# Patient Record
Sex: Female | Born: 1940 | Race: White | Hispanic: No | Smoking: Former smoker
Health system: Southern US, Community
[De-identification: ages and names within clinical notes are randomized; demographics above are authoritative.]

## PROBLEM LIST (undated history)

## (undated) DIAGNOSIS — S8490XA Injury of unspecified nerve at lower leg level, unspecified leg, initial encounter: Secondary | ICD-10-CM

## (undated) DIAGNOSIS — S129XXA Fracture of neck, unspecified, initial encounter: Secondary | ICD-10-CM

## (undated) DIAGNOSIS — M899 Disorder of bone, unspecified: Secondary | ICD-10-CM

## (undated) DIAGNOSIS — M75102 Unspecified rotator cuff tear or rupture of left shoulder, not specified as traumatic: Secondary | ICD-10-CM

## (undated) DIAGNOSIS — M1990 Unspecified osteoarthritis, unspecified site: Secondary | ICD-10-CM

## (undated) DIAGNOSIS — M949 Disorder of cartilage, unspecified: Secondary | ICD-10-CM

## (undated) DIAGNOSIS — S2239XA Fracture of one rib, unspecified side, initial encounter for closed fracture: Secondary | ICD-10-CM

## (undated) DIAGNOSIS — E785 Hyperlipidemia, unspecified: Secondary | ICD-10-CM

## (undated) DIAGNOSIS — Z86010 Personal history of colonic polyps: Secondary | ICD-10-CM

## (undated) DIAGNOSIS — N3010 Interstitial cystitis (chronic) without hematuria: Secondary | ICD-10-CM

## (undated) DIAGNOSIS — I609 Nontraumatic subarachnoid hemorrhage, unspecified: Secondary | ICD-10-CM

## (undated) DIAGNOSIS — S329XXA Fracture of unspecified parts of lumbosacral spine and pelvis, initial encounter for closed fracture: Secondary | ICD-10-CM

## (undated) DIAGNOSIS — F32A Depression, unspecified: Secondary | ICD-10-CM

## (undated) DIAGNOSIS — Z8669 Personal history of other diseases of the nervous system and sense organs: Secondary | ICD-10-CM

## (undated) DIAGNOSIS — K219 Gastro-esophageal reflux disease without esophagitis: Secondary | ICD-10-CM

## (undated) DIAGNOSIS — G4733 Obstructive sleep apnea (adult) (pediatric): Secondary | ICD-10-CM

## (undated) DIAGNOSIS — M12812 Other specific arthropathies, not elsewhere classified, left shoulder: Secondary | ICD-10-CM

## (undated) DIAGNOSIS — I739 Peripheral vascular disease, unspecified: Secondary | ICD-10-CM

## (undated) DIAGNOSIS — I10 Essential (primary) hypertension: Secondary | ICD-10-CM

## (undated) DIAGNOSIS — F5104 Psychophysiologic insomnia: Secondary | ICD-10-CM

## (undated) DIAGNOSIS — J309 Allergic rhinitis, unspecified: Secondary | ICD-10-CM

## (undated) HISTORY — DX: Injury of unspecified nerve at lower leg level, unspecified leg, initial encounter: S84.90XA

## (undated) HISTORY — PX: BACK SURGERY: SHX140

## (undated) HISTORY — DX: Hyperlipidemia, unspecified: E78.5

## (undated) HISTORY — PX: LUMBAR FUSION: SHX111

## (undated) HISTORY — DX: Unspecified osteoarthritis, unspecified site: M19.90

## (undated) HISTORY — PX: TONSILLECTOMY: SUR1361

## (undated) HISTORY — PX: ANKLE FUSION: SHX881

## (undated) HISTORY — DX: Fracture of one rib, unspecified side, initial encounter for closed fracture: S22.39XA

## (undated) HISTORY — DX: Psychophysiologic insomnia: F51.04

## (undated) HISTORY — DX: Allergic rhinitis, unspecified: J30.9

## (undated) HISTORY — DX: Essential (primary) hypertension: I10

## (undated) HISTORY — PX: TIBIA FRACTURE SURGERY: SHX806

## (undated) HISTORY — DX: Fracture of unspecified parts of lumbosacral spine and pelvis, initial encounter for closed fracture: S32.9XXA

## (undated) HISTORY — DX: Disorder of bone, unspecified: M89.9

## (undated) HISTORY — DX: Peripheral vascular disease, unspecified: I73.9

## (undated) HISTORY — DX: Personal history of other diseases of the nervous system and sense organs: Z86.69

## (undated) HISTORY — PX: HERNIA REPAIR: SHX51

## (undated) HISTORY — DX: Personal history of colonic polyps: Z86.010

## (undated) HISTORY — DX: Gastro-esophageal reflux disease without esophagitis: K21.9

## (undated) HISTORY — PX: CYSTOSCOPY: SUR368

## (undated) HISTORY — PX: CERVICAL FUSION: SHX112

## (undated) HISTORY — PX: UPPER GASTROINTESTINAL ENDOSCOPY: SHX188

## (undated) HISTORY — DX: Disorder of cartilage, unspecified: M94.9

## (undated) HISTORY — PX: COLONOSCOPY: SHX174

## (undated) HISTORY — DX: Interstitial cystitis (chronic) without hematuria: N30.10

## (undated) HISTORY — PX: TUBAL LIGATION: SHX77

## (undated) HISTORY — DX: Obstructive sleep apnea (adult) (pediatric): G47.33

---

## 1997-07-15 ENCOUNTER — Ambulatory Visit (HOSPITAL_COMMUNITY): Admission: RE | Admit: 1997-07-15 | Discharge: 1997-07-15 | Payer: Self-pay | Admitting: *Deleted

## 1998-01-11 ENCOUNTER — Ambulatory Visit (HOSPITAL_COMMUNITY): Admission: RE | Admit: 1998-01-11 | Discharge: 1998-01-11 | Payer: Self-pay | Admitting: *Deleted

## 1998-10-27 ENCOUNTER — Other Ambulatory Visit: Admission: RE | Admit: 1998-10-27 | Discharge: 1998-10-27 | Payer: Self-pay | Admitting: *Deleted

## 2000-04-18 ENCOUNTER — Other Ambulatory Visit: Admission: RE | Admit: 2000-04-18 | Discharge: 2000-04-18 | Payer: Self-pay | Admitting: *Deleted

## 2002-01-07 ENCOUNTER — Ambulatory Visit (HOSPITAL_BASED_OUTPATIENT_CLINIC_OR_DEPARTMENT_OTHER): Admission: RE | Admit: 2002-01-07 | Discharge: 2002-01-07 | Payer: Self-pay | Admitting: Internal Medicine

## 2002-02-12 ENCOUNTER — Inpatient Hospital Stay (HOSPITAL_COMMUNITY): Admission: EM | Admit: 2002-02-12 | Discharge: 2002-02-16 | Payer: Self-pay | Admitting: Emergency Medicine

## 2002-02-13 ENCOUNTER — Encounter: Payer: Self-pay | Admitting: Orthopedic Surgery

## 2002-10-01 ENCOUNTER — Ambulatory Visit (HOSPITAL_COMMUNITY): Admission: RE | Admit: 2002-10-01 | Discharge: 2002-10-01 | Payer: Self-pay | Admitting: Gastroenterology

## 2003-01-12 ENCOUNTER — Encounter: Admission: RE | Admit: 2003-01-12 | Discharge: 2003-01-12 | Payer: Self-pay | Admitting: Urology

## 2003-01-15 ENCOUNTER — Encounter (INDEPENDENT_AMBULATORY_CARE_PROVIDER_SITE_OTHER): Payer: Self-pay | Admitting: Specialist

## 2003-01-15 ENCOUNTER — Ambulatory Visit (HOSPITAL_BASED_OUTPATIENT_CLINIC_OR_DEPARTMENT_OTHER): Admission: RE | Admit: 2003-01-15 | Discharge: 2003-01-15 | Payer: Self-pay | Admitting: Urology

## 2003-01-15 ENCOUNTER — Ambulatory Visit (HOSPITAL_COMMUNITY): Admission: RE | Admit: 2003-01-15 | Discharge: 2003-01-15 | Payer: Self-pay | Admitting: Urology

## 2003-02-03 ENCOUNTER — Encounter: Payer: Self-pay | Admitting: Internal Medicine

## 2003-11-15 ENCOUNTER — Encounter (INDEPENDENT_AMBULATORY_CARE_PROVIDER_SITE_OTHER): Payer: Self-pay | Admitting: *Deleted

## 2004-01-03 ENCOUNTER — Ambulatory Visit: Payer: Self-pay | Admitting: Internal Medicine

## 2004-01-04 ENCOUNTER — Ambulatory Visit: Payer: Self-pay | Admitting: Internal Medicine

## 2004-02-25 ENCOUNTER — Ambulatory Visit: Payer: Self-pay | Admitting: Internal Medicine

## 2004-04-11 ENCOUNTER — Ambulatory Visit: Payer: Self-pay | Admitting: Internal Medicine

## 2004-07-13 ENCOUNTER — Ambulatory Visit: Payer: Self-pay | Admitting: Internal Medicine

## 2004-07-14 ENCOUNTER — Ambulatory Visit: Payer: Self-pay | Admitting: Internal Medicine

## 2004-09-22 ENCOUNTER — Inpatient Hospital Stay (HOSPITAL_COMMUNITY): Admission: EM | Admit: 2004-09-22 | Discharge: 2004-09-30 | Payer: Self-pay | Admitting: Emergency Medicine

## 2004-09-25 ENCOUNTER — Ambulatory Visit: Payer: Self-pay | Admitting: Internal Medicine

## 2004-09-30 ENCOUNTER — Inpatient Hospital Stay: Admission: RE | Admit: 2004-09-30 | Discharge: 2004-10-06 | Payer: Self-pay | Admitting: Internal Medicine

## 2004-10-18 ENCOUNTER — Ambulatory Visit: Payer: Self-pay | Admitting: Internal Medicine

## 2004-10-30 ENCOUNTER — Ambulatory Visit: Payer: Self-pay | Admitting: Internal Medicine

## 2004-11-15 ENCOUNTER — Ambulatory Visit: Payer: Self-pay | Admitting: Internal Medicine

## 2004-11-24 ENCOUNTER — Ambulatory Visit: Payer: Self-pay | Admitting: Internal Medicine

## 2005-03-02 ENCOUNTER — Ambulatory Visit: Payer: Self-pay | Admitting: Internal Medicine

## 2005-06-13 LAB — HM MAMMOGRAPHY: HM Mammogram: NORMAL

## 2005-06-21 ENCOUNTER — Ambulatory Visit: Payer: Self-pay | Admitting: Internal Medicine

## 2005-07-12 ENCOUNTER — Ambulatory Visit: Payer: Self-pay | Admitting: Internal Medicine

## 2005-08-14 ENCOUNTER — Inpatient Hospital Stay (HOSPITAL_COMMUNITY): Admission: EM | Admit: 2005-08-14 | Discharge: 2005-08-20 | Payer: Self-pay | Admitting: Emergency Medicine

## 2005-08-14 IMAGING — CR DG KNEE COMPLETE 4+V*L*
4 series · 4 of 4 positions shown · non-contrast
Comparison: None.

[DATE] ? DUPLICATE COPY for exam association in RIS ? No change from original report.
CLINICAL DATA: Fell ? right shoulder pain.
 CHEST ? 1 VIEW:
CLINICAL DATA: Fell ? bruising of foot and toes.
CLINICAL DATA: Fell ? pain and swelling medial knee.

[t knee ap left]
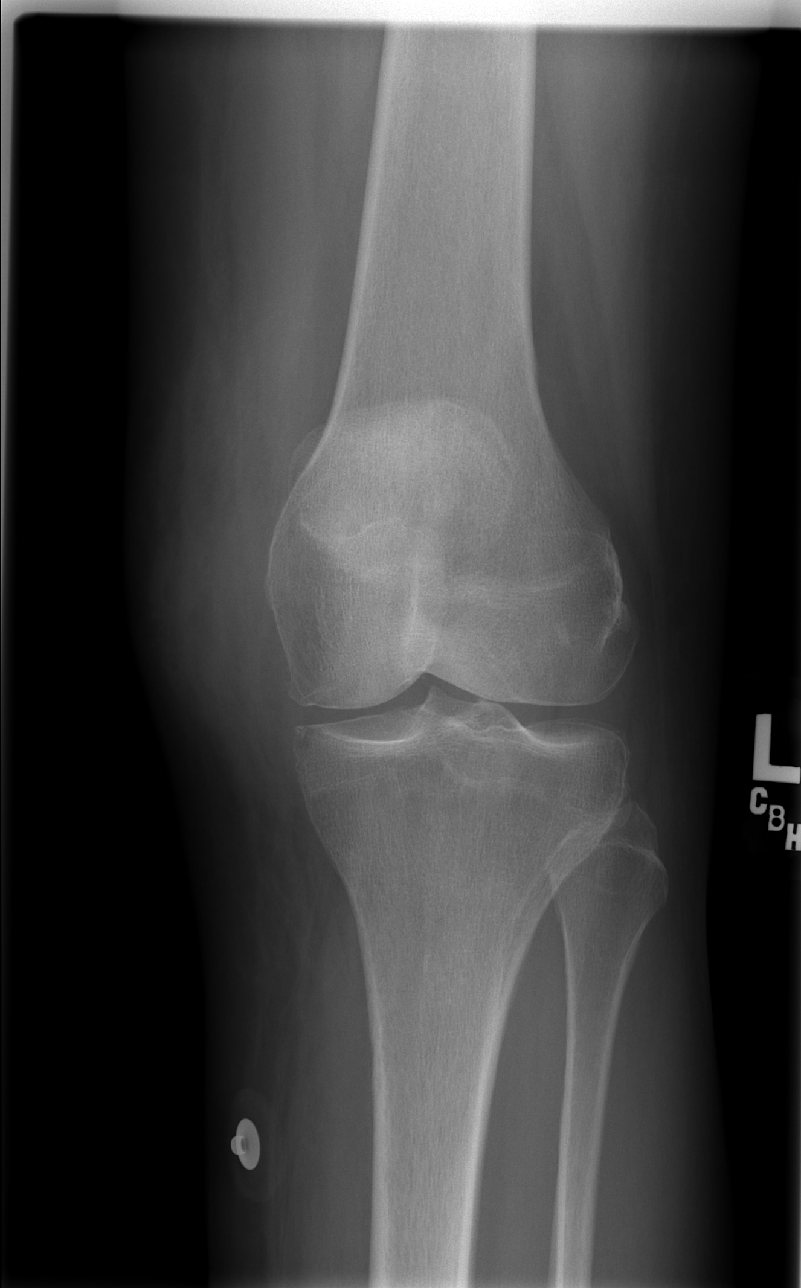

[t knee oblique left (1 of 2)]
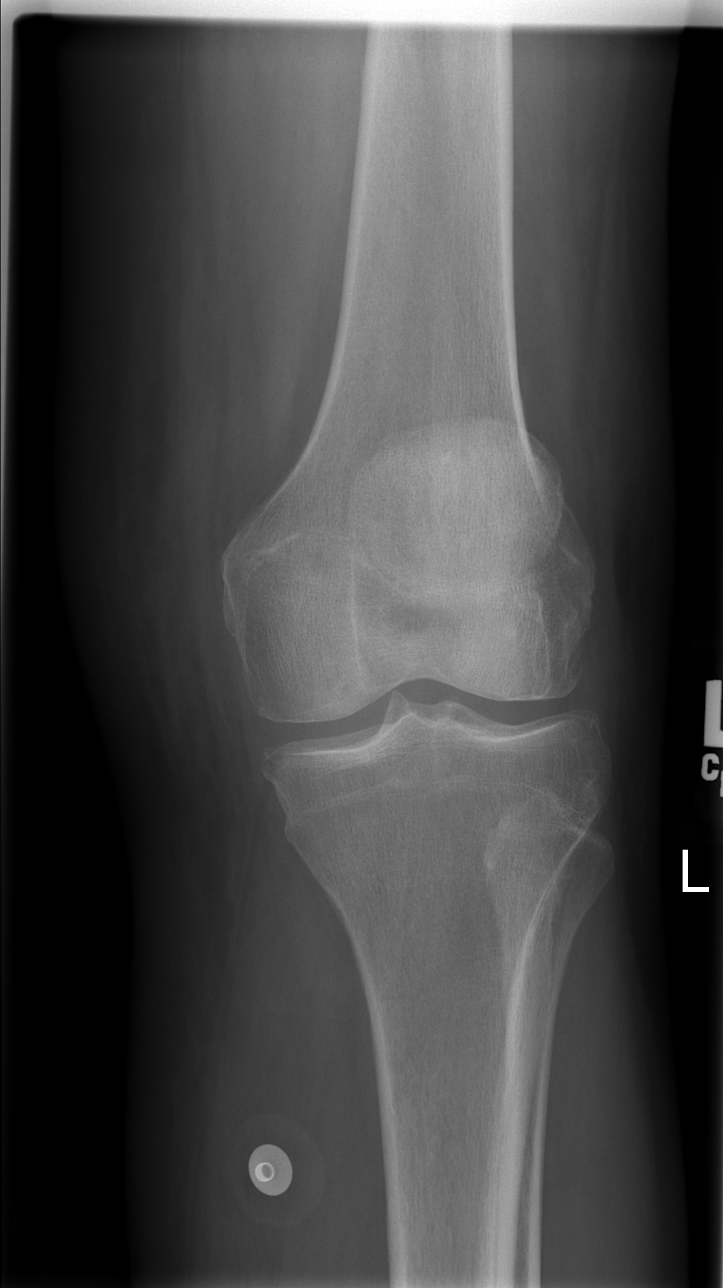

[t knee oblique left (2 of 2)]
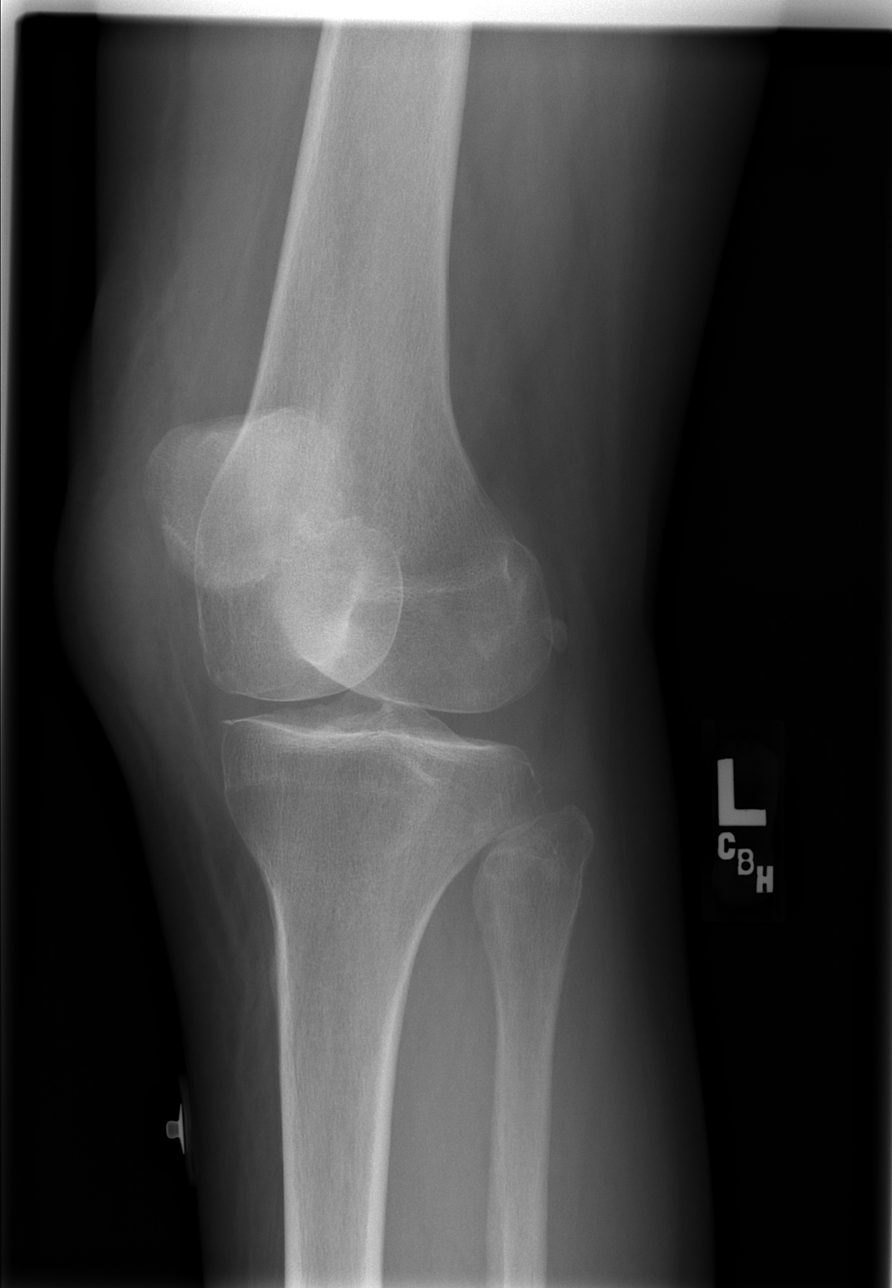

[t knee lat left]
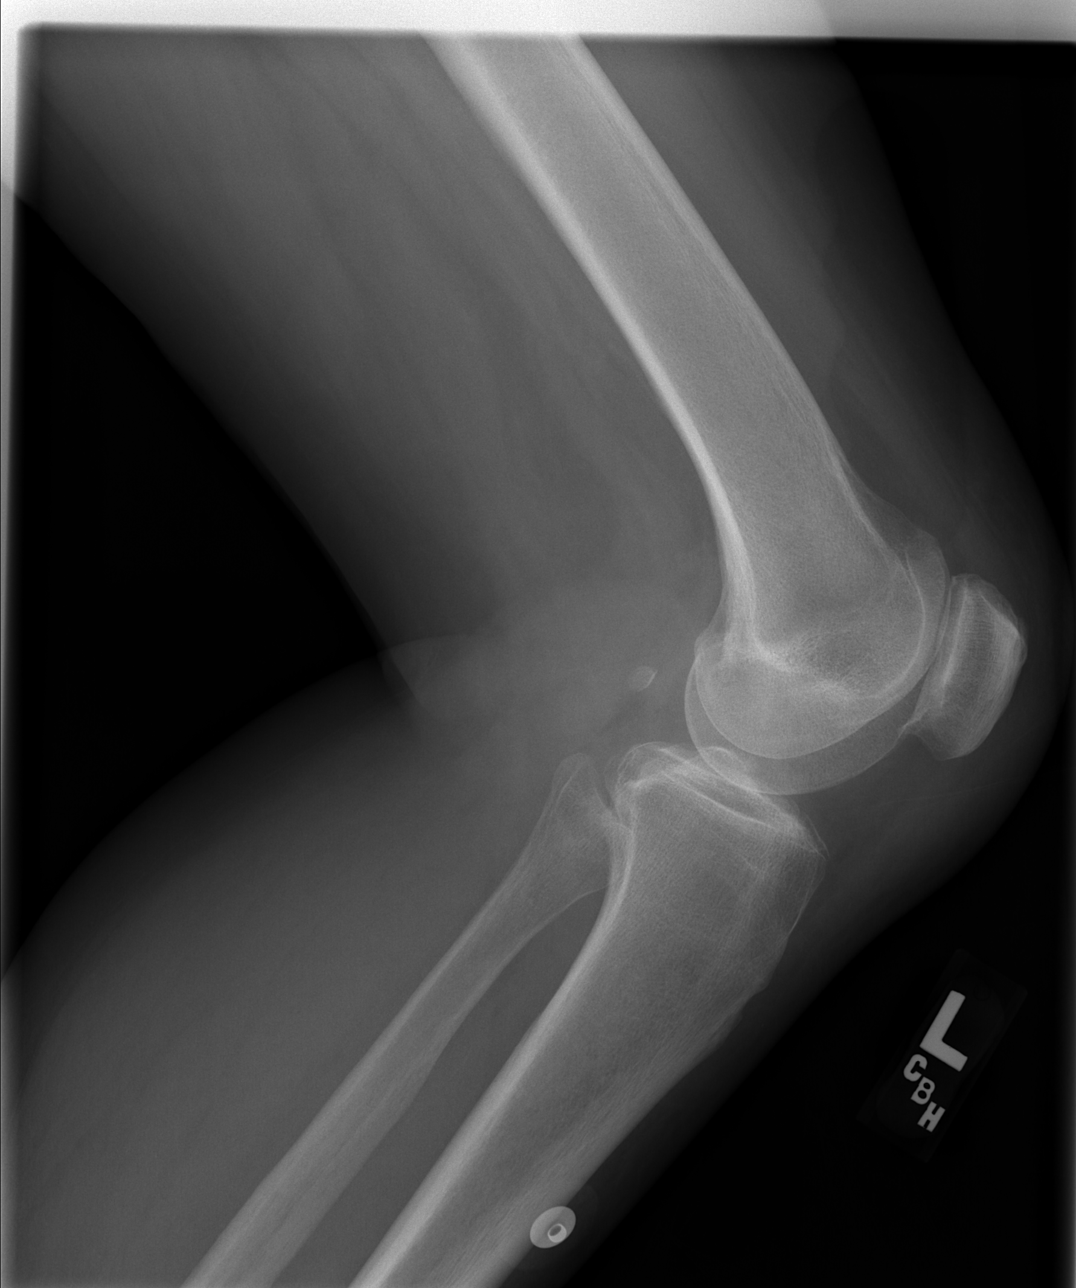

[4 of 4 positions shown; findings below may reference images not displayed]

FINDINGS: Heart and mediastinum normal.  There are nondisplaced rib fractures on the right of the lateral fourth and fifth ribs.  There is also a fracture of the right anterior fifth rib.  No definite pneumothorax or hemothorax.
IMPRESSION: Nondisplaced right rib fractures ? no pneumothorax or active disease.
 RIGHT FOOT ? 3 VIEW:
FINDINGS: There is a fracture of the terminal tuft of the distal phalanx of the great toe.  No other acute changes.  There are some degenerative changes.
IMPRESSION: Fracture of the tuft of the great toe.  
 LEFT KNEE - 4 VIEW:
FINDINGS: There is some soft tissue swelling medially.  No definite fracture, dislocation, or joint effusion.  
 There is, however, an area of cortical discontinuity along the medial tibial plateau anterolaterally.  Cannot rule out an occult fracture, although I think it is unlikely because there is no joint effusion.  However, this is near the area where there is soft tissue swelling. 
 This needs careful correlation.  CT might be necessary if further assessment is warranted.
IMPRESSION: 1.  Medial soft tissue swelling.  
 2.  No definite fracture, but there is an area of cortical discontinuity along the anteromedial tibial plateau.  See above.

## 2005-08-16 ENCOUNTER — Ambulatory Visit: Payer: Self-pay | Admitting: Physical Medicine & Rehabilitation

## 2005-09-14 ENCOUNTER — Ambulatory Visit: Payer: Self-pay | Admitting: Internal Medicine

## 2005-09-28 ENCOUNTER — Ambulatory Visit: Payer: Self-pay | Admitting: Internal Medicine

## 2005-10-04 ENCOUNTER — Ambulatory Visit: Payer: Self-pay | Admitting: Internal Medicine

## 2005-10-18 ENCOUNTER — Ambulatory Visit: Payer: Self-pay | Admitting: Internal Medicine

## 2005-12-25 ENCOUNTER — Ambulatory Visit: Payer: Self-pay | Admitting: Family Medicine

## 2005-12-25 LAB — CONVERTED CEMR LAB
ALT: 26 units/L (ref 0–40)
AST: 30 units/L (ref 0–37)
BUN: 14 mg/dL (ref 6–23)
Creatinine, Ser: 1 mg/dL (ref 0.4–1.2)
VLDL: 8 mg/dL (ref 0–40)

## 2006-04-02 ENCOUNTER — Ambulatory Visit: Payer: Self-pay | Admitting: Internal Medicine

## 2006-04-02 LAB — CONVERTED CEMR LAB
BUN: 10 mg/dL (ref 6–23)
Potassium: 4.4 meq/L (ref 3.5–5.1)
Total CK: 40 units/L (ref 7–177)

## 2006-04-03 ENCOUNTER — Encounter: Payer: Self-pay | Admitting: Internal Medicine

## 2006-04-03 LAB — CONVERTED CEMR LAB: Vit D, 1,25-Dihydroxy: 47 (ref 20–57)

## 2006-04-10 ENCOUNTER — Ambulatory Visit: Payer: Self-pay | Admitting: Internal Medicine

## 2006-06-13 ENCOUNTER — Ambulatory Visit: Payer: Self-pay | Admitting: Internal Medicine

## 2006-07-01 DIAGNOSIS — I10 Essential (primary) hypertension: Secondary | ICD-10-CM | POA: Insufficient documentation

## 2006-07-01 DIAGNOSIS — J3089 Other allergic rhinitis: Secondary | ICD-10-CM | POA: Insufficient documentation

## 2006-07-01 DIAGNOSIS — J302 Other seasonal allergic rhinitis: Secondary | ICD-10-CM

## 2006-07-01 DIAGNOSIS — Z8669 Personal history of other diseases of the nervous system and sense organs: Secondary | ICD-10-CM | POA: Insufficient documentation

## 2006-07-01 DIAGNOSIS — M949 Disorder of cartilage, unspecified: Secondary | ICD-10-CM

## 2006-07-01 DIAGNOSIS — Z9089 Acquired absence of other organs: Secondary | ICD-10-CM | POA: Insufficient documentation

## 2006-07-01 DIAGNOSIS — S329XXA Fracture of unspecified parts of lumbosacral spine and pelvis, initial encounter for closed fracture: Secondary | ICD-10-CM | POA: Insufficient documentation

## 2006-07-01 DIAGNOSIS — M899 Disorder of bone, unspecified: Secondary | ICD-10-CM | POA: Insufficient documentation

## 2006-07-01 DIAGNOSIS — N3010 Interstitial cystitis (chronic) without hematuria: Secondary | ICD-10-CM | POA: Insufficient documentation

## 2006-08-12 ENCOUNTER — Telehealth: Payer: Self-pay | Admitting: Internal Medicine

## 2006-08-15 ENCOUNTER — Ambulatory Visit: Payer: Self-pay | Admitting: Internal Medicine

## 2006-08-16 ENCOUNTER — Inpatient Hospital Stay (HOSPITAL_COMMUNITY): Admission: EM | Admit: 2006-08-16 | Discharge: 2006-08-21 | Payer: Self-pay | Admitting: Emergency Medicine

## 2006-08-19 ENCOUNTER — Ambulatory Visit: Payer: Self-pay | Admitting: Physical Medicine & Rehabilitation

## 2006-09-09 ENCOUNTER — Encounter: Payer: Self-pay | Admitting: Internal Medicine

## 2006-10-08 ENCOUNTER — Encounter: Admission: RE | Admit: 2006-10-08 | Discharge: 2006-12-04 | Payer: Self-pay | Admitting: Orthopedic Surgery

## 2006-11-26 ENCOUNTER — Ambulatory Visit: Payer: Self-pay | Admitting: Internal Medicine

## 2006-12-02 ENCOUNTER — Encounter (INDEPENDENT_AMBULATORY_CARE_PROVIDER_SITE_OTHER): Payer: Self-pay | Admitting: *Deleted

## 2006-12-09 ENCOUNTER — Ambulatory Visit: Payer: Self-pay | Admitting: Internal Medicine

## 2007-01-07 ENCOUNTER — Telehealth (INDEPENDENT_AMBULATORY_CARE_PROVIDER_SITE_OTHER): Payer: Self-pay | Admitting: *Deleted

## 2007-01-13 ENCOUNTER — Telehealth (INDEPENDENT_AMBULATORY_CARE_PROVIDER_SITE_OTHER): Payer: Self-pay | Admitting: *Deleted

## 2007-01-15 ENCOUNTER — Encounter (INDEPENDENT_AMBULATORY_CARE_PROVIDER_SITE_OTHER): Payer: Self-pay | Admitting: *Deleted

## 2007-01-15 DIAGNOSIS — M1990 Unspecified osteoarthritis, unspecified site: Secondary | ICD-10-CM | POA: Insufficient documentation

## 2007-01-15 DIAGNOSIS — K219 Gastro-esophageal reflux disease without esophagitis: Secondary | ICD-10-CM | POA: Insufficient documentation

## 2007-01-24 ENCOUNTER — Telehealth (INDEPENDENT_AMBULATORY_CARE_PROVIDER_SITE_OTHER): Payer: Self-pay | Admitting: *Deleted

## 2007-02-18 ENCOUNTER — Telehealth (INDEPENDENT_AMBULATORY_CARE_PROVIDER_SITE_OTHER): Payer: Self-pay | Admitting: *Deleted

## 2007-02-18 ENCOUNTER — Encounter: Payer: Self-pay | Admitting: Internal Medicine

## 2007-02-26 ENCOUNTER — Telehealth (INDEPENDENT_AMBULATORY_CARE_PROVIDER_SITE_OTHER): Payer: Self-pay | Admitting: *Deleted

## 2007-03-04 ENCOUNTER — Telehealth (INDEPENDENT_AMBULATORY_CARE_PROVIDER_SITE_OTHER): Payer: Self-pay | Admitting: *Deleted

## 2007-03-12 ENCOUNTER — Ambulatory Visit: Payer: Self-pay | Admitting: Internal Medicine

## 2007-04-15 ENCOUNTER — Telehealth (INDEPENDENT_AMBULATORY_CARE_PROVIDER_SITE_OTHER): Payer: Self-pay | Admitting: *Deleted

## 2007-05-20 ENCOUNTER — Telehealth (INDEPENDENT_AMBULATORY_CARE_PROVIDER_SITE_OTHER): Payer: Self-pay | Admitting: *Deleted

## 2007-05-27 ENCOUNTER — Telehealth (INDEPENDENT_AMBULATORY_CARE_PROVIDER_SITE_OTHER): Payer: Self-pay | Admitting: *Deleted

## 2007-06-09 ENCOUNTER — Telehealth: Payer: Self-pay | Admitting: Internal Medicine

## 2007-06-11 ENCOUNTER — Telehealth (INDEPENDENT_AMBULATORY_CARE_PROVIDER_SITE_OTHER): Payer: Self-pay | Admitting: *Deleted

## 2007-06-12 ENCOUNTER — Telehealth (INDEPENDENT_AMBULATORY_CARE_PROVIDER_SITE_OTHER): Payer: Self-pay | Admitting: *Deleted

## 2007-06-13 ENCOUNTER — Ambulatory Visit: Payer: Self-pay | Admitting: Internal Medicine

## 2007-06-13 ENCOUNTER — Emergency Department (HOSPITAL_COMMUNITY): Admission: EM | Admit: 2007-06-13 | Discharge: 2007-06-13 | Payer: Self-pay | Admitting: Emergency Medicine

## 2007-06-23 ENCOUNTER — Ambulatory Visit: Payer: Self-pay | Admitting: Internal Medicine

## 2007-06-24 ENCOUNTER — Telehealth: Payer: Self-pay | Admitting: Internal Medicine

## 2007-06-25 LAB — CONVERTED CEMR LAB
Alkaline Phosphatase: 71 units/L (ref 39–117)
Basophils Absolute: 0 10*3/uL (ref 0.0–0.1)
Bilirubin, Direct: 0.1 mg/dL (ref 0.0–0.3)
Calcium: 9.3 mg/dL (ref 8.4–10.5)
Cholesterol: 183 mg/dL (ref 0–200)
GFR calc Af Amer: 108 mL/min
GFR calc non Af Amer: 89 mL/min
Glucose, Bld: 102 mg/dL — ABNORMAL HIGH (ref 70–99)
HCT: 36.9 % (ref 36.0–46.0)
HDL: 43.4 mg/dL (ref >39.0)
LDL Cholesterol: 127 mg/dL — ABNORMAL HIGH (ref 0–99)
Lymphocytes Relative: 33.7 % (ref 12.0–46.0)
MCHC: 33.2 g/dL (ref 30.0–36.0)
Monocytes Absolute: 0.4 10*3/uL (ref 0.1–1.0)
Monocytes Relative: 7.1 % (ref 3.0–12.0)
Platelets: 346 10*3/uL (ref 150–400)
Potassium: 4.1 meq/L (ref 3.5–5.1)
RDW: 12.4 % (ref 11.5–14.6)
Sodium: 138 meq/L (ref 135–145)
Total Bilirubin: 0.7 mg/dL (ref 0.3–1.2)
Total CHOL/HDL Ratio: 4.2
Total Protein: 6.5 g/dL (ref 6.0–8.3)
Triglycerides: 61 mg/dL (ref 0–149)

## 2007-06-26 ENCOUNTER — Encounter (INDEPENDENT_AMBULATORY_CARE_PROVIDER_SITE_OTHER): Payer: Self-pay | Admitting: *Deleted

## 2007-07-10 ENCOUNTER — Telehealth (INDEPENDENT_AMBULATORY_CARE_PROVIDER_SITE_OTHER): Payer: Self-pay | Admitting: *Deleted

## 2007-07-14 ENCOUNTER — Encounter: Payer: Self-pay | Admitting: Internal Medicine

## 2007-07-15 ENCOUNTER — Ambulatory Visit: Payer: Self-pay | Admitting: Internal Medicine

## 2007-07-15 DIAGNOSIS — G4700 Insomnia, unspecified: Secondary | ICD-10-CM | POA: Insufficient documentation

## 2007-07-16 ENCOUNTER — Ambulatory Visit: Payer: Self-pay | Admitting: Internal Medicine

## 2007-07-21 ENCOUNTER — Encounter: Payer: Self-pay | Admitting: Internal Medicine

## 2007-07-23 ENCOUNTER — Encounter: Payer: Self-pay | Admitting: Internal Medicine

## 2007-07-29 ENCOUNTER — Ambulatory Visit: Payer: Self-pay | Admitting: Internal Medicine

## 2007-07-29 DIAGNOSIS — R9431 Abnormal electrocardiogram [ECG] [EKG]: Secondary | ICD-10-CM | POA: Insufficient documentation

## 2007-07-29 DIAGNOSIS — R7989 Other specified abnormal findings of blood chemistry: Secondary | ICD-10-CM | POA: Insufficient documentation

## 2007-07-29 DIAGNOSIS — E785 Hyperlipidemia, unspecified: Secondary | ICD-10-CM | POA: Insufficient documentation

## 2007-08-12 ENCOUNTER — Encounter (INDEPENDENT_AMBULATORY_CARE_PROVIDER_SITE_OTHER): Payer: Self-pay | Admitting: *Deleted

## 2007-08-20 ENCOUNTER — Telehealth (INDEPENDENT_AMBULATORY_CARE_PROVIDER_SITE_OTHER): Payer: Self-pay | Admitting: *Deleted

## 2007-08-29 ENCOUNTER — Telehealth (INDEPENDENT_AMBULATORY_CARE_PROVIDER_SITE_OTHER): Payer: Self-pay | Admitting: *Deleted

## 2007-09-05 ENCOUNTER — Telehealth (INDEPENDENT_AMBULATORY_CARE_PROVIDER_SITE_OTHER): Payer: Self-pay | Admitting: *Deleted

## 2007-10-03 ENCOUNTER — Telehealth (INDEPENDENT_AMBULATORY_CARE_PROVIDER_SITE_OTHER): Payer: Self-pay | Admitting: *Deleted

## 2007-10-10 ENCOUNTER — Encounter: Payer: Self-pay | Admitting: Internal Medicine

## 2007-10-10 DIAGNOSIS — R269 Unspecified abnormalities of gait and mobility: Secondary | ICD-10-CM | POA: Insufficient documentation

## 2007-10-10 DIAGNOSIS — M6281 Muscle weakness (generalized): Secondary | ICD-10-CM | POA: Insufficient documentation

## 2007-11-10 ENCOUNTER — Telehealth (INDEPENDENT_AMBULATORY_CARE_PROVIDER_SITE_OTHER): Payer: Self-pay | Admitting: *Deleted

## 2007-11-19 ENCOUNTER — Encounter (INDEPENDENT_AMBULATORY_CARE_PROVIDER_SITE_OTHER): Payer: Self-pay | Admitting: *Deleted

## 2007-11-24 ENCOUNTER — Telehealth (INDEPENDENT_AMBULATORY_CARE_PROVIDER_SITE_OTHER): Payer: Self-pay | Admitting: *Deleted

## 2007-12-04 ENCOUNTER — Telehealth (INDEPENDENT_AMBULATORY_CARE_PROVIDER_SITE_OTHER): Payer: Self-pay | Admitting: *Deleted

## 2007-12-08 ENCOUNTER — Ambulatory Visit: Payer: Self-pay | Admitting: Internal Medicine

## 2008-01-01 ENCOUNTER — Telehealth: Payer: Self-pay | Admitting: Internal Medicine

## 2008-01-02 ENCOUNTER — Telehealth (INDEPENDENT_AMBULATORY_CARE_PROVIDER_SITE_OTHER): Payer: Self-pay | Admitting: *Deleted

## 2008-03-05 ENCOUNTER — Telehealth (INDEPENDENT_AMBULATORY_CARE_PROVIDER_SITE_OTHER): Payer: Self-pay | Admitting: *Deleted

## 2008-03-16 ENCOUNTER — Telehealth (INDEPENDENT_AMBULATORY_CARE_PROVIDER_SITE_OTHER): Payer: Self-pay | Admitting: *Deleted

## 2008-03-31 ENCOUNTER — Ambulatory Visit: Payer: Self-pay | Admitting: Internal Medicine

## 2008-05-17 ENCOUNTER — Telehealth: Payer: Self-pay | Admitting: Internal Medicine

## 2008-06-07 ENCOUNTER — Ambulatory Visit: Payer: Self-pay | Admitting: Family Medicine

## 2008-06-07 DIAGNOSIS — M549 Dorsalgia, unspecified: Secondary | ICD-10-CM | POA: Insufficient documentation

## 2008-06-07 LAB — CONVERTED CEMR LAB
Bilirubin Urine: NEGATIVE
Glucose, Urine, Semiquant: NEGATIVE
Ketones, urine, test strip: NEGATIVE
Specific Gravity, Urine: 1.015
pH: 6

## 2008-06-08 ENCOUNTER — Encounter: Payer: Self-pay | Admitting: Internal Medicine

## 2008-06-09 ENCOUNTER — Encounter: Payer: Self-pay | Admitting: Family Medicine

## 2008-06-09 LAB — CONVERTED CEMR LAB

## 2008-06-15 ENCOUNTER — Encounter (INDEPENDENT_AMBULATORY_CARE_PROVIDER_SITE_OTHER): Payer: Self-pay | Admitting: *Deleted

## 2008-06-18 ENCOUNTER — Encounter: Payer: Self-pay | Admitting: Internal Medicine

## 2008-06-23 ENCOUNTER — Encounter: Payer: Self-pay | Admitting: Family Medicine

## 2008-06-23 ENCOUNTER — Encounter: Payer: Self-pay | Admitting: Internal Medicine

## 2008-06-28 ENCOUNTER — Telehealth (INDEPENDENT_AMBULATORY_CARE_PROVIDER_SITE_OTHER): Payer: Self-pay | Admitting: *Deleted

## 2008-07-05 ENCOUNTER — Encounter: Payer: Self-pay | Admitting: Internal Medicine

## 2008-07-20 ENCOUNTER — Telehealth (INDEPENDENT_AMBULATORY_CARE_PROVIDER_SITE_OTHER): Payer: Self-pay | Admitting: *Deleted

## 2008-07-23 ENCOUNTER — Ambulatory Visit: Payer: Self-pay | Admitting: Internal Medicine

## 2008-08-03 ENCOUNTER — Encounter: Payer: Self-pay | Admitting: Internal Medicine

## 2008-08-03 ENCOUNTER — Telehealth (INDEPENDENT_AMBULATORY_CARE_PROVIDER_SITE_OTHER): Payer: Self-pay | Admitting: *Deleted

## 2008-08-12 ENCOUNTER — Telehealth (INDEPENDENT_AMBULATORY_CARE_PROVIDER_SITE_OTHER): Payer: Self-pay | Admitting: *Deleted

## 2008-08-24 ENCOUNTER — Telehealth (INDEPENDENT_AMBULATORY_CARE_PROVIDER_SITE_OTHER): Payer: Self-pay | Admitting: *Deleted

## 2008-08-26 ENCOUNTER — Ambulatory Visit: Payer: Self-pay | Admitting: Internal Medicine

## 2008-09-15 ENCOUNTER — Encounter: Payer: Self-pay | Admitting: Internal Medicine

## 2008-09-21 ENCOUNTER — Encounter: Payer: Self-pay | Admitting: Internal Medicine

## 2008-09-22 ENCOUNTER — Encounter: Payer: Self-pay | Admitting: Internal Medicine

## 2008-10-04 ENCOUNTER — Telehealth (INDEPENDENT_AMBULATORY_CARE_PROVIDER_SITE_OTHER): Payer: Self-pay | Admitting: *Deleted

## 2008-10-06 ENCOUNTER — Telehealth (INDEPENDENT_AMBULATORY_CARE_PROVIDER_SITE_OTHER): Payer: Self-pay | Admitting: *Deleted

## 2008-10-22 ENCOUNTER — Telehealth (INDEPENDENT_AMBULATORY_CARE_PROVIDER_SITE_OTHER): Payer: Self-pay | Admitting: *Deleted

## 2008-11-02 ENCOUNTER — Telehealth (INDEPENDENT_AMBULATORY_CARE_PROVIDER_SITE_OTHER): Payer: Self-pay | Admitting: *Deleted

## 2008-11-04 ENCOUNTER — Encounter: Payer: Self-pay | Admitting: Internal Medicine

## 2008-11-15 ENCOUNTER — Ambulatory Visit: Payer: Self-pay | Admitting: Internal Medicine

## 2008-11-24 ENCOUNTER — Telehealth (INDEPENDENT_AMBULATORY_CARE_PROVIDER_SITE_OTHER): Payer: Self-pay | Admitting: *Deleted

## 2008-11-24 ENCOUNTER — Encounter: Payer: Self-pay | Admitting: Internal Medicine

## 2008-11-29 ENCOUNTER — Telehealth (INDEPENDENT_AMBULATORY_CARE_PROVIDER_SITE_OTHER): Payer: Self-pay | Admitting: *Deleted

## 2008-12-01 ENCOUNTER — Encounter: Payer: Self-pay | Admitting: Internal Medicine

## 2008-12-14 ENCOUNTER — Encounter (INDEPENDENT_AMBULATORY_CARE_PROVIDER_SITE_OTHER): Payer: Self-pay | Admitting: *Deleted

## 2009-01-11 ENCOUNTER — Ambulatory Visit: Payer: Self-pay | Admitting: Internal Medicine

## 2009-01-18 ENCOUNTER — Encounter (INDEPENDENT_AMBULATORY_CARE_PROVIDER_SITE_OTHER): Payer: Self-pay | Admitting: *Deleted

## 2009-01-18 LAB — CONVERTED CEMR LAB
AST: 43 units/L — ABNORMAL HIGH (ref 0–37)
Albumin: 4.6 g/dL (ref 3.5–5.2)
Alkaline Phosphatase: 49 units/L (ref 39–117)
Basophils Relative: 1.2 % (ref 0.0–3.0)
CO2: 27 meq/L (ref 19–32)
Calcium: 9.6 mg/dL (ref 8.4–10.5)
Eosinophils Absolute: 0.1 10*3/uL (ref 0.0–0.7)
Glucose, Bld: 106 mg/dL — ABNORMAL HIGH (ref 70–99)
HCT: 39.5 % (ref 36.0–46.0)
Hemoglobin: 13.2 g/dL (ref 12.0–15.0)
Lymphocytes Relative: 41.1 % (ref 12.0–46.0)
Lymphs Abs: 1.9 10*3/uL (ref 0.7–4.0)
MCHC: 33.4 g/dL (ref 30.0–36.0)
Monocytes Relative: 16.4 % — ABNORMAL HIGH (ref 3.0–12.0)
Neutro Abs: 1.9 10*3/uL (ref 1.4–7.7)
Potassium: 4.1 meq/L (ref 3.5–5.1)
RBC: 4.09 M/uL (ref 3.87–5.11)
RDW: 13.5 % (ref 11.5–14.6)
Sodium: 133 meq/L — ABNORMAL LOW (ref 135–145)
TSH: 1.02 microintl units/mL (ref 0.35–5.50)
Total CHOL/HDL Ratio: 2
Total Protein: 7.1 g/dL (ref 6.0–8.3)
Triglycerides: 36 mg/dL (ref 0.0–149.0)

## 2009-01-19 ENCOUNTER — Encounter (INDEPENDENT_AMBULATORY_CARE_PROVIDER_SITE_OTHER): Payer: Self-pay | Admitting: *Deleted

## 2009-03-17 ENCOUNTER — Ambulatory Visit: Payer: Self-pay | Admitting: Internal Medicine

## 2009-04-12 ENCOUNTER — Telehealth (INDEPENDENT_AMBULATORY_CARE_PROVIDER_SITE_OTHER): Payer: Self-pay | Admitting: *Deleted

## 2009-04-14 ENCOUNTER — Telehealth (INDEPENDENT_AMBULATORY_CARE_PROVIDER_SITE_OTHER): Payer: Self-pay | Admitting: *Deleted

## 2009-04-20 ENCOUNTER — Telehealth (INDEPENDENT_AMBULATORY_CARE_PROVIDER_SITE_OTHER): Payer: Self-pay | Admitting: *Deleted

## 2009-04-28 ENCOUNTER — Ambulatory Visit: Payer: Self-pay | Admitting: Internal Medicine

## 2009-05-11 ENCOUNTER — Telehealth: Payer: Self-pay | Admitting: Internal Medicine

## 2009-05-25 ENCOUNTER — Ambulatory Visit: Payer: Self-pay | Admitting: Internal Medicine

## 2009-06-21 ENCOUNTER — Telehealth (INDEPENDENT_AMBULATORY_CARE_PROVIDER_SITE_OTHER): Payer: Self-pay | Admitting: *Deleted

## 2009-06-22 ENCOUNTER — Ambulatory Visit: Payer: Self-pay | Admitting: Internal Medicine

## 2009-06-23 ENCOUNTER — Telehealth (INDEPENDENT_AMBULATORY_CARE_PROVIDER_SITE_OTHER): Payer: Self-pay | Admitting: *Deleted

## 2009-07-06 ENCOUNTER — Encounter: Payer: Self-pay | Admitting: Internal Medicine

## 2009-07-19 ENCOUNTER — Ambulatory Visit: Payer: Self-pay | Admitting: Internal Medicine

## 2009-07-20 ENCOUNTER — Encounter: Payer: Self-pay | Admitting: Internal Medicine

## 2009-07-22 ENCOUNTER — Telehealth (INDEPENDENT_AMBULATORY_CARE_PROVIDER_SITE_OTHER): Payer: Self-pay | Admitting: *Deleted

## 2009-07-25 ENCOUNTER — Telehealth: Payer: Self-pay | Admitting: Internal Medicine

## 2009-07-27 ENCOUNTER — Ambulatory Visit (HOSPITAL_COMMUNITY): Admission: RE | Admit: 2009-07-27 | Discharge: 2009-07-27 | Payer: Self-pay | Admitting: Obstetrics and Gynecology

## 2009-08-08 ENCOUNTER — Telehealth (INDEPENDENT_AMBULATORY_CARE_PROVIDER_SITE_OTHER): Payer: Self-pay | Admitting: *Deleted

## 2009-08-09 ENCOUNTER — Ambulatory Visit: Payer: Self-pay | Admitting: Internal Medicine

## 2009-08-23 ENCOUNTER — Ambulatory Visit: Payer: Self-pay | Admitting: Internal Medicine

## 2009-08-26 ENCOUNTER — Telehealth (INDEPENDENT_AMBULATORY_CARE_PROVIDER_SITE_OTHER): Payer: Self-pay | Admitting: *Deleted

## 2009-08-26 ENCOUNTER — Telehealth: Payer: Self-pay | Admitting: Internal Medicine

## 2009-09-02 ENCOUNTER — Telehealth (INDEPENDENT_AMBULATORY_CARE_PROVIDER_SITE_OTHER): Payer: Self-pay | Admitting: *Deleted

## 2009-09-29 ENCOUNTER — Encounter: Payer: Self-pay | Admitting: Internal Medicine

## 2009-12-05 ENCOUNTER — Telehealth (INDEPENDENT_AMBULATORY_CARE_PROVIDER_SITE_OTHER): Payer: Self-pay | Admitting: *Deleted

## 2009-12-12 ENCOUNTER — Telehealth (INDEPENDENT_AMBULATORY_CARE_PROVIDER_SITE_OTHER): Payer: Self-pay | Admitting: *Deleted

## 2009-12-25 ENCOUNTER — Inpatient Hospital Stay (HOSPITAL_COMMUNITY): Admission: EM | Admit: 2009-12-25 | Discharge: 2009-12-29 | Payer: Self-pay | Admitting: Occupational Therapy

## 2009-12-27 ENCOUNTER — Encounter (INDEPENDENT_AMBULATORY_CARE_PROVIDER_SITE_OTHER): Payer: Self-pay | Admitting: Internal Medicine

## 2009-12-29 ENCOUNTER — Encounter: Payer: Self-pay | Admitting: Internal Medicine

## 2010-01-20 ENCOUNTER — Telehealth: Payer: Self-pay | Admitting: Internal Medicine

## 2010-01-24 ENCOUNTER — Telehealth (INDEPENDENT_AMBULATORY_CARE_PROVIDER_SITE_OTHER): Payer: Self-pay | Admitting: *Deleted

## 2010-02-01 ENCOUNTER — Ambulatory Visit: Payer: Self-pay | Admitting: Internal Medicine

## 2010-02-07 ENCOUNTER — Ambulatory Visit: Payer: Self-pay | Admitting: Internal Medicine

## 2010-02-12 HISTORY — PX: TOTAL HIP ARTHROPLASTY: SHX124

## 2010-03-02 ENCOUNTER — Encounter: Payer: Self-pay | Admitting: Internal Medicine

## 2010-03-06 ENCOUNTER — Encounter: Payer: Self-pay | Admitting: Internal Medicine

## 2010-03-06 ENCOUNTER — Telehealth (INDEPENDENT_AMBULATORY_CARE_PROVIDER_SITE_OTHER): Payer: Self-pay | Admitting: *Deleted

## 2010-03-09 ENCOUNTER — Encounter: Payer: Self-pay | Admitting: Internal Medicine

## 2010-03-12 LAB — CONVERTED CEMR LAB
HDL goal, serum: 40 mg/dL
Hgb A1c MFr Bld: 5.8 % (ref 4.6–6.0)
LDL Goal: 130 mg/dL

## 2010-03-13 ENCOUNTER — Encounter: Payer: Self-pay | Admitting: Internal Medicine

## 2010-03-16 NOTE — Progress Notes (Signed)
Summary: rx for Ambien-   Phone Note Call from Patient Call back at Work Phone [phone]   Call For: young Reason for Call: Talk to Nurse Summary of Call: Lunesta not working for pt, would like to know if you would call in Ambien again.  This worked for her. [facility] Initial call taken by: Eugene Gavia,  August 26, 2009 10:52 AM  Follow-up for Phone Call        [facility].  When pt called on 07-25-2009 she complained that Ambien was causing sleep walking and that she was having difficulty falling and staying asleep and therefore was switched to [place].  So now she wants to go back on Ambien?  Aundra Millet Reynolds LPN  August 26, 2009 10:57 AM    pt returned our call.  pt states she tried the Lunesta samples but states she stayed awake throughout the night.  Pt requests to go back to Ambien.  Please advise.  Aundra Millet Reynolds LPN  August 26, 2009 12:39 PM   Additional Follow-up for Phone Call Additional follow up Details #1::        OK to switch back to Ambien- I have put it on med list. Please send. Additional Follow-up by: Waymon Budge MD,  August 26, 2009 12:50 PM    Additional Follow-up for Phone Call Additional follow up Details #2::    Spoke with pt and advised that rx for zolpidem has been called to [facility].  I asked the pharmacist to deliver this to pt per her request. Follow-up by: Vernie Murders,  August 26, 2009 1:11 PM  New/Updated Medications: ZOLPIDEM TARTRATE 10 MG TABS (ZOLPIDEM TARTRATE) 1 for sleep if needed Prescriptions: ZOLPIDEM TARTRATE 10 MG TABS (ZOLPIDEM TARTRATE) 1 for sleep if needed  #30 x 5   Entered by:   Vernie Murders   Authorized by:   Waymon Budge MD   Signed by:   Vernie Murders on 08/26/2009   Method used:   Telephoned to ...       [facility]* (retail)       [address]       [place], [place]  [id number]       Ph: [phone]       Fax: [phone]   RxID:   [id number] ZOLPIDEM TARTRATE 10 MG TABS (ZOLPIDEM  TARTRATE) 1 for sleep if needed  #30 x prn   Entered by:   Waymon Budge MD   Authorized by:   Pulmonary Triage   Signed by:   Waymon Budge MD on 08/26/2009   Method used:   Historical   RxID:   [id number]

## 2010-03-16 NOTE — Progress Notes (Signed)
Summary: ALLERGY  Phone Note Other Incoming   Caller: Pt. sent in order form:"Dr.Young prscribed new serum." Details for Reason: Need Rx Summary of Call: Dr.Young,you retested Mrs.Janice 05-25-09. In your notes you said "We are going to restart & Rebuild w/ new vac.mix based on today's testing." Please write [place] a rx so we can send it to Mrs.Scalise. You also said in your notes for her to cont. giving her own shots for now. Initial call taken by: Dimas Millin,  Jun 21, 2009 2:24 PM  Follow-up for Phone Call        We are restarting allergy vaccine based on latest skin testing. Follow-up by: Waymon Budge MD,  Jun 22, 2009 12:41 PM

## 2010-03-16 NOTE — Letter (Signed)
Summary: Encounter Notice/[facility]  Encounter Notice/[place]   Imported By: Lanelle Bal 01/06/2010 14:51:58  _____________________________________________________________________  External Attachment:    Type:   Image     Comment:   External Document

## 2010-03-16 NOTE — Progress Notes (Signed)
Summary: REFILL REQUEST  Phone Note Refill Request Call back at [phone] Message from:  Pharmacy on August 08, 2009 12:46 PM  Refills Requested: Medication #1:  VERAPAMIL HCL CR 180 MG TBCR 1 by mouth qd   Dosage confirmed as above?Dosage Confirmed   Supply Requested: 1 month   Last Refilled: 07/04/2009 [facility]  Next Appointment Scheduled: NONE Initial call taken by: Lavell Islam,  August 08, 2009 12:47 PM    Prescriptions: VERAPAMIL HCL CR 180 MG TBCR (VERAPAMIL HCL) 1 by mouth qd  #30 Each x 4   Entered by:   Shonna Chock   Authorized by:   Marga Melnick MD   Signed by:   Shonna Chock on 08/08/2009   Method used:   Electronically to        [facility]* (retail)       [address]       [place], [place]  [address]       Ph: [phone]       Fax: [phone]   RxID:   [id number]

## 2010-03-16 NOTE — Progress Notes (Signed)
Summary: ALT sched for 05/25/09-lmr  Phone Note Call from Patient Call back at [phone]   Caller: Patient Call For: young Summary of Call: returning phone call Initial call taken by: Darletta Moll,  April 20, 2009 1:14 PM  Follow-up for Phone Call        Spoke with pt and sched her for ALT for 05/25/09 at 3:30 pm.  Pt instructed to not take any OTC cold meds or antihistamines at least 3 days prior to this date.  Pt verbalized understanding. Follow-up by: Vernie Murders,  April 20, 2009 1:41 PM

## 2010-03-16 NOTE — Progress Notes (Signed)
Summary: refill  Phone Note Refill Request Message from:  Fax from Pharmacy on December 12, 2009 8:49 AM  Refills Requested: Medication #1:  LEXAPRO 20 MG  TABS take one tablet once daily [facility] - fax [phone]  Initial call taken by: Okey Regal Spring,  December 12, 2009 8:50 AM    Prescriptions: LEXAPRO 20 MG  TABS (ESCITALOPRAM OXALATE) take one tablet once daily  #30 Each x 0   Entered by:   Shonna Chock CMA   Authorized by:   Marga Melnick MD   Signed by:   Shonna Chock CMA on 12/12/2009   Method used:   Electronically to        [facility]* (retail)       [address]       [place], [place]  [address]       Ph: [phone]       Fax: [phone]   RxID:   [id number]

## 2010-03-16 NOTE — Progress Notes (Signed)
Summary: Alfonso Patten-  Phone Note Call from Patient   Caller: Patient Call For: YOUNG Summary of Call: PT REQUESTING MEDICATION TO HELP HER SLEEP [facility] Initial call taken by: Rickard Patience,  July 25, 2009 11:29 AM  Follow-up for Phone Call        pt used ambien 10mg  in the past which caused sleep walking.  states she is using the lorazepam but it does not help.  having difficulty falling asleep and staying asleep.  please advise, thanks! Boone Master CNA/MA  July 25, 2009 11:37 AM     Additional Follow-up for Phone Call Additional follow up Details #1::        Per CDY-pick up samples of Lunesta 2mg  #7 take 1 by mouth at bedtime as needed; call for RX if this is helpful.Reynaldo Minium CMA  July 25, 2009 2:22 PM   pt states she has no way of getting to the office to pick up samples so she requests an rx go ahead and be sent to [facility] and ask pharmacy to deliver. Pelase advise if ok to send rx. Thanks.Carron Curie CMA  July 25, 2009 2:30 PM     Additional Follow-up for Phone Call Additional follow up Details #2::    Per CDY-give RX for Lunest 2mg  #30 take 1 by mouth at bedtime as needed sleep with 5 refills.Reynaldo Minium CMA  July 25, 2009 5:20 PM   ATC pt x 2 and line was busy, [facility] on 07/26/09 Vernie Murders  July 25, 2009 5:23 PM  rx sent. pt aware. Carron Curie CMA  July 26, 2009 8:51 AM   New/Updated Medications: LUNESTA 2 MG TABS (ESZOPICLONE) Take 1 tab by mouth at bedtime as needed sleep Prescriptions: LUNESTA 2 MG TABS (ESZOPICLONE) Take 1 tab by mouth at bedtime as needed sleep  #30 x 5   Entered by:   Carron Curie CMA   Authorized by:   Waymon Budge MD   Signed by:   Carron Curie CMA on 07/26/2009   Method used:   Telephoned to ...       [facility]* (retail)       [address]       [place], [place]  [address]       Ph: [phone]       Fax: [phone]   RxID:   [id number]

## 2010-03-16 NOTE — Letter (Signed)
Summary: [facility]  [facility]   Imported By: Lanelle Bal 08/10/2009 09:43:55  _____________________________________________________________________  External Attachment:    Type:   Image     Comment:   External Document

## 2010-03-16 NOTE — Progress Notes (Signed)
Summary: Refill Request  Phone Note Refill Request Call back at [phone] Message from:  Pharmacy on January 20, 2010 2:22 PM  Refills Requested: Medication #1:  VERAPAMIL HCL CR 180 MG TBCR 1 by mouth qd   Dosage confirmed as above?Dosage Confirmed   Supply Requested: 1 month   Last Refilled: 12/19/2009  Medication #2:  LEXAPRO 20 MG  TABS take one tablet once daily   Dosage confirmed as above?Dosage Confirmed   Supply Requested: 1 month   Last Refilled: 12/12/2009 [facility]  Next Appointment Scheduled: 12.21.11 Initial call taken by: Harold Barban,  January 20, 2010 2:22 PM    Prescriptions: LEXAPRO 20 MG  TABS (ESCITALOPRAM OXALATE) take one tablet once daily  #30 Each x 1   Entered by:   Lucious Groves CMA   Authorized by:   Marga Melnick MD   Signed by:   Lucious Groves CMA on 01/20/2010   Method used:   Faxed to ...       [facility]* (retail)       [address]       [place], [place]  [address]       Ph: [phone]       Fax: [phone]   RxID:   [id number] VERAPAMIL HCL CR 180 MG TBCR (VERAPAMIL HCL) 1 by mouth qd  #30 Each x 1   Entered by:   Lucious Groves CMA   Authorized by:   Marga Melnick MD   Signed by:   Lucious Groves CMA on 01/20/2010   Method used:   Faxed to ...       [facility]* (retail)       [address]       [place], [place]  [address]       Ph: [phone]       Fax: [phone]   RxID:   [id number]

## 2010-03-16 NOTE — Progress Notes (Signed)
Summary: Alendronate sodium refill  Phone Note Refill Request Message from:  Fax from Pharmacy on January 24, 2010 4:12 PM  Refills Requested: Medication #1:  FOSAMAX 70 MG TABS 1 tab q week   Last Refilled: 12/19/2009 [facility], [facility], [place], [place]  [phone], fax - [phone]   qty - 4  Next Appointment Scheduled: Mon 03/20/2010  [facility] Initial call taken by: Jerolyn Shin,  January 24, 2010 4:14 PM    Prescriptions: FOSAMAX 70 MG TABS (ALENDRONATE SODIUM) 1 tab q week  #4 Each x 2   Entered by:   Shonna Chock CMA   Authorized by:   Marga Melnick MD   Signed by:   Shonna Chock CMA on 01/24/2010   Method used:   Electronically to        [facility]* (retail)       [address]       [place], [place]  [address]       Ph: [phone]       Fax: [phone]   RxID:   [id number]

## 2010-03-16 NOTE — Progress Notes (Signed)
Summary: Labs due per MD  Phone Note Outgoing Call Call back at [facility] Phone [phone]   Call placed by: Shonna Chock CMA,  March 06, 2010 1:36 PM Call placed to: Patient Summary of Call: Spoke with patient reguarding information below:  Last seen 04/28/2009; polypharmacy present with multiple potential drug:drug interactions. Fasting labs ( can be done here or @ [facility]) & F/U appt 5-7 days later  needed before refills. Labs/ Codes: Lipids, BMET, TSH , CBC& dif, CK, iron panel, B12, foltae, hepatic panel/ 272.4, 995.20, 401.9. Actual pill bottles need to be brought to appt.  Patient states she has a CPX appointment on 03/20/2010 and will be fasting and any labs can be draw at that time, patient states she is in a wheelchair and its hard for her to get around right now and she prefers to wait til CPX appointment./Chrae [facility] CMA  March 06, 2010 1:37 PM

## 2010-03-16 NOTE — Progress Notes (Signed)
Summary: wants to sch allergy test- LMTCB x 2  Phone Note Call from Patient Call back at Home Phone [phone]   Caller: Patient Call For: young Summary of Call: patient sates when she was last seen, dr young told her that if she had anymore problems that she needed to come in for an allergy test. just wanted to verify if it was ok to schedule this.  Initial call taken by: Valinda Hoar,  April 14, 2009 1:03 PM  Follow-up for Phone Call        dr young this pt was last seen in july do you wat [place] to schedule a skin test for her or do you want regular ov 1st --pls advise  Philipp Deputy [facility]  April 14, 2009 2:48 PM   Additional Follow-up for Phone Call Additional follow up Details #1::        Per CDY-ok to schedule for allergy test next opening for April 1st Wed of the month afternoon time.Reynaldo Minium CMA  April 14, 2009 5:14 PM   LMTCB to scheduel pt.Carron Curie CMA  April 14, 2009 5:24 PM     Additional Follow-up for Phone Call Additional follow up Details #2::    LMOMTCBx2 Vernie Murders  April 18, 2009 4:47 PM   [facility].  This is our 3rd attempt to contact pt.  per protocol, will sign off on this message and wait for pt to return our calls.  Aundra Millet Reynolds LPN  April 20, 979 9:36 AM

## 2010-03-16 NOTE — Progress Notes (Signed)
Summary: Refill Request  Phone Note Refill Request Call back at [phone] Message from:  Pharmacy on July 22, 2009 12:59 PM  Refills Requested: Medication #1:  HYOSCYAMINE ER 0.375 MG T  Take 1 or 2 tablets every 12 hours   Dosage confirmed as above?Dosage Confirmed   Supply Requested: 3 months   Last Refilled: 10/07/2008 [facility]   Next Appointment Scheduled: June 13th Initial call taken by: Harold Barban,  July 22, 2009 1:06 PM    New/Updated Medications: HYOSCYAMINE SULFATE CR 0.375 MG XR12H-TAB (HYOSCYAMINE SULFATE) 1 or 2 tablets every 12 hours Prescriptions: HYOSCYAMINE SULFATE CR 0.375 MG XR12H-TAB (HYOSCYAMINE SULFATE) 1 or 2 tablets every 12 hours  #90 x 0   Entered by:   Shonna Chock   Authorized by:   Marga Melnick MD   Signed by:   Shonna Chock on 07/22/2009   Method used:   Electronically to        [facility]* (retail)       [address]       [place], [place]  [address]       Ph: [phone]       Fax: [phone]   RxID:   [id number]

## 2010-03-16 NOTE — Progress Notes (Signed)
Summary: allergy vaccine question  Phone Note Call from Patient Call back at Home Phone [phone] Call back at [phone]   Caller: Patient Call For: Allergy Lab Summary of Call: pt called and would like to discuss her allergy vaccines she got in the mail recently.  Will forward message to allergy lab to address.  Arman Filter LPN  August 26, 2009 12:41 PM  Initial call taken by: Arman Filter LPN,  August 26, 2009 12:41 PM  Follow-up for Phone Call        called pt. & lmomtcb at [phone] .I called her home and the line was busy. (08-26-09)  Called pt.this morning somehow she said she received her 1:50  before her 1:500. I think she's confused. This is next to impossilbe because Susanne sent 1:500 08-09-09&I sent 1:50 08-23-09. She currently taking 1:50;she took her 2nd dose today 0.2 she did fine both times.   Follow-up by: Dimas Millin,  August 29, 2009 2:27 PM  Additional Follow-up for Phone Call Additional follow up Details #1::        Noted Additional Follow-up by: Waymon Budge MD,  August 29, 2009 5:07 PM

## 2010-03-16 NOTE — Miscellaneous (Signed)
Summary: Skin test/[facility]  Skin test/[facility]   Imported By: Sherian Rein 06/16/2009 13:02:22  _____________________________________________________________________  External Attachment:    Type:   Image     Comment:   External Document

## 2010-03-16 NOTE — Progress Notes (Signed)
Summary: Allergy vaccine restart/ rebuild  Phone Note Outgoing Call   Call placed by: Clarise Cruz [facility]),  Jun 23, 2009 2:54 PM Call placed to: Patient Summary of Call: Hello Dr. Maple Hudson, We received your new RX for Wyoma Genson new allergy vaccine. Your RX state for her to "start here andAlso may teach."  I called Mrs. Planck to advise her that we had her vaccine ready for her to come in for her 1st shot.....she stated," please amil it."  advised her that Dr. Maple Hudson usually request the patient to come in for the first shot and then we can teach you to give your own shots. Mrs. Rabadan replied that she had given her own shots 5 or 6 times before with new vaccines and that she had arthritis and it was hard for her to get out and to just mail it. I advised her I would check with Dr. Maple Hudson and see what he wanted to do. Please advise.  Also if I mail her vaccine out do I need to include an RX for needles and an epi-pen? Please advise. Thanks Initial call taken by: Clarise Cruz [facility]),  Jun 23, 2009 3:10 PM  Follow-up for Phone Call        OK to send her new vaccine with schedule for buildup. She has done this before. We just need to be able to track her through the buildup cycles, making sure she gets the correct vials and instructions at each step. I t is probably time to update her epipen script. Follow-up by: Waymon Budge MD,  Jun 23, 2009 3:26 PM  Additional Follow-up for Phone Call Additional follow up Details #1::        Ok. I will mail her vaccine to her and periodically moving her up with new sheets and vaccines. I called and asked her about an Epi-Pen and needles and she said she had both and didn't need them. Additional Follow-up by: Clarise Cruz [facility]),  Jun 27, 2009 9:55 AM

## 2010-03-16 NOTE — Progress Notes (Signed)
Summary: allegra refill- pharm calling  Phone Note From Pharmacy   Caller: [facility]* Call For: young  Summary of Call: FYI: there is an unsigned rx refill request in katie's box for [facility]. [facility] called and is re-faxing now.  Initial call taken by: Tivis Ringer, CNA,  May 11, 2009 12:11 PM  Follow-up for Phone Call        rx sent. Carron Curie CMA  May 11, 2009 12:43 PM     Prescriptions: ALLEGRA 60 MG  TABS (FEXOFENADINE HCL) take 1 tablet by mouth two times a day  #60 Each x 5   Entered by:   Carron Curie CMA   Authorized by:   Waymon Budge MD   Signed by:   Carron Curie CMA on 05/11/2009   Method used:   Electronically to        [facility]* (retail)       [address]       [place], [place]  [address]       Ph: [phone]       Fax: [phone]   RxID:   [id number]

## 2010-03-16 NOTE — Miscellaneous (Signed)
Summary: Injection Orders / [facility]    Injection Orders / [facility]    Imported By: Lennie Odor 07/22/2009 13:57:16  _____________________________________________________________________  External Attachment:    Type:   Image     Comment:   External Document

## 2010-03-16 NOTE — Progress Notes (Signed)
Summary: Med Concerns  Phone Note Outgoing Call   Call placed by: Shonna Chock CMA,  September 02, 2009 11:23 AM Details for Reason: Vicodin Refill request Summary of Call: Per Dr.Hopper: please ask Pharmacist to verify prescribing MD & frequency of administrationof this narcotic. I reviewed EMR & do not see that I have Rxed this.It may be a Rx from her [facility] MD.    I spoke with the pharmacy:  Last filled on 08/12/09 #60 by Dr.Aluiso  I spoke with patient and supply given on 08/12/09 is all gone. Patient called Dr.Aluiso's office and they told her to contact the surgeon(Dr.Jinnah) that will do her hip surgery, patient decided to have pharmacy send to her primary (Dr.Hopper instead). Patient said she can barley walk around in her house to find that number to Dr.Jinnah, I informed patient I will get the number for her and cal her back.    Follow-up for Phone Call        I called patient back and gave her the number [phone], patient ok'd and said she will call them now Follow-up by: Shonna Chock CMA,  September 02, 2009 12:04 PM

## 2010-03-16 NOTE — Progress Notes (Signed)
Summary: Refill Request  Phone Note Refill Request Message from:  Pharmacy on [facility] Fax #: [phone]  Refills Requested: Medication #1:  LEXAPRO 20 MG  TABS take one tablet once daily   Dosage confirmed as above?Dosage Confirmed   Supply Requested: 1 month   Last Refilled: 03/04/2009 Initial call taken by: Harold Barban,  April 12, 2009 11:35 AM    Prescriptions: LEXAPRO 20 MG  TABS (ESCITALOPRAM OXALATE) take one tablet once daily  #30 Each x 7   Entered by:   Shonna Chock   Authorized by:   Marga Melnick MD   Signed by:   Shonna Chock on 04/12/2009   Method used:   Electronically to        [facility]* (retail)       [address]       [place], [place]  [address]       Ph: [phone]       Fax: [phone]   RxID:   [id number]

## 2010-03-16 NOTE — Letter (Signed)
Summary: Appts Meds & Instructions/[facility]  Appts Meds & Instructions/[facility]   Imported By: Lanelle Bal 10/10/2009 14:06:35  _____________________________________________________________________  External Attachment:    Type:   Image     Comment:   External Document

## 2010-03-16 NOTE — Assessment & Plan Note (Signed)
Summary: allergy skin testing//lmr   Vital Signs:  Patient profile:   70 year old female Height:      68.25 inches Weight:      157.25 pounds BMI:     23.82 O2 Sat:      96 % on Room air Pulse rate:   83 / minute BP sitting:   108 / 60  (left arm) Cuff size:   regular  Vitals Entered By: Reynaldo Minium CMA (May 25, 2009 3:38 PM)  O2 Flow:  Room air  Primary Provider/Referring Provider:  Alwyn Ren   History of Present Illness:  08/26/08- Allergic rhinitis, insomnia Says she is sleeping well with current med. Sleep hygiene good. Without med won't sleep. uses both ambien and lorazepam- well tolerated. Long talk about sedatives, sleep hygiene. Allergy vaccine does very well. Asks epipen refill. No concerns or reactions.  May 25, 2009- Allergic rhinitis, chronic insomnia Reports sleep walking on Ambien recently. Found herself on floor, also ate a sandwich. Then made phone calls. Had done some crawling and had urninated on herself. Family came to help. She had been taking both ambien and lorazepam together for years without problem. For the last 5 nights she has used lorazepam alone. For 2 nights she didn't feel she slept. In the last 3 nights she has slept better. Otherwise stable. Now scheduled for allergy re-test after rhinitis flare early February. Mostly nose with rhinorhea. Not cough or wheeze much. Skin test: positive grass, weed, tree, mold     Current Medications (verified): 1)  Cozaar 100 Mg Tabs (Losartan Potassium) .... Take 1 Tablet By Mouth Once A Day 2)  Aciphex 20 Mg Tbec (Rabeprazole Sodium) .... Take One Tablet in The Morning 3)  Fosamax 70 Mg Tabs (Alendronate Sodium) .Marland Kitchen.. 1 Tab Q Week 4)  Lipitor 40 Mg Tabs (Atorvastatin Calcium) .... Take 1 Tablet By Mouth Once A Day 5)  Spironolactone 25 Mg Tabs (Spironolactone) .... Take One Tablet Daily 6)  Celebrex 200 Mg Caps (Celecoxib) .... Take One Capsule Once Daily As Needed,avoid Daily Use Due To Gi & Cardiac  Potential Adverse Effects 7)  Verapamil Hcl Cr 180 Mg Tbcr (Verapamil Hcl) .Marland Kitchen.. 1 By Mouth Qd 8)  Lexapro 20 Mg  Tabs (Escitalopram Oxalate) .... Take One Tablet Once Daily 9)  Allegra 60 Mg  Tabs (Fexofenadine Hcl) .... Take 1 Tablet By Mouth Two Times A Day 10)  Multivitamin W/450mg  Calcium & 200 Iu Vitamin D .... Take 1 Tablet By Mouth Two Times A Day As Directed 11)  Vitamin C 600 Mg .... Take 1 Tablet By Mouth Daily 12)  Calcium Supplement 500mg  W/200 Iu Vitamin D .... Take By Mouth Two Times A Day 13)  Iron Supplement 27mg  .... Take Daily By Mouth 14)  Glucosamine-Chondroitin 1500-1200 Mg/27ml  Liqd (Glucosamine-Chondroitin) .... Daily As Directed 15)  Aspirin 81 Mg  Tabs (Aspirin) .... Take 1 Tablet By Mouth As Directed 16)  Flax Seed Oil 1000 Mg  Caps (Flaxseed (Linseed)) .... Take 1 Capsule By Mouth Two Times A Day 17)  Potassium Gluconate 500mg  .... Take 1 Tablet By Mouth Daily 18)  Lorazepam 1 Mg  Tabs (Lorazepam) .... Take 1 Tablet By Mouth Daily As Directed 19)  Allergy Vaccine Go (W-E) .... Weekly 20)  Cyclobenzaprine Hcl 10 Mg  Tabs (Cyclobenzaprine Hcl) .... As Needed 21)  Promethazine Hcl 25 Mg  Tabs (Promethazine Hcl) .... As Needed 22)  Hydrocodone-Acetaminophen 5-500 Mg  Tabs (Hydrocodone-Acetaminophen) .... As Needed 23)  Coenzyme 24)  Epipen 0.3 Mg/0.29ml (1:1000) Devi (Epinephrine Hcl (Anaphylaxis)) .... For Severe Allergic Reaction 25)  Doxycycline Hyclate 100 Mg Tabs (Doxycycline Hyclate) .... 2 Tabs Today Then One Daily 26)  Aleve 220 Mg Caps (Naproxen Sodium) .... Take 1 Tab Once Daily As Needed 27)  Gabapentin 600 Mg Tabs (Gabapentin) .... Take 1 Tab At Bedtime 28)  Ciprofloxacin Hcl 500 Mg Tabs (Ciprofloxacin Hcl) .Marland Kitchen.. 1 Two Times A Day 29)  Gabapentin 600 Mg Tabs (Gabapentin) .Marland Kitchen.. 1 At Bedtime  As Needed  Allergies (verified): 1)  ! * Resorinol (Found in Acne & Dandruff Products) 2)  ! * Hctz  Past History:  Past Surgical History: Last updated:  01/11/2009 BACK SURGERY-L4-5 FUSION(06/2000); HERNIA REPAIR CYSTOSCOPY (12/2002) C4-7 fusion (08/2003) Lumbar fusion L2-3 in 06/2008; Ankle fusion post fracture 2008; G 2 P 2; Umbilical hernia surgery Appendectomy Tonsillectomy  Family History: Last updated: 01/11/2009 Father:MI @ 51, HTN Mother: Dementia Paternal uncles:  CVA  Maternal aunt x 1:  Breast CA Maternal aunts x 3-4:  Alzheimer's M Cousins x 2 :  Breast CA  Social History: Last updated: 01/11/2009 lives alone, has two children.  She is widowed.   Patient states former smoker. She quit  1970 and prior to that she smoked for 10 yrs at 1-3ppd. 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 Alcohol use-yes: socially Regular exercise-no  Risk Factors: Exercise: no (01/11/2009)  Risk Factors: Smoking Status: quit (07/15/2007)  Past Medical History: Sleep Apnea , Dr Maple Hudson (no CPAP) CLOSED FRACTURE OF RIB, UNSPECIFIED (ICD-807.00) INJURY UNSPEC  PELVIC GIRDLE&LOWER LIMB (ICD-956.9) GERD (ICD-530.81) OSTEOARTHRITIS (ICD-715.90) INTERSTITIAL CYSTITIS (ICD-595.1) COLONOSCOPY, HX OF (ICD-V12.79) SYNCOPE, HX OF (ICD-V12.49) TONSILLECTOMY AND ADENOIDECTOMY, HX OF (ICD-V45.79) PELVIC FRACTURE (ICD-808.8) X 2 OSTEOPENIA (ICD-733.90) HYPERTENSION (ICD-401.9) HYPERLIPIDEMIA (ICD-272.4) ALLERGIC RHINITIS (ICD-477.9)- update skin test 05/25/09 Chronic insomnia  Review of Systems      See HPI  The patient denies anorexia, fever, weight loss, weight gain, vision loss, decreased hearing, hoarseness, chest pain, syncope, dyspnea on exertion, peripheral edema, prolonged cough, headaches, hemoptysis, and severe indigestion/heartburn.         Denies headache, confusion, syncope, numbness or weakness  Physical Exam  Additional Exam:  General: A/Ox3; pleasant and cooperative, NAD, wdwn alert and comfortable appearing SKIN: no rash, lesions NODES: no lymphadenopathy HEENT: [place]/AT, EOM- WNL, Conjuctivae- clear, PERRLA, TM-WNL, Nose-  clear watery mucus/ rubbing nose, Throat- clear and wnl,  Mallampati  II NECK: Supple w/ fair ROM, JVD- none, normal carotid impulses w/o bruits Thyroid-  CHEST: Clear to P&A HEART: RRR, no m/g/r heard ABDOMEN: Soft and nl;  ZOX:WRUE, nl pulses, no edema  NEURO: Grossly intact to observation      Impression & Recommendations:  Problem # 1:  ALLERGIC RHINITIS (ICD-477.9)  Seasonal rhintis with worsening symptoms over last year. As discussed at last visit, we are retesting. She is going to restart and rebuild with new vaccine mix based on today's testing. We discussed risk, Epipen, policy. She will continue for now giving her own vaccine.  Her updated medication list for this problem includes:    Allegra 60 Mg Tabs (Fexofenadine hcl) .Marland Kitchen... Take 1 tablet by mouth two times a day    Promethazine Hcl 25 Mg Tabs (Promethazine hcl) .Marland Kitchen... As needed  Problem # 2:  INSOMNIA (ICD-780.52)  Chronic insomnia. Parasomnia/ sleep walking with combination of ambien and lorazepam at her usual doses. This may be the familiar reported ambien issue. She seems to be settling in to use lorazepam alone. I have suggested  limiting lorazepam to 1-1.5 mg/ night. I don't get hx indicating a neurologic change, but she admits increased stress lately, which may have precipitated her problems.  The following medications were removed from the medication list:    Ambien 10 Mg Tabs (Zolpidem tartrate) .Marland Kitchen... 1 by mouth at bedtime as needed for sleep  Medications Added to Medication List This Visit: 1)  Lorazepam 1 Mg Tabs (Lorazepam) .... Take 1  to 1.5 tablet by mouth daily if needed for sleep 2)  Allergy Vaccine Go (w-e)  .... Restart new mix  Other Orders: Est. Patient Level II (91478) Allergy Puncture Test (29562) Allergy I.D Test (13086)  Patient Instructions: 1)  Please schedule a follow-up appointment in 2 months. 2)  We will remix and restart your allergy vaccine. The allergy laboratory will call you when  it is ready. 3)  Be sure to call the allergy lab if you have any problems or questions with the vaccine build-up process 4)  Lorazepam script is re-written. If 1 mg is enough, try to stay with that.. Prescriptions: LORAZEPAM 1 MG  TABS (LORAZEPAM) take 1  to 1.5 tablet by mouth daily if needed for sleep  #50 x 5   Entered and Authorized by:   Waymon Budge MD   Signed by:   Waymon Budge MD on 05/25/2009   Method used:   Print then Give to Patient   RxID:   [id number]

## 2010-03-16 NOTE — Progress Notes (Signed)
Summary: Refill Request  Phone Note Refill Request Message from:  Fax from Pharmacy on December 05, 2009 8:30 AM  Refills Requested: Medication #1:  ACIPHEX 20 MG TBEC TAKE ONE TABLET IN THE MORNING [place] - fax [phone]  Initial call taken by: Okey Regal [place],  December 05, 2009 8:35 AM    New/Updated Medications: ACIPHEX 20 MG TBEC (RABEPRAZOLE SODIUM) 1 by mouth once daily **APPOINTMENT DUE** Prescriptions: ACIPHEX 20 MG TBEC (RABEPRAZOLE SODIUM) 1 by mouth once daily **APPOINTMENT DUE**  #30 x 1   Entered by:   Shonna Chock CMA   Authorized by:   Marga Melnick MD   Signed by:   Shonna Chock CMA on 12/05/2009   Method used:   Electronically to        [facility] (retail)       [address]       [place], [place]  [id number]       Ph: [phone]       Fax: [phone]   RxID:   [id number]

## 2010-03-16 NOTE — Letter (Signed)
Summary: [facility]  [facility]   Imported By: Lanelle Bal 08/04/2009 12:23:25  _____________________________________________________________________  External Attachment:    Type:   Image     Comment:   External Document

## 2010-03-17 DIAGNOSIS — IMO0001 Reserved for inherently not codable concepts without codable children: Secondary | ICD-10-CM

## 2010-03-17 DIAGNOSIS — R935 Abnormal findings on diagnostic imaging of other abdominal regions, including retroperitoneum: Secondary | ICD-10-CM

## 2010-03-17 DIAGNOSIS — M6281 Muscle weakness (generalized): Secondary | ICD-10-CM

## 2010-03-17 DIAGNOSIS — G8918 Other acute postprocedural pain: Secondary | ICD-10-CM

## 2010-03-20 ENCOUNTER — Other Ambulatory Visit: Payer: Self-pay | Admitting: Internal Medicine

## 2010-03-20 ENCOUNTER — Encounter: Payer: Self-pay | Admitting: Internal Medicine

## 2010-03-20 ENCOUNTER — Encounter (INDEPENDENT_AMBULATORY_CARE_PROVIDER_SITE_OTHER): Payer: Medicare Other | Admitting: Internal Medicine

## 2010-03-20 DIAGNOSIS — M899 Disorder of bone, unspecified: Secondary | ICD-10-CM

## 2010-03-20 DIAGNOSIS — E785 Hyperlipidemia, unspecified: Secondary | ICD-10-CM

## 2010-03-20 DIAGNOSIS — M949 Disorder of cartilage, unspecified: Secondary | ICD-10-CM

## 2010-03-20 DIAGNOSIS — K589 Irritable bowel syndrome without diarrhea: Secondary | ICD-10-CM

## 2010-03-20 DIAGNOSIS — Z0000 Encounter for general adult medical examination without abnormal findings: Secondary | ICD-10-CM

## 2010-03-20 DIAGNOSIS — I10 Essential (primary) hypertension: Secondary | ICD-10-CM

## 2010-03-20 DIAGNOSIS — R7309 Other abnormal glucose: Secondary | ICD-10-CM

## 2010-03-20 LAB — LIPID PANEL
HDL: 85.8 mg/dL (ref >39.00)
Total CHOL/HDL Ratio: 3
Triglycerides: 60 mg/dL (ref 0.0–149.0)

## 2010-03-20 LAB — HEPATIC FUNCTION PANEL
ALT: 22 U/L (ref 0–35)
AST: 30 U/L (ref 0–37)
Albumin: 4.7 g/dL (ref 3.5–5.2)
Alkaline Phosphatase: 50 U/L (ref 39–117)

## 2010-03-20 LAB — CBC WITH DIFFERENTIAL/PLATELET
Basophils Absolute: 0 10*3/uL (ref 0.0–0.1)
Basophils Relative: 0.4 % (ref 0.0–3.0)
Eosinophils Relative: 0.8 % (ref 0.0–5.0)
Hemoglobin: 13.2 g/dL (ref 12.0–15.0)
Lymphocytes Relative: 30.4 % (ref 12.0–46.0)
Monocytes Relative: 10.2 % (ref 3.0–12.0)
Neutro Abs: 3 10*3/uL (ref 1.4–7.7)
RBC: 4.22 Mil/uL (ref 3.87–5.11)
RDW: 15.6 % — ABNORMAL HIGH (ref 11.5–14.6)
WBC: 5.1 10*3/uL (ref 4.5–10.5)

## 2010-03-20 LAB — BASIC METABOLIC PANEL
Calcium: 9.6 mg/dL (ref 8.4–10.5)
GFR: 101.31 mL/min (ref >60.00)
Potassium: 4.4 mEq/L (ref 3.5–5.1)
Sodium: 134 mEq/L — ABNORMAL LOW (ref 135–145)

## 2010-03-21 LAB — CONVERTED CEMR LAB: Vit D, 25-Hydroxy: 71 ng/mL (ref 30–89)

## 2010-03-22 NOTE — Letter (Signed)
Summary: [facility]N for Wheelchair/[facility]  [facility] for Wheelchair/[facility]   Imported By: Lanelle Bal 03/13/2010 12:48:19  _____________________________________________________________________  External Attachment:    Type:   Image     Comment:   External Document

## 2010-03-22 NOTE — Medication Information (Signed)
Summary: Refills/[facility]   Imported By: Lester Monsey 03/16/2010 09:06:32  _____________________________________________________________________  External Attachment:    Type:   Image     Comment:   External Document

## 2010-03-30 NOTE — Assessment & Plan Note (Signed)
Summary: cpx/fasting/kn   Vital Signs:  Patient profile:   70 year old female Height:      68.5 inches Weight:      157.4 pounds BMI:     23.67 Temp:     97.8 degrees F oral Pulse rate:   60 / minute Resp:     14 per minute BP sitting:   126 / 88  (left arm) Cuff size:   regular  Vitals Entered By: Shonna Chock CMA (March 20, 2010 1:32 PM) CC: CPX with fasting labs , Lipid Management  Vision Screening:Left eye w/o correction: 20 / 40 Right Eye w/o correction: 20 / 50 Both eyes w/o correction:  20/ 25       Vision Comments: Patient states she wears reading glasses   Vision Entered By: Shonna Chock CMA (March 20, 2010 1:37 PM)   Primary Care Provider:  Alwyn Ren  CC:  CPX with fasting labs  and Lipid Management.  History of Present Illness: Here for Medicare AWV: 1.Risk factors based on Past M, S, F history: see Diagnoses; chart updated 2.Physical Activities: unable to exercise ; she has been using a walker X 1 week ; sedentary for most part since 08/2009 following rib fractures ; THR in 08/11; leg surgery for tib/fib fracture 11/11. Dr Victorino Dike wants to D/C Fosamax; she has been on it 25 years.  3.Depression/mood: issues denied 4.Hearing: whisper heard @ 6 ft 5.ADL's: CNA daily  6.Fall Risk: Physical Therapy  2X/ week . Falls due to " weak joints"     7.Home Safety : safety proofed "pretty much"                                                                                    8.Height, weight, &visual acuity:see VS 9.Counseling: POA & Living Will in place 10.Labs ordered based on risk factors: see Orders 11. Referral Coordination: specialty referrals in place 12. Care Plan: see Instructions  13. Cognitive Assessment:Oriented  X 3 ; memory & recall  excellent   ; "WORLD " spelled backwards ; mood & affect normal. Hyperlipidemia Follow-Up: She reports constipation and diarrhea (IBS PMH), but denies muscle aches, GI upset, abdominal pain, and fatigue.  Other symptoms  include dypsnea due to deconditioning.  The patient denies the following symptoms: chest pain/pressure, palpitations, and syncope.  Compliance with medications (by patient report) has been near 100%.  Dietary compliance has been excellent.  Adjunctive measures currently used by the patient include ASA, folic acid, fish oil supplements, and Co-Q 10.Marland Kitchen   Hypertension Follow-Up:   She  denies lightheadedness, urinary frequency, and headaches.  Compliance with medications (by patient report) has been near 100%.  Adjunctive measures currently used by the patient include salt restriction.  BP @ home 110/70.   Lipid Management History:      Positive NCEP/ATP III risk factors include female age 47 years old or older, family history for ischemic heart disease (males less than 71 years old), and hypertension.  Negative NCEP/ATP III risk factors include no history of early menopause without estrogen hormone replacement, non-diabetic, HDL cholesterol greater than 60, non-tobacco-user status, no ASHD (atherosclerotic heart disease), no prior stroke/TIA,  no peripheral vascular disease, and no history of aortic aneurysm.     Preventive Screening-Counseling & Management  Alcohol-Tobacco     Alcohol drinks/day: 2-3     Packs/Day: 1-3     Year Started: 1960     Year Quit: 1969  Caffeine-Diet-Exercise     Caffeine use/day: none     Diet Comments: no diet  Hep-HIV-STD-Contraception     Dental Visit-last 6 months no     Sun Exposure-Excessive: no  Safety-Violence-Falls     Seat Belt Use: yes     Smoke Detectors: yes      Blood Transfusions:  yes and after 2001.        Travel History:  [place] 2010.    Current Medications (verified): 1)  Cozaar 100 Mg Tabs (Losartan Potassium) .... Take 1 Tablet By Mouth Once A Day 2)  Aciphex 20 Mg Tbec (Rabeprazole Sodium) .Marland Kitchen.. 1 By Mouth Once Daily 3)  Fosamax 70 Mg Tabs (Alendronate Sodium) .Marland Kitchen.. 1 Tab Q Week 4)  Lipitor 40 Mg Tabs (Atorvastatin Calcium) .... Take  1 Tablet By Mouth Once A Day 5)  Spironolactone 25 Mg Tabs (Spironolactone) .... Take One Tablet Daily 6)  Celebrex 200 Mg Caps (Celecoxib) .... Take One Capsule Once Daily As Needed,avoid Daily Use Due To Gi & Cardiac Potential Adverse Effects 7)  Verapamil Hcl Cr 180 Mg Tbcr (Verapamil Hcl) .Marland Kitchen.. 1 By Mouth Qd 8)  Lexapro 20 Mg  Tabs (Escitalopram Oxalate) .... Take One Tablet Once Daily 9)  Allegra Allergy 180 Mg Tabs (Fexofenadine Hcl) .Marland Kitchen.. 1 By Mouth Once Daily 10)  Multivitamin W/450mg  Calcium & 200 Iu Vitamin D .... Take 1 Tablet By Mouth Two Times A Day As Directed 11)  Vitamin C 1000 Mg Tabs (Ascorbic Acid) .Marland Kitchen.. 1 By Mouth Once Daily 12)  Calcium Supplement 600mg  W/400 Iu Vitamin D .... Take By Mouth Two Times A Day 13)  Ferrous Sulfate 325 (65 Fe) Mg Tabs (Ferrous Sulfate) .Marland Kitchen.. 1 By Mouth Once Daily 14)  Osteo Bi-Flex Adv Triple St  Tabs (Misc Natural Products) .Marland Kitchen.. 1 By Mouth Two Times A Day 15)  Aspirin 81 Mg  Tabs (Aspirin) .... Take 1 Tablet By Mouth As Directed 16)  Lorazepam 1 Mg  Tabs (Lorazepam) .... Take 1  To 1.5 Tablet By Mouth Daily If Needed For Sleep 17)  Allergy Vaccine Go (W-E) .... Restart New Mix 18)  Cyclobenzaprine Hcl 10 Mg  Tabs (Cyclobenzaprine Hcl) .... As Needed 19)  Promethazine Hcl 25 Mg  Tabs (Promethazine Hcl) .... As Needed 20)  Hydrocodone-Acetaminophen 5-500 Mg  Tabs (Hydrocodone-Acetaminophen) .... As Needed 21)  Epipen 0.3 Mg/0.63ml (1:1000) Devi (Epinephrine Hcl (Anaphylaxis)) .... For Severe Allergic Reaction 22)  Doxycycline Hyclate 100 Mg Tabs (Doxycycline Hyclate) .... 2 Tabs Today Then One Daily 23)  Gabapentin 600 Mg Tabs (Gabapentin) .Marland Kitchen.. 1 At Bedtime  As Needed 24)  Hyoscyamine Sulfate Cr 0.375 Mg Xr12h-Tab (Hyoscyamine Sulfate) .Marland Kitchen.. 1 or 2 Tablets Every 12 Hours 25)  Zolpidem Tartrate 10 Mg Tabs (Zolpidem Tartrate) .Marland Kitchen.. 1 For Sleep If Needed 26)  Folic Acid 800 Mcg Tabs (Folic Acid) .Marland Kitchen.. 1 By Mouth Once Daily 27)  Fish Oil 1000 Mg Caps  (Omega-3 Fatty Acids) .Marland Kitchen.. 1 By Mouth Two Times A Day 28)  Thiamine Hcl 100 Mg Tabs (Thiamine Hcl) .Marland Kitchen.. 1 By Mouth Once Daily  Allergies: 1)  ! * Resorinol (Found in Acne & Dandruff Products) 2)  ! 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 ALLERGIC RHINITIS (ICD-477.9)- update skin test 05/25/09 Chronic insomnia Renal Artery Aneurysm , Dr Ramon Dredge , [place]ly[place]oy  Past Surgical History: BACK SURGERY-L4-5 FUSION(06/2000); HERNIA REPAIR CYSTOSCOPY (12/2002) C4-7 fusion (08/2003) Lumbar fusion L2-3 in 06/2008; Ankle fusion post fracture 2008; G 2 P 2; Umbilical hernia surgery Appendectomy Tonsillectomy Tib/ Fib fracture repair 12/2009, Dr Victorino Dike Tubal ligation Colonoscopy 2004: bowel redundency  Family History: Father:MI @ 56, HTN Mother: Dementia Paternal uncles:  CVA Maternal aunt :  Breast cancer Maternal aunts 3 or 4:  Alzheimer's M Cousins x 2 :  Breast cancer  Social History: lives alone, has two children.  She is widowed.   Patient states former smoker. She quit  1969;  prior to that she smoked for 10 yrs at 1-3ppd. 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 Alcohol use-yes: socially Regular exercise-no Packs/Day:  1-3 Caffeine use/day:  none Dental Care w/in 6 mos.:  no Sun Exposure-Excessive:  no Seat Belt Use:  yes Blood Transfusions:  yes, after 2001  Physical Exam  General:  Well-developed,well-nourished,in no acute distress; alert,appropriate and cooperative throughout examination Neck:  No deformities, masses, or tenderness  noted. Lungs:  Normal respiratory effort, chest expands symmetrically. Lungs are clear to auscultation, no crackles or wheezes. Heart:  Normal rate and regular rhythm. S1 and S2 normal without gallop, murmur, click, rub . S4 Abdomen:  Bowel sounds positive,abdomen soft and non-tender without masses, organomegaly or hernias noted. No AAA Pulses:  R and L carotid,radial  pulses are full and equal bilaterally. LLE pulses intake  Extremities:  No clubbing, cyanosis. Minor DIP finger changes. RLE in walking boot Neurologic:  alert & oriented X3, strength normal in all extremities, and  UE DTRs symmetrical and normal.   Skin:  Intact without suspicious lesions or rashes Cervical Nodes:  No lymphadenopathy noted Axillary Nodes:  No palpable lymphadenopathy Psych:  memory intact for recent and remote, normally interactive, and good eye contact.     Impression & Recommendations:  Problem # 1:  PREVENTIVE HEALTH CARE (ICD-V70.0)  Orders: Medicare -1st Annual Wellness Visit [phone])  Problem # 2:  HYPERTENSION (ICD-401.9)  Her updated medication list for this problem includes:    Cozaar 100 Mg Tabs (Losartan potassium) .Marland Kitchen... Take 1 tablet by mouth once a day    Spironolactone 25 Mg Tabs (Spironolactone) .Marland Kitchen... Take one tablet daily    Verapamil Hcl Cr 180 Mg Tbcr (Verapamil hcl) .Marland Kitchen... 1 by mouth qd  Orders: Venipuncture (98119) Specimen Handling (14782) TLB-BMP (Basic Metabolic Panel-BMET) (80048-METABOL)  Problem # 3:  OTHER AND UNSPECIFIED HYPERLIPIDEMIA (ICD-272.4)  Her updated medication list for this problem includes:    Lipitor 40 Mg Tabs (Atorvastatin calcium) .Marland Kitchen... Take 1 tablet by mouth once a day  Orders: Specimen Handling (95621) TLB-Lipid Panel (80061-LIPID) TLB-Hepatic/Liver Function Pnl (80076-HEPATIC) TLB-TSH (Thyroid Stimulating Hormone) (84443-TSH)  Problem # 4:  IBS (ICD-564.1)  Orders: Venipuncture (30865) Specimen Handling (78469) TLB-CBC Platelet -  w/Differential (85025-CBCD)  Problem # 5:  OSTEOPENIA (ICD-733.90)  Her updated medication list for this problem includes:    Fosamax 70 Mg Tabs (Alendronate sodium) .Marland Kitchen... 1 tab q week  Orders: Venipuncture (62952) T-Vitamin  D (25-Hydroxy) [phone])  Complete Medication List: 1)  Cozaar 100 Mg Tabs (Losartan potassium) .... Take 1 tablet by mouth once a day 2)  Aciphex 20 Mg Tbec (Rabeprazole sodium) .Marland Kitchen.. 1 by mouth once daily 3)  Fosamax 70 Mg Tabs (Alendronate sodium) .Marland Kitchen.. 1 tab q week 4)  Lipitor 40 Mg Tabs (Atorvastatin calcium) .... Take 1 tablet by mouth once a day 5)  Spironolactone 25 Mg Tabs (Spironolactone) .... Take one tablet daily 6)  Celebrex 200 Mg Caps (Celecoxib) .... Take one capsule once daily as needed,avoid daily use due to gi & cardiac potential adverse effects 7)  Verapamil Hcl Cr 180 Mg Tbcr (Verapamil hcl) .Marland Kitchen.. 1 by mouth qd 8)  Lexapro 20 Mg Tabs (Escitalopram oxalate) .... Take one tablet once daily 9)  Allegra Allergy 180 Mg Tabs (Fexofenadine hcl) .Marland Kitchen.. 1 by mouth once daily 10)  Multivitamin W/450mg  Calcium & 200 Iu Vitamin D  .... Take 1 tablet by mouth two times a day as directed 11)  Vitamin C 1000 Mg Tabs (Ascorbic acid) .Marland Kitchen.. 1 by mouth once daily 12)  Calcium Supplement 600mg  W/400 Iu Vitamin D  .... Take by mouth two times a day 13)  Ferrous Sulfate 325 (65 Fe) Mg Tabs (Ferrous sulfate) .Marland Kitchen.. 1 by mouth once daily 14)  Osteo Bi-flex Adv Triple St Tabs (Misc natural products) .Marland Kitchen.. 1 by mouth two times a day 15)  Aspirin 81 Mg Tabs (Aspirin) .... Take 1 tablet by mouth as directed 16)  Lorazepam 1 Mg Tabs (Lorazepam) .... Take 1  to 1.5 tablet by mouth daily if needed for sleep 17)  Allergy Vaccine Go (w-e)  .... Restart new mix 18)  Cyclobenzaprine Hcl 10 Mg Tabs (Cyclobenzaprine hcl) .... As needed 19)  Promethazine Hcl 25 Mg Tabs (Promethazine hcl) .... As needed 20)  Hydrocodone-acetaminophen 5-500 Mg Tabs (Hydrocodone-acetaminophen) .... As  needed 21)  Epipen 0.3 Mg/0.20ml (1:1000) Devi (Epinephrine hcl (anaphylaxis)) .... For severe allergic reaction 22)  Doxycycline Hyclate 100 Mg Tabs (Doxycycline hyclate) .... 2 tabs today then one daily 23)  Gabapentin 600 Mg Tabs (Gabapentin) .Marland Kitchen.. 1 at bedtime  as needed 24)  Hyoscyamine Sulfate Cr 0.375 Mg Xr12h-tab (Hyoscyamine sulfate) .Marland Kitchen.. 1 or 2 tablets every 12 hours 25)  Zolpidem Tartrate 10 Mg Tabs (Zolpidem tartrate) .Marland Kitchen.. 1 for sleep if needed 26)  Folic Acid 800 Mcg Tabs (Folic acid) .Marland Kitchen.. 1 by mouth once daily 27)  Fish Oil 1000 Mg Caps (Omega-3 fatty acids) .Marland Kitchen.. 1 by mouth two times a day 28)  Thiamine Hcl 100 Mg Tabs (Thiamine hcl) .Marland Kitchen.. 1 by mouth once daily  Lipid Assessment/Plan:      Based on NCEP/ATP III, the patient's risk factor category is "2 or more risk factors and a calculated 10 year CAD risk of < 20%".  The patient's lipid goals are as follows: Total cholesterol goal is 200; LDL cholesterol goal is 130; HDL cholesterol goal is 40; Triglyceride goal is 150.  Her LDL cholesterol goal has been met.    Patient Instructions: 1)  Stop Fosamax; there is no benefit in therapy beyond 5 years. 2)  Check your Blood Pressure regularly. If it is above: 135/85 ON AVERAGE  you should make an appointment. Note : on Vitamin D 1600  International Units  daily @ present. Minimaize use of Cyclobenzaprine , Promethazine, Zolpidem  & Celebrex because of adverse drug reaction risks.Take the Probiotic Align once daily if IBS(spastic colon ) symptoms are present. Continue the Calcium &  vit D3 supplements , but stop other supplements.   Orders Added: 1)  Medicare -1st Annual Wellness Visit [G0438] 2)  Est. Patient Level III [16109] 3)  Venipuncture [60454] 4)  T-Vitamin D (25-Hydroxy) [phone] 5)  Specimen Handling [99000] 6)  TLB-Lipid Panel [80061-LIPID] 7)  TLB-BMP (Basic Metabolic Panel-BMET) [80048-METABOL] 8)  TLB-CBC Platelet - w/Differential [85025-CBCD] 9)  TLB-Hepatic/Liver  Function Pnl [80076-HEPATIC] 10)  TLB-TSH (Thyroid Stimulating Hormone) [84443-TSH]  Appended Document: cpx/fasting/kn NMR 2004:LDL [id number]), HDL 77, TG 62. LDL goal = < 140.

## 2010-03-30 NOTE — Miscellaneous (Signed)
Summary: [facility] plan of care  [facility] plan of care   Imported By: Kassie Mends 03/24/2010 08:47:04  _____________________________________________________________________  External Attachment:    Type:   Image     Comment:   External Document

## 2010-03-30 NOTE — Miscellaneous (Signed)
Summary: Certification and Plan of Care/[facility]  Certification and Plan of Care/[facility]   Imported By: Maryln Gottron 03/20/2010 13:16:29  _____________________________________________________________________  External Attachment:    Type:   Image     Comment:   External Document

## 2010-04-25 LAB — CBC
HCT: 30.4 % — ABNORMAL LOW (ref 36.0–46.0)
HCT: 35.3 % — ABNORMAL LOW (ref 36.0–46.0)
Hemoglobin: 10.8 g/dL — ABNORMAL LOW (ref 12.0–15.0)
Hemoglobin: 11.8 g/dL — ABNORMAL LOW (ref 12.0–15.0)
Hemoglobin: 9.8 g/dL — ABNORMAL LOW (ref 12.0–15.0)
MCH: 29.6 pg (ref 26.0–34.0)
MCH: 29.7 pg (ref 26.0–34.0)
MCHC: 32.2 g/dL (ref 30.0–36.0)
MCHC: 32.4 g/dL (ref 30.0–36.0)
MCHC: 33.1 g/dL (ref 30.0–36.0)
MCV: 91.6 fL (ref 78.0–100.0)
MCV: 91.8 fL (ref 78.0–100.0)
Platelets: 196 10*3/uL (ref 150–400)
Platelets: 212 10*3/uL (ref 150–400)
RBC: 3.31 MIL/uL — ABNORMAL LOW (ref 3.87–5.11)
RBC: 3.61 MIL/uL — ABNORMAL LOW (ref 3.87–5.11)
RBC: 4.02 MIL/uL (ref 3.87–5.11)
RDW: 13.8 % (ref 11.5–15.5)
WBC: 7.3 10*3/uL (ref 4.0–10.5)

## 2010-04-25 LAB — COMPREHENSIVE METABOLIC PANEL
ALT: 14 U/L (ref 0–35)
ALT: 17 U/L (ref 0–35)
AST: 20 U/L (ref 0–37)
Albumin: 3.3 g/dL — ABNORMAL LOW (ref 3.5–5.2)
Albumin: 3.6 g/dL (ref 3.5–5.2)
Alkaline Phosphatase: 42 U/L (ref 39–117)
CO2: 30 mEq/L (ref 19–32)
Calcium: 9.1 mg/dL (ref 8.4–10.5)
Chloride: 99 mEq/L (ref 96–112)
GFR calc Af Amer: >60 mL/min (ref >60)
GFR calc Af Amer: >60 mL/min (ref >60)
GFR calc non Af Amer: >60 mL/min (ref >60)
Potassium: 3.9 mEq/L (ref 3.5–5.1)
Sodium: 129 mEq/L — ABNORMAL LOW (ref 135–145)
Sodium: 137 mEq/L (ref 135–145)
Total Protein: 6 g/dL (ref 6.0–8.3)

## 2010-04-25 LAB — CARDIAC PANEL(CRET KIN+CKTOT+MB+TROPI)
CK, MB: 3.9 ng/mL (ref 0.3–4.0)
CK, MB: 5.4 ng/mL — ABNORMAL HIGH (ref 0.3–4.0)
Relative Index: 2.4 (ref 0.0–2.5)
Relative Index: 2.6 — ABNORMAL HIGH (ref 0.0–2.5)
Total CK: 165 U/L (ref 7–177)

## 2010-04-25 LAB — BASIC METABOLIC PANEL
BUN: 10 mg/dL (ref 6–23)
BUN: 3 mg/dL — ABNORMAL LOW (ref 6–23)
CO2: 24 mEq/L (ref 19–32)
CO2: 30 mEq/L (ref 19–32)
CO2: 31 mEq/L (ref 19–32)
Calcium: 8.5 mg/dL (ref 8.4–10.5)
Calcium: 8.9 mg/dL (ref 8.4–10.5)
Chloride: 92 mEq/L — ABNORMAL LOW (ref 96–112)
Chloride: 93 mEq/L — ABNORMAL LOW (ref 96–112)
Chloride: 96 mEq/L (ref 96–112)
Creatinine, Ser: 0.56 mg/dL (ref 0.4–1.2)
Creatinine, Ser: 0.57 mg/dL (ref 0.4–1.2)
GFR calc Af Amer: >60 mL/min (ref >60)
Glucose, Bld: 113 mg/dL — ABNORMAL HIGH (ref 70–99)
Glucose, Bld: 121 mg/dL — ABNORMAL HIGH (ref 70–99)
Sodium: 131 mEq/L — ABNORMAL LOW (ref 135–145)

## 2010-04-25 LAB — CK TOTAL AND CKMB (NOT AT ARMC)
CK, MB: 1.8 ng/mL (ref 0.3–4.0)
Total CK: 177 U/L (ref 7–177)
Total CK: 55 U/L (ref 7–177)

## 2010-04-25 LAB — RAPID URINE DRUG SCREEN, HOSP PERFORMED
Cocaine: NOT DETECTED
Opiates: NOT DETECTED
Tetrahydrocannabinol: NOT DETECTED

## 2010-04-25 LAB — POCT CARDIAC MARKERS
CKMB, poc: <1 ng/mL — ABNORMAL LOW (ref 1.0–8.0)
CKMB, poc: <1 ng/mL — ABNORMAL LOW (ref 1.0–8.0)
Troponin i, poc: <0.05 ng/mL (ref 0.00–0.09)

## 2010-04-25 LAB — URINALYSIS, ROUTINE W REFLEX MICROSCOPIC
Bilirubin Urine: NEGATIVE
Glucose, UA: NEGATIVE mg/dL
Nitrite: NEGATIVE
Specific Gravity, Urine: 1.01 (ref 1.005–1.030)
pH: 5.5 (ref 5.0–8.0)

## 2010-04-25 LAB — DIFFERENTIAL
Basophils Absolute: 0 10*3/uL (ref 0.0–0.1)
Eosinophils Relative: 1 % (ref 0–5)
Lymphocytes Relative: 24 % (ref 12–46)
Neutro Abs: 5 10*3/uL (ref 1.7–7.7)

## 2010-04-25 LAB — APTT: aPTT: 29 seconds (ref 24–37)

## 2010-04-25 LAB — BRAIN NATRIURETIC PEPTIDE: Pro B Natriuretic peptide (BNP): 30 pg/mL (ref 0.0–100.0)

## 2010-04-25 LAB — TSH: TSH: 0.962 u[IU]/mL (ref 0.350–4.500)

## 2010-04-25 LAB — TROPONIN I: Troponin I: 0.01 ng/mL (ref 0.00–0.06)

## 2010-04-25 LAB — MAGNESIUM: Magnesium: 1.7 mg/dL (ref 1.5–2.5)

## 2010-04-25 LAB — LIPID PANEL
Cholesterol: 140 mg/dL (ref 0–200)
LDL Cholesterol: 62 mg/dL (ref 0–99)

## 2010-04-25 LAB — ETHANOL: Alcohol, Ethyl (B): 229 mg/dL — ABNORMAL HIGH (ref 0–10)

## 2010-05-16 ENCOUNTER — Other Ambulatory Visit: Payer: Self-pay | Admitting: Internal Medicine

## 2010-05-25 ENCOUNTER — Ambulatory Visit (INDEPENDENT_AMBULATORY_CARE_PROVIDER_SITE_OTHER): Payer: Medicare Other

## 2010-05-25 ENCOUNTER — Other Ambulatory Visit: Payer: Self-pay | Admitting: Internal Medicine

## 2010-05-25 DIAGNOSIS — J309 Allergic rhinitis, unspecified: Secondary | ICD-10-CM

## 2010-06-06 ENCOUNTER — Other Ambulatory Visit: Payer: Self-pay | Admitting: *Deleted

## 2010-06-06 MED ORDER — CELECOXIB 200 MG PO CAPS: ORAL_CAPSULE | ORAL | Status: DC

## 2010-06-07 ENCOUNTER — Other Ambulatory Visit: Payer: Self-pay | Admitting: Internal Medicine

## 2010-06-08 ENCOUNTER — Other Ambulatory Visit: Payer: Self-pay | Admitting: *Deleted

## 2010-06-08 MED ORDER — LORAZEPAM 1 MG PO TABS: ORAL_TABLET | ORAL | Status: DC

## 2010-06-27 NOTE — Op Note (Signed)
Hailey [name], Hailey [name]                 ACCOUNT NO.:  [id number]   MEDICAL RECORD NO.:  [id number]          PATIENT TYPE:  INP   LOCATION:  1533                         FACILITY[facility]  [facility]   PHYSICIAN:  Alvy Beal, MD    DATE OF BIRTH:  04-Apr-1940   DATE OF PROCEDURE:  DATE OF DISCHARGE:                               OPERATIVE REPORT   PREOPERATIVE DIAGNOSIS:  Left trimalleolar ankle fracture-dislocation.   POSTOPERATIVE DIAGNOSIS:  Left trimalleolar ankle fracture-dislocation.   OPERATIVE PROCEDURE:  Open reduction and internal fixation, left ankle.   INSTRUMENTATION USED:  Synthes large small-fragment set, a 7-hole one-  third tubular plate placed on the fibula with appropriate cancellous and  cortical screws, and medial side were 4.5 cannulated screws with  washers.   COMPLICATIONS:  None.   CONDITION:  Stable.   HISTORY:  This is a pleasant 70 year old woman who presented to the  emergency room earlier this morning and complained of significant left  ankle pain after a fall.  There was a gross instability with inability  to maintain the reduction closed.  After discussing treatment options  including the risks (infection, bleeding, nerve damage, death, stroke,  paralysis, failure to heal, and need for further surgery, hardware  complications, and fracture), the patient consented to the surgery.  All  appropriate risks, benefits, and alternatives were re-reviewed this  morning.  The side was identified and marked.   The patient was brought to the operating room and placed supine on the  operating table.  After the successful induction of general anesthesia  and endotracheal intubation, TEDs, SCDs were placed on the right lower  extremity and the patient was brought to the operating room.  She was  placed supine on the operating table.  After successful induction of  general anesthesia and endotracheal intubation, a tourniquet was placed  on the left thigh and the left lower  extremity was prepped and draped in  a standard fashion.   Using an Esmarch, we exsanguinated the leg and inflated the tourniquet  to 300 mmHg.  A lateral incision was then made starting at the inferior  aspect of the fibula and proceeding across the fractured joint and up  beyond the fracture site.  Sharp dissection using a 15 blade knife was  used to dissect through the subcutaneous tissue down to the bone.  The  fracture site was identified and the periosteum was excised.  Using a  curette, we removed all loose bone fragments and hematoma and irrigated  the fracture site.  We then contoured a one-third tubular plate and  placed it on the lateral aspect of the fibula and reduced the fracture  and clamped the plate.  Both to direct observation and fluoroscopic  evaluation, the fracture was well-reduced.  We then secured the one-  third tubular plate proximally with 12-mm cortical screws and distally  with 16-mm cortical screws and then two 16-mm cancellous screws.  The x-  rays intraoperatively were satisfactory.  There was no intra-articular  extension of the screws, and the fibula was anatomically reduced.   We then  irrigated the wound copiously with normal saline and then closed  the superficial fascia with 2-0 Vicryl sutures and the skin with a  running 2-0 nylon vertical mattress suture.   At this point we then made a reverse hockey stick incision starting at  the superior aspect of the medial malleolus and extending inferiorly.  We then dissected down to expose the fracture site.  I removed the  entrapped periosteum and hematoma and bone fragments.  I irrigated the  fracture site and then closed-reduced it.  Then looking at the superior  articular margin, I noticed I had an anatomical reduction.  This was  held in place and two guide pins were advanced from the tip of the  medial malleolus across the fracture site and into the distal tibia.  We  confirmed position in the AP,  mortise and lateral views and then used  the cannulated drill to overdrill and then placed two 4.5 cannulated  screws.  Washers were placed on these screws.   The x-rays were satisfactory, AP, lateral and mortise views.  At this  point we then closed the skin with 2-0 horizontal mattress sutures.  Marcaine 0.25% was used to anesthetize both sounds and provide an intra-  articular block.   Xeroform and a bulky dressing was applied and then a Quincy Simmonds  dressing was applied.  A posterior splint with side stretch was then  applied and allowed to harden.  The patient was then extubated and  transferred to the PACU without incident.  At the end of the case all  needle and sponge counts were correct.   The patient will be admitted to the hospital throughout the course of  the weekend, will have a case manager, rehab consult for most likely an  inpatient rehab discharge.  We will remove the splint and check the  wound in 2 weeks.      Alvy Beal, MD  Electronically Signed     DDB/MEDQ  D:  08/16/2006  T:  08/16/2006  Job:  [id number]

## 2010-06-27 NOTE — Discharge Summary (Signed)
Hailey [name], Hailey [name]                 ACCOUNT NO.:  [id number]   MEDICAL RECORD NO.:  [id number]          PATIENT TYPE:  INP   LOCATION:  1533                         [facility]AC[facility]   PHYSICIAN:  Alvy Beal, MD    DATE OF BIRTH:  1940/08/22   DATE OF ADMISSION:  08/15/2006  DATE OF DISCHARGE:                               DISCHARGE SUMMARY   PENDING DISCHARGE:  August 20, 2006.   ADMITTING DIAGNOSIS:  Left trimalleolar ankle fracture dislocation.   DISCHARGE DIAGNOSIS:  Left trimalleolar ankle fracture dislocation.   SECONDARY MEDICAL ISSUES:  Hypertension.   HISTORY:  Hailey [name] is a very pleasant 70 year old woman who slipped and  fell and presented to the emergency room.  This occurred on August 15, 2006, but I was contacted and evaluated her in the early morning of August 16, 2006.  She is noted to have a fracture dislocation.  Attempts at  closed reduction had failed.  As a result, she was admitted and taken to  the operating room later on that same morning for open reduction  internal fixation.  The patient tolerated the procedure well, and has  remained medically stable.  Her pain is well controlled.  She is  starting to be able to ambulate, nonweightbearing, but she is having  great difficulty.  She has been evaluated by the PT/0T service as well  as the rehab service.   Her pain is controlled with p.o. medications.  At this point given the  fact that she lives alone and she is 70 years of age, I do not think  that she will be able to ambulate nonweightbearing at home by herself.  I think the best course of action is a short-term rehab center and have  recommended [facility].  We will go ahead and set up this  discharge.   DISCHARGE INSTRUCTIONS:  She remains strict, nonweightbearing on the  left lower extremity; and when she is not ambulating the left lower  extremity should be elevated as much as possible.  She will follow up  with me two weeks total from the time  of injury (which would be two  weeks from the 4th) for a wound check, removal of the splint, and  conversion to a short-leg cast.  I would like to see the patient on July  18, that would be two weeks from the time of injury.   In addition, to all of her home medications.  We will start her on  Percocet for pain control.   In terms of her home meds based on her reconciliation chart, she should  be on:  1. Fexofenadine 60 mg.  2. Flaxseed.  3. Cozaar.  4. Aciphex.  5. Lexapro.  6. Multivitamin.  7. Calcium.  8. Vitamin C.  9. Iron pill.  10.Aspirin.  11.Potassium gluconate.  12.Lorazepam for anxiety.   She is also taking and I would continue to take the:  1. Verapamil.  2. Lipitor.  3. Ambien.  4. Fosamax.  5. Glucosamine.  6. Chondroitin sulfate.  7. Spironolactone.  8. Hydroxy__________  sulfate.   In terms of her p.r.n. medications I do not think that she needs to be  on any of those.  Those include:  1. Doxycycline.  2. Ultram.  3. Tylenol.  4. Phenergan.  5. Cyclobenzaprine.      Alvy Beal, MD  Electronically Signed     DDB/MEDQ  D:  08/20/2006  T:  08/20/2006  Job:  [id number]

## 2010-06-27 NOTE — H&P (Signed)
NAME:  [name], [name] NO.:  [id number]   MEDICAL RECORD NO.:  [id number]          PATIENT TYPE:  EMS   LOCATION:  ED                           FACILITY:  [facility]   PHYSICIAN:  Alvy Beal, MD    DATE OF BIRTH:  April 27, 1940   DATE OF ADMISSION:  08/15/2006  DATE OF DISCHARGE:                              HISTORY & PHYSICAL   HISTORY:  Hailey [name] is a very pleasant 70 year old woman who presents with a  complaint of left ankle deformity.  The patient indicates that earlier  this evening around 9:00 P.M. she lost her footing and slipped and  rolled her ankle.  She kind of hobbled around on this complaining of  significant pain.  She was ultimately brought to the emergency  department by ambulance because of increasing ankle pain on the left  side.  X-rays done in the ER demonstrated an ankle fracture dislocation  and as a result, an orthopedic consultation was requested.   Upon arrival in the ER the patient was here, she was alert and oriented  x3 and was cooperative.   PAST MEDICAL HISTORY:  Her past medical, family, surgical, social  history includes hypertension, cardiac arrhythmia, hyperlipidemia,  osteoarthritis, osteoporosis.  She has had a spinal injury and cervical  and lumbar spinal fusions.  She has significant osteoporosis for which  she takes multiple anti-reabsorptive medications, calcium, and vitamin  D.   In the past she has had osteoporotic fractures of the pelvis.   She is a nonsmoker, nondrinker and she denies illicit drug use.   PHYSICAL EXAMINATION:  GENERAL/CLINICAL EXAM:  She is a pleasant woman  who appears her stated age in no acute distress.  She is alert and  oriented x3.  MUSCULOSKELETAL:  She has no knee pain. No groin pain.  ABDOMEN:  Soft and nontender.  LUNGS:  She has no shortness of breath.  HEART:  No chest pain.  EXTREMITIES:  The EHL is intact in the left lower extremity.  She has  intact dorsalis pedis pulse and posterior  tibialis pulses.  Capillary  refill is less than 2 seconds in all of her toes.  She has an obvious  deformity with a closed injury.  No evidence of any skin breech.  She  has no other complaints.   CLINICAL DATA:  X-rays demonstrate a trimalleolar ankle fracture  dislocation.  There is a posterior medial malleolar fragment and a Weber  B fibular fracture.   At this point in time a hematoma block was done under sterile conditions  and I was able to obtain reduction.  There was an audible reduction.  The patient remained neurovascularly intact.  I placed her in a splint  with side struts.  However, x-rays at that time demonstrated that we  were unable to maintain the reduction.  As a result, I repeated the  reduction.  Again, I was able to get her to be reduced, however, the  post reduction x-rays demonstrate that this is an unstable fracture and  she is still displaced out laterally.  At this point  in time the tibial  talar joint is reduced but the ankle is obviously unstable.  I think at  this point rather than traumatizing the soft tissues further with  ongoing  reduction maneuvers, we will keep her stable and plan on definitive  fracture management in the morning.  The current time is 3:15.  Will  plan on admitting her, giving her pain control and hopefully affixing  the fracture in the next three to four hours.  This was explained to the  patient.  All of her questions and concerns were addressed.      Alvy Beal, MD  Electronically Signed     DDB/MEDQ  D:  08/16/2006  T:  08/16/2006  Job:  [id number]

## 2010-06-30 NOTE — Assessment & Plan Note (Signed)
[facility]                             PULMONARY OFFICE NOTE   MAILLE, HALLIWELL                        MRN:          [id number]  DATE:06/13/2006                            DOB:          1940/10/19    PROBLEM:  1. Obstructive sleep apnea (9 per hour).  2. Insomnia/depression.  3. Allergic rhinitis.  4. Esophageal reflux.   HISTORY:  One year followup.  No pollen problems.  She is doing well on  vaccine at 1:10.  A little bit of sneezing.  She has a cat and says that  it brings in enough pollen to turn yellow.  I discussed environmental  precautions and expectations but she is satisfied with the current  management, just asking refill of fexofenadine.  Her husband died last  year of terminal COPD and she admits this is substantially reduced her  stress.  She is sleeping somewhat better.  She prefers Ambien 10 mg cut  in half, so she can use 1/2 or 1, together with lorazepam 1 mg at  bedtime and feels very comfortable with this approach.  We discussed  sleep hygiene and behavioral options.   MEDICATION:  1. Fexofenadine 60 mg.  2. Cozaar 50 mg.  3. Hydrochlorothiazide 25 mg.  4. Celebrex 200 mg b.i.d.  5. AcipHex 20 mg or Prevacid.  6. Lexapro 20 mg.  7. Multivitamins.  8. Aspirin 81 mg.  9. Lorazepam 1 mg t.i.d. p.r.n.  10.Verapamil 180 mg.  11.Lipitor 20 mg.  12.Ambien CR 12.5 mg  has been used p.r.n.  but she favors the plain      10 mg tab as noted.  13.P.r.n. use of cyclobenzaprine.   OBJECTIVE:  Weight 153 pounds, BP 118/70, pulse is 81, room air  saturation 94%.  She is alert, more relaxed, less stressed and smiling more easily than I  had seen her in the past.  Nasal airways does not appear obstructed.  Speech quality is normal.  There is no postnasal drip, conjunctivae are clear.  LUNGS:  Are clear.   IMPRESSION:  Allergic rhinitis with minimal seasonal exacerbation.  Minimal sleep apnea with main issue with being insomnia,  previously  related to stress.   PLAN:  Continue vaccine.  We refilled Ambien 10 mg, fexofenadine 60 mg  for b.i.d.  p.r.n.  use and lorazepam 1 mg.  Schedule return 1 year,  come earlier p.r.n.     Clinton D. Maple Hudson, MD, Tonny Bollman, FACP  Electronically Signed    CDY/MedQ  DD: 06/13/2006  DT: 06/14/2006  Job #: [id number]   cc:   Titus Dubin. Alwyn Ren, MD,FACP,FCCP

## 2010-06-30 NOTE — H&P (Signed)
NAME:  Hailey [name], Hailey [name]                 ACCOUNT NO.:  [id number]   MEDICAL RECORD NO.:  [id number]          PATIENT TYPE:  EMS   LOCATION:  MINO                         FACILITY:  [facility]   PHYSICIAN:  Thomos Lemons, D.O. [facility]   DATE OF BIRTH:  26-May-1940   DATE OF ADMISSION:  09/22/2004  DATE OF DISCHARGE:                                HISTORY & PHYSICAL   CHIEF COMPLAINT:  Syncope, status post fall.   PRIMARY CARE PHYSICIAN:  Titus Dubin. Alwyn Ren, M.D.   HISTORY OF PRESENT ILLNESS:  The patient is a 70 year old white female with  a previous history of syncopal episodes who presents with a fall.  The  patient states that an episode last night around 11 p.m. in the patient's  bathroom.  The patient does not recall the event and woke up on the floor of  the bathroom.  She states she woke up in a pool of blood with facial pain  and left hip pain.  Apparently she then crawled back into bed.  Her husband  was unaware of these events.  He normally wears a hearing aid, and states  that he did not hear her fall and come back to bed.  The patient then awoke  at 1 p.m. to walk to the bathroom, using a flashlight.  Her husband then  awoke and went to help his wife.  She then collapsed to the floor and he  helped her back into bed.  He states that he offered to call the ambulance,  but she refused.  They fell asleep and she awoke this morning with severe  pain in her left hip and face.  The patient normally has a nurse who comes  by and who examined her this morning and urgently recommended a hospital  evaluation.  The patient has been recently evaluated for syncope by Dr.  Clarene Duke, and she underwent a Cardiolite study in January 2006, that was  negative for ischemia, and showed a 79% ejection fraction.  An  echocardiogram was also performed on that day and showed moderate left  ventricular hypertrophy and a normal ejection fraction with mild diastolic  dysfunction.  She is noted to have a normal exercise  tolerance and there was  no obvious finding on electrocardiogram, to explain her syncope.  She denies  ever having a Holter monitor or event recorder in the past.   PAST MEDICAL HISTORY:  1.  Left iliac wing fracture.  2.  Hypertension.  3.  Osteoporosis/osteopenia.  4.  Hyperlipidemia.  5.  Possible obstructive sleep apnea.  6.  Gastroesophageal reflux disease.   PAST SURGICAL HISTORY:  1.  Status post tubal ligation.  2.  Status post umbilical hernia repair.  3.  Spinal C-spine fusion surgery.  4.  Lumbar fusion surgery at L4-L5.  Her orthopedic physician is Dr. Mila Homer. Lucey.   FAMILY HISTORY:  Significant for heart disease in her father.   SOCIAL HISTORY:  Remote tobacco for 35 years.  The patient does drink every  night.  She notes two glasses of wine  per evening.  The patient's husband  notes greater alcohol intake of up to four to five glasses.  The patient  lives with her husband.   ALLERGIES:  RESORCINOL.   CURRENT MEDICATIONS:  1.  Cozaar 50 mg b.i.d.  2.  Hydrochlorothiazide/triamterene 25/37.5 mg once daily.  3.  Celebrex 200 mg once daily.  4.  Aciphex 20 mg once daily.  5.  Lexapro 10 mg once daily.  6.  Aspirin 81 mg once daily.  7.  Allegra 60 mg b.i.d.   LABORATORY DATA:  Was not available.   RADIOLOGIC STUDIES:  The patient had a lumbar x-ray which shows previous L4-  L5 fusion.  No acute findings.  X-ray of the pelvis shows old trauma of the  left pelvis and bilateral hip arthritis, but no acute fractures.  A CT of  the head and face shows a hematoma of left face.  No fracture of the head, C-  spine or face, status post previous C-spine fusion.   PHYSICAL EXAMINATION:  VITAL SIGNS:  At the time of this dictation the vital  signs were not obtained by the emergency room staff.  GENERAL:  The patient is a 70 year old white female, awake, alert and  oriented x3.  HEENT:  Pupils 1 mm bilaterally, somewhat sluggish.  Positive ecchymosis  with  edema around the left orbit and positive lower lip laceration.  No  active bleeding.  No evidence of tongue laceration.  NECK:  No lymphadenopathy or carotid bruit.  LUNGS:  Chest is clear to auscultation bilaterally.  No rhonchi, rales or  wheezing.  Normal inspiratory effort.  CARDIOVASCULAR:  A regular rate and rhythm.  No significant murmurs, rubs or  gallops appreciated.  ABDOMEN:  Soft, nontender, with positive bowel sounds.  EXTREMITIES:  No edema.  Positive pain with palpation over the left hip.  The patient is able to move the bilateral toes without difficulty.  NEUROLOGIC:  Cranial nerves II-XII  grossly intact.  No focal deficits.  SKIN:  Warm and dry.   ASSESSMENT:  1.  Status post syncopal episode.  2.  Left hip pain.  3.  Left orbital contusion.  4.  Hypertension.  5.  History of osteopenia.  6.  History of alcohol use.   RECOMMENDATIONS:  Based upon the clinical examination and radiographic  studies, she does not seem to have any acute fractures, but has significant  pain due to a recent trauma.  We will hospitalize the patient and provide  pain control.  In regards to her syncopal episode, we will monitor her on  telemetry.  We will obtain a repeat electrocardiogram and order an  electroencephalogram during her hospitalization, to rule out any possibility  of seizure activity.  The patient does not have any report of tongue biting  or bladder incontinence, but is certainly a consideration with the past  medical history of falls and possible head injury in the past.   We will decrease her Cozaar to 50 mg, 1/2 tab b.i.d. for now, and hold her  hydrochlorothiazide until laboratory data is obtained.      Thomos Lemons, D.O. [facility]  Electronically Signed     RY/MEDQ  D:  09/22/2004  T:  09/22/2004  Job:  [id number]   cc:   Titus Dubin. Alwyn Ren, M.D. [facility]  [phone] [place]  [place] [address]

## 2010-06-30 NOTE — Op Note (Signed)
NAME:  Hailey [name], Hailey [name]                           ACCOUNT NO.:  [id number]   MEDICAL RECORD NO.:  [id number]                   PATIENT TYPE:  AMB   LOCATION:  NESC                                 FACILITY:  [facility]   PHYSICIAN:  Ronald L. Ovidio Hanger, M.D.           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   OPERATION:  Cystourethroscopy, HOD, and bladder biopsy.   SURGEON:  Lucrezia Starch. Earlene Plater, M.D.   ANESTHESIA:  LMA.   ESTIMATED BLOOD LOSS:  Negligible.   TUBES:  None.   COMPLICATIONS:  None.   INDICATIONS FOR PROCEDURE:  Ms. Manka is a lovely 70 year old white female  who presented with diffuse symptoms consisting of frequency of urination,  burning, low pelvic pain, and low back pain since August of this year.  She  has had multiple therapies which have really not helped significantly.  CT  scan of the pelvis revealed no significant abnormalities except some dilated  colon.  She has had a colonoscopy, however.  After understanding the risks,  benefits, and alternatives, she elected to proceed with the above procedure  for both diagnostic and therapeutic trial.   DESCRIPTION OF PROCEDURE:  The patient was placed in the supine position and  after proper LMA anesthesia was placed in the dorsal lithotomy position,  prepped and draped with Betadine in a sterile fashion.  Cystourethroscopy  was performed with a 22.5 French Olympus panendoscope utilizing the 12 and  70 degree lenses.  The bladder was carefully inspected and was essentially  normal smooth walled, the urethra was normal and efflux of clear urine was  noted from the normally placed ureteral orifices bilaterally.  Hydraulic  bladder distention was then performed to 80 cm of water. She was filled and  had over 1200 mL  capacity.  This was relieved and there were really no  significant glomerulations or cracks noted.  Biopsies were obtained from the  posterior midline, submitted to pathology and the base was cauterized with  Bovie coagulation cautery.  The bladder was drained, the panendoscope was  removed and the patient was taken to the recovery room stable.                                               Ronald L. Ovidio Hanger, M.D.    RLD/MEDQ  D:  01/15/2003  T:  01/15/2003  Job:  [id number]

## 2010-06-30 NOTE — Letter (Signed)
September 14, 2005     Hailey Corporal, MD  [address]  [place], [place] [address]   RE:  Hailey, [name]  MRN:  [id number]  /  DOB:  [birth year]   Dear Hailey [name]:   I saw Hailey [name] emergently September 14, 2005 with abdominal pain, bloating,  and low-grade fever.  The abdominal exam was unremarkable, and her bloating  was felt to be related to her narcotic pain medicines.   She was hospitalized August 14, 2005-August 20, 2005, having fallen down a flight  of stairs and suffering a mild concussion, three rib fractures, clavicle  fracture, finger fracture, great toe fracture, and tibial fracture.  She had  amnesia for the event.   Additionally, she was hospitalized for 5 days in January 2004 for a pelvic  fracture following a fall.   She was hospitalized in August 2006 for syncope resulting in a left ischial  ramus fracture.  At that time, the attending questioned alcohol abuse.   Her daughter Hailey [name] was accompanying her.  I described the concerns of these  recurrent falls with some amnesia for the event.  Additionally, she has not  pursued immediate attention at the time of these injuries but has waited up  to days later.   When I mentioned the concerns of alcoholism or abuse (her husband died at  [facility] this week), she exhibited almost a la belle indifference.   At the time of her most recent fall out of bed within the past week, a  friend had contacted me to see her in the emergency room.  I had arranged  for films, but Hailey [name] declined to go to the emergency room.  This was despite  having a temperature of over 100 and pleuritic pain.  Her comment was, I  didn't want to go back to [facility].  I practically live there.  She was seen  and placed on Avelox.   She also describes syncope in 1972 while standing in line in [place].  She stated that she had a neurologic evaluation at that time.  Her EEG was  abnormal, and she could not drive for 6 months.   She admits to 2-3 glasses of  wine per day.  She and her husband have had  caregivers in the home, but her nutrition is questionable.   At the time of this exam, she exhibited no splinting.  The fractures on the  chest x-ray made in my office suggested old rib fractures.   She was afebrile at the time of my office visit, but she was asked to  complete the Avelox, as she had had temperatures up to 101.4.   Surprisingly, her urinalysis revealed over 100,000 colonies of Pseudomonas  aeruginosa, for which Cipro will be prescribed and Avelox discontinued.  Her  urinalysis was basically negative, with no bacteria and only trace  leukocytes.   Her lipid profile was almost at goal, with the exception of an HDL of 48 and  an LDL of 103.  These should respond to exercise when she is able as well as  inclusion of cold water fish in the diet and omega-3 fatty acids.  Surprisingly, her SGOT and SGPT were perfectly normal, despite my concerns  as listed above.   Her CBC and differential was normal.   Her sodium was 130, potassium 3.4, chloride 91, and glucose 124.  Her BUN  was 4, indicating no dehydration.   Her hydrochlorothiazide was discontinued, as it is probably  related to all  these metabolic derangements.  Spironolactone 25 mg 1 daily will be  prescribed, along with recheck of a basic metabolic profile and hemoglobin  A1c in 2 weeks.  We will actually have to schedule that appointment, as she  has been noncompliant with followups, returning normally after discharge  instructions from the hospital dictate such followup.  She was somewhat  argumentative about this point, stating that she had been compliant with  medical monitoring.  I have not seen her since November 15, 2004.  She was  placed on Vytorin 10/20 at that time, with request for follow-up labs in 8-9  weeks, which was not completed.   For the bloating, Levsin/SL 0.125 q.4 h. 1-2 was prescribed.  She was  changed to Darvocet-N 100 from the hydrocodone to  improve her constipation.  MiraLax was also prescribed every third day as needed.   I will schedule an event monitor to rule out intermittent heart block as the  etiology of her falls, but I do not believe this is likely.  I will also  schedule a neurologic evaluation following this.   I appreciate yours and Dr. Gaspar Garbe Little's evaluation and recommendations in  the care of this nice but unfortunate woman.   She states that she sees Dr. Tracey Harries, her gynecologist, on a regular  basis.  If the bloating fails to resolve, then it was recommended that she  see him; at that point, I would be concerned about ovarian issues.    Sincerely,      Titus Dubin. Alwyn Ren, MD, [facility]   [facility]/MedQ  DD:  09/19/2005  DT:  09/19/2005  Job #:  [id number]   CC:    Thereasa Solo. Little, MD

## 2010-06-30 NOTE — Procedures (Signed)
REFERRING PHYSICIAN:  Thomos Lemons, D.O. [facility].   CLINICAL INFORMATION:  This 70 year old patient is being evaluated for  syncope.   TECHNICAL DESCRIPTION:  This EEG was recorded during the awake state.  The  background activity shows 10 Hz rhythms with higher amplitudes seen in the  posterior head regions bilaterally. Photic stimulation was performed. This  did not produce a driving response. Hyperventilation testing was not  performed. There is no evidence of any focal asymmetry, stage II sleep, or  epileptiform activity seen.   IMPRESSION:  Is a normal EEG during the awake state.       WUJ:WJXB  D:  09/25/2004 11:38:17  T:  09/25/2004 13:57:00  Job #:  [id number]

## 2010-06-30 NOTE — Discharge Summary (Signed)
Hailey [name], Hailey [name]                 ACCOUNT NO.:  [id number]   MEDICAL RECORD NO.:  [id number]          PATIENT TYPE:  INP   LOCATION:  5715                         FACILITY:  [facility]   PHYSICIAN:  Hailey [name], M.D. LHCDATE OF BIRTH:  [birth year]   DATE OF ADMISSION:  09/22/2004  DATE OF DISCHARGE:  09/30/2004                                 DISCHARGE SUMMARY   DISCHARGE DIAGNOSES:  1.  Status post syncopal event with left ischial ramus fracture.  2.  Questionable history of ethanol abuse.   HISTORY OF PRESENT ILLNESS:  The patient is a 70 year old female status post  fall on September 22, 2004, en route to the bathroom, who experienced a  syncopal episode.  The patient does not recall falling but did wake up and  was found in a pool of blood with facial contusions and left hip pain.  The  patient was admitted for further evaluation.   PAST MEDICAL HISTORY:  1.  Left iliac fracture.  2.  Hypertension.  3.  Osteoporosis.  4.  Hyperlipidemia.  5.  History of syncope.  6.  Obstructive sleep apnea.  7.  GERD.  8.  Status post tubal ligation.  9.  Umbilical hernia repair.   COURSE OF HOSPITALIZATION:  Problem 1.  STATUS POST SYNCOPAL EVENT WITH LEFT HIP PUBIC RAMUS FRACTURE:  The patient was placed on telemetry.  In addition, an orthopedic consult was  obtained.  They did not recommend any surgical intervention, did recommend  pain management and PT/OT.  The patient initially had difficulty working  with physical therapy secondary to pain management.  We have increased her  medications for pain, and patient is currently able to participate in  therapy.  She is not ready to go home from a therapy standpoint and, as a  result, she is most appropriate for a [facility] stay.   Problem 2.  HISTORY OF OSTEOPENIA:  The patient's Fosamax was held during  the admission per pharmacy protocol.  At time of discharge, will need to  resume.   Problem 3.  HISTORY OF HYPERTENSION:  Blood pressure  remained stable.  The  patient was maintained on Maxzide and Cozaar.   Problem 4.  QUESTIONABLE HISTORY OF ETHANOL ABUSE:  The patient was closely  monitored during this stay.  No signs of withdrawal were noted.   MEDICATIONS AT DISCHARGE:  1.  Cozaar 25 mg p.o. b.i.d.  2.  Triamterine/hydrochlorothiazide one tablet p.o. daily.  3.  Protonix 40 mg p.o. daily.  4.  Lexapro 10 mg p.o. daily.  5.  Aspirin 81 mg p.o. daily.  6.  Loratadine 10 mg p.o. daily.  7.  Lovenox 40 mg subcu q.24h.  8.  Lipitor 20 mg p.o. q.h.s.  9.  Fosamax 70 mg each seven days, on hold.  10. Ferrous sulfate 325 mg p.o. daily.  11. Vitamin C 500 mg p.o. daily.  12. Multivitamin with minerals one capsule p.o. daily.  13. Colace 100 mg p.o. b.i.d.  14. Mag-Ox 400 mg p.o. p.r.n.  15. Oxycodone 20 mg p.o. b.i.d.  16. Robaxin 750  mg p.o. b.i.d. and q.6h. p.r.n.  17. Ambien 10 mg p.o. q.h.s. p.r.n.  18. Lorazepam 1 mg p.o. q.h.s. p.r.n.  19. Percocet one to two tablets p.o. q.6h. p.r.n.  20. Hyoscyamine sulfate 0.375 mg p.o. q.6h. p.r.n.  21. Tylenol 650 mg p.o. q.6h. p.r.n.   LABORATORY DATA AT DISCHARGE:  Hemoglobin 11.8, hematocrit 33.8, white blood  cell count 6.5, platelets 215.   FOLLOW-UP:  The patient will be followed by our team while in [facility].  At time  of discharge from [facility], the patient will need outpatient follow-up with  Titus Dubin. Alwyn Ren, M.D., her primary care.      Melissa S. Peggyann Juba, NP      Hailey [name], M.D. [facility]  Electronically Signed    MSO/MEDQ  D:  09/29/2004  T:  09/29/2004  Job:  [id number]   cc:   Titus Dubin. Alwyn Ren, M.D. [facility]  [phone] [place]  [place] [address]

## 2010-06-30 NOTE — Discharge Summary (Signed)
Hailey [name], ZECH                 ACCOUNT NO.:  [id number]   MEDICAL RECORD NO.:  [id number]          PATIENT TYPE:  INP   LOCATION:  5010                         FACILITY:  [facility]   PHYSICIAN:  Cherylynn Ridges, M.D.    DATE OF BIRTH:  01/02/1941   DATE OF ADMISSION:  08/14/2005  DATE OF DISCHARGE:  08/20/2005                                 DISCHARGE SUMMARY   CONSULTANTS:  Dr. Rennis Chris, orthopedic surgery.   PRIMARY CARE [place] Fel[place]:  Dr. Alwyn Ren.   DISCHARGE DIAGNOSES:  1.  Status post fall down a flight of stairs.  2.  Mild concussion, amnesia for the event.  3.  Right rib fractures x3.  4.  Right clavicle fracture.  5.  Left hand third distal phalanx fracture.  6.  Left great toe fracture.  7.  Left tiny nondisplaced fracture, medial tibial plateau, not involving      the weightbearing surface.  8.  Mild acute on chronic anemia.  9.  Pseudomonas urinary tract infection.  Patient completed treatment while      hospitalized.  10. Hypertension.  11. Gastroesophageal reflux disease.  12. Depression.  13. Arthritis.  14. Anxiety.  15. Elevated lipids.  16. Previous lumbar and cervical spine fusions.   BRIEF HISTORY ON ADMISSION:  This is a 70 year old white female who  apparently fell down a flight of stairs the day prior to presentation.  She  was amnesic for the event.  She was reportedly seen by EMS and apparently  desired to attempt to stay home and was not transported to the ED.  However,  she was unable to get out of bed the following day on August 14, 2005 and was  brought to the emergency room by private vehicle.  She was found to have  multiple injuries including right rib fractures x3, left finger fracture,  right distal third minimally displaced clavicle fracture, left tiny medial  tibial plateau fracture, left foot great toe tuft fracture.  Head CT scan  was without acute intracranial abnormalities.  She did have some mild  atrophy and white matter degenerative  changes.   She was admitted for observation, pain control and mobilization.  She was  seen in consultation per Dr. Rennis Chris for orthopedic surgery and treated  conservatively, weightbearing as tolerated bilateral lower extremities, and  it was recommended that she wear a sling to the right upper extremity with  no weightbearing but was okayed for light use of her right upper extremity.  She mobilized slowly due to her multiple injuries but has improved  sufficiently though she was able to be discharged home.   MEDICATIONS ON DISCHARGE:  Include Percocet 5/325 one to two p.o. q.4-6h.  p.r.n. pain #50, no refill.   She has an extensive list of home medications which include:  1.  Cozaar 50 mg p.o. q.d.  2.  Hydrochlorothiazide 25 mg p.o. q.d.  3.  Celebrex 200 mg p.o. b.i.d.  4.  Lexapro 20 mg p.o. q.d.  5.  Fexofenadine 60 mg p.o. q.d.  6.  Aciphex 20 mg p.o. q.d.  7.  Baby aspirin 81 mg p.o. q.d.  8.  Multivitamin 1 p.o. q.d.  9.  Calcium with vitamin D daily.  10. Ferrous sulfate daily.  11. Ativan 1 mg p.o. t.i.d. p.r.n. anxiety.  12. Verapamil 180 mg p.o. q.d.  13. Lipitor 20 mg p.o. q.d.  14. Ambien CR 12.5 mg p.o. q.h.s. p.r.n. sleep.  15. Fosamax every week.  16. Glucosamine 1500 mg daily.  17. Chondroitin 1200 mg daily.  18. Potassium gluconate 500 mg daily.  19. Flexeril 10 mg p.o. t.i.d. p.r.n.   These medications were not written for, but she did have a prescription  written for Percocet as noted above.   At this time, the patient is prepared for discharge.   She should see Dr. Alwyn Ren in 2-3 weeks for follow up of anemia.  She did  have a 15,000 colony Pseudomonas urinary tract infection which was treated  with 5 days of Cipro and will need a follow-up reassessment following  discharge.   She is to follow up with Dr. Rennis Chris in approximately 2 weeks.  She is to  call for this appointment.  She will follow up with trauma services as  needed.  She will also have  home health PT, OT in follow up and social work  consult in follow-up.      Shawn Rayburn, P.A.      Cherylynn Ridges, M.D.  Electronically Signed    SR/MEDQ  D:  08/20/2005  T:  08/20/2005  Job:  [id number]   cc:   Titus Dubin. Alwyn Ren, M.D. [facility]  [phone] [place]  [place] [address]   Vania Rea. Supple, M.D.  Fax: [phone]

## 2010-06-30 NOTE — H&P (Signed)
NAME:  Hailey [name], Hailey [name]                 ACCOUNT NO.:  [id number]   MEDICAL RECORD NO.:  [id number]          PATIENT TYPE:  INP   LOCATION:                               FACILITY:  [facility]   PHYSICIAN:  Rene Paci, M.D. LHCDATE OF BIRTH:  Jun 20, 1940   DATE OF ADMISSION:  09/30/2004  DATE OF DISCHARGE:                                HISTORY & PHYSICAL   CHIEF COMPLAINT:  Left hip pain.   HISTORY OF PRESENT ILLNESS:  The patient is a 70 year old white female,  status post fall with a syncopal event trip to the bathroom on September 22, 2004.  The patient was admitted for further evaluation and was seen by  orthopedics and it was determined that no surgical intervention was  necessary, and that the patient needs continued therapy, prior to discharge  home.   PAST MEDICAL/SURGICAL HISTORY:  1.  Left iliac fracture.  2.  Hypertension.  3.  Osteopenia.  4.  Hyperlipidemia.  5.  Syncope.  6.  OSA.  7.  Gastroesophageal reflux disease.  8.  Status post tubal ligation.  9.  Status post umbilical hernia repair.   FAMILY HISTORY:  The patient's father has a history of heart disease.   SOCIAL HISTORY:  The patient has a remote history of tobacco use x35 years.  The patient reports two glasses of wine each evening.  She is married.   MEDICATIONS:  1.  Cozaar 25 mg p.o. b.i.d.  2.  Triamterene/hydrochlorothiazide 37.5/25 mg p.o. daily.  3.  Protonix 40 mg p.o. daily.  4.  Lexapro 10 mg p.o. daily.  5.  Aspirin 81 mg p.o. daily.  6.  Claritin 10 mg p.o. daily.  7.  Lovenox 40 mg p.o. q.24h.  8.  Lipitor 20 mg p.o. q.h.s.  9.  Fosamax 70 mg p.o. q.7 days.  10. Ferrous sulfate 325 mg p.o. daily.  11. Vitamin C 500 mg p.o. daily.  12. Multivitamin with minerals, one cap p.o. daily.  13. Colace 100 mg p.o. b.i.d.  14. Oxycodone 20 mg p.o. b.i.d.  15. Robaxin 750 mg p.o. b.i.d. and q.6h. p.r.n.  16. Ambien 10 mg p.o. q.h.s. and p.r.n.  17. Ativan 1 mg p.o. q.h.s. and p.r.n.  18.  Percocet one to two tab p.o. q.6h. p.r.n.  19. Hyoscyamine sulfate 0.375 mg p.o. q.6h. p.r.n.  20. Milk of magnesia 30 mL p.o. q.6h. p.r.n.   PHYSICAL EXAMINATION:  VITAL SIGNS:  Blood pressure 152/88, heart rate 71,  respirations 18, temperature 97.5 degrees.  GENERAL:  The patient is a 70 year old female patient who appears younger  than her stated age, lying in bed in no acute distress.  Positive bruising  noted over the left orbit and left lip.  CARDIOVASCULAR:  S1, S2, a regular rate and rhythm.  LUNGS:  Clear to auscultation bilaterally.  No wheezes, rales or rhonchi.  ABDOMEN:  Soft, nontender, non-distended.  EXTREMITIES:  No peripheral edema.  NEUROLOGIC:  A and O x3.   ASSESSMENT:  1.  Status post syncopal episode.  2.  Left ischial ramus fracture.  PLAN:  To transfer the patient to transfer the patient to the [facility] in the  morning, when a bed becomes available.  The patient needs continued pain  management  and monitoring of the pain status.  Plan to continue current  medications.      Melissa S. Peggyann Juba, NP      Rene Paci, M.D. [facility]  Electronically Signed    MSO/MEDQ  D:  09/29/2004  T:  09/29/2004  Job:  [id number]

## 2010-06-30 NOTE — Discharge Summary (Signed)
NAME:  Hailey [name], Hailey [name]                           ACCOUNT NO.:  [id number]   MEDICAL RECORD NO.:  [id number]                   PATIENT TYPE:  INP   LOCATION:  5013                                 FACILITY:  [facility]   PHYSICIAN:  Mila Homer. Sherlean Foot, M.D.              DATE OF BIRTH:  1940/08/24   DATE OF ADMISSION:  02/12/2002  DATE OF DISCHARGE:  02/16/2002                                 DISCHARGE SUMMARY   ADMISSION DIAGNOSES:  1. Left iliac wing fracture.  2. Hypertension.  3. Osteoporosis.  4. Hypercholesterolemia.   DISCHARGE DIAGNOSES:  1. Left iliac crest/wing fracture.  2. Hypertension.  3. Osteoporosis.  4. Hypercholesterolemia.   PROCEDURE:  None.   CONSULTATIONS:  1. Physical therapy consult on 02/13/02.  2. Occupational therapy consult 02/14/02.  3. Case management consult and rehabilitation medication consult 02/26/02.   HISTORY OF PRESENT ILLNESS:  This 70 year old white female patient was in  [place] on vacation when she fell down a couple of stairs landing on her  left hip.  She thinks she fell about three steps.  She had no loss of  consciousness.  She hit her head, her right elbow and her left hip.  She was  evaluated in [place] and was found to have a left iliac crest/iliac wing  fracture, and she was transferred here for treatment.   HOSPITAL COURSE:  She was admitted on 1/1 and placed on pain medications.  CT scan taken of the left hip showed a fracture just in the iliac wing  without involvement of the hip joint.  She was started on physical therapy  and given medications for pain control.   She made slow progress with physical therapy over the next several days.  Her vitals remained stable and her legs remained neurovascularly intact.  She would have little help at home, so she needed to be a bit more  independent before transferred home.  On 1/5, it was felt she was ready for  discharge home and she was discharged home on that day.   DISCHARGE  INSTRUCTIONS:  Diet:  She can resume her regular  prehospitalization diet.   MEDICATIONS:  She can resume her prehospitalization medications which  include:  1. Aspirin every other day.  2. Lipitor one a day.  3. Celebrex 200 b.i.d.  4. Fosamax 70 mg once a week.  5. Hydrochlorothiazide daily.  6. Prevacid 15 mg b.i.d.  7. Cozaar b.i.d.  8. Trazodone one at bedtime.  9. Allegra 60 mg b.i.d.  10.      Ambien 10 mg q.h.s.  11.      Lorazepam as needed.  12.      Calcium and vitamin D and multivitamin once a day.  Additional medications include:  1. OxyContin 10 mg one tablet p.o. q.12h. #20 with no refill.  2. Baby aspirin 81 mg once a day for every day for a  month.  3. Vicodin 1-2 p.o. q.4h. p.r.n. pain #50 with no refill.   ACTIVITY:  She is to be out of bed, weight bearing as tolerated on the left  with use of the walker.  She is to arrange for home health physical therapy  per [facility].   WOUND CARE:  Nonapplicable.   FOLLOWUP:  She needs to follow up with Dr. Sherlean Foot in our office in  approximately two weeks and needs to call [phone] for that appointment.   LABORATORY DATA:  None available in the chart at this time.     Legrand Pitts Duffy, P.A.                      Mila Homer. Sherlean Foot, M.D.    KED/MEDQ  D:  03/30/2002  T:  03/30/2002  Job:  [id number]

## 2010-06-30 NOTE — Op Note (Signed)
NAME:  Hailey [name], Hailey [name]                 ACCOUNT NO.:  [id number]   MEDICAL RECORD NO.:  [id number]          PATIENT TYPE:  INP   LOCATION:  3731                         FACILITY:  [facility]   PHYSICIAN:  John L. Rendall, M.D.  DATE OF BIRTH:  1940-08-08   DATE OF PROCEDURE:  09/23/2004  DATE OF DISCHARGE:                                 OPERATIVE REPORT   CHIEF COMPLAINT:  Left hip pain.   HISTORY OF PRESENT ILLNESS:  This 70 year old white female fell on the way  to the bathroom as a result of a syncopal episode on September 22, 2004.  She  additionally has a black eye, left orbital contusion, history of osteopenia  and history of previous pelvic fractures when she fell down stairs in [place] 3-4 years ago.  The patient information sheet is reviewed including the  clinical records.  No other significant relative data other than she does  take Cozaar, hydrochlorothiazide, Celebrex, Aciphex, and she has a history  of taking Fosamax lately.   PHYSICAL EXAMINATION:  GENERAL:  Examination reveals a slender 70 year old  female lying supine in bed.  She keeps saying Don't hurt me.  The left leg  will tolerate gentle internal and external rotation of the leg lying there  in extension allowing 20 degrees internal and external rotation of the hip  that the pelvis.  She is not tender to compression of the iliac crest.  She  is not tender to pressure over the anterior superior pubic ramus or  symphysis area.  She is, however, tender over the ischial tuberosity  posteriorly.  The knee, foot and ankle otherwise appeared unremarkable and  the right leg has no pain with motion.  Gentle compression of the heel  tapping causes no hip pain.  X-rays were reviewed on [place] image cast,  and it shows what appears to be old healed pelvic fractures of the ileum and  superior pubic ramus.  I do believe there is an acute inferior ischial ramus  fracture on the left that shows no reaction of healing at this  point.   IMPRESSION:  1.  Clinically and radiographically acute left ischial ramus fracture.  2.  Old healed fractures of the ileum and pubic ramus.   DISPOSITION:  Will plan adding Robaxin as a muscle relaxant.  It is okay  remove any more packs and anastomosis relaxant,  It is okay to mobilize her  out of bed.  Weightbearing as tolerated once spasm is under control.  She  should recheck in our office in 2-3 weeks.  She will need a walker plus or  minus a wheelchair if she cannot tolerate walking any distance.       JLR/MEDQ  D:  09/23/2004  T:  09/24/2004  Job:  [id number]

## 2010-06-30 NOTE — H&P (Signed)
NAME:  Hailey [name], Hailey [name]                 ACCOUNT NO.:  [id number]   MEDICAL RECORD NO.:  [id number]          PATIENT TYPE:  EMS   LOCATION:  MAJO                         FACILITY:  [facility]   PHYSICIAN:  Sharlet Salina T. Hailey [name], M.D.DATE OF BIRTH:  1940-06-03   DATE OF ADMISSION:  08/14/2005  DATE OF DISCHARGE:                                HISTORY & PHYSICAL   CHIEF COMPLAINT:  Fall, right shoulder, right chest, left knee pain.   PRESENT ILLNESS:  [name] is a 70 year old female who apparently fell down  a flight of stairs yesterday.  She has no recollection of the accident.  The  first thing she remembers is waking up in her bed at home.  She states that  apparently her husband called EMS, and she was evaluated at the scene and  then taken upstairs to her bed.  When she awoke this morning, she was having  quite a bit of pain in her right shoulder, and her right chest, as well as  her left knee to where she was really unable to get up and move about and  she was brought by private vehicle to the emergency room for evaluation.  X-  rays have revealed a number of injuries as described below.   PAST MEDICAL HISTORY:  Surgery includes:  1.  Cervical spine fusion.  2.  Lumbar fusion.  3.  Tonsillectomy.  Medically, she is followed for:  1.  Depression.  2.  Hypertension.  3.  Arthritis.  4.  GERD.   MEDICATIONS:  1.  Fexofenadine 60 mg daily.  2.  Cozaar 50 mg daily.  3.  Hydrochlorothiazide 25 mg daily.  4.  Celebrex 200 mg b.i.d.  5.  Aciphex 20 mg daily.  6.  Lexapro 20 mg daily.  7.  Aspirin 81 mg daily.  8.  On a p.r.n. basis she uses hyoscyamine, doxycycline, Ultram, Phenergan,      Vicodin, and Darvocet.   She is allergic to __________  .   SOCIAL HISTORY:  She is married.  Her husband is a hospice patient with  cancer.  She does not smoke cigarettes.  Drinks occasional glass of wine.   FAMILY HISTORY:  Noncontributory.   REVIEW OF SYSTEMS:  GENERAL:  No fever, chills,  weight change.  RESPIRATORY:  Denies shortness of breath, cough, wheezing.  CARDIAC:  Denies chest pain,  palpitations, history of heart disease.  ABDOMEN/GI:  No abdominal pain,  nausea, vomiting, change in bowel habits.  MUSCULOSKELETAL:  She has chronic  joint and back pain from arthritis.  PSYCHIATRIC:  Positive for depression.   PHYSICAL EXAMINATION:  VITAL SIGNS:  Temperature is 97.8, pulse 94,  respirations 18, blood pressure 155/83, o\Oxygen saturation is 98%.  GENERAL:  Alert, well-developed female in no acute stress.  SKIN:  Somewhat diaphoretic.  She is currently nauseated.  HEENT:  A cervical collar in place.  This is removed.  Trachea is midline.  There is no neck tenderness.  She has full active range of motion of the  neck without pain.  Pupils equal, round, reactive to light.  No facial or  cranial swelling or tenderness.  Oropharynx clear.  LUNGS:  Clear to auscultation bilaterally.  There is some tenderness along  the right side of the chest without crepitance.  CARDIOVASCULAR:  Normal S1-S2.  No murmur.  No edema.  Peripheral pulses  palpable.  ABDOMEN:  Soft, nontender.  No masses.  No organomegaly.  Nondistended.  PELVIS:  Stable, nontender.  MUSCULOSKELETAL:  There is extensive bruising and slight swelling over the  right shoulder with pain with any motion of the shoulder.  Her left third  finger is splinted and there is some tenderness of the distal IP joint.  Left great toe is buddy taped.  There is some slight bruising and tenderness  of the left great toe.  Her left knee is in an immobilizer which is not  removed at this time.  NEUROLOGIC:  She is alert and oriented to person, place, and situation.  She  does have amnesia for the event.  Motor and sensory exams are grossly normal  in the extremities.   LABORATORY:  Electrolytes abnormal for a sodium of 124, chloride of 92,  hemoglobin is 10.9.  A urinalysis negative.   IMAGING:  Chest x-ray shows fractured -  minimally displaced - ribs x3 on the  right without hemopneumothorax.  C spine shows status post fusion.  No  apparent acute injury.  Extremity x-rays include left hand showing a  fracture of the base of the distal third phalanx, left foot showing a tuft  fracture of the left great toe, left knee showing a question tiny occult  tibial plateau fracture but no associated swelling.  The right shoulder  shows a distal right clavicle fracture.   ASSESSMENT/PLAN:  A 70 year old female with a fall injuries including:  1.  Closed head injury, amnesia, CT scan of the head normal.  2.  Multiple right rib fractures without hemothorax or pneumothorax.  3.  Fractured distal clavicle on the right.  4.  Fractured third distal phalanx left hand.  5.  Tuft fracture, left great toe.  6.  Questionable tibial plateau fracture, left knee.  7.  Hyponatremia.   PLAN:  1.  The patient is being admitted to the trauma service for pain control.  2.  Dr. Rennis Chris has reviewed her films and recommended a shoulder sling, left      knee immobilizer, buddy taping of      the left toe, splinting of the left third finger and outpatient follow-      up.  3.  We will repeat a chest x-ray and laboratory in the morning.  4.  Mobilize as tolerated.      Lorne Skeens. Hailey [name], M.D.  Electronically Signed     BTH/MEDQ  D:  08/14/2005  T:  08/14/2005  Job:  [id number]

## 2010-06-30 NOTE — Discharge Summary (Signed)
Hailey [name], Hailey [name]                 ACCOUNT NO.:  [id number]   MEDICAL RECORD NO.:  [id number]          PATIENT TYPE:  ORB   LOCATION:  4522                         FACILITY:  [facility]   PHYSICIAN:  Rene Paci, M.D. LHCDATE OF BIRTH:  Jul 09, 1940   DATE OF ADMISSION:  09/30/2004  DATE OF DISCHARGE:  10/06/2004                                 DISCHARGE SUMMARY   DISCHARGE DIAGNOSES:  1.  Status post syncopal event with left pelvic fracture.  2.  Debilitation secondary to #1.   HISTORY OF PRESENT ILLNESS:  Patient is a 70 year old female who had a  syncopal event with fracture of the left ischial ramus, who was hospitalized  and was transferred to [facility] for further rehabilitation prior to discharge to  home.   PAST MEDICAL HISTORY:  1.  Left iliac fracture.  2.  Hypertension.  3.  Osteoporosis.  4.  Hyperlipidemia.  5.  History of syncope.  6.  Obstructive sleep apnea.  7.  GERD.  8.  Status post tubal ligation.  9.  Umbilical hernia repair.   HOSPITAL COURSE:  STATUS POST SYNCOPAL EVENT WITH LEFT HIP PUBIC RAMUS  FRACTURE:  Patient was transferred to the [facility] where she underwent  additional rehab.  Patient continued to progress and was ultimately  discharged to home with home health.   DISCHARGE LABORATORY DATA:  There were no new labs at time of discharge.   DISCHARGE MEDICATIONS:  1.  Allegra 60 mg p.o. daily.  2.  Cozaar 50 mg p.o. daily.  3.  Hydrochlorothiazide 25 mg p.o. daily.  4.  Lexapro 10 mg p.o. daily.  5.  Multivitamin 600 mg p.o. daily.  6.  Vitamin C 500 mg p.o. daily.  7.  Iron 77 mg p.o. daily.  8.  Glucosamine 150 mg p.o. daily.  9.  Lipitor 20 mg p.o. daily.  10. Lorazepam 1 mg p.o. daily.  11. OxyContin 20 mg p.o. q.a.m. and q.p.m.  12. Robaxin 750 one tablet p.o. b.i.d.  13. Percocet 5/325 mg one to two tablets q.6h. p.r.n. pain.  14. Senokot over-the-counter one tablet p.o. b.i.d.   FOLLOW UP:  Patient was instructed to call Dr. Alwyn Ren for a  follow-up  appointment in about two weeks or as needed.      Melissa S. Peggyann Juba, NP      Rene Paci, M.D. [facility]  Electronically Signed    MSO/MEDQ  D:  12/14/2004  T:  12/15/2004  Job:  [id number]   cc:   Thomos Lemons, D.O. [facility]  [address] [place], [place] [address]   Titus Dubin. Alwyn Ren, M.D. [facility]  [phone] [place]  [place] [address]

## 2010-08-07 ENCOUNTER — Other Ambulatory Visit: Payer: Self-pay | Admitting: Internal Medicine

## 2010-08-15 ENCOUNTER — Ambulatory Visit: Payer: Medicare Other | Admitting: Internal Medicine

## 2010-08-19 ENCOUNTER — Encounter: Payer: Self-pay | Admitting: Internal Medicine

## 2010-08-22 ENCOUNTER — Ambulatory Visit (INDEPENDENT_AMBULATORY_CARE_PROVIDER_SITE_OTHER): Payer: Medicare Other | Admitting: Internal Medicine

## 2010-08-22 ENCOUNTER — Encounter: Payer: Self-pay | Admitting: Internal Medicine

## 2010-08-22 VITALS — BP 110/68 | HR 64 | Temp 98.2°F

## 2010-08-22 DIAGNOSIS — Z4802 Encounter for removal of sutures: Secondary | ICD-10-CM

## 2010-08-22 DIAGNOSIS — M549 Dorsalgia, unspecified: Secondary | ICD-10-CM

## 2010-08-22 NOTE — Progress Notes (Signed)
  Subjective:    Patient ID: Flossie Buffy, female    DOB: May 26, 1940, 70 y.o.   MRN: [id number]  HPI Mrs. Hussein  is here for suture removal. She had undergone extensive neurosurgical procedure at [facility] by  Dr. Donette Larry. His  Instructed her that the  38 metal sutures should removed by this date.  Since surgery she's had severe ongoing back pain. Dr. Wyline Mood has  prescribed oxycodone 10 mg every 6 hours; she's been taking this on average  3 per day. She's also been on a muscle relaxant  twice a day.  She is scheduled to see her pain management physician Dr. Thyra Breed on July 16 .      Review of Systems since the surgery she denies any fever, chills, sweats, or purulence from the wound site     Objective:   Physical Exam she is in no acute distress but appears uncomfortable related to the pain in her back.  She is decreased range of motion of her back. She must  slowly turn to the lateral decubitus position so the wound can be observed.  The skin reveals no erythema;  increased temperature change; or purulence.          Assessment & Plan:  #1 suture removal; 38 metal sutures removed without difficulty  #2 chronic pain syndrome, as per Dr. Vear Clock.

## 2010-08-22 NOTE — Patient Instructions (Signed)
Please report fever, chills, sweats, or any purulent secretions at the site of the wound as discussed

## 2010-08-24 ENCOUNTER — Ambulatory Visit (INDEPENDENT_AMBULATORY_CARE_PROVIDER_SITE_OTHER): Payer: Medicare Other

## 2010-08-24 ENCOUNTER — Ambulatory Visit: Payer: Medicare Other | Admitting: Internal Medicine

## 2010-08-24 DIAGNOSIS — J309 Allergic rhinitis, unspecified: Secondary | ICD-10-CM

## 2010-09-01 ENCOUNTER — Ambulatory Visit (INDEPENDENT_AMBULATORY_CARE_PROVIDER_SITE_OTHER): Payer: Medicare Other | Admitting: Internal Medicine

## 2010-09-01 ENCOUNTER — Encounter: Payer: Self-pay | Admitting: Internal Medicine

## 2010-09-01 ENCOUNTER — Other Ambulatory Visit (INDEPENDENT_AMBULATORY_CARE_PROVIDER_SITE_OTHER): Payer: Medicare Other

## 2010-09-01 VITALS — BP 120/74 | HR 105 | Ht 68.25 in | Wt 149.6 lb

## 2010-09-01 DIAGNOSIS — M549 Dorsalgia, unspecified: Secondary | ICD-10-CM

## 2010-09-01 DIAGNOSIS — G4700 Insomnia, unspecified: Secondary | ICD-10-CM

## 2010-09-01 DIAGNOSIS — K219 Gastro-esophageal reflux disease without esophagitis: Secondary | ICD-10-CM

## 2010-09-01 DIAGNOSIS — J309 Allergic rhinitis, unspecified: Secondary | ICD-10-CM

## 2010-09-01 LAB — CBC WITH DIFFERENTIAL/PLATELET
Basophils Absolute: 0.1 10*3/uL (ref 0.0–0.1)
Basophils Relative: 0.6 % (ref 0.0–3.0)
Eosinophils Absolute: 0.3 10*3/uL (ref 0.0–0.7)
Lymphocytes Relative: 12.2 % (ref 12.0–46.0)
MCHC: 33.6 g/dL (ref 30.0–36.0)
MCV: 90.7 fl (ref 78.0–100.0)
Monocytes Absolute: 0.4 10*3/uL (ref 0.1–1.0)
Neutro Abs: 10.1 10*3/uL — ABNORMAL HIGH (ref 1.4–7.7)
Neutrophils Relative %: 81.5 % — ABNORMAL HIGH (ref 43.0–77.0)
RBC: 3.78 Mil/uL — ABNORMAL LOW (ref 3.87–5.11)
RDW: 14.8 % — ABNORMAL HIGH (ref 11.5–14.6)

## 2010-09-01 NOTE — Patient Instructions (Signed)
Stay off ambien as discussed  Continue allergy vaccine  Lab- CBC- dx back pain

## 2010-09-01 NOTE — Assessment & Plan Note (Signed)
She is satisfied and will continue allergy vaccine

## 2010-09-01 NOTE — Assessment & Plan Note (Addendum)
Chronic nonspecific insomnia, agravated by pain. We will leave her on lorazepam alone to avoid mixing meds. Sleep hygiene counseling has been done.

## 2010-09-01 NOTE — Progress Notes (Signed)
Subjective:    Patient ID: Flossie Buffy, female    DOB: 1940/08/08, 70 y.o.   MRN: [id number]  HPI 09/01/10-  47 yoF former smoker followed for allergic rhinitis, insomnia, complicated by DGD/ surgeries/ chronic pain, GERD    Here with aide carlene Last here 08/26/08 - note reviewed She wandered in sleep so she stopped ambien, but continues lorazepam . Never sleeps well. . She is also taking pain meds and agrees we should not add more sedating meds. . Just had extensive back surgery in January. Denies cough or dysuria. Was treated for UTI.  Today poor appetite and feels hot and cold. No fever, cough or dysuria and reports incision looks clean. She feels she is doing well with allergy vaccine. She has cat and we added cat to her serum and she feels that has worked.    Review of Systems Constitutional:   No-   weight loss,  No distinct night sweats, fevers, chills, fatigue, lassitude. HEENT:   No-   headaches, difficulty swallowing, tooth/dental problems, sore throat,                  No-   sneezing, itching, ear ache, nasal congestion, post nasal drip,   CV:  No-   chest pain, orthopnea, PND, swelling in lower extremities, anasarca, dizziness, palpitations  GI:  No-   heartburn, indigestion, abdominal pain, nausea, vomiting, diarrhea,                 change in bowel habits, loss of appetite  Resp: No-   shortness of breath with exertion or at rest.  No-  excess mucus,             No-   productive cough,  No non-productive cough,  No-  coughing up of blood.              No-   change in color of mucus.  No- wheezing.    Skin: No-   rash or lesions.  GU: No-   dysuria, change in color of urine, no urgency or frequency.  No- flank pain.  MS:    Psych:  No- change in mood or affect. No depression or anxiety.  No memory loss.      Objective:   Physical Exam General- Alert, Oriented, Affect-appropriate, Distress- none acute   Thin  Skin- rash-none, lesions- none, excoriation-  none Lymphadenopathy- none Head- atraumatic            Eyes- Gross vision intact, PERRLA, conjunctivae clear secretions     Conjunctivae look pale (on iron)            Ears- Hearing, canals            Nose- Clear, N0- Septal dev, mucus, polyps, erosion, perforation             Throat- Mallampati II , mucosa clear , drainage- none, tonsils- atrophic Neck- flexible , trachea midline, no stridor , thyroid nl, carotid no bruit Chest - symmetrical excursion , unlabored           Heart/CV- RRR , no murmur , no gallop  , no rub, nl s1 s2                           - JVD- none , edema- none, stasis changes- none, varices- none           Lung- Question fine crackles right base, wheeze- none,  cough- none , dullness-none, rub- none           Chest wall-  Abd- tender-no, distended-no, bowel sounds-present, HSM- no Br/ Gen/ Rectal- Not done, not indicated Extrem- cyanosis- none, clubbing, none, atrophy- none, strength- nl Neuro- grossly intact to observation           Assessment & Plan:

## 2010-09-03 NOTE — Assessment & Plan Note (Signed)
She questions low grade infection somewhere. We agreed to check CBC in advance of upcoming return to her surgeon. Result to be sent to her.

## 2010-09-03 NOTE — Assessment & Plan Note (Signed)
Reflux precautions reinforced- special risk with pain meds after back surgery.

## 2010-09-25 NOTE — Progress Notes (Signed)
Quick Note:  Pt aware of results-mailed copy to patient and faxed to Dr Donette Larry ______

## 2010-11-22 ENCOUNTER — Other Ambulatory Visit: Payer: Self-pay | Admitting: Internal Medicine

## 2010-11-22 NOTE — Telephone Encounter (Signed)
Ok to refill x 5 mos

## 2010-11-22 NOTE — Telephone Encounter (Signed)
Please advise if okay to refill. Thanks.  

## 2010-11-22 NOTE — Telephone Encounter (Signed)
Ok to refill 

## 2010-11-28 LAB — PROTIME-INR
INR: 0.9
Prothrombin Time: 12.4

## 2010-11-28 LAB — CBC
HCT: 29.2 — ABNORMAL LOW
Hemoglobin: 10.1 — ABNORMAL LOW
MCHC: 35.3
MCV: 90.1
MCV: 90.8
Platelets: 185
Platelets: 226
RBC: 3.22 — ABNORMAL LOW
RBC: 3.38 — ABNORMAL LOW
RDW: 14.3 — ABNORMAL HIGH
WBC: 5.1
WBC: 5.3

## 2010-11-28 LAB — BASIC METABOLIC PANEL
BUN: 2 — ABNORMAL LOW
BUN: 7
CO2: 26
Calcium: 8.7
Calcium: 9
Chloride: 91 — ABNORMAL LOW
Chloride: 99
Creatinine, Ser: 0.53
Creatinine, Ser: 0.62
GFR calc Af Amer: >60
GFR calc Af Amer: >60
GFR calc non Af Amer: >60
GFR calc non Af Amer: >60
Glucose, Bld: 90
Potassium: 4.1
Sodium: 133 — ABNORMAL LOW

## 2010-11-28 LAB — URINALYSIS, ROUTINE W REFLEX MICROSCOPIC
Bilirubin Urine: NEGATIVE
Hgb urine dipstick: NEGATIVE
Nitrite: NEGATIVE
Protein, ur: NEGATIVE
Specific Gravity, Urine: 1.014
Urobilinogen, UA: 0.2

## 2010-12-06 ENCOUNTER — Other Ambulatory Visit: Payer: Self-pay | Admitting: Internal Medicine

## 2011-01-24 ENCOUNTER — Other Ambulatory Visit: Payer: Self-pay | Admitting: Internal Medicine

## 2011-01-25 ENCOUNTER — Ambulatory Visit (INDEPENDENT_AMBULATORY_CARE_PROVIDER_SITE_OTHER): Payer: Medicare Other

## 2011-01-25 DIAGNOSIS — J309 Allergic rhinitis, unspecified: Secondary | ICD-10-CM

## 2011-02-18 ENCOUNTER — Other Ambulatory Visit: Payer: Self-pay | Admitting: Internal Medicine

## 2011-02-26 ENCOUNTER — Other Ambulatory Visit: Payer: Self-pay | Admitting: Internal Medicine

## 2011-02-26 DIAGNOSIS — M961 Postlaminectomy syndrome, not elsewhere classified: Secondary | ICD-10-CM | POA: Diagnosis not present

## 2011-02-26 DIAGNOSIS — M159 Polyosteoarthritis, unspecified: Secondary | ICD-10-CM | POA: Diagnosis not present

## 2011-02-26 DIAGNOSIS — M47817 Spondylosis without myelopathy or radiculopathy, lumbosacral region: Secondary | ICD-10-CM | POA: Diagnosis not present

## 2011-02-27 NOTE — Telephone Encounter (Signed)
Lipid/Hep 272.4/995.20  

## 2011-03-04 ENCOUNTER — Other Ambulatory Visit: Payer: Self-pay | Admitting: Internal Medicine

## 2011-03-12 ENCOUNTER — Other Ambulatory Visit: Payer: Self-pay | Admitting: Internal Medicine

## 2011-03-13 MED ORDER — VERAPAMIL HCL 180 MG PO CP24
180.0000 mg | ORAL_CAPSULE | Freq: Every day | ORAL | Status: DC
Start: 2011-03-13 — End: 2013-11-17

## 2011-03-13 NOTE — Telephone Encounter (Signed)
Rx sent 

## 2011-03-26 DIAGNOSIS — M4310 Spondylolisthesis, site unspecified: Secondary | ICD-10-CM | POA: Diagnosis not present

## 2011-03-26 DIAGNOSIS — M545 Low back pain, unspecified: Secondary | ICD-10-CM | POA: Diagnosis not present

## 2011-03-26 DIAGNOSIS — M47817 Spondylosis without myelopathy or radiculopathy, lumbosacral region: Secondary | ICD-10-CM | POA: Diagnosis not present

## 2011-04-02 ENCOUNTER — Other Ambulatory Visit: Payer: Self-pay | Admitting: Internal Medicine

## 2011-04-02 MED ORDER — RABEPRAZOLE SODIUM 20 MG PO TBEC: 20.0000 mg | DELAYED_RELEASE_TABLET | Freq: Every day | ORAL | Status: DC

## 2011-04-02 MED ORDER — ESCITALOPRAM OXALATE 20 MG PO TABS: 20.0000 mg | ORAL_TABLET | Freq: Every day | ORAL | Status: DC

## 2011-04-02 MED ORDER — CELECOXIB 200 MG PO CAPS: ORAL_CAPSULE | ORAL | Status: DC

## 2011-04-02 NOTE — Telephone Encounter (Signed)
RX sent

## 2011-04-05 DIAGNOSIS — R5381 Other malaise: Secondary | ICD-10-CM | POA: Diagnosis not present

## 2011-04-05 DIAGNOSIS — M810 Age-related osteoporosis without current pathological fracture: Secondary | ICD-10-CM | POA: Diagnosis not present

## 2011-04-05 DIAGNOSIS — E559 Vitamin D deficiency, unspecified: Secondary | ICD-10-CM | POA: Diagnosis not present

## 2011-04-13 DIAGNOSIS — M810 Age-related osteoporosis without current pathological fracture: Secondary | ICD-10-CM | POA: Diagnosis not present

## 2011-04-27 DIAGNOSIS — S22009A Unspecified fracture of unspecified thoracic vertebra, initial encounter for closed fracture: Secondary | ICD-10-CM | POA: Diagnosis not present

## 2011-04-27 DIAGNOSIS — Z981 Arthrodesis status: Secondary | ICD-10-CM | POA: Diagnosis not present

## 2011-04-27 DIAGNOSIS — M545 Low back pain, unspecified: Secondary | ICD-10-CM | POA: Diagnosis not present

## 2011-04-27 DIAGNOSIS — Z96649 Presence of unspecified artificial hip joint: Secondary | ICD-10-CM | POA: Diagnosis not present

## 2011-04-27 DIAGNOSIS — M4000 Postural kyphosis, site unspecified: Secondary | ICD-10-CM | POA: Diagnosis not present

## 2011-04-27 DIAGNOSIS — I722 Aneurysm of renal artery: Secondary | ICD-10-CM | POA: Diagnosis not present

## 2011-05-09 DIAGNOSIS — E7800 Pure hypercholesterolemia, unspecified: Secondary | ICD-10-CM | POA: Diagnosis not present

## 2011-05-09 DIAGNOSIS — Z981 Arthrodesis status: Secondary | ICD-10-CM | POA: Diagnosis not present

## 2011-05-09 DIAGNOSIS — Z01810 Encounter for preprocedural cardiovascular examination: Secondary | ICD-10-CM | POA: Diagnosis not present

## 2011-05-09 DIAGNOSIS — M8448XA Pathological fracture, other site, initial encounter for fracture: Secondary | ICD-10-CM | POA: Diagnosis not present

## 2011-05-09 DIAGNOSIS — Z87891 Personal history of nicotine dependence: Secondary | ICD-10-CM | POA: Diagnosis not present

## 2011-05-09 DIAGNOSIS — F329 Major depressive disorder, single episode, unspecified: Secondary | ICD-10-CM | POA: Diagnosis present

## 2011-05-09 DIAGNOSIS — M549 Dorsalgia, unspecified: Secondary | ICD-10-CM | POA: Diagnosis not present

## 2011-05-09 DIAGNOSIS — R339 Retention of urine, unspecified: Secondary | ICD-10-CM | POA: Diagnosis not present

## 2011-05-09 DIAGNOSIS — Z96649 Presence of unspecified artificial hip joint: Secondary | ICD-10-CM | POA: Diagnosis not present

## 2011-05-09 DIAGNOSIS — Z888 Allergy status to other drugs, medicaments and biological substances status: Secondary | ICD-10-CM | POA: Diagnosis not present

## 2011-05-09 DIAGNOSIS — D62 Acute posthemorrhagic anemia: Secondary | ICD-10-CM | POA: Diagnosis not present

## 2011-05-09 DIAGNOSIS — K219 Gastro-esophageal reflux disease without esophagitis: Secondary | ICD-10-CM | POA: Diagnosis present

## 2011-05-09 DIAGNOSIS — Z7982 Long term (current) use of aspirin: Secondary | ICD-10-CM | POA: Diagnosis not present

## 2011-05-09 DIAGNOSIS — M4000 Postural kyphosis, site unspecified: Secondary | ICD-10-CM | POA: Diagnosis not present

## 2011-05-09 DIAGNOSIS — S22009A Unspecified fracture of unspecified thoracic vertebra, initial encounter for closed fracture: Secondary | ICD-10-CM | POA: Diagnosis not present

## 2011-05-09 DIAGNOSIS — I10 Essential (primary) hypertension: Secondary | ICD-10-CM | POA: Diagnosis present

## 2011-05-09 DIAGNOSIS — IMO0002 Reserved for concepts with insufficient information to code with codable children: Secondary | ICD-10-CM | POA: Diagnosis not present

## 2011-05-10 DIAGNOSIS — Z01810 Encounter for preprocedural cardiovascular examination: Secondary | ICD-10-CM | POA: Diagnosis not present

## 2011-05-16 DIAGNOSIS — K560 Paralytic ileus: Secondary | ICD-10-CM | POA: Diagnosis not present

## 2011-05-16 DIAGNOSIS — M545 Low back pain, unspecified: Secondary | ICD-10-CM | POA: Diagnosis not present

## 2011-05-16 DIAGNOSIS — Z87448 Personal history of other diseases of urinary system: Secondary | ICD-10-CM | POA: Diagnosis not present

## 2011-05-16 DIAGNOSIS — K5900 Constipation, unspecified: Secondary | ICD-10-CM | POA: Diagnosis not present

## 2011-05-16 DIAGNOSIS — Z981 Arthrodesis status: Secondary | ICD-10-CM | POA: Diagnosis not present

## 2011-05-22 DIAGNOSIS — Q7649 Other congenital malformations of spine, not associated with scoliosis: Secondary | ICD-10-CM | POA: Diagnosis not present

## 2011-05-22 DIAGNOSIS — M549 Dorsalgia, unspecified: Secondary | ICD-10-CM | POA: Diagnosis not present

## 2011-05-27 ENCOUNTER — Other Ambulatory Visit: Payer: Self-pay | Admitting: Internal Medicine

## 2011-05-29 ENCOUNTER — Other Ambulatory Visit: Payer: Self-pay | Admitting: Internal Medicine

## 2011-05-30 ENCOUNTER — Other Ambulatory Visit: Payer: Self-pay | Admitting: Internal Medicine

## 2011-05-30 NOTE — Telephone Encounter (Signed)
Ok to refill 

## 2011-05-30 NOTE — Telephone Encounter (Signed)
Please advise if okay to refill as requested. Thanks.  

## 2011-06-05 ENCOUNTER — Ambulatory Visit (INDEPENDENT_AMBULATORY_CARE_PROVIDER_SITE_OTHER): Payer: Medicare Other | Admitting: Internal Medicine

## 2011-06-05 ENCOUNTER — Encounter: Payer: Self-pay | Admitting: Internal Medicine

## 2011-06-05 VITALS — BP 114/70 | HR 80 | Temp 97.9°F | Wt 150.8 lb

## 2011-06-05 DIAGNOSIS — D649 Anemia, unspecified: Secondary | ICD-10-CM

## 2011-06-05 DIAGNOSIS — R042 Hemoptysis: Secondary | ICD-10-CM

## 2011-06-05 DIAGNOSIS — M949 Disorder of cartilage, unspecified: Secondary | ICD-10-CM | POA: Diagnosis not present

## 2011-06-05 DIAGNOSIS — M899 Disorder of bone, unspecified: Secondary | ICD-10-CM

## 2011-06-05 LAB — CBC WITH DIFFERENTIAL/PLATELET
Basophils Absolute: 0.1 10*3/uL (ref 0.0–0.1)
Eosinophils Relative: 17 % — ABNORMAL HIGH (ref 0–5)
HCT: 32.5 % — ABNORMAL LOW (ref 36.0–46.0)
Hemoglobin: 10.6 g/dL — ABNORMAL LOW (ref 12.0–15.0)
Lymphocytes Relative: 33 % (ref 12–46)
Lymphs Abs: 2.3 10*3/uL (ref 0.7–4.0)
MCV: 91.8 fL (ref 78.0–100.0)
Monocytes Absolute: 0.6 10*3/uL (ref 0.1–1.0)
Neutro Abs: 3 10*3/uL (ref 1.7–7.7)
RBC: 3.54 MIL/uL — ABNORMAL LOW (ref 3.87–5.11)
RDW: 13.6 % (ref 11.5–15.5)
WBC: 7.1 10*3/uL (ref 4.0–10.5)

## 2011-06-05 LAB — APTT: aPTT: 34 seconds (ref 24–37)

## 2011-06-05 LAB — IBC PANEL: %SAT: 15 % — ABNORMAL LOW (ref 20–55)

## 2011-06-05 LAB — PROTIME-INR: INR: 1.11 (ref <1.50)

## 2011-06-05 LAB — IRON: Iron: 46 ug/dL (ref 42–145)

## 2011-06-05 NOTE — Patient Instructions (Signed)
.  Share results with all MDs seen.Please try to go on My Chart within the next 24 hours to allow me to release the results directly to you.  

## 2011-06-05 NOTE — Progress Notes (Signed)
Addended by: Silvio Pate D on: 06/05/2011 04:55 PM   Modules accepted: Orders

## 2011-06-05 NOTE — Progress Notes (Signed)
Subjective:    Patient ID: Hailey [name], female    DOB: [birth year], 71 y.o.   MRN: [id number]  HPI She had been having severe pain in her arms and legs for 7 months; it was questioned whether this was related to [facility]. Dr. Cleophas Dunker checked  calcium and parathyroid level. The calcium was mildly elevated and the parathyroid level was slightly reduced by history.  Dr.Branch, NS  at [facility] repeated a spinal procedure at the mid-upper thoracic area to correct a loose screw in her hardware. Since the surgery  2& 1/2 weeks ago she is not having pain in her arms and legs.  Postoperatively she states she inadvertently received an overdose of morphine. Family member and sitter felt she was getting inadequate pain medication and would push the button every 5 min on her morphine pump.  They did find that she is anemic and recommended specific labs.    Review of Systems She required 3 units of packed cells with the surgery. Following surgery she did have hemoptysis on several occasions can; this has resolved. She stated that she discussed this  with her Neurosurgeons assistant. She states that at that visit she coughed up "coffee-ground" bloody material.  She believes the surgeon thought the blood was related to intubation   At that followup appointment all blood pressure medicines were stopped because of hypotension. She is restarted pressure medicines as her blood pressures returned to normal  She states she was not on anticoagulants perioperatively   She denies excessive or unusual bruising or bleeding, epistaxis, hematuria, melena, or rectal bleeding.  She denies chest pain, paroxysmal nocturnal dyspnea, edema, or calf pain. She has dyspnea due to deconditioning     Objective:   Physical Exam  Gen.: Healthy and well-nourished in appearance. Alert, appropriate and cooperative throughout exam.Appears younger than stated age  Eyes: No corneal or conjunctival inflammation noted. No  icterus Ears: External  ear exam reveals no significant lesions or deformities. Canals clear .TMs normal. Hearing is grossly normal bilaterally. Nose: External nasal exam reveals no deformity or inflammation. Nasal mucosa are pink and moist. No lesions or exudates noted.   Mouth: Oral mucosa and oropharynx reveal no lesions or exudates. Teeth in good repair. Neck: No deformities, masses, or tenderness noted.  Lungs: Normal respiratory effort; chest expands symmetrically. Lungs are clear to auscultation without rales, wheezes, or increased work of breathing. Heart: Normal rate and rhythm. Normal S1 and S2. No gallop, click, or rub. S4 .; no murmur. Abdomen: Bowel sounds normal; abdomen soft and nontender. No masses, organomegaly or hernias noted.                                                                         Musculoskeletal/extremities: Spine straight due to hardware. No clubbing, cyanosis, edema, or deformity noted. Joints normal. Nail health  Good. Homan's negative Vascular: Carotid, radial artery, dorsalis pedis and  posterior tibial pulses are full and equal. No bruits present. Neurologic: Alert and oriented x3.  Skin: Intact without suspicious lesions or rashes. Lymph: No cervical, axillary lymphadenopathy present. Psych: Mood and affect are normal. Normally interactive  Assessment & Plan:  #1 anemia postoperatively  #2 hemoptysis, resolved  #3 mild hypercalcemia with no elevation of parathyroid hormone level  Plan: See orders and recommendations

## 2011-06-06 ENCOUNTER — Telehealth: Payer: Self-pay | Admitting: *Deleted

## 2011-06-06 NOTE — Telephone Encounter (Signed)
[facility] Triage Call Report Triage Record Num: [id number] Operator: Lesli Albee Patient Name: Minnetta Sandora Call Date & Time: 06/05/2011 8:17:57PM Patient Phone: [phone] PCP: Patient Gender: Female PCP Fax : Patient DOB: October 21, 1940 Practice Name: [facility] Reason for Call: Caller: Delaney Meigs; PCP: Marga Melnick; CB#: [phone]; Call regarding Delaney Meigs is calling from [place] regarding a CBC ordered on Donella Stade by Marga Melnick.; Pt 14.8; PTT 34; INR 1.11. Per standing orders: Rn called pt @ [phone]. she reports that she takes 1 baby aspirin/day. She had come into the Dr.s office today because she recently had surgery and it was reported that her "blood was thick". Rn called Dr. Darrick Huntsman. she advised no further actions need to be taken. Protocol(s) Used: PCP Calls, No Triage (Adult) Recommended Outcome per Protocol: Call Provider within 24 Hours Reason for Outcome: Lab calling with test results Care Advice: ~ 06/05/2011 9:03:25PM

## 2011-06-06 NOTE — Telephone Encounter (Signed)
Office Message [address] [place], [place] [address] p. [phone] f. [phone] To: [place] Fax: [phone] From: [facility] Date/ Time: 06/05/2011 7:38 PM Taken By: Albertine Grates, RN Caller: Zella Ball Facility: Not Collected Patient: Hailey [name], Hailey [name] DOB: 1941-01-26 Phone: [phone] Reason for Call: Caller was unable to be reached on callback - Left Message Regarding Appointment: No Appt Date: Appt Time: Unknown Provider: Reason: Details: Outcome:

## 2011-06-12 ENCOUNTER — Telehealth: Payer: Self-pay | Admitting: Internal Medicine

## 2011-06-12 ENCOUNTER — Other Ambulatory Visit: Payer: Self-pay | Admitting: Internal Medicine

## 2011-06-12 DIAGNOSIS — M549 Dorsalgia, unspecified: Secondary | ICD-10-CM | POA: Diagnosis not present

## 2011-06-12 NOTE — Telephone Encounter (Signed)
Refill done.  

## 2011-06-12 NOTE — Telephone Encounter (Signed)
Ok to refill 

## 2011-06-12 NOTE — Telephone Encounter (Signed)
Received refill request from [facility] for lorazepam 1 mg tablets # 50 take 1 to 1 1/2 tablets qhs prn  Last refill sent on 11-23-10 for # 50 tablets with 5 refills Pt last seen 09/01/10 and has ov pending 09/03/11 Please advise thanks

## 2011-06-13 MED ORDER — LORAZEPAM 1 MG PO TABS: ORAL_TABLET | ORAL | Status: DC

## 2011-06-13 NOTE — Telephone Encounter (Signed)
Called refill to pharmacy

## 2011-06-23 ENCOUNTER — Other Ambulatory Visit: Payer: Self-pay | Admitting: Internal Medicine

## 2011-06-25 NOTE — Telephone Encounter (Signed)
Lipid/hep 272.4/995.20  

## 2011-06-26 DIAGNOSIS — M47817 Spondylosis without myelopathy or radiculopathy, lumbosacral region: Secondary | ICD-10-CM | POA: Diagnosis not present

## 2011-06-26 DIAGNOSIS — M159 Polyosteoarthritis, unspecified: Secondary | ICD-10-CM | POA: Diagnosis not present

## 2011-06-26 DIAGNOSIS — M542 Cervicalgia: Secondary | ICD-10-CM | POA: Diagnosis not present

## 2011-06-26 DIAGNOSIS — M545 Low back pain, unspecified: Secondary | ICD-10-CM | POA: Diagnosis not present

## 2011-06-27 ENCOUNTER — Other Ambulatory Visit (HOSPITAL_COMMUNITY): Payer: Self-pay | Admitting: Anesthesiology

## 2011-06-27 ENCOUNTER — Ambulatory Visit (HOSPITAL_COMMUNITY)
Admission: RE | Admit: 2011-06-27 | Discharge: 2011-06-27 | Disposition: A | Discharge status: Home / Self Care | Payer: Medicare Other | Source: Ambulatory Visit | Attending: Anesthesiology | Admitting: Anesthesiology

## 2011-06-27 DIAGNOSIS — R52 Pain, unspecified: Secondary | ICD-10-CM

## 2011-06-27 DIAGNOSIS — M25569 Pain in unspecified knee: Secondary | ICD-10-CM | POA: Diagnosis not present

## 2011-06-27 DIAGNOSIS — M898X9 Other specified disorders of bone, unspecified site: Secondary | ICD-10-CM | POA: Insufficient documentation

## 2011-06-27 DIAGNOSIS — M949 Disorder of cartilage, unspecified: Secondary | ICD-10-CM | POA: Insufficient documentation

## 2011-06-27 DIAGNOSIS — M899 Disorder of bone, unspecified: Secondary | ICD-10-CM | POA: Insufficient documentation

## 2011-07-03 DIAGNOSIS — M899 Disorder of bone, unspecified: Secondary | ICD-10-CM | POA: Diagnosis not present

## 2011-07-03 DIAGNOSIS — Z8262 Family history of osteoporosis: Secondary | ICD-10-CM | POA: Diagnosis not present

## 2011-07-09 ENCOUNTER — Encounter: Payer: Self-pay | Admitting: Internal Medicine

## 2011-07-17 ENCOUNTER — Ambulatory Visit (INDEPENDENT_AMBULATORY_CARE_PROVIDER_SITE_OTHER): Payer: Medicare Other

## 2011-07-17 DIAGNOSIS — Z01419 Encounter for gynecological examination (general) (routine) without abnormal findings: Secondary | ICD-10-CM | POA: Diagnosis not present

## 2011-07-17 DIAGNOSIS — J309 Allergic rhinitis, unspecified: Secondary | ICD-10-CM | POA: Diagnosis not present

## 2011-07-17 DIAGNOSIS — Z124 Encounter for screening for malignant neoplasm of cervix: Secondary | ICD-10-CM | POA: Diagnosis not present

## 2011-07-17 DIAGNOSIS — Z0000 Encounter for general adult medical examination without abnormal findings: Secondary | ICD-10-CM | POA: Diagnosis not present

## 2011-07-18 ENCOUNTER — Encounter: Payer: Self-pay | Admitting: Internal Medicine

## 2011-07-18 DIAGNOSIS — Z124 Encounter for screening for malignant neoplasm of cervix: Secondary | ICD-10-CM | POA: Diagnosis not present

## 2011-07-25 DIAGNOSIS — M545 Low back pain, unspecified: Secondary | ICD-10-CM | POA: Diagnosis not present

## 2011-07-25 DIAGNOSIS — M4310 Spondylolisthesis, site unspecified: Secondary | ICD-10-CM | POA: Diagnosis not present

## 2011-07-25 DIAGNOSIS — M47817 Spondylosis without myelopathy or radiculopathy, lumbosacral region: Secondary | ICD-10-CM | POA: Diagnosis not present

## 2011-07-28 ENCOUNTER — Other Ambulatory Visit: Payer: Self-pay | Admitting: Internal Medicine

## 2011-08-03 ENCOUNTER — Other Ambulatory Visit (INDEPENDENT_AMBULATORY_CARE_PROVIDER_SITE_OTHER): Payer: Medicare Other

## 2011-08-03 DIAGNOSIS — D649 Anemia, unspecified: Secondary | ICD-10-CM

## 2011-08-03 LAB — CBC WITH DIFFERENTIAL/PLATELET
Basophils Absolute: 0.1 10*3/uL (ref 0.0–0.1)
Basophils Relative: 0.8 % (ref 0.0–3.0)
Eosinophils Absolute: 0.1 10*3/uL (ref 0.0–0.7)
Hemoglobin: 12.4 g/dL (ref 12.0–15.0)
Lymphocytes Relative: 29.9 % (ref 12.0–46.0)
MCHC: 33.2 g/dL (ref 30.0–36.0)
Monocytes Relative: 7.3 % (ref 3.0–12.0)
Neutrophils Relative %: 60.5 % (ref 43.0–77.0)
RBC: 4.13 Mil/uL (ref 3.87–5.11)
RDW: 15.1 % — ABNORMAL HIGH (ref 11.5–14.6)

## 2011-08-08 DIAGNOSIS — Z803 Family history of malignant neoplasm of breast: Secondary | ICD-10-CM | POA: Diagnosis not present

## 2011-08-08 DIAGNOSIS — Z1231 Encounter for screening mammogram for malignant neoplasm of breast: Secondary | ICD-10-CM | POA: Diagnosis not present

## 2011-08-13 ENCOUNTER — Other Ambulatory Visit: Payer: Medicare Other

## 2011-08-14 ENCOUNTER — Other Ambulatory Visit (INDEPENDENT_AMBULATORY_CARE_PROVIDER_SITE_OTHER): Payer: Medicare Other

## 2011-08-14 DIAGNOSIS — T887XXA Unspecified adverse effect of drug or medicament, initial encounter: Secondary | ICD-10-CM

## 2011-08-14 DIAGNOSIS — E785 Hyperlipidemia, unspecified: Secondary | ICD-10-CM | POA: Diagnosis not present

## 2011-08-14 LAB — LIPID PANEL
HDL: 68.6 mg/dL (ref >39.00)
Triglycerides: 47 mg/dL (ref 0.0–149.0)
VLDL: 9.4 mg/dL (ref 0.0–40.0)

## 2011-08-14 LAB — HEPATIC FUNCTION PANEL
ALT: 13 U/L (ref 0–35)
AST: 21 U/L (ref 0–37)
Albumin: 4.3 g/dL (ref 3.5–5.2)

## 2011-08-15 DIAGNOSIS — M810 Age-related osteoporosis without current pathological fracture: Secondary | ICD-10-CM | POA: Diagnosis not present

## 2011-08-15 DIAGNOSIS — R5381 Other malaise: Secondary | ICD-10-CM | POA: Diagnosis not present

## 2011-08-15 DIAGNOSIS — R5383 Other fatigue: Secondary | ICD-10-CM | POA: Diagnosis not present

## 2011-08-17 ENCOUNTER — Emergency Department (HOSPITAL_COMMUNITY)
Admission: EM | Admit: 2011-08-17 | Discharge: 2011-08-18 | Disposition: A | Discharge status: Home / Self Care | Payer: Medicare Other | Attending: Emergency Medicine | Admitting: Emergency Medicine

## 2011-08-17 ENCOUNTER — Emergency Department (HOSPITAL_COMMUNITY): Payer: Medicare Other

## 2011-08-17 DIAGNOSIS — W19XXXA Unspecified fall, initial encounter: Secondary | ICD-10-CM | POA: Insufficient documentation

## 2011-08-17 DIAGNOSIS — M25559 Pain in unspecified hip: Secondary | ICD-10-CM | POA: Diagnosis not present

## 2011-08-17 DIAGNOSIS — K219 Gastro-esophageal reflux disease without esophagitis: Secondary | ICD-10-CM | POA: Diagnosis not present

## 2011-08-17 DIAGNOSIS — I10 Essential (primary) hypertension: Secondary | ICD-10-CM | POA: Diagnosis not present

## 2011-08-17 DIAGNOSIS — Z79899 Other long term (current) drug therapy: Secondary | ICD-10-CM | POA: Insufficient documentation

## 2011-08-17 DIAGNOSIS — G4733 Obstructive sleep apnea (adult) (pediatric): Secondary | ICD-10-CM | POA: Diagnosis not present

## 2011-08-17 DIAGNOSIS — S7000XA Contusion of unspecified hip, initial encounter: Secondary | ICD-10-CM | POA: Insufficient documentation

## 2011-08-17 DIAGNOSIS — R6889 Other general symptoms and signs: Secondary | ICD-10-CM | POA: Diagnosis not present

## 2011-08-17 DIAGNOSIS — T148XXA Other injury of unspecified body region, initial encounter: Secondary | ICD-10-CM | POA: Diagnosis not present

## 2011-08-17 DIAGNOSIS — S7001XA Contusion of right hip, initial encounter: Secondary | ICD-10-CM

## 2011-08-17 DIAGNOSIS — Z96649 Presence of unspecified artificial hip joint: Secondary | ICD-10-CM | POA: Diagnosis not present

## 2011-08-17 MED ORDER — HYDROMORPHONE HCL PF 1 MG/ML IJ SOLN
0.5000 mg | Freq: Once | INTRAMUSCULAR | Status: AC
  Administered 2011-08-17: 0.5 mg via INTRAVENOUS
  Filled 2011-08-17: qty 1

## 2011-08-17 NOTE — ED Notes (Signed)
ZOX:[id number]<AV> Expected date:08/17/11<BR> Expected time:10:10 PM<BR> Means of arrival:Ambulance<BR> Comments:<BR> Fall; hip pain

## 2011-08-17 NOTE — ED Provider Notes (Addendum)
History     CSN: [id number]  Arrival date & time 08/17/11  2220   First MD Initiated Contact with Patient 08/17/11 2235      Chief Complaint  Patient presents with  . Fall  . Hip Pain    (Consider location/radiation/quality/duration/timing/severity/associated sxs/prior treatment) HPI Comments: Patient had a  mechanical fall landing on her R hip which has been replaced several years ago Was unable to ambulate after the fall for several hours due to the pain She did take 5mg  Oxycodone and was able to ambulate with her walker when EMS was called for transport   Patient is a 71 y.o. female presenting with fall and hip pain.  Fall The accident occurred 3 to 5 hours ago. The fall occurred while walking. She fell from a height of 3 to 5 ft. She landed on a hard floor. There was no blood loss. The point of impact was the right hip. The pain is present in the right hip. The pain is at a severity of 5/10. The pain is moderate. She was ambulatory at the scene. There was no entrapment after the fall. There was no drug use involved in the accident. There was no alcohol use involved in the accident. Pertinent negatives include no numbness and no headaches. The symptoms are aggravated by activity. She has tried rest for the symptoms. The treatment provided mild relief.  Hip Pain Associated symptoms include joint swelling. Pertinent negatives include no headaches, numbness or weakness.    Past Medical History  Diagnosis Date  . OSA (obstructive sleep apnea)   . Closed fracture of rib(s), unspecified   . 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 Esophageal reflux   . Osteoarthrosis, unspecified whether generalized or localized, unspecified site   . Chronic interstitial cystitis   . Personal history of other disorders of nervous system and sense organs   . Unspecified closed fracture of pelvis     x 2 2004/2006  . Disorder of bone and cartilage, unspecified   . Uncontrolled  hypertension as indication for native nephrectomy   . Other and unspecified hyperlipidemia   . Allergic rhinitis, cause unspecified   . Chronic insomnia     Past Surgical History  Procedure Date  . Back surgery     L4-5 fusion  . Cystoscopy   . Cervical fusion     C4-7  . Lumbar fusion     L2-3  . Hernia repair     umbilical  . Appendectomy   . Tonsillectomy   . Tubal ligation     Family History  Problem Relation Age of Onset  . Dementia Mother   . Hypertension Father   . Heart attack Father   . Cancer Maternal Aunt     breast cancer  . Alzheimer's disease Maternal Aunt   . Stroke Paternal Uncle   . Cancer Cousin     breast cancer    History  Substance Use Topics  . Smoking status: Former Smoker    Quit date: 02/13/1967  . Smokeless tobacco: Not on file  . Alcohol Use: Yes    OB History    Grav Para Term Preterm Abortions TAB SAB Ect Mult Living                  Review of Systems  Constitutional: Positive for activity change.  Musculoskeletal: Positive for joint swelling and gait problem. Negative for back pain.  Neurological: Negative for dizziness, weakness, numbness  and headaches.    Allergies  Resorcinol  Home Medications   Current Outpatient Rx  Name Route Sig Dispense Refill  . VITAMIN C 100 MG PO TABS Oral Take 100 mg by mouth daily.      . ASPIRIN 81 MG PO TABS Oral Take 81 mg by mouth daily.      . CELECOXIB 200 MG PO CAPS  TAKE 1 CAP ONCE DAILY AS NEEDED--AVOID DAILY USE DUE TO GI AND CARDIAC POTENTIAL ADVERSE EFFECTS 30 capsule 1  . CIPROFLOXACIN HCL 500 MG PO TABS Oral Take 500 mg by mouth 2 (two) times daily.    Marland Kitchen COZAAR 100 MG PO TABS  TAKE 1 TABLET ONCE DAILY. 30 each 3  . CYCLOBENZAPRINE HCL 10 MG PO TABS Oral Take 10 mg by mouth as needed. Back pain    . DIAZEPAM 5 MG PO TABS Oral Take 5 mg by mouth 3 (three) times daily.    Marland Kitchen ESCITALOPRAM OXALATE 20 MG PO TABS Oral Take 1 tablet (20 mg total) by mouth daily. 30 tablet 4  .  FERROUS SULFATE 325 (65 FE) MG PO TABS Oral Take 325 mg by mouth daily with breakfast.      . FEXOFENADINE HCL 180 MG PO TABS Oral Take 180 mg by mouth daily.      Marland Kitchen LIPITOR 40 MG PO TABS  TAKE 1 TABLET ONCE DAILY. 30 each 0    Labs due  . LORAZEPAM 1 MG PO TABS  Take 1 to 1.5 tablets qhs prn 50 tablet 5  . OSTEO BI-FLEX TRIPLE STRENGTH PO Oral Take by mouth daily.      . MULTIVITAMIN PO Oral Take by mouth 2 (two) times daily.      . OXYCODONE HCL 5 MG PO TABS Oral Take 5 mg by mouth every 6 (six) hours as needed. For pain    . ALIGN 4 MG PO CAPS Oral Take 4 mg by mouth daily.     Marland Kitchen RABEPRAZOLE SODIUM 20 MG PO TBEC Oral Take 1 tablet (20 mg total) by mouth daily. 30 tablet 4  . SPIRONOLACTONE 25 MG PO TABS  TAKE 1 TABLET ONCE DAILY. 30 tablet 5  . FORTEO [place] Subcutaneous Inject into the skin. One injection daily    . THIAMINE HCL 100 MG PO TABS Oral Take 100 mg by mouth daily.      Marland Kitchen VERAPAMIL HCL 180 MG PO CP24 Oral Take 1 capsule (180 mg total) by mouth at bedtime. 90 capsule 1  . EPINEPHRINE 0.3 MG/0.3ML IJ DEVI Intramuscular Inject 0.3 mg into the muscle once.      Marland Kitchen GABAPENTIN 600 MG PO TABS Oral Take 600 mg by mouth as needed. For pain    . HYOSCYAMINE SULFATE 0.375 MG PO TB12 Oral Take 0.375 mg by mouth every 12 (twelve) hours as needed. For stomach pain      BP 137/81  Pulse 92  Temp 98.6 F (37 C) (Oral)  Resp 16  SpO2 95%  Physical Exam  Constitutional: She is oriented to person, place, and time. She appears well-developed.  HENT:  Head: Normocephalic.  Eyes: Pupils are equal, round, and reactive to light.  Neck: Normal range of motion.  Cardiovascular: Normal rate.   Pulmonary/Chest: Effort normal.  Abdominal: There is no tenderness.  Musculoskeletal: Normal range of motion. She exhibits tenderness. She exhibits no edema.       Legs:      Stable pelvis   Neurological: She is alert and  oriented to person, place, and time.    ED Course  Procedures (including critical  care time)  Labs Reviewed - No data to display No results found.  Date: 08/30/2011  Rate: 97  Rhythm: normal sinus rhythm  QRS Axis: normal  Intervals: normal  ST/T Wave abnormalities: normal  Conduction Disutrbances:none  Narrative Interpretation:   Old EKG Reviewed: none available    1. Fall   2. Contusion of hip, right       MDM  Will xray pelvis  Xray is negative for fracture or dislocation        Arman Filter, NP 08/17/11 4098  Arman Filter, NP 08/30/11 0430

## 2011-08-17 NOTE — ED Notes (Signed)
Pt from home, lives alone.  She was moving boxes, lost her balance and fell onto her RT hip.  Denies syncope/dizziness, or blood thinners.  Hx of RT hip replacement in past.  Hx full spinal fusion.  Was able to ambulate w/walker for EMS.  CMS intact.

## 2011-08-18 NOTE — ED Provider Notes (Signed)
Medical screening examination/treatment/procedure(s) were conducted as a shared visit with non-physician practitioner(s) and myself.  I personally evaluated the patient during the encounter   Benny Lennert, MD 08/18/11 1504

## 2011-08-22 DIAGNOSIS — M549 Dorsalgia, unspecified: Secondary | ICD-10-CM | POA: Diagnosis not present

## 2011-08-22 DIAGNOSIS — Z4789 Encounter for other orthopedic aftercare: Secondary | ICD-10-CM | POA: Diagnosis not present

## 2011-08-22 DIAGNOSIS — Z981 Arthrodesis status: Secondary | ICD-10-CM | POA: Diagnosis not present

## 2011-08-30 NOTE — ED Provider Notes (Signed)
Medical screening examination/treatment/procedure(s) were performed by non-physician practitioner and as supervising physician I was immediately available for consultation/collaboration.  Olivia Mackie, MD 08/30/11 [phone]

## 2011-09-03 ENCOUNTER — Other Ambulatory Visit: Payer: Self-pay | Admitting: Internal Medicine

## 2011-09-03 ENCOUNTER — Ambulatory Visit: Payer: Medicare Other | Admitting: Internal Medicine

## 2011-09-03 NOTE — Telephone Encounter (Signed)
Dr.Hopper please advise on refill for phenergan

## 2011-09-04 ENCOUNTER — Encounter: Payer: Self-pay | Admitting: Internal Medicine

## 2011-09-04 ENCOUNTER — Ambulatory Visit (INDEPENDENT_AMBULATORY_CARE_PROVIDER_SITE_OTHER): Payer: Medicare Other | Admitting: Internal Medicine

## 2011-09-04 VITALS — BP 140/94 | HR 102 | Temp 98.5°F | Wt 140.0 lb

## 2011-09-04 DIAGNOSIS — R110 Nausea: Secondary | ICD-10-CM | POA: Diagnosis not present

## 2011-09-04 DIAGNOSIS — M899 Disorder of bone, unspecified: Secondary | ICD-10-CM

## 2011-09-04 DIAGNOSIS — M949 Disorder of cartilage, unspecified: Secondary | ICD-10-CM

## 2011-09-04 LAB — BASIC METABOLIC PANEL
CO2: 27 mEq/L (ref 19–32)
Sodium: 133 mEq/L — ABNORMAL LOW (ref 135–145)

## 2011-09-04 NOTE — Patient Instructions (Addendum)
The triggers for reflux  include stress; the "aspirin family" ; alcohol; peppermint; and caffeine (coffee, tea, cola, and chocolate). The aspirin family would include aspirin and the nonsteroidal agents such as ibuprofen &  Naproxen. Tylenol would not cause reflux. If having symptoms ; food & drink should be avoided for @ least 2 hours before going to bed. Take AcipHex before breakfast and evening meal until symptoms resolve Please try to go on My Chart within the next 24 hours to allow me to release the results directly to you.

## 2011-09-04 NOTE — Progress Notes (Signed)
  Subjective:    Patient ID: Hailey [name], female    DOB: 07/25/1940, 71 y.o.   MRN: [id number]  HPI  She describes nausea for 3-4 days associated with some anorexia. She's concerned that this is related to dehydration. She's had no vomiting for at least 2 weeks.  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 She fell and contused his hip 08/17/11. She was seen in the emergency room and placed on nonsteroidals which she took until the nausea became problematic    Review of Systems She does have reflux and has been on AcipHex for an extended period time. She is not having dysphagia. Stools are dark on iron. She is not having melena. Her gynecologist checked Hemoccult stools which were negative  Her orthopedist Dr. Cleophas Dunker  found mild elevation of calcium at 10.6 with a mildly reduced parathyroid hormone level of 8.2. Her vitamin D level was normal at 57. She is not taking calcium or vit D at this time.     Objective:   Physical Exam General appearance :thin but adequate nourishment .  Eyes: No conjunctival inflammation or scleral icterus is present.  Oral exam: Dental hygiene is good; lips and gums are healthy appearing.There is no oropharyngeal erythema or exudate noted.Tongue not dry   Heart:  Normal rate and regular rhythm. S1 and S2 normal without gallop, murmur, click, rub. S 4    Lungs:Chest clear to auscultation; no wheezes, rhonchi,rales ,or rubs present.No increased work of breathing.   Abdomen: bowel sounds normal, soft and non-tender without masses, organomegaly or hernias noted.  No guarding or rebound   Skin:Warm & dry.  Intact without suspicious lesions or rashes ; no jaundice or tenting  Lymphatic: No lymphadenopathy is noted about the head, neck, axilla            Assessment & Plan:  #1 nausea without associated vomiting, probably due to NSAIDS  #2 significant reflux  #3 recent hypercalcemia; supplementation with calcium and vitamin D has been stopped  Plan:  See orders and recommendations

## 2011-09-10 DIAGNOSIS — M25559 Pain in unspecified hip: Secondary | ICD-10-CM | POA: Diagnosis not present

## 2011-09-10 DIAGNOSIS — M4310 Spondylolisthesis, site unspecified: Secondary | ICD-10-CM | POA: Diagnosis not present

## 2011-09-10 DIAGNOSIS — M47817 Spondylosis without myelopathy or radiculopathy, lumbosacral region: Secondary | ICD-10-CM | POA: Diagnosis not present

## 2011-09-10 DIAGNOSIS — M159 Polyosteoarthritis, unspecified: Secondary | ICD-10-CM | POA: Diagnosis not present

## 2011-09-17 ENCOUNTER — Telehealth: Payer: Self-pay

## 2011-09-17 ENCOUNTER — Other Ambulatory Visit: Payer: Self-pay | Admitting: Internal Medicine

## 2011-09-17 DIAGNOSIS — M954 Acquired deformity of chest and rib: Secondary | ICD-10-CM

## 2011-09-17 NOTE — Telephone Encounter (Signed)
I spoke with patient and she would like films at [facility]   Dr.Hopper please advise

## 2011-09-17 NOTE — Telephone Encounter (Signed)
Message copied by Maurice Small on Mon Sep 17, 2011  4:07 PM ------      Message from: [name]      Created: Mon Sep 17, 2011  3:19 PM       I received a note from Dr. Cleophas Dunker concerning the possible rib abnormality. This does most likely represent an old, healed fracture; but rib detail film @ this area would be appropriate based on her letter. This can be scheduled at [facility] or [facility] as per your preference.

## 2011-09-24 ENCOUNTER — Telehealth: Payer: Self-pay | Admitting: Internal Medicine

## 2011-09-24 MED ORDER — SPIRONOLACTONE 25 MG PO TABS: ORAL_TABLET | ORAL | Status: DC

## 2011-09-24 NOTE — Telephone Encounter (Signed)
Refill: Spironolactone 25mg  tablet. Take 1 tablet once daily. Qty 30. Last fill 08-31-11

## 2011-10-04 ENCOUNTER — Encounter: Payer: Self-pay | Admitting: *Deleted

## 2011-10-04 NOTE — Telephone Encounter (Signed)
error 

## 2011-10-07 NOTE — Telephone Encounter (Signed)
Please verify Rx called in; thanks

## 2011-10-08 MED ORDER — PROMETHAZINE HCL 25 MG PO TABS: ORAL_TABLET | ORAL | Status: DC

## 2011-10-08 NOTE — Telephone Encounter (Signed)
Spoke with patient. Patient would like rx sent in to have on hand if every needed. Dr.Hopper gave the [facility] for #7 pills to be dispensed

## 2011-10-12 ENCOUNTER — Ambulatory Visit (INDEPENDENT_AMBULATORY_CARE_PROVIDER_SITE_OTHER)
Admission: RE | Admit: 2011-10-12 | Discharge: 2011-10-12 | Disposition: A | Discharge status: Home / Self Care | Payer: Medicare Other | Source: Ambulatory Visit | Attending: Internal Medicine | Admitting: Internal Medicine

## 2011-10-12 DIAGNOSIS — S2249XA Multiple fractures of ribs, unspecified side, initial encounter for closed fracture: Secondary | ICD-10-CM | POA: Diagnosis not present

## 2011-10-12 DIAGNOSIS — M954 Acquired deformity of chest and rib: Secondary | ICD-10-CM

## 2011-10-17 ENCOUNTER — Other Ambulatory Visit: Payer: Self-pay | Admitting: Internal Medicine

## 2011-10-18 ENCOUNTER — Telehealth: Payer: Self-pay | Admitting: *Deleted

## 2011-10-18 NOTE — Telephone Encounter (Signed)
Test completed already.  [name], MD       Sent: Thu October 04, 2011  1:40 PM       Hailey [name]    MRN: [id number] DOB: 20-Oct-1940    Pt Work: [phone] Pt Home: [phone]         Message     The order for rib detail at the site of the rib anomaly / enlargement will expire if she does not have the films completed in the near future.

## 2011-10-22 DIAGNOSIS — M25559 Pain in unspecified hip: Secondary | ICD-10-CM | POA: Diagnosis not present

## 2011-10-22 DIAGNOSIS — M159 Polyosteoarthritis, unspecified: Secondary | ICD-10-CM | POA: Diagnosis not present

## 2011-10-22 DIAGNOSIS — M47817 Spondylosis without myelopathy or radiculopathy, lumbosacral region: Secondary | ICD-10-CM | POA: Diagnosis not present

## 2011-11-26 ENCOUNTER — Other Ambulatory Visit: Payer: Self-pay | Admitting: Internal Medicine

## 2011-11-28 ENCOUNTER — Telehealth: Payer: Self-pay | Admitting: Internal Medicine

## 2011-11-28 DIAGNOSIS — Z23 Encounter for immunization: Secondary | ICD-10-CM | POA: Diagnosis not present

## 2011-11-28 NOTE — Telephone Encounter (Signed)
Pt informed that allergy inj was refilled and advised to keep ov f/u appt.

## 2011-11-29 ENCOUNTER — Ambulatory Visit (INDEPENDENT_AMBULATORY_CARE_PROVIDER_SITE_OTHER): Payer: Medicare Other

## 2011-11-29 DIAGNOSIS — J309 Allergic rhinitis, unspecified: Secondary | ICD-10-CM | POA: Diagnosis not present

## 2011-12-10 DIAGNOSIS — M4310 Spondylolisthesis, site unspecified: Secondary | ICD-10-CM | POA: Diagnosis not present

## 2011-12-10 DIAGNOSIS — M545 Low back pain, unspecified: Secondary | ICD-10-CM | POA: Diagnosis not present

## 2011-12-10 DIAGNOSIS — M47817 Spondylosis without myelopathy or radiculopathy, lumbosacral region: Secondary | ICD-10-CM | POA: Diagnosis not present

## 2011-12-10 DIAGNOSIS — M159 Polyosteoarthritis, unspecified: Secondary | ICD-10-CM | POA: Diagnosis not present

## 2012-01-16 DIAGNOSIS — Z09 Encounter for follow-up examination after completed treatment for conditions other than malignant neoplasm: Secondary | ICD-10-CM | POA: Diagnosis not present

## 2012-01-16 DIAGNOSIS — Q7649 Other congenital malformations of spine, not associated with scoliosis: Secondary | ICD-10-CM | POA: Diagnosis not present

## 2012-01-16 DIAGNOSIS — Z981 Arthrodesis status: Secondary | ICD-10-CM | POA: Diagnosis not present

## 2012-01-18 ENCOUNTER — Encounter: Payer: Self-pay | Admitting: Internal Medicine

## 2012-01-18 ENCOUNTER — Telehealth: Payer: Self-pay | Admitting: Internal Medicine

## 2012-01-18 ENCOUNTER — Ambulatory Visit (INDEPENDENT_AMBULATORY_CARE_PROVIDER_SITE_OTHER): Payer: Medicare Other | Admitting: Internal Medicine

## 2012-01-18 VITALS — BP 102/58 | HR 80 | Ht 68.25 in | Wt 142.2 lb

## 2012-01-18 DIAGNOSIS — J309 Allergic rhinitis, unspecified: Secondary | ICD-10-CM

## 2012-01-18 DIAGNOSIS — G4700 Insomnia, unspecified: Secondary | ICD-10-CM | POA: Diagnosis not present

## 2012-01-18 DIAGNOSIS — Z23 Encounter for immunization: Secondary | ICD-10-CM

## 2012-01-18 DIAGNOSIS — J3089 Other allergic rhinitis: Secondary | ICD-10-CM

## 2012-01-18 MED ORDER — CELECOXIB 200 MG PO CAPS: ORAL_CAPSULE | ORAL | Status: DC

## 2012-01-18 NOTE — Progress Notes (Signed)
Subjective:    Patient ID: Hailey [name], female    DOB: 26-Mar-1940, 71 y.o.   MRN: [id number]  HPI 09/01/10-  48 yoF former smoker followed for allergic rhinitis, insomnia, complicated by DGD/ surgeries/ chronic pain, GERD    Here with aide carlene Last here 08/26/08 - note reviewed She wandered in sleep so she stopped ambien, but continues lorazepam . Never sleeps well. . She is also taking pain meds and agrees we should not add more sedating meds. . Just had extensive back surgery in January. Denies cough or dysuria. Was treated for UTI.  Today poor appetite and feels hot and cold. No fever, cough or dysuria and reports incision looks clean. She feels she is doing well with allergy vaccine. She has cat and we added cat to her serum and she feels that has worked.    01/18/12-  64 yoF former smoker followed for allergic rhinitis, insomnia, complicated by DGD/ surgeries/ chronic pain, GERD     FOLLOWS FOR: trouble sleeping-using Lorazepam and not helping to fall asleep; still on allergy vaccine and no troubles Allergic rhinitis manage with allergy vaccine 1:50 G0. She says this does well and she intends to continue. Only occasional need for supplemental antihistamine. Chronic insomnia remains a major problem, exacerbated by back pain/degenerative disc disease/multiple spine surgeries/orthopedics at [facility]. She uses lorazepam supplemented with gabapentin and oxycodone. She denies daytime sleepiness and says Ambien causes sleepwalking. She goes to bed after supper and watches TV until she drifts asleep. She then wakes up around 11 or 12 noon estimating 6 or 7 hours of sleep. We discussed sleep hygiene. She is spending about 15 hours in bed.  ROS-see HPI Constitutional:   No-   weight loss, night sweats, fevers, chills, fatigue, lassitude. HEENT:   No-  headaches, difficulty swallowing, tooth/dental problems, sore throat,       No-  sneezing, itching, ear ache, nasal congestion, post nasal drip,   CV:  No-   chest pain, orthopnea, PND, swelling in lower extremities, anasarca, dizziness, palpitations Resp: No-   shortness of breath with exertion or at rest.              No-   productive cough,  No non-productive cough,  No- coughing up of blood.              No-   change in color of mucus.  No- wheezing.   Skin: No-   rash or lesions. GI:  No-   heartburn, indigestion, abdominal pain, nausea, vomiting, GU:  MS:  No-   joint pain or swelling.  .  + back pain. Neuro-     nothing unusual Psych:  No- change in mood or affect. No depression or anxiety.  No memory loss.  Objective:   Physical Exam General- Alert, Oriented, Affect-appropriate/ calm, Distress- none acute   Thin  Skin- rash-none, lesions- none, excoriation- none Lymphadenopathy- none Head- atraumatic            Eyes- Gross vision intact, PERRLA, conjunctivae clear secretions                 Ears- Hearing, canals            Nose- Clear, N0- Septal dev, mucus, polyps, erosion, perforation             Throat- Mallampati II , mucosa clear , drainage- none, tonsils- atrophic Neck- flexible , trachea midline, no stridor , thyroid nl, carotid no bruit Chest - symmetrical excursion ,  unlabored           Heart/CV- RRR , no murmur , no gallop  , no rub, nl s1 s2                           - JVD- none , edema- none, stasis changes- none, varices- none           Lung- Question fine crackles right base, wheeze- none, cough- none , dullness-none, rub- none           Chest wall-  Abd-  Br/ Gen/ Rectal- Not done, not indicated Extrem- cyanosis- none, clubbing, none, atrophy- none, strength- nl Neuro- grossly intact to observation  Assessment & Plan:

## 2012-01-18 NOTE — Telephone Encounter (Signed)
Refill: Celebrex 200 mg capsule. Take 1 cap once daily as needed -- avoid daily use due to GI and cardiac potential adverse effects. Qty 30. Last fill 12-20-11

## 2012-01-18 NOTE — Patient Instructions (Addendum)
Pneumovax  We can continue your allergy vaccine at 1:50   Suggest you only go to bed when you are ready to sleep  Try samples of Lunesta 2 mg    At time you intend to sleep    Please call if you decide you want a prescription

## 2012-01-18 NOTE — Telephone Encounter (Signed)
RX sent

## 2012-01-21 DIAGNOSIS — M159 Polyosteoarthritis, unspecified: Secondary | ICD-10-CM | POA: Diagnosis not present

## 2012-01-21 DIAGNOSIS — M47817 Spondylosis without myelopathy or radiculopathy, lumbosacral region: Secondary | ICD-10-CM | POA: Diagnosis not present

## 2012-01-25 ENCOUNTER — Other Ambulatory Visit: Payer: Self-pay | Admitting: Internal Medicine

## 2012-01-27 NOTE — Assessment & Plan Note (Signed)
She can continue allergy vaccine and seems to have done well with that. Discussed and updated Pneumovax

## 2012-01-27 NOTE — Assessment & Plan Note (Addendum)
She is spending too long in bed. This appears to be at habit developed as she cope with back pain and multiple spine surgeries. We discussed basic sleep hygiene and I recommended she only go to bed when she intended to go to sleep. We discussed lorazepam and I suggested a trial of Lunesta as an alternative which might have less tendency towards tolerance and dependence

## 2012-01-31 ENCOUNTER — Telehealth: Payer: Self-pay | Admitting: Internal Medicine

## 2012-01-31 MED ORDER — ESZOPICLONE 2 MG PO TABS: 2.0000 mg | ORAL_TABLET | Freq: Every day | ORAL | Status: DC

## 2012-01-31 NOTE — Telephone Encounter (Signed)
Pharmacy requesting rx for lunesta 2mg   #30 X3 Allergies  Allergen Reactions  . Resorcinol Swelling and Other (See Comments)    blisters   Dr Maple Hudson Please advise.  Thank you

## 2012-01-31 NOTE — Telephone Encounter (Signed)
Ok per cy  lunesta 2mg  #30 x 3 fills rx called in

## 2012-02-18 ENCOUNTER — Other Ambulatory Visit: Payer: Self-pay | Admitting: Internal Medicine

## 2012-02-20 ENCOUNTER — Other Ambulatory Visit: Payer: Self-pay | Admitting: Internal Medicine

## 2012-02-20 MED ORDER — LORAZEPAM 1 MG PO TABS: ORAL_TABLET | ORAL | Status: DC

## 2012-02-20 NOTE — Telephone Encounter (Signed)
Per CY---ok to send in refill. thanks

## 2012-02-20 NOTE — Telephone Encounter (Signed)
Rx has been called in per SN.

## 2012-02-20 NOTE — Telephone Encounter (Signed)
Pharmacy requesting  Lorazepam 1 mg <> take 1 to 1 1/2 tablet  qhs #50 x 5 Last fill 10 -14-13 appt  01-16-13 Allergies  Allergen Reactions  . Resorcinol Swelling and Other (See Comments)    blisters   Dr Maple Hudson is this ok to fill  Thank you

## 2012-03-18 ENCOUNTER — Ambulatory Visit (INDEPENDENT_AMBULATORY_CARE_PROVIDER_SITE_OTHER): Payer: Medicare Other

## 2012-03-18 DIAGNOSIS — J309 Allergic rhinitis, unspecified: Secondary | ICD-10-CM | POA: Diagnosis not present

## 2012-03-25 ENCOUNTER — Other Ambulatory Visit: Payer: Self-pay | Admitting: Internal Medicine

## 2012-04-23 ENCOUNTER — Other Ambulatory Visit: Payer: Self-pay | Admitting: Internal Medicine

## 2012-04-24 NOTE — Telephone Encounter (Signed)
Left message on VM for patient to return call when available. Reason for call: Losartan not on medication list

## 2012-04-25 ENCOUNTER — Other Ambulatory Visit: Payer: Self-pay | Admitting: Internal Medicine

## 2012-04-25 NOTE — Telephone Encounter (Signed)
Left message on voicemail for patient to return call when available   

## 2012-05-10 ENCOUNTER — Other Ambulatory Visit: Payer: Self-pay | Admitting: Internal Medicine

## 2012-05-19 DIAGNOSIS — M5430 Sciatica, unspecified side: Secondary | ICD-10-CM | POA: Diagnosis not present

## 2012-05-19 DIAGNOSIS — M47817 Spondylosis without myelopathy or radiculopathy, lumbosacral region: Secondary | ICD-10-CM | POA: Diagnosis not present

## 2012-05-20 ENCOUNTER — Telehealth: Payer: Self-pay | Admitting: *Deleted

## 2012-05-20 NOTE — Telephone Encounter (Signed)
Prior Auth approved 05-20-12 until 02-11-13, approval letter scan to chart, pharmacy faxed.

## 2012-06-02 ENCOUNTER — Other Ambulatory Visit: Payer: Self-pay | Admitting: Internal Medicine

## 2012-06-10 ENCOUNTER — Other Ambulatory Visit: Payer: Self-pay | Admitting: Internal Medicine

## 2012-06-17 DIAGNOSIS — R29818 Other symptoms and signs involving the nervous system: Secondary | ICD-10-CM | POA: Diagnosis not present

## 2012-06-17 DIAGNOSIS — M5030 Other cervical disc degeneration, unspecified cervical region: Secondary | ICD-10-CM | POA: Diagnosis not present

## 2012-06-17 DIAGNOSIS — M545 Low back pain, unspecified: Secondary | ICD-10-CM | POA: Diagnosis not present

## 2012-06-17 DIAGNOSIS — M4000 Postural kyphosis, site unspecified: Secondary | ICD-10-CM | POA: Diagnosis not present

## 2012-06-17 DIAGNOSIS — M5137 Other intervertebral disc degeneration, lumbosacral region: Secondary | ICD-10-CM | POA: Diagnosis not present

## 2012-06-17 DIAGNOSIS — M25559 Pain in unspecified hip: Secondary | ICD-10-CM | POA: Diagnosis not present

## 2012-06-17 DIAGNOSIS — M549 Dorsalgia, unspecified: Secondary | ICD-10-CM | POA: Diagnosis not present

## 2012-06-17 DIAGNOSIS — R5381 Other malaise: Secondary | ICD-10-CM | POA: Diagnosis not present

## 2012-06-17 DIAGNOSIS — R5383 Other fatigue: Secondary | ICD-10-CM | POA: Diagnosis not present

## 2012-06-17 DIAGNOSIS — Z981 Arthrodesis status: Secondary | ICD-10-CM | POA: Diagnosis not present

## 2012-06-17 DIAGNOSIS — Z96649 Presence of unspecified artificial hip joint: Secondary | ICD-10-CM | POA: Diagnosis not present

## 2012-06-18 DIAGNOSIS — M47817 Spondylosis without myelopathy or radiculopathy, lumbosacral region: Secondary | ICD-10-CM | POA: Diagnosis not present

## 2012-06-18 DIAGNOSIS — M961 Postlaminectomy syndrome, not elsewhere classified: Secondary | ICD-10-CM | POA: Diagnosis not present

## 2012-06-18 DIAGNOSIS — Z79899 Other long term (current) drug therapy: Secondary | ICD-10-CM | POA: Diagnosis not present

## 2012-06-18 DIAGNOSIS — M159 Polyosteoarthritis, unspecified: Secondary | ICD-10-CM | POA: Diagnosis not present

## 2012-06-27 ENCOUNTER — Other Ambulatory Visit: Payer: Self-pay | Admitting: Internal Medicine

## 2012-07-09 DIAGNOSIS — M545 Low back pain, unspecified: Secondary | ICD-10-CM | POA: Diagnosis not present

## 2012-07-09 DIAGNOSIS — IMO0002 Reserved for concepts with insufficient information to code with codable children: Secondary | ICD-10-CM | POA: Diagnosis not present

## 2012-07-09 DIAGNOSIS — IMO0001 Reserved for inherently not codable concepts without codable children: Secondary | ICD-10-CM | POA: Diagnosis not present

## 2012-07-09 DIAGNOSIS — Q7649 Other congenital malformations of spine, not associated with scoliosis: Secondary | ICD-10-CM | POA: Diagnosis not present

## 2012-07-09 DIAGNOSIS — R5381 Other malaise: Secondary | ICD-10-CM | POA: Diagnosis not present

## 2012-07-09 DIAGNOSIS — R29818 Other symptoms and signs involving the nervous system: Secondary | ICD-10-CM | POA: Diagnosis not present

## 2012-07-09 DIAGNOSIS — M5030 Other cervical disc degeneration, unspecified cervical region: Secondary | ICD-10-CM | POA: Diagnosis not present

## 2012-07-09 DIAGNOSIS — M5137 Other intervertebral disc degeneration, lumbosacral region: Secondary | ICD-10-CM | POA: Diagnosis not present

## 2012-07-09 DIAGNOSIS — M549 Dorsalgia, unspecified: Secondary | ICD-10-CM | POA: Diagnosis not present

## 2012-07-09 DIAGNOSIS — Z981 Arthrodesis status: Secondary | ICD-10-CM | POA: Diagnosis not present

## 2012-07-13 HISTORY — PX: OTHER SURGICAL HISTORY: SHX169

## 2012-07-21 ENCOUNTER — Other Ambulatory Visit: Payer: Self-pay | Admitting: Internal Medicine

## 2012-07-21 DIAGNOSIS — M1990 Unspecified osteoarthritis, unspecified site: Secondary | ICD-10-CM | POA: Diagnosis not present

## 2012-07-21 DIAGNOSIS — M47817 Spondylosis without myelopathy or radiculopathy, lumbosacral region: Secondary | ICD-10-CM | POA: Diagnosis not present

## 2012-07-21 DIAGNOSIS — M25559 Pain in unspecified hip: Secondary | ICD-10-CM | POA: Diagnosis not present

## 2012-07-21 DIAGNOSIS — IMO0002 Reserved for concepts with insufficient information to code with codable children: Secondary | ICD-10-CM | POA: Diagnosis not present

## 2012-08-01 ENCOUNTER — Other Ambulatory Visit: Payer: Self-pay | Admitting: Internal Medicine

## 2012-08-02 NOTE — Telephone Encounter (Signed)
#  30 with same warning on Rx. OV required due to potential adverse effects before any additional refills

## 2012-08-02 NOTE — Telephone Encounter (Signed)
Last OV 09-04-11, Last refilled 06-27-12 #30

## 2012-08-05 DIAGNOSIS — Z01419 Encounter for gynecological examination (general) (routine) without abnormal findings: Secondary | ICD-10-CM | POA: Diagnosis not present

## 2012-08-05 DIAGNOSIS — Z0000 Encounter for general adult medical examination without abnormal findings: Secondary | ICD-10-CM | POA: Diagnosis not present

## 2012-08-05 DIAGNOSIS — Z124 Encounter for screening for malignant neoplasm of cervix: Secondary | ICD-10-CM | POA: Diagnosis not present

## 2012-08-06 DIAGNOSIS — Z124 Encounter for screening for malignant neoplasm of cervix: Secondary | ICD-10-CM | POA: Diagnosis not present

## 2012-08-11 DIAGNOSIS — I119 Hypertensive heart disease without heart failure: Secondary | ICD-10-CM | POA: Diagnosis not present

## 2012-08-11 DIAGNOSIS — Z01810 Encounter for preprocedural cardiovascular examination: Secondary | ICD-10-CM | POA: Diagnosis not present

## 2012-08-11 DIAGNOSIS — R7309 Other abnormal glucose: Secondary | ICD-10-CM | POA: Diagnosis not present

## 2012-08-16 ENCOUNTER — Other Ambulatory Visit: Payer: Self-pay | Admitting: Internal Medicine

## 2012-08-18 DIAGNOSIS — J309 Allergic rhinitis, unspecified: Secondary | ICD-10-CM | POA: Diagnosis not present

## 2012-08-18 DIAGNOSIS — T84498A Other mechanical complication of other internal orthopedic devices, implants and grafts, initial encounter: Secondary | ICD-10-CM | POA: Diagnosis not present

## 2012-08-18 DIAGNOSIS — Z87891 Personal history of nicotine dependence: Secondary | ICD-10-CM | POA: Diagnosis not present

## 2012-08-18 DIAGNOSIS — M4120 Other idiopathic scoliosis, site unspecified: Secondary | ICD-10-CM | POA: Diagnosis not present

## 2012-08-18 DIAGNOSIS — T8489XA Other specified complication of internal orthopedic prosthetic devices, implants and grafts, initial encounter: Secondary | ICD-10-CM | POA: Diagnosis not present

## 2012-08-18 DIAGNOSIS — F411 Generalized anxiety disorder: Secondary | ICD-10-CM | POA: Diagnosis present

## 2012-08-18 DIAGNOSIS — Z981 Arthrodesis status: Secondary | ICD-10-CM | POA: Diagnosis not present

## 2012-08-18 DIAGNOSIS — I10 Essential (primary) hypertension: Secondary | ICD-10-CM | POA: Diagnosis present

## 2012-08-18 DIAGNOSIS — Z472 Encounter for removal of internal fixation device: Secondary | ICD-10-CM | POA: Diagnosis not present

## 2012-08-18 DIAGNOSIS — K219 Gastro-esophageal reflux disease without esophagitis: Secondary | ICD-10-CM | POA: Diagnosis present

## 2012-08-18 DIAGNOSIS — E785 Hyperlipidemia, unspecified: Secondary | ICD-10-CM | POA: Diagnosis present

## 2012-08-18 DIAGNOSIS — G4733 Obstructive sleep apnea (adult) (pediatric): Secondary | ICD-10-CM | POA: Diagnosis present

## 2012-08-18 NOTE — Telephone Encounter (Signed)
Schedule CPX 

## 2012-08-20 ENCOUNTER — Telehealth: Payer: Self-pay | Admitting: Internal Medicine

## 2012-08-20 ENCOUNTER — Ambulatory Visit (INDEPENDENT_AMBULATORY_CARE_PROVIDER_SITE_OTHER): Payer: Medicare Other

## 2012-08-20 DIAGNOSIS — J309 Allergic rhinitis, unspecified: Secondary | ICD-10-CM

## 2012-08-20 MED ORDER — ESZOPICLONE 2 MG PO TABS: 2.0000 mg | ORAL_TABLET | Freq: Every day | ORAL | Status: DC

## 2012-08-20 NOTE — Telephone Encounter (Signed)
Ok to refill Lunesta 

## 2012-08-20 NOTE — Telephone Encounter (Signed)
Called refill to pharmacy voicemail.  

## 2012-09-02 DIAGNOSIS — M545 Low back pain, unspecified: Secondary | ICD-10-CM | POA: Diagnosis not present

## 2012-09-05 DIAGNOSIS — E559 Vitamin D deficiency, unspecified: Secondary | ICD-10-CM | POA: Diagnosis not present

## 2012-09-05 DIAGNOSIS — M810 Age-related osteoporosis without current pathological fracture: Secondary | ICD-10-CM | POA: Diagnosis not present

## 2012-09-09 ENCOUNTER — Other Ambulatory Visit: Payer: Self-pay | Admitting: Internal Medicine

## 2012-09-10 NOTE — Telephone Encounter (Signed)
SCHEDULED CPX

## 2012-09-12 ENCOUNTER — Other Ambulatory Visit: Payer: Self-pay | Admitting: Internal Medicine

## 2012-09-12 NOTE — Telephone Encounter (Signed)
Schedule medication management appointment

## 2012-09-18 DIAGNOSIS — IMO0002 Reserved for concepts with insufficient information to code with codable children: Secondary | ICD-10-CM | POA: Diagnosis not present

## 2012-09-18 DIAGNOSIS — M47817 Spondylosis without myelopathy or radiculopathy, lumbosacral region: Secondary | ICD-10-CM | POA: Diagnosis not present

## 2012-09-18 DIAGNOSIS — M4310 Spondylolisthesis, site unspecified: Secondary | ICD-10-CM | POA: Diagnosis not present

## 2012-09-18 DIAGNOSIS — Z79899 Other long term (current) drug therapy: Secondary | ICD-10-CM | POA: Diagnosis not present

## 2012-09-19 ENCOUNTER — Other Ambulatory Visit: Payer: Self-pay | Admitting: Internal Medicine

## 2012-09-19 NOTE — Telephone Encounter (Signed)
Per Dr.Hopper ok to give 15, patient with pending appointment

## 2012-09-23 ENCOUNTER — Encounter: Payer: Self-pay | Admitting: Internal Medicine

## 2012-09-23 ENCOUNTER — Other Ambulatory Visit: Payer: Self-pay

## 2012-09-23 ENCOUNTER — Ambulatory Visit (INDEPENDENT_AMBULATORY_CARE_PROVIDER_SITE_OTHER): Payer: Medicare Other | Admitting: Internal Medicine

## 2012-09-23 VITALS — BP 106/70 | HR 81 | Wt 140.0 lb

## 2012-09-23 DIAGNOSIS — M2550 Pain in unspecified joint: Secondary | ICD-10-CM | POA: Diagnosis not present

## 2012-09-23 MED ORDER — LORAZEPAM 1 MG PO TABS: ORAL_TABLET | ORAL | Status: DC

## 2012-09-23 MED ORDER — CELECOXIB 200 MG PO CAPS: ORAL_CAPSULE | ORAL | Status: DC

## 2012-09-23 NOTE — Progress Notes (Signed)
  Subjective:    Patient ID: Hailey [name], female    DOB: Jul 16, 1940, 72 y.o.   MRN: [id number]  HPI   She is here to discuss refill of Celebrex which she takes on average once daily. It is employed for advanced degenerative joint disease/osteoarthritis for which she's had multiple back surgeries. She also has pain in her hands, hips, & knees. Every other day Celebrex was not of benefit.  She is additionally on low-dose prophylactic aspirin.  She's followed by Dr. Vear Clock in his chronic pain clinic.  She recently had her 8th back surgery at [facility]; labs were drawn there. Her most recent liver function test were here in July 2013. Both AST and ALT were within normal limits.    Review of Systems   She denies chest pain, palpitations, or claudication. She does have dyspnea which she relates to deconditioning   She denies significant dyspepsia , hoarseness, dysphagia, abdominal pain, rectal bleeding, or melena.  She does alternate between constipation and diarrhea. Align is of ? benefit     Objective:   Physical Exam General appearance : thin but in good health and nourishment w/o distress.  Eyes: No conjunctival inflammation or scleral icterus is present.  Oral exam: Dental hygiene is good; lips and gums are healthy appearing.There is no oropharyngeal erythema or exudate noted.   Heart:  Normal rate and regular rhythm. S1 and S2 normal without gallop, murmur, click, rub or other extra sounds  . S4   Lungs:Chest clear to auscultation; no wheezes, rhonchi,rales ,or rubs present.No increased work of breathing.   Abdomen: bowel sounds normal, soft and non-tender without masses, organomegaly or hernias noted.  No guarding or rebound   Skin:Warm & dry.  Intact without suspicious lesions or rashes ; no jaundice or tenting  Lymphatic: No lymphadenopathy is noted about the head, neck, axilla.             Assessment & Plan:  #1 multiple joint advanced degenerative joint  disease. Celebrex has been of benefit. Of concern is his increased cardiovascular and GI risk long-term.  #2 irritable bowel symptoms, suboptimal response to Align. A trial of Florastor is recommended  Plan: A prescription will be renewed for Celebrex; she'll be asked to discuss whether tramadol may be a safer substitute when she sees Dr. Vear Clock.

## 2012-09-23 NOTE — Telephone Encounter (Signed)
Rx has been called in per CY. 

## 2012-09-23 NOTE — Patient Instructions (Addendum)
Please take the probiotic , Florastor every day until the bowels are normal. This will replace the normal bacteria which  are necessary for formation of normal stool and processing of food.  I will refill the Celebrex; but ask Dr. Vear Clock if tramadol would be a good alternative in the context of your medication regimen instead

## 2012-09-23 NOTE — Telephone Encounter (Signed)
Ok to refill now and 5 months

## 2012-09-23 NOTE — Telephone Encounter (Signed)
Patient was in office today to see Dr.Hopper and requested that message be sent to Dr.Young to request refill on Lorazepam. Per patient the pharmacy has contacted MD several times with no response.  Last OV 01/18/2012, last filled 02/20/12 #50/5 refills  Dr.young please advise

## 2012-10-14 DIAGNOSIS — Z79899 Other long term (current) drug therapy: Secondary | ICD-10-CM | POA: Diagnosis not present

## 2012-10-14 DIAGNOSIS — M47817 Spondylosis without myelopathy or radiculopathy, lumbosacral region: Secondary | ICD-10-CM | POA: Diagnosis not present

## 2012-10-14 DIAGNOSIS — M159 Polyosteoarthritis, unspecified: Secondary | ICD-10-CM | POA: Diagnosis not present

## 2012-10-15 ENCOUNTER — Encounter: Payer: Self-pay | Admitting: Internal Medicine

## 2012-10-20 ENCOUNTER — Other Ambulatory Visit: Payer: Self-pay | Admitting: Internal Medicine

## 2012-10-20 DIAGNOSIS — Z4789 Encounter for other orthopedic aftercare: Secondary | ICD-10-CM | POA: Diagnosis not present

## 2012-10-20 DIAGNOSIS — Z981 Arthrodesis status: Secondary | ICD-10-CM | POA: Diagnosis not present

## 2012-10-22 ENCOUNTER — Ambulatory Visit (INDEPENDENT_AMBULATORY_CARE_PROVIDER_SITE_OTHER): Payer: Medicare Other

## 2012-10-22 DIAGNOSIS — J309 Allergic rhinitis, unspecified: Secondary | ICD-10-CM | POA: Diagnosis not present

## 2012-10-23 NOTE — Telephone Encounter (Signed)
Last seen 09/23/12 and BMP done on 09/04/11. Please advise      KP

## 2012-10-23 NOTE — Telephone Encounter (Signed)
OK X 1; she should have BMET to verify that the potassium is not excessively elevated and spironolactone has not impacted renal function. I saw no records from [facility] concerning kidney function; her last renal function test was in July 2013. Code: 401.9

## 2012-10-24 NOTE — Telephone Encounter (Signed)
Letter mailed     KP 

## 2012-10-29 DIAGNOSIS — M899 Disorder of bone, unspecified: Secondary | ICD-10-CM | POA: Diagnosis not present

## 2012-10-29 DIAGNOSIS — Z1231 Encounter for screening mammogram for malignant neoplasm of breast: Secondary | ICD-10-CM | POA: Diagnosis not present

## 2012-10-31 ENCOUNTER — Other Ambulatory Visit: Payer: Self-pay | Admitting: Internal Medicine

## 2012-11-05 NOTE — Telephone Encounter (Signed)
Med filled.  

## 2012-11-09 ENCOUNTER — Encounter: Payer: Self-pay | Admitting: Internal Medicine

## 2012-11-10 ENCOUNTER — Ambulatory Visit (AMBULATORY_SURGERY_CENTER): Payer: Self-pay | Admitting: *Deleted

## 2012-11-10 VITALS — Ht 67.0 in | Wt 141.4 lb

## 2012-11-10 DIAGNOSIS — Z1211 Encounter for screening for malignant neoplasm of colon: Secondary | ICD-10-CM

## 2012-11-10 MED ORDER — NA SULFATE-K SULFATE-MG SULF 17.5-3.13-1.6 GM/177ML PO SOLN: ORAL | Status: DC

## 2012-11-10 NOTE — Progress Notes (Signed)
No allergies to eggs or soy. No problems with anesthesia.  

## 2012-11-11 ENCOUNTER — Telehealth: Payer: Self-pay | Admitting: Internal Medicine

## 2012-11-11 DIAGNOSIS — I10 Essential (primary) hypertension: Secondary | ICD-10-CM

## 2012-11-11 NOTE — Telephone Encounter (Signed)
Future lab order for BMP was sent.//AB/CMA

## 2012-11-11 NOTE — Telephone Encounter (Signed)
Patient received a letter in the mail to have there potassium check. Schedule to have done on 11/17/2012 at 3:30pm. Please advise me with orders thanks

## 2012-11-14 ENCOUNTER — Other Ambulatory Visit: Payer: Self-pay | Admitting: Internal Medicine

## 2012-11-14 NOTE — Telephone Encounter (Signed)
Lipitor refill sent to pharmacy.

## 2012-11-17 ENCOUNTER — Other Ambulatory Visit: Payer: Medicare Other

## 2012-11-19 ENCOUNTER — Other Ambulatory Visit (INDEPENDENT_AMBULATORY_CARE_PROVIDER_SITE_OTHER): Payer: Medicare Other

## 2012-11-19 ENCOUNTER — Other Ambulatory Visit: Payer: Self-pay | Admitting: Internal Medicine

## 2012-11-19 ENCOUNTER — Ambulatory Visit (INDEPENDENT_AMBULATORY_CARE_PROVIDER_SITE_OTHER): Payer: Medicare Other

## 2012-11-19 DIAGNOSIS — I10 Essential (primary) hypertension: Secondary | ICD-10-CM | POA: Diagnosis not present

## 2012-11-19 DIAGNOSIS — Z23 Encounter for immunization: Secondary | ICD-10-CM | POA: Diagnosis not present

## 2012-11-19 NOTE — Telephone Encounter (Signed)
OK X1 

## 2012-11-19 NOTE — Telephone Encounter (Signed)
Celebrex Last OV: 09/23/2012 Last Refill: 09/23/2012

## 2012-11-20 ENCOUNTER — Other Ambulatory Visit: Payer: Self-pay | Admitting: *Deleted

## 2012-11-20 ENCOUNTER — Encounter: Payer: Self-pay | Admitting: Internal Medicine

## 2012-11-20 DIAGNOSIS — M810 Age-related osteoporosis without current pathological fracture: Secondary | ICD-10-CM | POA: Diagnosis not present

## 2012-11-20 DIAGNOSIS — E559 Vitamin D deficiency, unspecified: Secondary | ICD-10-CM | POA: Diagnosis not present

## 2012-11-20 DIAGNOSIS — R5381 Other malaise: Secondary | ICD-10-CM | POA: Diagnosis not present

## 2012-11-20 DIAGNOSIS — M2550 Pain in unspecified joint: Secondary | ICD-10-CM

## 2012-11-20 LAB — BASIC METABOLIC PANEL
BUN: 21 mg/dL (ref 6–23)
CO2: 26 mEq/L (ref 19–32)
Calcium: 9.8 mg/dL (ref 8.4–10.5)
Chloride: 95 mEq/L — ABNORMAL LOW (ref 96–112)
Creatinine, Ser: 0.8 mg/dL (ref 0.4–1.2)
Glucose, Bld: 98 mg/dL (ref 70–99)
Potassium: 5 mEq/L (ref 3.5–5.1)

## 2012-11-20 MED ORDER — CELECOXIB 200 MG PO CAPS: ORAL_CAPSULE | ORAL | Status: DC

## 2012-11-20 NOTE — Telephone Encounter (Signed)
Celebrex refilled 

## 2012-11-24 ENCOUNTER — Encounter: Payer: Self-pay | Admitting: Internal Medicine

## 2012-11-24 ENCOUNTER — Ambulatory Visit (AMBULATORY_SURGERY_CENTER): Payer: Medicare Other | Admitting: Internal Medicine

## 2012-11-24 VITALS — BP 132/80 | HR 70 | Temp 98.2°F | Resp 17 | Ht 67.0 in | Wt 141.0 lb

## 2012-11-24 DIAGNOSIS — D126 Benign neoplasm of colon, unspecified: Secondary | ICD-10-CM | POA: Diagnosis not present

## 2012-11-24 DIAGNOSIS — G4733 Obstructive sleep apnea (adult) (pediatric): Secondary | ICD-10-CM | POA: Diagnosis not present

## 2012-11-24 DIAGNOSIS — K648 Other hemorrhoids: Secondary | ICD-10-CM

## 2012-11-24 DIAGNOSIS — K644 Residual hemorrhoidal skin tags: Secondary | ICD-10-CM

## 2012-11-24 DIAGNOSIS — I10 Essential (primary) hypertension: Secondary | ICD-10-CM | POA: Diagnosis not present

## 2012-11-24 DIAGNOSIS — Z1211 Encounter for screening for malignant neoplasm of colon: Secondary | ICD-10-CM

## 2012-11-24 MED ORDER — SODIUM CHLORIDE 0.9 % IV SOLN: 500.0000 mL | INTRAVENOUS | Status: DC

## 2012-11-24 NOTE — Patient Instructions (Addendum)
I found and removed two tiny polyps that look benign. You also have hemorrhoids.  I will let you know pathology results and when to have another routine colonoscopy by mail. You may not need another routine colonoscopy.  If you have hemorrhoid problems (swelling, itching, bleeding) I am able to treat those with an in-office procedure. If you like, please call my office at [phone] to schedule an appointment and I can evaluate you further.  I appreciate the opportunity to care for you. Iva Boop, MD, FACG   YOU HAD AN ENDOSCOPIC PROCEDURE TODAY AT THE [facility]: Refer to the procedure report that was given to you for any specific questions about what was found during the examination.  If the procedure report does not answer your questions, please call your gastroenterologist to clarify.  If you requested that your care partner not be given the details of your procedure findings, then the procedure report has been included in a sealed envelope for you to review at your convenience later.  YOU SHOULD EXPECT: Some feelings of bloating in the abdomen. Passage of more gas than usual.  Walking can help get rid of the air that was put into your GI tract during the procedure and reduce the bloating. If you had a lower endoscopy (such as a colonoscopy or flexible sigmoidoscopy) you may notice spotting of blood in your stool or on the toilet paper. If you underwent a bowel prep for your procedure, then you may not have a normal bowel movement for a few days.  DIET: Your first meal following the procedure should be a light meal and then it is ok to progress to your normal diet.  A half-sandwich or bowl of soup is an example of a good first meal.  Heavy or fried foods are harder to digest and may make you feel nauseous or bloated.  Likewise meals heavy in dairy and vegetables can cause extra gas to form and this can also increase the bloating.  Drink plenty of fluids but you should avoid  alcoholic beverages for 24 hours.  ACTIVITY: Your care partner should take you home directly after the procedure.  You should plan to take it easy, moving slowly for the rest of the day.  You can resume normal activity the day after the procedure however you should NOT DRIVE or use heavy machinery for 24 hours (because of the sedation medicines used during the test).    SYMPTOMS TO REPORT IMMEDIATELY: A gastroenterologist can be reached at any hour.  During normal business hours, 8:30 AM to 5:00 PM Monday through Friday, call [phone].  After hours and on weekends, please call the GI answering service at [phone] who will take a message and have the physician on call contact you.   Following lower endoscopy (colonoscopy or flexible sigmoidoscopy):  Excessive amounts of blood in the stool  Significant tenderness or worsening of abdominal pains  Swelling of the abdomen that is new, acute  Fever of 100F or higher  FOLLOW UP: If any biopsies were taken you will be contacted by phone or by letter within the next 1-3 weeks.  Call your gastroenterologist if you have not heard about the biopsies in 3 weeks.  Our staff will call the home number listed on your records the next business day following your procedure to check on you and address any questions or concerns that you may have at that time regarding the information given to you following your procedure. This  is a courtesy call and so if there is no answer at the home number and we have not heard from you through the emergency physician on call, we will assume that you have returned to your regular daily activities without incident.  SIGNATURES/CONFIDENTIALITY: You and/or your care partner have signed paperwork which will be entered into your electronic medical record.  These signatures attest to the fact that that the information above on your After Visit Summary has been reviewed and is understood.  Full responsibility of the  confidentiality of this discharge information lies with you and/or your care-partner.  Please read over handouts about polyps and hemorrhoids  Continue your normal medications  Please read over handout about hemorrhoid procedure and call Dr. Marvell Fuller office if you have any questions or would like to set up appointment

## 2012-11-24 NOTE — Progress Notes (Signed)
Procedure ends, to recovery, report given and VSS. 

## 2012-11-24 NOTE — Op Note (Signed)
[facility] [address] [place], [address]   COLONOSCOPY PROCEDURE REPORT  PATIENT: Hailey [name], Hailey [name]  MR#: [id number] BIRTHDATE: 1940/10/24 , 72  yrs. old GENDER: Female ENDOSCOPIST: Iva Boop, MD, [facility] PROCEDURE DATE:  11/24/2012 PROCEDURE:   Colonoscopy with snare polypectomy First Screening Colonoscopy - Avg.  risk and is 50 yrs.  old or older - No.  Prior Negative Screening - Now for repeat screening. 10 or more years since last screening  History of Adenoma - Now for follow-up colonoscopy & has been > or = to 3 yrs.  N/A  Polyps Removed Today? Yes. ASA CLASS:   Class II INDICATIONS:average risk screening and Last colonoscopy performed 10 years ago. MEDICATIONS: Propofol (Diprivan) 180 mg IV, MAC sedation, administered by CRNA, and These medications were titrated to patient response per physician's verbal order  DESCRIPTION OF PROCEDURE:   After the risks benefits and alternatives of the procedure were thoroughly explained, informed consent was obtained.  A digital rectal exam revealed no abnormalities of the rectum.   The LB [id number]  endoscope was introduced through the anus and advanced to the cecum, which was identified by both the appendix and ileocecal valve. No adverse events experienced.   The quality of the prep was Suprep good  The instrument was then slowly withdrawn as the colon was full  COLON FINDINGS: Two diminutive sessile polyps were found in the sigmoid colon and transverse colon.  A polypectomy was performed with a cold snare.  The resection was complete and the polyp tissue was completely retrieved.   The colon mucosa was otherwise normal. Retroflexed views revealed internal/external hemorrhoids. The time to cecum=6 minutes 03 seconds.  Withdrawal time=6 minutes 34 seconds.  The scope was withdrawn and the procedure completed. COMPLICATIONS: There were no complications.  ENDOSCOPIC IMPRESSION: 1.   Two diminutive  sessile polyps were found in the sigmoid colon and transverse colon; polypectomy was performed with a cold snare 2.   The colon mucosa was otherwise normal - good prep 3.   Internal hemorrhoids 4.   External hemorrhoids  RECOMMENDATIONS: 1.  Await pathology results - might not need another routine colonoscopy , office visit recall may make more sense 2.   Consider hemorrhoid banding if the hemorrhoids are symptomatic.  eSigned:  Iva Boop, MD, [facility] 11/24/2012 2:46 PM   cc: The Patient

## 2012-11-24 NOTE — Progress Notes (Signed)
Patient did not experience any of the following events: a burn prior to discharge; a fall within the facility; wrong site/side/patient/procedure/implant event; or a hospital transfer or hospital admission upon discharge from the facility. (G8907) Patient did not have preoperative order for IV antibiotic SSI prophylaxis. (G8918)  

## 2012-11-24 NOTE — Progress Notes (Signed)
Called to room to assist during endoscopic procedure.  Patient ID and intended procedure confirmed with present staff. Received instructions for my participation in the procedure from the performing physician.  

## 2012-11-25 ENCOUNTER — Telehealth: Payer: Self-pay | Admitting: *Deleted

## 2012-11-25 NOTE — Telephone Encounter (Signed)
  Follow up Call-  Call back number 11/24/2012  Post procedure Call Back phone  # [phone]  Permission to leave phone message Yes     Patient questions:  Do you have a fever, pain , or abdominal swelling? no Pain Score  0 *  Have you tolerated food without any problems? yes  Have you been able to return to your normal activities? yes  Do you have any questions about your discharge instructions: Diet   no Medications  no Follow up visit  no  Do you have questions or concerns about your Care? no  Actions: * If pain score is 4 or above: No action needed, pain <4.

## 2012-11-26 DIAGNOSIS — M47817 Spondylosis without myelopathy or radiculopathy, lumbosacral region: Secondary | ICD-10-CM | POA: Diagnosis not present

## 2012-11-26 DIAGNOSIS — Z79899 Other long term (current) drug therapy: Secondary | ICD-10-CM | POA: Diagnosis not present

## 2012-11-26 DIAGNOSIS — M159 Polyosteoarthritis, unspecified: Secondary | ICD-10-CM | POA: Diagnosis not present

## 2012-11-28 ENCOUNTER — Encounter: Payer: Self-pay | Admitting: Internal Medicine

## 2012-11-28 DIAGNOSIS — Z860100 Personal history of colon polyps, unspecified: Secondary | ICD-10-CM | POA: Insufficient documentation

## 2012-11-28 HISTORY — DX: Personal history of colon polyps, unspecified: Z86.0100

## 2012-11-28 HISTORY — DX: Personal history of colonic polyps: Z86.010

## 2012-11-28 NOTE — Progress Notes (Signed)
Quick Note:  2 diminutive adenomas REV recall 2019 ______

## 2012-11-30 ENCOUNTER — Other Ambulatory Visit: Payer: Self-pay | Admitting: Internal Medicine

## 2012-12-01 ENCOUNTER — Other Ambulatory Visit: Payer: Self-pay | Admitting: *Deleted

## 2012-12-01 MED ORDER — VERAPAMIL HCL ER 180 MG PO CP24: 180.0000 mg | ORAL_CAPSULE | Freq: Every day | ORAL | Status: DC

## 2012-12-01 MED ORDER — SPIRONOLACTONE 25 MG PO TABS: ORAL_TABLET | ORAL | Status: DC

## 2012-12-01 NOTE — Telephone Encounter (Signed)
Verapamil and Spironolactone refills sent to pharmacy

## 2012-12-17 DIAGNOSIS — Z4789 Encounter for other orthopedic aftercare: Secondary | ICD-10-CM | POA: Diagnosis not present

## 2012-12-17 DIAGNOSIS — M5030 Other cervical disc degeneration, unspecified cervical region: Secondary | ICD-10-CM | POA: Diagnosis not present

## 2012-12-17 DIAGNOSIS — Z981 Arthrodesis status: Secondary | ICD-10-CM | POA: Diagnosis not present

## 2013-01-16 ENCOUNTER — Ambulatory Visit (INDEPENDENT_AMBULATORY_CARE_PROVIDER_SITE_OTHER): Payer: Medicare Other | Admitting: Internal Medicine

## 2013-01-16 ENCOUNTER — Encounter: Payer: Self-pay | Admitting: Internal Medicine

## 2013-01-16 ENCOUNTER — Ambulatory Visit (INDEPENDENT_AMBULATORY_CARE_PROVIDER_SITE_OTHER)
Admission: RE | Admit: 2013-01-16 | Discharge: 2013-01-16 | Disposition: A | Discharge status: Home / Self Care | Payer: Medicare Other | Source: Ambulatory Visit | Attending: Internal Medicine | Admitting: Internal Medicine

## 2013-01-16 VITALS — BP 110/64 | HR 81 | Ht 68.25 in | Wt 146.4 lb

## 2013-01-16 DIAGNOSIS — Z87891 Personal history of nicotine dependence: Secondary | ICD-10-CM

## 2013-01-16 DIAGNOSIS — I10 Essential (primary) hypertension: Secondary | ICD-10-CM | POA: Diagnosis not present

## 2013-01-16 DIAGNOSIS — J449 Chronic obstructive pulmonary disease, unspecified: Secondary | ICD-10-CM | POA: Diagnosis not present

## 2013-01-16 DIAGNOSIS — J309 Allergic rhinitis, unspecified: Secondary | ICD-10-CM

## 2013-01-16 DIAGNOSIS — J9819 Other pulmonary collapse: Secondary | ICD-10-CM | POA: Diagnosis not present

## 2013-01-16 DIAGNOSIS — Z0000 Encounter for general adult medical examination without abnormal findings: Secondary | ICD-10-CM | POA: Diagnosis not present

## 2013-01-16 DIAGNOSIS — J3089 Other allergic rhinitis: Secondary | ICD-10-CM

## 2013-01-16 DIAGNOSIS — J302 Other seasonal allergic rhinitis: Secondary | ICD-10-CM

## 2013-01-16 MED ORDER — EPINEPHRINE 0.3 MG/0.3ML IJ SOAJ: INTRAMUSCULAR | Status: DC

## 2013-01-16 MED ORDER — DOXYCYCLINE HYCLATE 100 MG PO TABS: ORAL_TABLET | ORAL | Status: DC

## 2013-01-16 NOTE — Progress Notes (Signed)
Subjective:    Patient ID: Hailey [name], female    DOB: 1941-02-09, 72 y.o.   MRN: [id number]  HPI 09/01/10-  44 yoF former smoker followed for allergic rhinitis, insomnia, complicated by DGD/ surgeries/ chronic pain, GERD    Here with aide carlene Last here 08/26/08 - note reviewed She wandered in sleep so she stopped ambien, but continues lorazepam . Never sleeps well. . She is also taking pain meds and agrees we should not add more sedating meds. . Just had extensive back surgery in January. Denies cough or dysuria. Was treated for UTI.  Today poor appetite and feels hot and cold. No fever, cough or dysuria and reports incision looks clean. She feels she is doing well with allergy vaccine. She has cat and we added cat to her serum and she feels that has worked.    01/18/12-  12 yoF former smoker followed for allergic rhinitis, insomnia, complicated by DGD/ surgeries/ chronic pain, GERD     FOLLOWS FOR: trouble sleeping-using Lorazepam and not helping to fall asleep; still on allergy vaccine and no troubles Allergic rhinitis manage with allergy vaccine 1:50 G0. She says this does well and she intends to continue. Only occasional need for supplemental antihistamine. Chronic insomnia remains a major problem, exacerbated by back pain/degenerative disc disease/multiple spine surgeries/orthopedics at [facility]. She uses lorazepam supplemented with gabapentin and oxycodone. She denies daytime sleepiness and says Ambien causes sleepwalking. She goes to bed after supper and watches TV until she drifts asleep. She then wakes up around 11 or 12 noon estimating 6 or 7 hours of sleep. We discussed sleep hygiene. She is spending about 15 hours in bed.  01/16/13-  64 yoF former smoker followed for allergic rhinitis, insomnia, complicated by DGD/ surgeries/ chronic pain, GERD  FOLLOWS FOR: uses Lunesta to help at times; still on allergy vaccine 1:50 GO, having sniffles. She feels allergy vaccine does well for her.  We discussed EpiPen refill. She again reminds me she has had a total of 8 back surgeries. Physical discomfort contributes to chronic insomnia. Lunesta usually works.  ROS-see HPI Constitutional:   No-   weight loss, night sweats, fevers, chills, fatigue, lassitude. HEENT:   No-  headaches, difficulty swallowing, tooth/dental problems, sore throat,       No-  sneezing, itching, ear ache, nasal congestion, post nasal drip,  CV:  No-   chest pain, orthopnea, PND, swelling in lower extremities, anasarca, dizziness, palpitations Resp: No-   shortness of breath with exertion or at rest.              No-   productive cough,  No non-productive cough,  No- coughing up of blood.              No-   change in color of mucus.  No- wheezing.   Skin: No-   rash or lesions. GI:  No-   heartburn, indigestion, abdominal pain, nausea, vomiting, GU:  MS:  No-   joint pain or swelling.  .  + back pain. Neuro-     nothing unusual Psych:  No- change in mood or affect. No depression or anxiety.  No memory loss.  Objective:   Physical Exam General- Alert, Oriented, Affect-appropriate/ calm, Distress- none acute   Thin  Skin- rash-none, lesions- none, excoriation- none Lymphadenopathy- none Head- atraumatic            Eyes- Gross vision intact, PERRLA, conjunctivae clear secretions  Ears- Hearing, canals            Nose- Clear, No- Septal dev, mucus, polyps, erosion, perforation             Throat- Mallampati II , mucosa clear , drainage- none, tonsils- atrophic Neck- flexible , trachea midline, no stridor , thyroid nl, carotid no bruit Chest - symmetrical excursion , unlabored           Heart/CV- RRR , no murmur , no gallop  , no rub, nl s1 s2                           - JVD- none , edema- none, stasis changes- none, varices- none           Lung- clear, wheeze- none, cough- none , dullness-none, rub- none           Chest wall-  Abd-  Br/ Gen/ Rectal- Not done, not indicated Extrem-  cyanosis- none, clubbing, none, atrophy- none, strength- nl Neuro- grossly intact to observation  Assessment & Plan:

## 2013-01-16 NOTE — Patient Instructions (Signed)
Order- CXR   Dx history of tobacco use  Script for Epipen refill in case of severe allergic reaction to allergy vaccine  Script to hold for doxycycline antibiotic  Please call as needed for refills

## 2013-01-21 ENCOUNTER — Encounter: Payer: Self-pay | Admitting: Internal Medicine

## 2013-01-21 ENCOUNTER — Encounter: Payer: Self-pay | Admitting: *Deleted

## 2013-01-21 NOTE — Progress Notes (Signed)
Quick Note:  Pt aware of results and that I have released to MyChart. Pt will email me once she receives the results via MyChart. ______

## 2013-01-23 ENCOUNTER — Other Ambulatory Visit: Payer: Self-pay | Admitting: Internal Medicine

## 2013-02-04 DIAGNOSIS — Z79899 Other long term (current) drug therapy: Secondary | ICD-10-CM | POA: Diagnosis not present

## 2013-02-04 DIAGNOSIS — M47817 Spondylosis without myelopathy or radiculopathy, lumbosacral region: Secondary | ICD-10-CM | POA: Diagnosis not present

## 2013-02-04 DIAGNOSIS — M159 Polyosteoarthritis, unspecified: Secondary | ICD-10-CM | POA: Diagnosis not present

## 2013-02-04 DIAGNOSIS — M25559 Pain in unspecified hip: Secondary | ICD-10-CM | POA: Diagnosis not present

## 2013-02-06 ENCOUNTER — Other Ambulatory Visit: Payer: Self-pay | Admitting: Internal Medicine

## 2013-02-06 NOTE — Telephone Encounter (Signed)
Losartan refilled per protocol. JG//CMA 

## 2013-02-08 DIAGNOSIS — Z87891 Personal history of nicotine dependence: Secondary | ICD-10-CM | POA: Insufficient documentation

## 2013-02-08 NOTE — Assessment & Plan Note (Signed)
She is satisfied to continue allergy vaccine. We discussed risk benefit and goals. Plan-refill EpiPen

## 2013-02-08 NOTE — Assessment & Plan Note (Signed)
Plan-chest x-ray. Refill doxycycline to hold this winter

## 2013-02-11 DIAGNOSIS — M810 Age-related osteoporosis without current pathological fracture: Secondary | ICD-10-CM | POA: Diagnosis not present

## 2013-02-11 DIAGNOSIS — M48061 Spinal stenosis, lumbar region without neurogenic claudication: Secondary | ICD-10-CM | POA: Diagnosis not present

## 2013-02-11 DIAGNOSIS — E559 Vitamin D deficiency, unspecified: Secondary | ICD-10-CM | POA: Diagnosis not present

## 2013-02-20 ENCOUNTER — Encounter: Payer: Self-pay | Admitting: Internal Medicine

## 2013-03-03 ENCOUNTER — Other Ambulatory Visit: Payer: Self-pay | Admitting: Internal Medicine

## 2013-03-03 NOTE — Telephone Encounter (Signed)
Celebrex refilled per protocol. JG//CMA

## 2013-03-04 ENCOUNTER — Encounter: Payer: Self-pay | Admitting: Internal Medicine

## 2013-03-11 DIAGNOSIS — M159 Polyosteoarthritis, unspecified: Secondary | ICD-10-CM | POA: Diagnosis not present

## 2013-03-11 DIAGNOSIS — M47817 Spondylosis without myelopathy or radiculopathy, lumbosacral region: Secondary | ICD-10-CM | POA: Diagnosis not present

## 2013-03-11 DIAGNOSIS — Z79899 Other long term (current) drug therapy: Secondary | ICD-10-CM | POA: Diagnosis not present

## 2013-03-16 ENCOUNTER — Other Ambulatory Visit: Payer: Self-pay | Admitting: Internal Medicine

## 2013-04-02 ENCOUNTER — Other Ambulatory Visit: Payer: Self-pay | Admitting: Internal Medicine

## 2013-04-16 DIAGNOSIS — S8000XA Contusion of unspecified knee, initial encounter: Secondary | ICD-10-CM | POA: Diagnosis not present

## 2013-04-16 DIAGNOSIS — S93609A Unspecified sprain of unspecified foot, initial encounter: Secondary | ICD-10-CM | POA: Diagnosis not present

## 2013-04-20 ENCOUNTER — Other Ambulatory Visit: Payer: Self-pay | Admitting: Internal Medicine

## 2013-04-20 NOTE — Telephone Encounter (Signed)
CY, Please advise if okay to refill. Thanks.  

## 2013-04-22 DIAGNOSIS — Z79899 Other long term (current) drug therapy: Secondary | ICD-10-CM | POA: Diagnosis not present

## 2013-04-22 DIAGNOSIS — G894 Chronic pain syndrome: Secondary | ICD-10-CM | POA: Diagnosis not present

## 2013-04-23 ENCOUNTER — Ambulatory Visit (INDEPENDENT_AMBULATORY_CARE_PROVIDER_SITE_OTHER): Payer: Medicare Other

## 2013-04-23 ENCOUNTER — Other Ambulatory Visit: Payer: Self-pay | Admitting: *Deleted

## 2013-04-23 DIAGNOSIS — J309 Allergic rhinitis, unspecified: Secondary | ICD-10-CM

## 2013-04-23 MED ORDER — LORAZEPAM 1 MG PO TABS: ORAL_TABLET | ORAL | Status: DC

## 2013-04-23 NOTE — Telephone Encounter (Signed)
Pt notified rx called into pharm

## 2013-04-28 DIAGNOSIS — H04129 Dry eye syndrome of unspecified lacrimal gland: Secondary | ICD-10-CM | POA: Diagnosis not present

## 2013-04-28 DIAGNOSIS — H2510 Age-related nuclear cataract, unspecified eye: Secondary | ICD-10-CM | POA: Diagnosis not present

## 2013-05-05 ENCOUNTER — Other Ambulatory Visit: Payer: Self-pay | Admitting: Internal Medicine

## 2013-05-06 ENCOUNTER — Encounter: Payer: Self-pay | Admitting: Internal Medicine

## 2013-05-06 ENCOUNTER — Ambulatory Visit (INDEPENDENT_AMBULATORY_CARE_PROVIDER_SITE_OTHER): Payer: Medicare Other | Admitting: Internal Medicine

## 2013-05-06 VITALS — BP 110/80 | HR 83 | Temp 98.3°F | Resp 14 | Wt 149.0 lb

## 2013-05-06 DIAGNOSIS — K219 Gastro-esophageal reflux disease without esophagitis: Secondary | ICD-10-CM | POA: Diagnosis not present

## 2013-05-06 MED ORDER — ESOMEPRAZOLE MAGNESIUM 40 MG PO CPDR: 40.0000 mg | DELAYED_RELEASE_CAPSULE | Freq: Every day | ORAL | Status: DC

## 2013-05-06 NOTE — Progress Notes (Signed)
   Subjective:    Patient ID: Hailey [name], female    DOB: April 23, 1940, 73 y.o.   MRN: [id number]  HPI  Her drug coverage plan will no longer cover AcipHex 20 mg daily which she has taken for her reflux with good success. The options include Nexium, omeprazole, and pantoprazole.  She is under the impression that she may have developed emphysema from reflux and is anxious to continue the reflux medication  Her x-rays were reviewed; in December 2014 she had some subsegmental atelectasis in the right lower lobe. There was hyperlucency in the upper lobes but there also was some rotation.  She has not had pulmonary function tests performed  She did smoke  for 8 years, up to 3 packs per day. Most of the time  consumption was at the level of 2 packs a day.     Review of Systems   She has no cough or sputum production. She does not describe exertional dyspnea but has restricted her activities at the advice of her back surgeon. She does plan to initiate a swimming program in the near future.   She denies  melena, or rectal bleeding. Shehas no unexplained weight loss, dysphagia, or abdominal pain.     Objective:   Physical Exam   She appears healthy and well-nourished in no distress  She appears younger than stated age  She has an S4 without significant murmur or gallop  Chest is clear with no rhonchi, rales, wheezes  She may have minimal clubbing of the nailbeds.  She has no pedal edema  Pedal pulses are intact        Assessment & Plan:  #1 symptomatic reflux; Nexium would be her best formulary  option at this time  #2 "radiographic emphysema". Pulmonary function test would probably not help define the issue because of the extensive hardware in her spine which may cause restrictive pattern. It could be performed if she has difficulty increasing her swimming program due to exertional dyspnea

## 2013-05-06 NOTE — Patient Instructions (Signed)
Reflux of gastric acid may be asymptomatic as this may occur mainly during sleep.The triggers for reflux  include stress; the "aspirin family" ; alcohol; peppermint; and caffeine (coffee, tea, cola, and chocolate). The aspirin family would include aspirin and the nonsteroidal agents such as ibuprofen &  Naproxen. Tylenol would not cause reflux. If having symptoms ; food & drink should be avoided for @ least 2 hours before going to bed.  

## 2013-05-06 NOTE — Progress Notes (Signed)
Pre visit review using our clinic review tool, if applicable. No additional management support is needed unless otherwise documented below in the visit note. 

## 2013-05-11 ENCOUNTER — Other Ambulatory Visit: Payer: Self-pay | Admitting: Internal Medicine

## 2013-05-19 ENCOUNTER — Other Ambulatory Visit: Payer: Self-pay | Admitting: Internal Medicine

## 2013-05-20 NOTE — Telephone Encounter (Signed)
Ok to refill 

## 2013-05-20 NOTE — Telephone Encounter (Signed)
CY, Please advise if okay to refill. Thanks.  

## 2013-05-22 NOTE — Telephone Encounter (Signed)
Called refill to pharmacy voicemail.  

## 2013-06-03 DIAGNOSIS — Z79899 Other long term (current) drug therapy: Secondary | ICD-10-CM | POA: Diagnosis not present

## 2013-06-03 DIAGNOSIS — G894 Chronic pain syndrome: Secondary | ICD-10-CM | POA: Diagnosis not present

## 2013-06-04 ENCOUNTER — Other Ambulatory Visit: Payer: Self-pay | Admitting: Internal Medicine

## 2013-06-08 ENCOUNTER — Other Ambulatory Visit: Payer: Self-pay | Admitting: Internal Medicine

## 2013-06-08 NOTE — Telephone Encounter (Signed)
OK X1 

## 2013-06-08 NOTE — Telephone Encounter (Signed)
Last ov 05/06/13 Med last filled 05/06/13 #30

## 2013-06-26 ENCOUNTER — Other Ambulatory Visit: Payer: Self-pay | Admitting: Internal Medicine

## 2013-06-26 MED ORDER — ESZOPICLONE 2 MG PO TABS: ORAL_TABLET | ORAL | Status: DC

## 2013-06-26 MED ORDER — LORAZEPAM 1 MG PO TABS: ORAL_TABLET | ORAL | Status: DC

## 2013-06-26 NOTE — Telephone Encounter (Signed)
Rx's called to [facility].

## 2013-07-02 DIAGNOSIS — I781 Nevus, non-neoplastic: Secondary | ICD-10-CM | POA: Diagnosis not present

## 2013-07-02 DIAGNOSIS — L821 Other seborrheic keratosis: Secondary | ICD-10-CM | POA: Diagnosis not present

## 2013-07-02 DIAGNOSIS — L608 Other nail disorders: Secondary | ICD-10-CM | POA: Diagnosis not present

## 2013-07-02 DIAGNOSIS — D239 Other benign neoplasm of skin, unspecified: Secondary | ICD-10-CM | POA: Diagnosis not present

## 2013-07-10 ENCOUNTER — Other Ambulatory Visit: Payer: Self-pay | Admitting: Internal Medicine

## 2013-07-10 NOTE — Telephone Encounter (Signed)
Lexapro OK with 2 refills Celebrex X 1

## 2013-07-10 NOTE — Telephone Encounter (Signed)
Last office visit 05-06-13

## 2013-07-14 DIAGNOSIS — M545 Low back pain, unspecified: Secondary | ICD-10-CM | POA: Diagnosis not present

## 2013-07-14 DIAGNOSIS — M79609 Pain in unspecified limb: Secondary | ICD-10-CM | POA: Diagnosis not present

## 2013-07-14 DIAGNOSIS — Z981 Arthrodesis status: Secondary | ICD-10-CM | POA: Diagnosis not present

## 2013-08-03 DIAGNOSIS — M810 Age-related osteoporosis without current pathological fracture: Secondary | ICD-10-CM | POA: Diagnosis not present

## 2013-08-03 DIAGNOSIS — R5381 Other malaise: Secondary | ICD-10-CM | POA: Diagnosis not present

## 2013-08-03 DIAGNOSIS — E559 Vitamin D deficiency, unspecified: Secondary | ICD-10-CM | POA: Diagnosis not present

## 2013-08-16 ENCOUNTER — Other Ambulatory Visit: Payer: Self-pay | Admitting: Internal Medicine

## 2013-08-21 DIAGNOSIS — M48061 Spinal stenosis, lumbar region without neurogenic claudication: Secondary | ICD-10-CM | POA: Diagnosis not present

## 2013-08-21 DIAGNOSIS — E559 Vitamin D deficiency, unspecified: Secondary | ICD-10-CM | POA: Diagnosis not present

## 2013-08-21 DIAGNOSIS — M810 Age-related osteoporosis without current pathological fracture: Secondary | ICD-10-CM | POA: Diagnosis not present

## 2013-09-14 ENCOUNTER — Ambulatory Visit (INDEPENDENT_AMBULATORY_CARE_PROVIDER_SITE_OTHER): Payer: Medicare Other

## 2013-09-14 DIAGNOSIS — J309 Allergic rhinitis, unspecified: Secondary | ICD-10-CM

## 2013-10-01 DIAGNOSIS — Z0000 Encounter for general adult medical examination without abnormal findings: Secondary | ICD-10-CM | POA: Diagnosis not present

## 2013-10-01 DIAGNOSIS — Z124 Encounter for screening for malignant neoplasm of cervix: Secondary | ICD-10-CM | POA: Diagnosis not present

## 2013-10-01 DIAGNOSIS — Z87440 Personal history of urinary (tract) infections: Secondary | ICD-10-CM | POA: Diagnosis not present

## 2013-10-01 DIAGNOSIS — Z01419 Encounter for gynecological examination (general) (routine) without abnormal findings: Secondary | ICD-10-CM | POA: Diagnosis not present

## 2013-10-02 DIAGNOSIS — Z124 Encounter for screening for malignant neoplasm of cervix: Secondary | ICD-10-CM | POA: Diagnosis not present

## 2013-10-07 DIAGNOSIS — Z87891 Personal history of nicotine dependence: Secondary | ICD-10-CM | POA: Diagnosis not present

## 2013-10-07 DIAGNOSIS — M25519 Pain in unspecified shoulder: Secondary | ICD-10-CM | POA: Diagnosis not present

## 2013-10-07 DIAGNOSIS — M545 Low back pain, unspecified: Secondary | ICD-10-CM | POA: Diagnosis not present

## 2013-10-07 DIAGNOSIS — R209 Unspecified disturbances of skin sensation: Secondary | ICD-10-CM | POA: Diagnosis not present

## 2013-10-07 DIAGNOSIS — M4000 Postural kyphosis, site unspecified: Secondary | ICD-10-CM | POA: Diagnosis not present

## 2013-10-07 DIAGNOSIS — Z981 Arthrodesis status: Secondary | ICD-10-CM | POA: Diagnosis not present

## 2013-10-07 DIAGNOSIS — Z4789 Encounter for other orthopedic aftercare: Secondary | ICD-10-CM | POA: Diagnosis not present

## 2013-10-17 ENCOUNTER — Other Ambulatory Visit: Payer: Self-pay | Admitting: Internal Medicine

## 2013-10-20 NOTE — Telephone Encounter (Signed)
OK X1, R X 2 

## 2013-10-30 DIAGNOSIS — M25519 Pain in unspecified shoulder: Secondary | ICD-10-CM | POA: Diagnosis not present

## 2013-10-30 DIAGNOSIS — S43429A Sprain of unspecified rotator cuff capsule, initial encounter: Secondary | ICD-10-CM | POA: Diagnosis not present

## 2013-11-03 DIAGNOSIS — Z23 Encounter for immunization: Secondary | ICD-10-CM | POA: Diagnosis not present

## 2013-11-04 ENCOUNTER — Ambulatory Visit (INDEPENDENT_AMBULATORY_CARE_PROVIDER_SITE_OTHER): Payer: Medicare Other

## 2013-11-04 DIAGNOSIS — J309 Allergic rhinitis, unspecified: Secondary | ICD-10-CM | POA: Diagnosis not present

## 2013-11-06 DIAGNOSIS — M19019 Primary osteoarthritis, unspecified shoulder: Secondary | ICD-10-CM | POA: Diagnosis not present

## 2013-11-11 DIAGNOSIS — S43429A Sprain of unspecified rotator cuff capsule, initial encounter: Secondary | ICD-10-CM | POA: Diagnosis not present

## 2013-11-11 DIAGNOSIS — M19019 Primary osteoarthritis, unspecified shoulder: Secondary | ICD-10-CM | POA: Diagnosis not present

## 2013-11-15 ENCOUNTER — Other Ambulatory Visit: Payer: Self-pay | Admitting: Internal Medicine

## 2013-11-17 ENCOUNTER — Emergency Department (HOSPITAL_COMMUNITY): Payer: Medicare Other

## 2013-11-17 ENCOUNTER — Observation Stay (HOSPITAL_COMMUNITY)
Admission: EM | Admit: 2013-11-17 | Discharge: 2013-11-19 | Disposition: A | Discharge status: Home / Self Care | Payer: Medicare Other | Attending: Internal Medicine | Admitting: Internal Medicine

## 2013-11-17 ENCOUNTER — Encounter (HOSPITAL_COMMUNITY): Payer: Self-pay | Admitting: Emergency Medicine

## 2013-11-17 DIAGNOSIS — Z7982 Long term (current) use of aspirin: Secondary | ICD-10-CM | POA: Insufficient documentation

## 2013-11-17 DIAGNOSIS — S2242XA Multiple fractures of ribs, left side, initial encounter for closed fracture: Secondary | ICD-10-CM | POA: Diagnosis not present

## 2013-11-17 DIAGNOSIS — R55 Syncope and collapse: Secondary | ICD-10-CM | POA: Diagnosis present

## 2013-11-17 DIAGNOSIS — S2232XA Fracture of one rib, left side, initial encounter for closed fracture: Secondary | ICD-10-CM | POA: Diagnosis not present

## 2013-11-17 DIAGNOSIS — M810 Age-related osteoporosis without current pathological fracture: Secondary | ICD-10-CM | POA: Diagnosis not present

## 2013-11-17 DIAGNOSIS — Y92481 Parking lot as the place of occurrence of the external cause: Secondary | ICD-10-CM | POA: Insufficient documentation

## 2013-11-17 DIAGNOSIS — E860 Dehydration: Secondary | ICD-10-CM | POA: Diagnosis not present

## 2013-11-17 DIAGNOSIS — R0902 Hypoxemia: Secondary | ICD-10-CM

## 2013-11-17 DIAGNOSIS — G4733 Obstructive sleep apnea (adult) (pediatric): Secondary | ICD-10-CM | POA: Insufficient documentation

## 2013-11-17 DIAGNOSIS — S0990XA Unspecified injury of head, initial encounter: Secondary | ICD-10-CM | POA: Diagnosis not present

## 2013-11-17 DIAGNOSIS — W19XXXA Unspecified fall, initial encounter: Secondary | ICD-10-CM | POA: Insufficient documentation

## 2013-11-17 DIAGNOSIS — R0781 Pleurodynia: Secondary | ICD-10-CM | POA: Diagnosis not present

## 2013-11-17 DIAGNOSIS — N179 Acute kidney failure, unspecified: Secondary | ICD-10-CM | POA: Insufficient documentation

## 2013-11-17 DIAGNOSIS — S0181XA Laceration without foreign body of other part of head, initial encounter: Secondary | ICD-10-CM | POA: Diagnosis not present

## 2013-11-17 DIAGNOSIS — S01112A Laceration without foreign body of left eyelid and periocular area, initial encounter: Secondary | ICD-10-CM | POA: Diagnosis not present

## 2013-11-17 DIAGNOSIS — Y908 Blood alcohol level of 240 mg/100 ml or more: Secondary | ICD-10-CM | POA: Diagnosis not present

## 2013-11-17 DIAGNOSIS — S0190XA Unspecified open wound of unspecified part of head, initial encounter: Secondary | ICD-10-CM | POA: Diagnosis not present

## 2013-11-17 DIAGNOSIS — I959 Hypotension, unspecified: Secondary | ICD-10-CM | POA: Diagnosis not present

## 2013-11-17 DIAGNOSIS — E785 Hyperlipidemia, unspecified: Secondary | ICD-10-CM | POA: Diagnosis not present

## 2013-11-17 DIAGNOSIS — S2231XS Fracture of one rib, right side, sequela: Secondary | ICD-10-CM

## 2013-11-17 DIAGNOSIS — Z87891 Personal history of nicotine dependence: Secondary | ICD-10-CM | POA: Insufficient documentation

## 2013-11-17 DIAGNOSIS — S299XXA Unspecified injury of thorax, initial encounter: Secondary | ICD-10-CM | POA: Diagnosis not present

## 2013-11-17 DIAGNOSIS — E871 Hypo-osmolality and hyponatremia: Secondary | ICD-10-CM

## 2013-11-17 DIAGNOSIS — N289 Disorder of kidney and ureter, unspecified: Secondary | ICD-10-CM

## 2013-11-17 DIAGNOSIS — S2239XA Fracture of one rib, unspecified side, initial encounter for closed fracture: Secondary | ICD-10-CM | POA: Diagnosis present

## 2013-11-17 DIAGNOSIS — Z79899 Other long term (current) drug therapy: Secondary | ICD-10-CM | POA: Insufficient documentation

## 2013-11-17 DIAGNOSIS — M1990 Unspecified osteoarthritis, unspecified site: Secondary | ICD-10-CM | POA: Insufficient documentation

## 2013-11-17 DIAGNOSIS — F10120 Alcohol abuse with intoxication, uncomplicated: Secondary | ICD-10-CM | POA: Diagnosis not present

## 2013-11-17 DIAGNOSIS — J9811 Atelectasis: Secondary | ICD-10-CM | POA: Insufficient documentation

## 2013-11-17 DIAGNOSIS — I10 Essential (primary) hypertension: Secondary | ICD-10-CM

## 2013-11-17 DIAGNOSIS — F10129 Alcohol abuse with intoxication, unspecified: Secondary | ICD-10-CM

## 2013-11-17 DIAGNOSIS — K219 Gastro-esophageal reflux disease without esophagitis: Secondary | ICD-10-CM | POA: Diagnosis not present

## 2013-11-17 DIAGNOSIS — S2232XD Fracture of one rib, left side, subsequent encounter for fracture with routine healing: Secondary | ICD-10-CM

## 2013-11-17 DIAGNOSIS — F10920 Alcohol use, unspecified with intoxication, uncomplicated: Secondary | ICD-10-CM

## 2013-11-17 DIAGNOSIS — R030 Elevated blood-pressure reading, without diagnosis of hypertension: Secondary | ICD-10-CM | POA: Diagnosis not present

## 2013-11-17 DIAGNOSIS — R220 Localized swelling, mass and lump, head: Secondary | ICD-10-CM | POA: Diagnosis not present

## 2013-11-17 LAB — CBC WITH DIFFERENTIAL/PLATELET
Basophils Absolute: 0 10*3/uL (ref 0.0–0.1)
Basophils Relative: 0 % (ref 0–1)
EOS PCT: 1 % (ref 0–5)
Eosinophils Absolute: 0 10*3/uL (ref 0.0–0.7)
HEMATOCRIT: 33.2 % — AB (ref 36.0–46.0)
Hemoglobin: 11 g/dL — ABNORMAL LOW (ref 12.0–15.0)
LYMPHS ABS: 2 10*3/uL (ref 0.7–4.0)
LYMPHS PCT: 33 % (ref 12–46)
MCH: 31.2 pg (ref 26.0–34.0)
MCHC: 33.1 g/dL (ref 30.0–36.0)
MCV: 94.1 fL (ref 78.0–100.0)
Monocytes Absolute: 0.6 10*3/uL (ref 0.1–1.0)
Monocytes Relative: 10 % (ref 3–12)
Neutro Abs: 3.5 10*3/uL (ref 1.7–7.7)
Neutrophils Relative %: 56 % (ref 43–77)
PLATELETS: 217 10*3/uL (ref 150–400)
RBC: 3.53 MIL/uL — AB (ref 3.87–5.11)
RDW: 14.5 % (ref 11.5–15.5)
WBC: 6.2 10*3/uL (ref 4.0–10.5)

## 2013-11-17 LAB — I-STAT CHEM 8, ED
BUN: 19 mg/dL (ref 6–23)
CHLORIDE: 96 meq/L (ref 96–112)
CREATININE: 1.2 mg/dL — AB (ref 0.50–1.10)
Calcium, Ion: 1.13 mmol/L (ref 1.13–1.30)
Glucose, Bld: 143 mg/dL — ABNORMAL HIGH (ref 70–99)
HCT: 35 % — ABNORMAL LOW (ref 36.0–46.0)
Hemoglobin: 11.9 g/dL — ABNORMAL LOW (ref 12.0–15.0)
Potassium: 4.2 mEq/L (ref 3.7–5.3)
SODIUM: 130 meq/L — AB (ref 137–147)
TCO2: 21 mmol/L (ref 0–100)

## 2013-11-17 LAB — ETHANOL: Alcohol, Ethyl (B): 287 mg/dL — ABNORMAL HIGH (ref 0–11)

## 2013-11-17 MED ORDER — SODIUM CHLORIDE 0.9 % IV BOLUS (SEPSIS)
1000.0000 mL | Freq: Once | INTRAVENOUS | Status: AC
  Administered 2013-11-17: 1000 mL via INTRAVENOUS

## 2013-11-17 MED ORDER — LIDOCAINE HCL (PF) 1 % IJ SOLN
5.0000 mL | Freq: Once | INTRAMUSCULAR | Status: AC
  Administered 2013-11-17: 5 mL
  Filled 2013-11-17: qty 5
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
## 2013-11-17 NOTE — ED Notes (Signed)
Pt returned from CT °

## 2013-11-17 NOTE — ED Notes (Signed)
Pt to xray at this time.

## 2013-11-18 DIAGNOSIS — S2231XS Fracture of one rib, right side, sequela: Secondary | ICD-10-CM

## 2013-11-18 DIAGNOSIS — R0902 Hypoxemia: Secondary | ICD-10-CM | POA: Diagnosis present

## 2013-11-18 DIAGNOSIS — S2239XA Fracture of one rib, unspecified side, initial encounter for closed fracture: Secondary | ICD-10-CM | POA: Diagnosis present

## 2013-11-18 DIAGNOSIS — E871 Hypo-osmolality and hyponatremia: Secondary | ICD-10-CM

## 2013-11-18 DIAGNOSIS — F10129 Alcohol abuse with intoxication, unspecified: Secondary | ICD-10-CM | POA: Diagnosis present

## 2013-11-18 DIAGNOSIS — R55 Syncope and collapse: Secondary | ICD-10-CM | POA: Diagnosis not present

## 2013-11-18 DIAGNOSIS — I959 Hypotension, unspecified: Secondary | ICD-10-CM | POA: Diagnosis present

## 2013-11-18 DIAGNOSIS — I10 Essential (primary) hypertension: Secondary | ICD-10-CM

## 2013-11-18 DIAGNOSIS — K219 Gastro-esophageal reflux disease without esophagitis: Secondary | ICD-10-CM

## 2013-11-18 DIAGNOSIS — E860 Dehydration: Secondary | ICD-10-CM | POA: Diagnosis present

## 2013-11-18 DIAGNOSIS — S2232XA Fracture of one rib, left side, initial encounter for closed fracture: Secondary | ICD-10-CM

## 2013-11-18 LAB — COMPREHENSIVE METABOLIC PANEL
ALBUMIN: 4 g/dL (ref 3.5–5.2)
ALT: 11 U/L (ref 0–35)
ANION GAP: 12 (ref 5–15)
AST: 16 U/L (ref 0–37)
Alkaline Phosphatase: 40 U/L (ref 39–117)
BILIRUBIN TOTAL: 0.2 mg/dL — AB (ref 0.3–1.2)
BUN: 14 mg/dL (ref 6–23)
CHLORIDE: 100 meq/L (ref 96–112)
CO2: 24 mEq/L (ref 19–32)
Calcium: 8.5 mg/dL (ref 8.4–10.5)
Creatinine, Ser: 0.56 mg/dL (ref 0.50–1.10)
GFR calc Af Amer: >90 mL/min (ref >90)
GFR calc non Af Amer: >90 mL/min (ref >90)
GLUCOSE: 102 mg/dL — AB (ref 70–99)
POTASSIUM: 4.4 meq/L (ref 3.7–5.3)
SODIUM: 136 meq/L — AB (ref 137–147)
TOTAL PROTEIN: 6.5 g/dL (ref 6.0–8.3)

## 2013-11-18 LAB — CBC
HCT: 34.2 % — ABNORMAL LOW (ref 36.0–46.0)
HEMOGLOBIN: 11.6 g/dL — AB (ref 12.0–15.0)
MCH: 31 pg (ref 26.0–34.0)
MCHC: 33.9 g/dL (ref 30.0–36.0)
MCV: 91.4 fL (ref 78.0–100.0)
Platelets: 215 10*3/uL (ref 150–400)
RBC: 3.74 MIL/uL — ABNORMAL LOW (ref 3.87–5.11)
RDW: 14.6 % (ref 11.5–15.5)
WBC: 7.6 10*3/uL (ref 4.0–10.5)

## 2013-11-18 LAB — PROTIME-INR
INR: 0.99 (ref 0.00–1.49)
Prothrombin Time: 13.1 seconds (ref 11.6–15.2)

## 2013-11-18 MED ORDER — ACETAMINOPHEN 650 MG RE SUPP: 650.0000 mg | Freq: Four times a day (QID) | RECTAL | Status: DC | PRN

## 2013-11-18 MED ORDER — ASPIRIN EC 81 MG PO TBEC
81.0000 mg | DELAYED_RELEASE_TABLET | Freq: Every day | ORAL | Status: DC
  Administered 2013-11-18 – 2013-11-19 (×2): 81 mg via ORAL
  Filled 2013-11-18 (×2): qty 1

## 2013-11-18 MED ORDER — KETOROLAC TROMETHAMINE 15 MG/ML IJ SOLN: 15.0000 mg | Freq: Four times a day (QID) | INTRAMUSCULAR | Status: DC | PRN

## 2013-11-18 MED ORDER — ATORVASTATIN CALCIUM 40 MG PO TABS
40.0000 mg | ORAL_TABLET | Freq: Every day | ORAL | Status: DC
  Administered 2013-11-18 – 2013-11-19 (×2): 40 mg via ORAL
  Filled 2013-11-18 (×2): qty 1

## 2013-11-18 MED ORDER — ESCITALOPRAM OXALATE 20 MG PO TABS
20.0000 mg | ORAL_TABLET | Freq: Every day | ORAL | Status: DC
  Administered 2013-11-18 – 2013-11-19 (×2): 20 mg via ORAL
  Filled 2013-11-18 (×2): qty 1

## 2013-11-18 MED ORDER — KETOROLAC TROMETHAMINE 15 MG/ML IJ SOLN
15.0000 mg | Freq: Four times a day (QID) | INTRAMUSCULAR | Status: AC
  Administered 2013-11-18 – 2013-11-19 (×4): 15 mg via INTRAVENOUS
  Filled 2013-11-18 (×4): qty 1

## 2013-11-18 MED ORDER — SENNA 8.6 MG PO TABS
1.0000 | ORAL_TABLET | Freq: Every day | ORAL | Status: DC
  Administered 2013-11-18 – 2013-11-19 (×2): 8.6 mg via ORAL
  Filled 2013-11-18 (×2): qty 1

## 2013-11-18 MED ORDER — ACETAMINOPHEN 325 MG PO TABS
650.0000 mg | ORAL_TABLET | Freq: Four times a day (QID) | ORAL | Status: DC | PRN
  Administered 2013-11-18: 650 mg via ORAL
  Filled 2013-11-18: qty 2

## 2013-11-18 MED ORDER — ONDANSETRON HCL 4 MG/2ML IJ SOLN: 4.0000 mg | Freq: Four times a day (QID) | INTRAMUSCULAR | Status: DC | PRN

## 2013-11-18 MED ORDER — METHOCARBAMOL 500 MG PO TABS
500.0000 mg | ORAL_TABLET | Freq: Three times a day (TID) | ORAL | Status: DC | PRN
  Filled 2013-11-18: qty 1

## 2013-11-18 MED ORDER — SODIUM CHLORIDE 0.9 % IJ SOLN
3.0000 mL | Freq: Two times a day (BID) | INTRAMUSCULAR | Status: DC
  Administered 2013-11-18 – 2013-11-19 (×3): 3 mL via INTRAVENOUS

## 2013-11-18 MED ORDER — PANTOPRAZOLE SODIUM 40 MG PO TBEC
40.0000 mg | DELAYED_RELEASE_TABLET | Freq: Every day | ORAL | Status: DC
  Administered 2013-11-18 – 2013-11-19 (×2): 40 mg via ORAL
  Filled 2013-11-18 (×2): qty 1

## 2013-11-18 MED ORDER — LORAZEPAM 1 MG PO TABS
1.0000 mg | ORAL_TABLET | Freq: Every evening | ORAL | Status: DC | PRN
  Administered 2013-11-19: 1 mg via ORAL
  Filled 2013-11-18: qty 1

## 2013-11-18 MED ORDER — THIAMINE HCL 100 MG/ML IJ SOLN
Freq: Once | INTRAVENOUS | Status: AC
  Administered 2013-11-18: 04:00:00 via INTRAVENOUS
  Filled 2013-11-18: qty 1000

## 2013-11-18 MED ORDER — HYDROMORPHONE HCL 1 MG/ML IJ SOLN
0.5000 mg | Freq: Once | INTRAMUSCULAR | Status: AC
  Administered 2013-11-18: 0.5 mg via INTRAVENOUS
  Filled 2013-11-18: qty 1

## 2013-11-18 MED ORDER — SODIUM CHLORIDE 0.9 % IV SOLN
INTRAVENOUS | Status: DC
  Administered 2013-11-18: 100 mL/h via INTRAVENOUS
  Administered 2013-11-19: 05:00:00 via INTRAVENOUS

## 2013-11-18 MED ORDER — OXYCODONE HCL 5 MG PO TABS: 5.0000 mg | ORAL_TABLET | Freq: Four times a day (QID) | ORAL | Status: DC | PRN

## 2013-11-18 MED ORDER — LIDOCAINE 5 % EX PTCH
2.0000 | MEDICATED_PATCH | CUTANEOUS | Status: DC
  Administered 2013-11-18: 2 via TRANSDERMAL
  Filled 2013-11-18 (×2): qty 2

## 2013-11-18 MED ORDER — ONDANSETRON HCL 4 MG PO TABS: 4.0000 mg | ORAL_TABLET | Freq: Four times a day (QID) | ORAL | Status: DC | PRN

## 2013-11-18 MED ORDER — PROMETHAZINE HCL 25 MG PO TABS: 25.0000 mg | ORAL_TABLET | ORAL | Status: DC | PRN

## 2013-11-18 MED ORDER — ONDANSETRON HCL 4 MG/2ML IJ SOLN
4.0000 mg | Freq: Once | INTRAMUSCULAR | Status: AC
  Administered 2013-11-18: 4 mg via INTRAVENOUS
  Filled 2013-11-18: qty 2

## 2013-11-18 MED ORDER — OXYCODONE HCL 5 MG PO TABS
10.0000 mg | ORAL_TABLET | Freq: Four times a day (QID) | ORAL | Status: DC | PRN
  Administered 2013-11-18 – 2013-11-19 (×3): 10 mg via ORAL
  Filled 2013-11-18 (×3): qty 2

## 2013-11-18 MED ORDER — LIDOCAINE 5 % EX PTCH
1.0000 | MEDICATED_PATCH | CUTANEOUS | Status: DC
  Administered 2013-11-18 – 2013-11-19 (×2): 1 via TRANSDERMAL
  Filled 2013-11-18 (×3): qty 1

## 2013-11-18 MED ORDER — HEPARIN SODIUM (PORCINE) 5000 UNIT/ML IJ SOLN
5000.0000 [IU] | Freq: Three times a day (TID) | INTRAMUSCULAR | Status: DC
  Administered 2013-11-18 – 2013-11-19 (×4): 5000 [IU] via SUBCUTANEOUS
  Filled 2013-11-18 (×6): qty 1
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
## 2013-11-18 NOTE — Progress Notes (Signed)
PT Cancellation Note  Patient Details Name: LAVONA NORSWORTHY MRN: [id number] DOB: 1940-03-27   Cancelled Treatment:    Reason Eval/Treat Not Completed: Pain limiting ability to participate. Pt had just received pain medication when PT arrived. Will check back as schedule allows to complete PT eval.    Rolinda Roan 11/18/2013, 1:18 PM  Rolinda Roan, PT, DPT Acute Rehabilitation Services Pager: [phone]
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
## 2013-11-18 NOTE — Progress Notes (Signed)
UR completed 

## 2013-11-18 NOTE — ED Notes (Signed)
Dr. Patel at bedside 
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
## 2013-11-19 DIAGNOSIS — R55 Syncope and collapse: Secondary | ICD-10-CM | POA: Diagnosis not present

## 2013-11-19 DIAGNOSIS — S2232XD Fracture of one rib, left side, subsequent encounter for fracture with routine healing: Secondary | ICD-10-CM

## 2013-11-19 DIAGNOSIS — E860 Dehydration: Secondary | ICD-10-CM

## 2013-11-19 LAB — BASIC METABOLIC PANEL
Anion gap: 8 (ref 5–15)
BUN: 8 mg/dL (ref 6–23)
CALCIUM: 8.3 mg/dL — AB (ref 8.4–10.5)
CHLORIDE: 101 meq/L (ref 96–112)
CO2: 29 meq/L (ref 19–32)
CREATININE: 0.67 mg/dL (ref 0.50–1.10)
GFR calc Af Amer: >90 mL/min (ref >90)
GFR calc non Af Amer: 85 mL/min — ABNORMAL LOW (ref >90)
Glucose, Bld: 102 mg/dL — ABNORMAL HIGH (ref 70–99)
Potassium: 4.8 mEq/L (ref 3.7–5.3)
Sodium: 138 mEq/L (ref 137–147)

## 2013-11-19 LAB — URINALYSIS, ROUTINE W REFLEX MICROSCOPIC
BILIRUBIN URINE: NEGATIVE
Glucose, UA: NEGATIVE mg/dL
Hgb urine dipstick: NEGATIVE
Ketones, ur: NEGATIVE mg/dL
Leukocytes, UA: NEGATIVE
NITRITE: NEGATIVE
PH: 7 (ref 5.0–8.0)
Protein, ur: NEGATIVE mg/dL
Specific Gravity, Urine: 1.008 (ref 1.005–1.030)
Urobilinogen, UA: 0.2 mg/dL (ref 0.0–1.0)

## 2013-11-19 MED ORDER — NAPROXEN 250 MG PO TABS: 250.0000 mg | ORAL_TABLET | Freq: Three times a day (TID) | ORAL | Status: DC

## 2013-11-19 MED ORDER — SENNA 8.6 MG PO TABS: 2.0000 | ORAL_TABLET | Freq: Every day | ORAL | Status: DC

## 2013-11-19 MED ORDER — OXYCODONE HCL 10 MG PO TABS: 10.0000 mg | ORAL_TABLET | Freq: Four times a day (QID) | ORAL | Status: DC | PRN

## 2013-11-19 MED ORDER — LIDOCAINE 5 % EX PTCH: 1.0000 | MEDICATED_PATCH | CUTANEOUS | Status: DC
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
## 2013-11-19 NOTE — Discharge Instructions (Signed)
Alcohol Intoxication  Alcohol intoxication occurs when the amount of alcohol that a person has consumed impairs his or her ability to mentally and physically function. Alcohol directly impairs the normal chemical activity of the brain. Drinking large amounts of alcohol can lead to changes in mental function and behavior, and it can cause many physical effects that can be harmful.   Alcohol intoxication can range in severity from mild to very severe. Various factors can affect the level of intoxication that occurs, such as the person's age, gender, weight, frequency of alcohol consumption, and the presence of other medical conditions (such as diabetes, seizures, or heart conditions). Dangerous levels of alcohol intoxication may occur when people drink large amounts of alcohol in a short period (binge drinking). Alcohol can also be especially dangerous when combined with certain prescription medicines or "recreational" drugs.  SIGNS AND SYMPTOMS  Some common signs and symptoms of mild alcohol intoxication include:  · Loss of coordination.  · Changes in mood and behavior.  · Impaired judgment.  · Slurred speech.  As alcohol intoxication progresses to more severe levels, other signs and symptoms will appear. These may include:  · Vomiting.  · Confusion and impaired memory.  · Slowed breathing.  · Seizures.  · Loss of consciousness.  DIAGNOSIS   Your health care provider will take a medical history and perform a physical exam. You will be asked about the amount and type of alcohol you have consumed. Blood tests will be done to measure the concentration of alcohol in your blood. In many places, your blood alcohol level must be lower than 80 mg/dL (0.08%) to legally drive. However, many dangerous effects of alcohol can occur at much lower levels.   TREATMENT   People with alcohol intoxication often do not require treatment. Most of the effects of alcohol intoxication are temporary, and they go away as the alcohol naturally  leaves the body. Your health care provider will monitor your condition until you are stable enough to go home. Fluids are sometimes given through an IV access tube to help prevent dehydration.   HOME CARE INSTRUCTIONS  · Do not drive after drinking alcohol.  · Stay hydrated. Drink enough water and fluids to keep your urine clear or pale yellow. Avoid caffeine.    · Only take over-the-counter or prescription medicines as directed by your health care provider.    SEEK MEDICAL CARE IF:   · You have persistent vomiting.    · You do not feel better after a few days.  · You have frequent alcohol intoxication. Your health care provider can help determine if you should see a substance use treatment counselor.  SEEK IMMEDIATE MEDICAL CARE IF:   · You become shaky or tremble when you try to stop drinking.    · You shake uncontrollably (seizure).    · You throw up (vomit) blood. This may be bright red or may look like black coffee grounds.    · You have blood in your stool. This may be bright red or may appear as a black, tarry, bad smelling stool.    · You become lightheaded or faint.    MAKE SURE YOU:   · Understand these instructions.  · Will watch your condition.  · Will get help right away if you are not doing well or get worse.  Document Released: 11/08/2004 Document Revised: 10/01/2012 Document Reviewed: 07/04/2012  Exit[employer]® Patient Information ©2015 [employer]. This information is not intended to replace advice given to you by your health care provider. Make sure   you discuss any questions you have with your health care provider.

## 2013-11-19 NOTE — Care Management Note (Signed)
    Page 1 of 1   11/19/2013     1:54:47 PM CARE MANAGEMENT NOTE 11/19/2013  Patient:  Hailey [name], Hailey [name]   Account Number:  [id number]  Date Initiated:  11/19/2013  Documentation initiated by:  [facility]  Subjective/Objective Assessment:   73 y.o. female with PMHx of GERD, hypertension, osteoporosis, anxiety presented with  syncope.  Pt  had a glass of wine  prior to her visit to the restaurant.//Home alone     Action/Plan:   Head CT and cervical spine CT  on admission unremarkable for acute injury. Patient required a few stiches on her left eyebrow for laceration sustained with the fall.//Access for disposition needs.   Anticipated DC Date:  11/19/2013   Anticipated DC Plan:  [place]  CM consult      [facility]  HOME HEALTH   Choice offered to / List presented to:  C-1 Patient        [place] arranged  [facility]      [place].   Status of service:  Completed, signed off Medicare Important Message given?   (If response is "NO", the following Medicare IM given date fields will be blank) Date Medicare IM given:   Medicare IM given by:   Date Additional Medicare IM given:   Additional Medicare IM given by:    Discharge Disposition:    Per UR Regulation:    If discussed at Long Length of Stay Meetings, dates discussed:    Comments:  11/19/13 Fuller Mandril, RN, BSN, NCM [phone] Spoke with pt at bedside regarding discharge planning for [facility]. Offered pt list of home health agencies to choose from.  Pt chose [facility] to render services. Janae Sauce, RN of [facility] notified.  No DME needs identified at this time.

## 2013-11-22 ENCOUNTER — Ambulatory Visit (INDEPENDENT_AMBULATORY_CARE_PROVIDER_SITE_OTHER): Payer: Medicare Other | Admitting: Family Medicine

## 2013-11-22 VITALS — BP 123/74 | HR 93 | Temp 98.5°F | Resp 12 | Ht 66.5 in | Wt 154.2 lb

## 2013-11-22 DIAGNOSIS — S0181XD Laceration without foreign body of other part of head, subsequent encounter: Secondary | ICD-10-CM

## 2013-11-22 NOTE — Progress Notes (Signed)
Subjective:    Patient ID: Hailey [name], female    DOB: 12/06/1940, 73 y.o.   MRN: [id number]  Suture / Staple Removal   This chart was scribed for Delman Cheadle, MD by Thea Alken, ED Scribe. This patient was seen in room 10 and the patient's care was started at 3:55 PM.  HPI Comments: Hailey [name] is a 73 y.o. female who presents to the [facility] for suture removal. Pt states she fell lacerating her left eyebrow and injuring her ribs 5 days ago. Pt states she was seen at the ED where she received 4 sutures. Pt denies applying ointments or creams to area. Pt states she has been healing well.    Past Medical History  Diagnosis Date  . OSA (obstructive sleep apnea)   . Closed fracture of rib(s), unspecified   . 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 Esophageal reflux   . Osteoarthrosis, unspecified whether generalized or localized, unspecified site   . Chronic interstitial cystitis   . Personal history of other disorders of nervous system and sense organs   . Unspecified closed fracture of pelvis     x 2 2004/2006  . Disorder of bone and cartilage, unspecified   . Unspecified essential hypertension   . Other and unspecified hyperlipidemia   . Allergic rhinitis, cause unspecified   . Chronic insomnia   . Personal history of colonic adenomas 11/28/2012   Past Surgical History  Procedure Laterality Date  . Back surgery      ; has  had 8 back surgeries  . Cystoscopy    . Cervical fusion      C4-7  . Lumbar fusion      L2-3  . Hernia repair      umbilical  . Total hip arthroplasty Right 2012  . Tonsillectomy    . Tubal ligation    . Sacral fusion  07/2012  . Ankle fusion Left   . Tibia fracture surgery Right   . Colonoscopy    . Upper gastrointestinal endoscopy     Prior to Admission medications   Medication Sig Start Date End Date Taking? Authorizing Provider  Ascorbic Acid (VITAMIN C) 100 MG tablet Take 100 mg by mouth  daily.     Yes Historical Provider, MD  aspirin 81 MG tablet Take 81 mg by mouth daily.     Yes Historical Provider, MD  atorvastatin (LIPITOR) 40 MG tablet Take 40 mg by mouth daily.   Yes Historical Provider, MD  Calcium Carbonate-Vitamin D (CALCIUM + D PO) Take by mouth daily.   Yes Historical Provider, MD  celecoxib (CELEBREX) 200 MG capsule Take 200 mg by mouth daily as needed for mild pain.   Yes Historical Provider, MD  cholecalciferol (VITAMIN D) 1000 UNITS tablet Take 1,000 Units by mouth daily.   Yes Historical Provider, MD  denosumab (PROLIA) 60 MG/ML SOLN injection Inject 60 mg into the skin every 6 (six) months. Administer in upper arm, thigh, or abdomen   Yes Historical Provider, MD  EPINEPHrine (EPIPEN 2-PAK) 0.3 mg/0.3 mL IJ SOAJ injection Inject 0.3 mg into the muscle once.   Yes Historical Provider, MD  escitalopram (LEXAPRO) 20 MG tablet Take 20 mg by mouth daily.   Yes Historical Provider, MD  esomeprazole (NEXIUM) 40 MG capsule Take 40 mg by mouth daily at 12 noon.   Yes Historical Provider, MD  eszopiclone (LUNESTA) 2 MG TABS tablet Take 2  mg by mouth at bedtime as needed for sleep. Take immediately before bedtime   Yes Historical Provider, MD  ferrous sulfate 325 (65 FE) MG tablet Take 325 mg by mouth daily with breakfast.     Yes Historical Provider, MD  fexofenadine (ALLEGRA) 180 MG tablet Take 180 mg by mouth daily.     Yes Historical Provider, MD  gabapentin (NEURONTIN) 600 MG tablet Take 600 mg by mouth as needed. For pain   Yes Historical Provider, MD  lidocaine (LIDODERM) 5 % Place 1 patch onto the skin daily. Remove & Discard patch within 12 hours or as directed by MD. Apply over left lateral rib 11/19/13  Yes Nishant Dhungel, MD  LORazepam (ATIVAN) 1 MG tablet Take 1-1.5 mg by mouth at bedtime as needed for anxiety or sleep.   Yes Historical Provider, MD  losartan (COZAAR) 100 MG tablet Take 100 mg by mouth daily.   Yes Historical Provider, MD  methocarbamol (ROBAXIN)  500 MG tablet Take 500 mg by mouth. 1 by mouth 2-3 times daily, Dr.Branch Adrian [place])   Yes Historical Provider, MD  Misc Natural Products (OSTEO BI-FLEX TRIPLE STRENGTH PO) Take by mouth daily.     Yes Historical Provider, MD  Multiple Vitamin (MULTIVITAMIN PO) Take by mouth 2 (two) times daily.     Yes Historical Provider, MD  naproxen (NAPROSYN) 250 MG tablet Take 1 tablet (250 mg total) by mouth 3 (three) times daily with meals. 11/19/13  Yes Nishant Dhungel, MD  oxyCODONE 10 MG TABS Take 1 tablet (10 mg total) by mouth every 6 (six) hours as needed for moderate pain or breakthrough pain. 11/19/13  Yes Nishant Dhungel, MD  Probiotic Product (ALIGN) 4 MG CAPS Take 4 mg by mouth daily.    Yes Historical Provider, MD  promethazine (PHENERGAN) 25 MG tablet Take 25 mg by mouth every 4 (four) hours as needed for nausea or vomiting.   Yes Historical Provider, MD  saccharomyces boulardii (FLORASTOR) 250 MG capsule Take 250 mg by mouth 2 (two) times daily.   Yes Historical Provider, MD  senna (SENOKOT) 8.6 MG TABS tablet Take 2 tablets (17.2 mg total) by mouth daily. 11/19/13  Yes Nishant Dhungel, MD  spironolactone (ALDACTONE) 25 MG tablet Take 25 mg by mouth daily.   Yes Historical Provider, MD  Teriparatide, Recombinant, (FORTEO [place]) Inject into the skin. One injection daily   Yes Historical Provider, MD  verapamil (VERELAN PM) 180 MG 24 hr capsule Take 180 mg by mouth at bedtime.   Yes Historical Provider, MD   Review of Systems  Skin: Positive for wound. Negative for color change, pallor and rash.       Objective:   Physical Exam  Nursing note and vitals reviewed. Constitutional: She is oriented to person, place, and time. She appears well-developed and well-nourished. No distress.  HENT:  Head: Normocephalic and atraumatic.  Eyes: Conjunctivae and EOM are normal.  Neck: Neck supple.  Cardiovascular: Normal rate.   Pulmonary/Chest: Effort normal.  Musculoskeletal: Normal range of motion.    Neurological: She is alert and oriented to person, place, and time.  Skin: Skin is warm and dry.  2 cm linear laceration scabbed over no wound dehisency seen. 4 sutures placed. No significant surrounding bruising of the skin or hematoma. No erythema or warmth.  Psychiatric: She has a normal mood and affect. Her behavior is normal.    BP 123/74  Pulse 93  Temp(Src) 98.5 F (36.9 C) (Oral)  Resp 12  Ht 5'  6.5" (1.689 m)  Wt 154 lb 3 oz (69.939 kg)  BMI 24.52 kg/m2  SpO2 96%       Assessment & Plan:  Facial laceration, subsequent encounter - well-healed for suture removal today   I personally performed the services described in this documentation, which was scribed in my presence. The recorded information has been reviewed and considered, and addended by me as needed.  Delman Cheadle, MD MPH

## 2013-11-22 NOTE — Patient Instructions (Signed)

## 2013-11-22 NOTE — Progress Notes (Signed)
Wound is healing well. #4 sutures removed. Discussed wound care.

## 2013-11-24 ENCOUNTER — Ambulatory Visit: Payer: Medicare Other | Admitting: Physical Therapy

## 2013-11-26 ENCOUNTER — Inpatient Hospital Stay: Payer: Medicare Other | Admitting: Internal Medicine

## 2013-11-30 ENCOUNTER — Telehealth: Payer: Self-pay | Admitting: Internal Medicine

## 2013-11-30 NOTE — Telephone Encounter (Signed)
Patient stated to [place] [facility] Therapist that she is doing much better and does not need physical therapy.

## 2013-11-30 NOTE — Telephone Encounter (Signed)
This should be referred to the MD who ordered this for her

## 2013-12-11 DIAGNOSIS — Z9181 History of falling: Secondary | ICD-10-CM

## 2013-12-11 DIAGNOSIS — F10129 Alcohol abuse with intoxication, unspecified: Secondary | ICD-10-CM

## 2013-12-11 DIAGNOSIS — S01112D Laceration without foreign body of left eyelid and periocular area, subsequent encounter: Secondary | ICD-10-CM

## 2013-12-11 DIAGNOSIS — S2232XD Fracture of one rib, left side, subsequent encounter for fracture with routine healing: Secondary | ICD-10-CM

## 2013-12-14 ENCOUNTER — Other Ambulatory Visit: Payer: Self-pay | Admitting: Internal Medicine

## 2013-12-25 ENCOUNTER — Encounter: Payer: Self-pay | Admitting: Internal Medicine

## 2014-01-04 DIAGNOSIS — Z79891 Long term (current) use of opiate analgesic: Secondary | ICD-10-CM | POA: Diagnosis not present

## 2014-01-04 DIAGNOSIS — G894 Chronic pain syndrome: Secondary | ICD-10-CM | POA: Diagnosis not present

## 2014-01-04 DIAGNOSIS — M150 Primary generalized (osteo)arthritis: Secondary | ICD-10-CM | POA: Diagnosis not present

## 2014-01-09 ENCOUNTER — Other Ambulatory Visit: Payer: Self-pay | Admitting: Internal Medicine

## 2014-01-22 ENCOUNTER — Ambulatory Visit: Payer: Medicare Other | Admitting: Internal Medicine

## 2014-01-25 ENCOUNTER — Ambulatory Visit: Payer: Medicare Other | Admitting: Internal Medicine

## 2014-01-31 ENCOUNTER — Encounter (HOSPITAL_COMMUNITY): Payer: Self-pay | Admitting: Emergency Medicine

## 2014-01-31 ENCOUNTER — Inpatient Hospital Stay (HOSPITAL_COMMUNITY)
Admission: EM | Admit: 2014-01-31 | Discharge: 2014-02-04 | DRG: 551 | Disposition: A | Discharge status: Skilled Nursing Facility | Payer: Medicare Other | Attending: Internal Medicine | Admitting: Internal Medicine

## 2014-01-31 DIAGNOSIS — M549 Dorsalgia, unspecified: Secondary | ICD-10-CM | POA: Diagnosis not present

## 2014-01-31 DIAGNOSIS — Z7982 Long term (current) use of aspirin: Secondary | ICD-10-CM

## 2014-01-31 DIAGNOSIS — S129XXA Fracture of neck, unspecified, initial encounter: Secondary | ICD-10-CM | POA: Diagnosis present

## 2014-01-31 DIAGNOSIS — S0990XA Unspecified injury of head, initial encounter: Secondary | ICD-10-CM

## 2014-01-31 DIAGNOSIS — F10129 Alcohol abuse with intoxication, unspecified: Secondary | ICD-10-CM | POA: Diagnosis present

## 2014-01-31 DIAGNOSIS — R4689 Other symptoms and signs involving appearance and behavior: Secondary | ICD-10-CM

## 2014-01-31 DIAGNOSIS — S0003XA Contusion of scalp, initial encounter: Secondary | ICD-10-CM | POA: Diagnosis not present

## 2014-01-31 DIAGNOSIS — S0181XA Laceration without foreign body of other part of head, initial encounter: Secondary | ICD-10-CM | POA: Diagnosis present

## 2014-01-31 DIAGNOSIS — F10230 Alcohol dependence with withdrawal, uncomplicated: Secondary | ICD-10-CM | POA: Diagnosis present

## 2014-01-31 DIAGNOSIS — S199XXA Unspecified injury of neck, initial encounter: Secondary | ICD-10-CM | POA: Diagnosis not present

## 2014-01-31 DIAGNOSIS — F10229 Alcohol dependence with intoxication, unspecified: Secondary | ICD-10-CM | POA: Diagnosis present

## 2014-01-31 DIAGNOSIS — Z888 Allergy status to other drugs, medicaments and biological substances status: Secondary | ICD-10-CM

## 2014-01-31 DIAGNOSIS — S12100A Unspecified displaced fracture of second cervical vertebra, initial encounter for closed fracture: Principal | ICD-10-CM | POA: Diagnosis present

## 2014-01-31 DIAGNOSIS — K219 Gastro-esophageal reflux disease without esophagitis: Secondary | ICD-10-CM | POA: Diagnosis present

## 2014-01-31 DIAGNOSIS — Z981 Arthrodesis status: Secondary | ICD-10-CM

## 2014-01-31 DIAGNOSIS — M1990 Unspecified osteoarthritis, unspecified site: Secondary | ICD-10-CM | POA: Diagnosis present

## 2014-01-31 DIAGNOSIS — Y929 Unspecified place or not applicable: Secondary | ICD-10-CM

## 2014-01-31 DIAGNOSIS — G4733 Obstructive sleep apnea (adult) (pediatric): Secondary | ICD-10-CM | POA: Diagnosis present

## 2014-01-31 DIAGNOSIS — I10 Essential (primary) hypertension: Secondary | ICD-10-CM | POA: Diagnosis present

## 2014-01-31 DIAGNOSIS — Z96641 Presence of right artificial hip joint: Secondary | ICD-10-CM | POA: Diagnosis present

## 2014-01-31 DIAGNOSIS — R1110 Vomiting, unspecified: Secondary | ICD-10-CM

## 2014-01-31 DIAGNOSIS — Z87891 Personal history of nicotine dependence: Secondary | ICD-10-CM

## 2014-01-31 DIAGNOSIS — I609 Nontraumatic subarachnoid hemorrhage, unspecified: Secondary | ICD-10-CM

## 2014-01-31 DIAGNOSIS — F329 Major depressive disorder, single episode, unspecified: Secondary | ICD-10-CM | POA: Diagnosis present

## 2014-01-31 DIAGNOSIS — N3010 Interstitial cystitis (chronic) without hematuria: Secondary | ICD-10-CM | POA: Diagnosis present

## 2014-01-31 DIAGNOSIS — N179 Acute kidney failure, unspecified: Secondary | ICD-10-CM | POA: Diagnosis present

## 2014-01-31 DIAGNOSIS — Z79899 Other long term (current) drug therapy: Secondary | ICD-10-CM

## 2014-01-31 DIAGNOSIS — W19XXXA Unspecified fall, initial encounter: Secondary | ICD-10-CM | POA: Diagnosis present

## 2014-01-31 DIAGNOSIS — S12000A Unspecified displaced fracture of first cervical vertebra, initial encounter for closed fracture: Secondary | ICD-10-CM | POA: Diagnosis present

## 2014-01-31 DIAGNOSIS — M542 Cervicalgia: Secondary | ICD-10-CM | POA: Diagnosis not present

## 2014-01-31 DIAGNOSIS — E871 Hypo-osmolality and hyponatremia: Secondary | ICD-10-CM | POA: Diagnosis present

## 2014-01-31 DIAGNOSIS — E785 Hyperlipidemia, unspecified: Secondary | ICD-10-CM | POA: Diagnosis present

## 2014-01-31 HISTORY — DX: Nontraumatic subarachnoid hemorrhage, unspecified: I60.9

## 2014-01-31 HISTORY — DX: Fracture of neck, unspecified, initial encounter: S12.9XXA

## 2014-01-31 LAB — I-STAT CHEM 8, ED
BUN: 14 mg/dL (ref 6–23)
Calcium, Ion: 1.03 mmol/L — ABNORMAL LOW (ref 1.13–1.30)
Chloride: 89 mEq/L — ABNORMAL LOW (ref 96–112)
Creatinine, Ser: 1.2 mg/dL — ABNORMAL HIGH (ref 0.50–1.10)
GLUCOSE: 122 mg/dL — AB (ref 70–99)
HEMATOCRIT: 39 % (ref 36.0–46.0)
Hemoglobin: 13.3 g/dL (ref 12.0–15.0)
POTASSIUM: 4.5 meq/L (ref 3.7–5.3)
Sodium: 123 mEq/L — ABNORMAL LOW (ref 137–147)
TCO2: 19 mmol/L (ref 0–100)

## 2014-01-31 NOTE — ED Notes (Addendum)
Awake. Verbally responsive. A/O x4. Resp even and unlabored. No audible adventitious breath sounds noted. ABC's intact. Pt c-collar and backboard. Pt requesting removal of items. Pt explained that cannot remove items until seen via MD/PA. Pt has bruising to bil knees, abrasion to lt eye, nose and lt and rt hand, and bruising to rt eye.

## 2014-01-31 NOTE — ED Notes (Signed)
Bed: WA25 Expected date:  Expected time:  Means of arrival:  Comments: EMS  

## 2014-01-31 NOTE — ED Provider Notes (Addendum)
CSN: [id number]     Arrival date & time 01/31/14  2153 History   First MD Initiated Contact with Patient 01/31/14 2212     Chief Complaint  Patient presents with  . Fall  . Facial Laceration     (Consider location/radiation/quality/duration/timing/severity/associated sxs/prior Treatment) HPI Brought via EMS she reports that she drank too much alcohol tonight. She became unsteady on her feet and fell striking her face. She denies pain anywhere. She sustained facial degrees as a result of fall. EMS treated patient with long board hard collar and CID. Past Medical History  Diagnosis Date  . OSA (obstructive sleep apnea)   . Closed fracture of rib(s), unspecified   . 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 Esophageal reflux   . Osteoarthrosis, unspecified whether generalized or localized, unspecified site   . Chronic interstitial cystitis   . Personal history of other disorders of nervous system and sense organs   . Unspecified closed fracture of pelvis     x 2 2004/2006  . Disorder of bone and cartilage, unspecified   . Unspecified essential hypertension   . Other and unspecified hyperlipidemia   . Allergic rhinitis, cause unspecified   . Chronic insomnia   . Personal history of colonic adenomas 11/28/2012   Past Surgical History  Procedure Laterality Date  . Back surgery      ; has  had 8 back surgeries  . Cystoscopy    . Cervical fusion      C4-7  . Lumbar fusion      L2-3  . Hernia repair      umbilical  . Total hip arthroplasty Right 2012  . Tonsillectomy    . Tubal ligation    . Sacral fusion  07/2012  . Ankle fusion Left   . Tibia fracture surgery Right   . Colonoscopy    . Upper gastrointestinal endoscopy     Family History  Problem Relation Age of Onset  . Dementia Mother   . Hypertension Father   . Heart attack Father   . Cancer Maternal Aunt     breast cancer  . Alzheimer's disease Maternal Aunt   . Stroke Paternal Uncle    . Cancer Cousin     breast cancer  . Colon cancer Neg Hx    History  Substance Use Topics  . Smoking status: Former Smoker -- 2.00 packs/day for 10 years    Types: Cigarettes    Quit date: 02/13/1967  . Smokeless tobacco: Never Used     Comment: smoked 1961-1969 , up to 3 ppd, mainly 2 ppd  . Alcohol Use: 2.4 oz/week    4 Glasses of wine per week   OB History    No data available     Review of Systems  Skin: Positive for wound.       Facial laceration and abrasions. Abrasions to both knees  All other systems reviewed and are negative.     Allergies  Resorcinol and Zolpidem  Home Medications   Prior to Admission medications   Medication Sig Start Date End Date Taking? Authorizing Provider  Ascorbic Acid (VITAMIN C) 100 MG tablet Take 100 mg by mouth daily.      Historical Provider, MD  aspirin 81 MG tablet Take 81 mg by mouth daily.      Historical Provider, MD  atorvastatin (LIPITOR) 40 MG tablet Take 40 mg by mouth daily.    Historical Provider, MD  Calcium Carbonate-Vitamin  D (CALCIUM + D PO) Take by mouth daily.    Historical Provider, MD  celecoxib (CELEBREX) 200 MG capsule Take 200 mg by mouth daily as needed for mild pain.    Historical Provider, MD  cholecalciferol (VITAMIN D) 1000 UNITS tablet Take 1,000 Units by mouth daily.    Historical Provider, MD  denosumab (PROLIA) 60 MG/ML SOLN injection Inject 60 mg into the skin every 6 (six) months. Administer in upper arm, thigh, or abdomen    Historical Provider, MD  EPINEPHrine (EPIPEN 2-PAK) 0.3 mg/0.3 mL IJ SOAJ injection Inject 0.3 mg into the muscle once.    Historical Provider, MD  escitalopram (LEXAPRO) 20 MG tablet Take 20 mg by mouth daily.    Historical Provider, MD  esomeprazole (NEXIUM) 40 MG capsule Take 40 mg by mouth daily at 12 noon.    Historical Provider, MD  eszopiclone (LUNESTA) 2 MG TABS tablet Take 2 mg by mouth at bedtime as needed for sleep. Take immediately before bedtime    Historical  Provider, MD  ferrous sulfate 325 (65 FE) MG tablet Take 325 mg by mouth daily with breakfast.      Historical Provider, MD  fexofenadine (ALLEGRA) 180 MG tablet Take 180 mg by mouth daily.      Historical Provider, MD  gabapentin (NEURONTIN) 600 MG tablet Take 600 mg by mouth as needed. For pain    Historical Provider, MD  lidocaine (LIDODERM) 5 % Place 1 patch onto the skin daily. Remove & Discard patch within 12 hours or as directed by MD. Apply over left lateral rib 11/19/13   Nishant Dhungel, MD  LORazepam (ATIVAN) 1 MG tablet Take 1-1.5 mg by mouth at bedtime as needed for anxiety or sleep.    Historical Provider, MD  losartan (COZAAR) 100 MG tablet Take 100 mg by mouth daily.    Historical Provider, MD  methocarbamol (ROBAXIN) 500 MG tablet Take 500 mg by mouth. 1 by mouth 2-3 times daily, Dr.Branch [place])    Historical Provider, MD  Misc Natural Products (OSTEO BI-FLEX TRIPLE STRENGTH PO) Take by mouth daily.      Historical Provider, MD  Multiple Vitamin (MULTIVITAMIN PO) Take by mouth 2 (two) times daily.      Historical Provider, MD  naproxen (NAPROSYN) 250 MG tablet Take 1 tablet (250 mg total) by mouth 3 (three) times daily with meals. 11/19/13   Nishant Dhungel, MD  oxyCODONE 10 MG TABS Take 1 tablet (10 mg total) by mouth every 6 (six) hours as needed for moderate pain or breakthrough pain. 11/19/13   Nishant Dhungel, MD  Probiotic Product (ALIGN) 4 MG CAPS Take 4 mg by mouth daily.     Historical Provider, MD  promethazine (PHENERGAN) 25 MG tablet Take 25 mg by mouth every 4 (four) hours as needed for nausea or vomiting.    Historical Provider, MD  saccharomyces boulardii (FLORASTOR) 250 MG capsule Take 250 mg by mouth 2 (two) times daily.    Historical Provider, MD  senna (SENOKOT) 8.6 MG TABS tablet Take 2 tablets (17.2 mg total) by mouth daily. 11/19/13   Nishant Dhungel, MD  spironolactone (ALDACTONE) 25 MG tablet TAKE 1 TABLET ONCE DAILY. 12/14/13   Hendricks Limes, MD   Teriparatide, Recombinant, (FORTEO [place]) Inject into the skin. One injection daily    Historical Provider, MD  verapamil (CALAN-SR) 180 MG CR tablet TAKE ONE TABLET AT BEDTIME. 01/11/14   Hendricks Limes, MD  verapamil (VERELAN PM) 180 MG 24 hr capsule  Take 180 mg by mouth at bedtime.    Historical Provider, MD   BP 129/79 mmHg  Pulse 96  Temp(Src) 98 F (36.7 C) (Oral)  Resp 20  SpO2 96% Physical Exam  Constitutional: She is oriented to person, place, and time. She appears well-developed and well-nourished. No distress.  Glasgow Coma Score 15  HENT:  Right Ear: External ear normal.  Left Ear: External ear normal.  2 cm hematoma at right temporal area  3 centimeter irregular   laceration of forehead. Abrasions to upper lip and the nose. No septal hematoma no dried blood nares teeth intact no trismus  Eyes: Conjunctivae are normal. Pupils are equal, round, and reactive to light.  Neck: Neck supple. No tracheal deviation present. No thyromegaly present.  Cardiovascular: Normal rate and regular rhythm.   No murmur heard. Pulmonary/Chest: Effort normal and breath sounds normal.  Abdominal: Soft. Bowel sounds are normal. She exhibits no distension. There is no tenderness.  Musculoskeletal: Normal range of motion. She exhibits no edema or tenderness.  Entire spine nontender pelvis stable nontender. Bilateral lower extremities with 2 cm abrasions at knees anteriorly. Right upper extremity with 2 mm abrasion at fifth finger. Extremities otherwise atraumatic  Neurological: She is alert and oriented to person, place, and time. No cranial nerve deficit. Coordination normal.  Strength 5 over 5 overall  Skin: Skin is warm and dry. No rash noted.  Psychiatric: She has a normal mood and affect.  Nursing note and vitals reviewed.   ED Course  Procedures (including critical care time) Labs Review Labs Reviewed  I-STAT CHEM 8, ED    Imaging Review No results found.   EKG  Interpretation None      1:25 AM pain slightly improved after treatment with intravenous fentanyl. Requesting more pain medicine. Additional intravenous fentanyl ordered. Results for orders placed or performed during the hospital encounter of 01/31/14  CBC with Differential  Result Value Ref Range   WBC 8.9 4.0 - 10.5 K/uL   RBC 3.69 (L) 3.87 - 5.11 MIL/uL   Hemoglobin 11.5 (L) 12.0 - 15.0 g/dL   HCT 34.3 (L) 36.0 - 46.0 %   MCV 93.0 78.0 - 100.0 fL   MCH 31.2 26.0 - 34.0 pg   MCHC 33.5 30.0 - 36.0 g/dL   RDW 13.9 11.5 - 15.5 %   Platelets 213 150 - 400 K/uL   Neutrophils Relative % 81 (H) 43 - 77 %   Neutro Abs 7.2 1.7 - 7.7 K/uL   Lymphocytes Relative 13 12 - 46 %   Lymphs Abs 1.2 0.7 - 4.0 K/uL   Monocytes Relative 6 3 - 12 %   Monocytes Absolute 0.6 0.1 - 1.0 K/uL   Eosinophils Relative 0 0 - 5 %   Eosinophils Absolute 0.0 0.0 - 0.7 K/uL   Basophils Relative 0 0 - 1 %   Basophils Absolute 0.0 0.0 - 0.1 K/uL  I-stat chem 8, ed  Result Value Ref Range   Sodium 123 (L) 137 - 147 mEq/L   Potassium 4.5 3.7 - 5.3 mEq/L   Chloride 89 (L) 96 - 112 mEq/L   BUN 14 6 - 23 mg/dL   Creatinine, Ser 1.20 (H) 0.50 - 1.10 mg/dL   Glucose, Bld 122 (H) 70 - 99 mg/dL   Calcium, Ion 1.03 (L) 1.13 - 1.30 mmol/L   TCO2 19 0 - 100 mmol/L   Hemoglobin 13.3 12.0 - 15.0 g/dL   HCT 39.0 36.0 - 46.0 %  Ct Head Wo Contrast  02/01/2014   CLINICAL DATA:  Fall, neck pain, frontal hematoma  EXAM: CT HEAD WITHOUT CONTRAST  CT CERVICAL SPINE WITHOUT CONTRAST  TECHNIQUE: Multidetector CT imaging of the head and cervical spine was performed following the standard protocol without intravenous contrast. Multiplanar CT image reconstructions of the cervical spine were also generated.  COMPARISON:  Head CT 11/17/2013  FINDINGS: CT HEAD FINDINGS  Frontal scalp hematoma is identified. No underlying skull fracture. Orbits and paranasal sinuses are grossly unremarkable. Mild cortical volume loss noted with  proportional ventricular prominence. Areas of periventricular white matter hypodensity are most compatible with small vessel ischemic change. No acute hemorrhage, infarct, or mass lesion is identified. No midline shift.  CT CERVICAL SPINE FINDINGS  C1 through the cervicothoracic junction is visualized in its entirety. There is a type 2 fracture through the body of C2. Fractures through the bilateral lamina of C1 and anterior aspect of the C1 ring are identified. Evidence of previous posterior fusion spanning C4-C7 without evidence for hardware failure. Vertebral body heights are preserved. Moderate multi-level mid/inferior cervical spine disc degenerative change noted. Lung apices are clear.  IMPRESSION: Frontal scalp hematoma without acute intracranial finding.  Type 2 fracture of the body of the dens and Jefferson fracture of C1.  Critical Value/emergent results were called by telephone at the time of interpretation on 02/01/2014 at 12:30 am to Dr. Orlie Dakin , who verbally acknowledged these results.   Electronically Signed   By: Conchita Paris M.D.   On: 02/01/2014 00:33   Ct Cervical Spine Wo Contrast  02/01/2014   CLINICAL DATA:  Fall, neck pain, frontal hematoma  EXAM: CT HEAD WITHOUT CONTRAST  CT CERVICAL SPINE WITHOUT CONTRAST  TECHNIQUE: Multidetector CT imaging of the head and cervical spine was performed following the standard protocol without intravenous contrast. Multiplanar CT image reconstructions of the cervical spine were also generated.  COMPARISON:  Head CT 11/17/2013  FINDINGS: CT HEAD FINDINGS  Frontal scalp hematoma is identified. No underlying skull fracture. Orbits and paranasal sinuses are grossly unremarkable. Mild cortical volume loss noted with proportional ventricular prominence. Areas of periventricular white matter hypodensity are most compatible with small vessel ischemic change. No acute hemorrhage, infarct, or mass lesion is identified. No midline shift.  CT CERVICAL SPINE  FINDINGS  C1 through the cervicothoracic junction is visualized in its entirety. There is a type 2 fracture through the body of C2. Fractures through the bilateral lamina of C1 and anterior aspect of the C1 ring are identified. Evidence of previous posterior fusion spanning C4-C7 without evidence for hardware failure. Vertebral body heights are preserved. Moderate multi-level mid/inferior cervical spine disc degenerative change noted. Lung apices are clear.  IMPRESSION: Frontal scalp hematoma without acute intracranial finding.  Type 2 fracture of the body of the dens and Jefferson fracture of C1.  Critical Value/emergent results were called by telephone at the time of interpretation on 02/01/2014 at 12:30 am to Dr. Orlie Dakin , who verbally acknowledged these results.   Electronically Signed   By: Conchita Paris M.D.   On: 02/01/2014 00:33    MDM  CT scans of brain and cervical spine ordered as patient mildly intoxicated, with scalp hematoma, I cannot clear patient's cervical spine clinically due to intoxication and advanced age  Facial laceration is not amenable to repair. We will place antibiotic ointment over facial laceration and abrasions. I spoke with Dr.Cabbell from neurosurgery who suggested patient can go home in hard cervical collar. Follow up  in his office. Patient requests follow up with Dr.Branch in [facility] who performed her cervical fusion 8 years ago. In light of hyponatremia hospitalist consulted Final diagnoses:  Fall   Spoke with Dr Blaine Hamper . Plan admit to telmetry.. Patient should be watched for alcohol withdrawal Diagnosis #1 fall #2 cervical spine fractures #3 hyponatremia #4 forehead laceration #5 facial abrasions #6Alcohol abuse #7 renal insufficiency #8 anemia CRITICAL CARE Performed by: Orlie Dakin Total critical care time: 30 minute Critical care time was exclusive of separately billable procedures and treating other patients. Critical care was necessary to  treat or prevent imminent or life-threatening deterioration. Critical care was time spent personally by me on the following activities: development of treatment plan with patient and/or surrogate as well as nursing, discussions with consultants, evaluation of patient's response to treatment, examination of patient, obtaining history from patient or surrogate, ordering and performing treatments and interventions, ordering and review of laboratory studies, ordering and review of radiographic studies, pulse oximetry and re-evaluation of patient's condition.   Orlie Dakin, MD 02/01/14 [id number]  Orlie Dakin, MD 02/01/14 [phone]

## 2014-01-31 NOTE — ED Notes (Signed)
Pt arrived via EMS on stretcher in full immoblizer.with hematoma to forehead, facial laceration to forehead, and neck/back pain s/p to falling x2. Pt been drinking 4 glasses of wine at home and at restaurant. Denies (-)LOC, visual disturbances, and N/V.

## 2014-01-31 NOTE — ED Notes (Signed)
MD at bedside and pt removed from backboard. Pt undressed.

## 2014-02-01 ENCOUNTER — Emergency Department (HOSPITAL_COMMUNITY): Payer: Medicare Other

## 2014-02-01 ENCOUNTER — Inpatient Hospital Stay (HOSPITAL_COMMUNITY): Payer: Medicare Other

## 2014-02-01 DIAGNOSIS — Z7982 Long term (current) use of aspirin: Secondary | ICD-10-CM | POA: Diagnosis not present

## 2014-02-01 DIAGNOSIS — W19XXXA Unspecified fall, initial encounter: Secondary | ICD-10-CM | POA: Diagnosis present

## 2014-02-01 DIAGNOSIS — M1990 Unspecified osteoarthritis, unspecified site: Secondary | ICD-10-CM | POA: Diagnosis present

## 2014-02-01 DIAGNOSIS — G4733 Obstructive sleep apnea (adult) (pediatric): Secondary | ICD-10-CM | POA: Diagnosis present

## 2014-02-01 DIAGNOSIS — I609 Nontraumatic subarachnoid hemorrhage, unspecified: Secondary | ICD-10-CM | POA: Diagnosis present

## 2014-02-01 DIAGNOSIS — S12001A Unspecified nondisplaced fracture of first cervical vertebra, initial encounter for closed fracture: Secondary | ICD-10-CM | POA: Diagnosis not present

## 2014-02-01 DIAGNOSIS — E871 Hypo-osmolality and hyponatremia: Secondary | ICD-10-CM | POA: Diagnosis present

## 2014-02-01 DIAGNOSIS — Z981 Arthrodesis status: Secondary | ICD-10-CM | POA: Diagnosis not present

## 2014-02-01 DIAGNOSIS — Z87891 Personal history of nicotine dependence: Secondary | ICD-10-CM | POA: Diagnosis not present

## 2014-02-01 DIAGNOSIS — E785 Hyperlipidemia, unspecified: Secondary | ICD-10-CM | POA: Diagnosis present

## 2014-02-01 DIAGNOSIS — S12101A Unspecified nondisplaced fracture of second cervical vertebra, initial encounter for closed fracture: Secondary | ICD-10-CM | POA: Diagnosis not present

## 2014-02-01 DIAGNOSIS — S199XXA Unspecified injury of neck, initial encounter: Secondary | ICD-10-CM | POA: Diagnosis not present

## 2014-02-01 DIAGNOSIS — N179 Acute kidney failure, unspecified: Secondary | ICD-10-CM | POA: Diagnosis present

## 2014-02-01 DIAGNOSIS — N3010 Interstitial cystitis (chronic) without hematuria: Secondary | ICD-10-CM | POA: Diagnosis present

## 2014-02-01 DIAGNOSIS — Z96641 Presence of right artificial hip joint: Secondary | ICD-10-CM | POA: Diagnosis present

## 2014-02-01 DIAGNOSIS — F329 Major depressive disorder, single episode, unspecified: Secondary | ICD-10-CM | POA: Diagnosis present

## 2014-02-01 DIAGNOSIS — S12000A Unspecified displaced fracture of first cervical vertebra, initial encounter for closed fracture: Secondary | ICD-10-CM | POA: Diagnosis present

## 2014-02-01 DIAGNOSIS — S329XXA Fracture of unspecified parts of lumbosacral spine and pelvis, initial encounter for closed fracture: Secondary | ICD-10-CM | POA: Diagnosis not present

## 2014-02-01 DIAGNOSIS — S129XXA Fracture of neck, unspecified, initial encounter: Secondary | ICD-10-CM | POA: Diagnosis present

## 2014-02-01 DIAGNOSIS — K219 Gastro-esophageal reflux disease without esophagitis: Secondary | ICD-10-CM | POA: Diagnosis present

## 2014-02-01 DIAGNOSIS — R1110 Vomiting, unspecified: Secondary | ICD-10-CM | POA: Diagnosis not present

## 2014-02-01 DIAGNOSIS — S0181XA Laceration without foreign body of other part of head, initial encounter: Secondary | ICD-10-CM | POA: Diagnosis present

## 2014-02-01 DIAGNOSIS — S12101S Unspecified nondisplaced fracture of second cervical vertebra, sequela: Secondary | ICD-10-CM | POA: Diagnosis not present

## 2014-02-01 DIAGNOSIS — I10 Essential (primary) hypertension: Secondary | ICD-10-CM | POA: Diagnosis present

## 2014-02-01 DIAGNOSIS — Z79899 Other long term (current) drug therapy: Secondary | ICD-10-CM | POA: Diagnosis not present

## 2014-02-01 DIAGNOSIS — M542 Cervicalgia: Secondary | ICD-10-CM | POA: Diagnosis present

## 2014-02-01 DIAGNOSIS — F10229 Alcohol dependence with intoxication, unspecified: Secondary | ICD-10-CM | POA: Diagnosis present

## 2014-02-01 DIAGNOSIS — S12100A Unspecified displaced fracture of second cervical vertebra, initial encounter for closed fracture: Secondary | ICD-10-CM | POA: Diagnosis present

## 2014-02-01 DIAGNOSIS — S0003XA Contusion of scalp, initial encounter: Secondary | ICD-10-CM | POA: Diagnosis present

## 2014-02-01 DIAGNOSIS — Y929 Unspecified place or not applicable: Secondary | ICD-10-CM | POA: Diagnosis not present

## 2014-02-01 DIAGNOSIS — F10129 Alcohol abuse with intoxication, unspecified: Secondary | ICD-10-CM

## 2014-02-01 DIAGNOSIS — K210 Gastro-esophageal reflux disease with esophagitis: Secondary | ICD-10-CM

## 2014-02-01 DIAGNOSIS — S066X0A Traumatic subarachnoid hemorrhage without loss of consciousness, initial encounter: Secondary | ICD-10-CM | POA: Diagnosis not present

## 2014-02-01 DIAGNOSIS — Z888 Allergy status to other drugs, medicaments and biological substances status: Secondary | ICD-10-CM | POA: Diagnosis not present

## 2014-02-01 HISTORY — DX: Fracture of neck, unspecified, initial encounter: S12.9XXA

## 2014-02-01 LAB — BASIC METABOLIC PANEL
Anion gap: 16 — ABNORMAL HIGH (ref 5–15)
BUN: 10 mg/dL (ref 6–23)
CHLORIDE: 85 meq/L — AB (ref 96–112)
CO2: 23 mEq/L (ref 19–32)
CREATININE: 0.54 mg/dL (ref 0.50–1.10)
Calcium: 8.5 mg/dL (ref 8.4–10.5)
GFR calc Af Amer: >90 mL/min (ref >90)
GFR calc non Af Amer: >90 mL/min (ref >90)
GLUCOSE: 145 mg/dL — AB (ref 70–99)
Potassium: 3.9 mEq/L (ref 3.7–5.3)
Sodium: 124 mEq/L — ABNORMAL LOW (ref 137–147)

## 2014-02-01 LAB — HEPATIC FUNCTION PANEL
ALK PHOS: 51 U/L (ref 39–117)
ALT: 21 U/L (ref 0–35)
AST: 34 U/L (ref 0–37)
Albumin: 4.3 g/dL (ref 3.5–5.2)
BILIRUBIN TOTAL: 0.3 mg/dL (ref 0.3–1.2)
TOTAL PROTEIN: 6.9 g/dL (ref 6.0–8.3)

## 2014-02-01 LAB — CBC
HEMATOCRIT: 35.6 % — AB (ref 36.0–46.0)
HEMOGLOBIN: 12 g/dL (ref 12.0–15.0)
MCH: 31 pg (ref 26.0–34.0)
MCHC: 33.7 g/dL (ref 30.0–36.0)
MCV: 92 fL (ref 78.0–100.0)
Platelets: 212 10*3/uL (ref 150–400)
RBC: 3.87 MIL/uL (ref 3.87–5.11)
RDW: 13.6 % (ref 11.5–15.5)
WBC: 8.7 10*3/uL (ref 4.0–10.5)

## 2014-02-01 LAB — CBC WITH DIFFERENTIAL/PLATELET
Basophils Absolute: 0 10*3/uL (ref 0.0–0.1)
Basophils Relative: 0 % (ref 0–1)
EOS ABS: 0 10*3/uL (ref 0.0–0.7)
Eosinophils Relative: 0 % (ref 0–5)
HCT: 34.3 % — ABNORMAL LOW (ref 36.0–46.0)
HEMOGLOBIN: 11.5 g/dL — AB (ref 12.0–15.0)
LYMPHS ABS: 1.2 10*3/uL (ref 0.7–4.0)
Lymphocytes Relative: 13 % (ref 12–46)
MCH: 31.2 pg (ref 26.0–34.0)
MCHC: 33.5 g/dL (ref 30.0–36.0)
MCV: 93 fL (ref 78.0–100.0)
MONOS PCT: 6 % (ref 3–12)
Monocytes Absolute: 0.6 10*3/uL (ref 0.1–1.0)
Neutro Abs: 7.2 10*3/uL (ref 1.7–7.7)
Neutrophils Relative %: 81 % — ABNORMAL HIGH (ref 43–77)
PLATELETS: 213 10*3/uL (ref 150–400)
RBC: 3.69 MIL/uL — AB (ref 3.87–5.11)
RDW: 13.9 % (ref 11.5–15.5)
WBC: 8.9 10*3/uL (ref 4.0–10.5)

## 2014-02-01 MED ORDER — SODIUM CHLORIDE 0.9 % IV SOLN
1.0000 mg | Freq: Once | INTRAVENOUS | Status: AC
  Administered 2014-02-01: 1 mg via INTRAVENOUS
  Filled 2014-02-01: qty 0.2

## 2014-02-01 MED ORDER — FERROUS SULFATE 325 (65 FE) MG PO TABS
325.0000 mg | ORAL_TABLET | Freq: Every day | ORAL | Status: DC
  Filled 2014-02-01 (×2): qty 1

## 2014-02-01 MED ORDER — BACITRACIN ZINC 500 UNIT/GM EX OINT
1.0000 "application " | TOPICAL_OINTMENT | Freq: Two times a day (BID) | CUTANEOUS | Status: DC
  Administered 2014-02-01 – 2014-02-04 (×7): 1 via TOPICAL
  Filled 2014-02-01: qty 0.9
  Filled 2014-02-01: qty 28.35
  Filled 2014-02-01: qty 0.9

## 2014-02-01 MED ORDER — VITAMIN C 500 MG/5ML PO SYRP
100.0000 mg | ORAL_SOLUTION | Freq: Every day | ORAL | Status: DC
  Filled 2014-02-01: qty 1

## 2014-02-01 MED ORDER — PROMETHAZINE HCL 25 MG/ML IJ SOLN
12.5000 mg | Freq: Four times a day (QID) | INTRAMUSCULAR | Status: DC | PRN
  Administered 2014-02-01 (×2): 12.5 mg via INTRAVENOUS
  Filled 2014-02-01 (×2): qty 1

## 2014-02-01 MED ORDER — METOCLOPRAMIDE HCL 5 MG/ML IJ SOLN
5.0000 mg | Freq: Four times a day (QID) | INTRAMUSCULAR | Status: DC
  Administered 2014-02-01 – 2014-02-04 (×11): 5 mg via INTRAVENOUS
  Filled 2014-02-01 (×8): qty 1
  Filled 2014-02-01: qty 2
  Filled 2014-02-01: qty 1
  Filled 2014-02-01 (×3): qty 2
  Filled 2014-02-01 (×3): qty 1

## 2014-02-01 MED ORDER — LORATADINE 10 MG PO TABS
10.0000 mg | ORAL_TABLET | Freq: Every day | ORAL | Status: DC
  Administered 2014-02-01 – 2014-02-04 (×3): 10 mg via ORAL
  Filled 2014-02-01 (×4): qty 1

## 2014-02-01 MED ORDER — PANTOPRAZOLE SODIUM 40 MG PO TBEC: 40.0000 mg | DELAYED_RELEASE_TABLET | Freq: Every day | ORAL | Status: DC

## 2014-02-01 MED ORDER — SODIUM CHLORIDE 0.9 % IJ SOLN
3.0000 mL | Freq: Two times a day (BID) | INTRAMUSCULAR | Status: DC
  Administered 2014-02-01 (×2): 3 mL via INTRAVENOUS

## 2014-02-01 MED ORDER — LIDOCAINE 5 % EX PTCH
1.0000 | MEDICATED_PATCH | CUTANEOUS | Status: DC
  Administered 2014-02-01 – 2014-02-04 (×4): 1 via TRANSDERMAL
  Filled 2014-02-01 (×4): qty 1

## 2014-02-01 MED ORDER — MORPHINE SULFATE 2 MG/ML IJ SOLN
2.0000 mg | INTRAMUSCULAR | Status: DC | PRN
  Administered 2014-02-01 – 2014-02-04 (×15): 2 mg via INTRAVENOUS
  Filled 2014-02-01 (×15): qty 1

## 2014-02-01 MED ORDER — LORAZEPAM 1 MG PO TABS
1.0000 mg | ORAL_TABLET | Freq: Four times a day (QID) | ORAL | Status: DC | PRN
  Administered 2014-02-04: 1 mg via ORAL
  Filled 2014-02-01: qty 1

## 2014-02-01 MED ORDER — THIAMINE HCL 100 MG/ML IJ SOLN
100.0000 mg | Freq: Every day | INTRAMUSCULAR | Status: DC
  Administered 2014-02-01 – 2014-02-03 (×3): 100 mg via INTRAVENOUS
  Filled 2014-02-01 (×3): qty 1

## 2014-02-01 MED ORDER — VITAMIN B-1 100 MG PO TABS
100.0000 mg | ORAL_TABLET | Freq: Every day | ORAL | Status: DC
  Filled 2014-02-01: qty 1

## 2014-02-01 MED ORDER — LORAZEPAM 2 MG/ML IJ SOLN
1.0000 mg | Freq: Four times a day (QID) | INTRAMUSCULAR | Status: DC | PRN
  Administered 2014-02-01: 1 mg via INTRAVENOUS
  Filled 2014-02-01: qty 1

## 2014-02-01 MED ORDER — ONDANSETRON HCL 4 MG/2ML IJ SOLN
4.0000 mg | Freq: Four times a day (QID) | INTRAMUSCULAR | Status: DC | PRN
  Administered 2014-02-01 (×2): 4 mg via INTRAVENOUS
  Filled 2014-02-01 (×2): qty 2

## 2014-02-01 MED ORDER — BACITRACIN ZINC 500 UNIT/GM EX OINT
1.0000 "application " | TOPICAL_OINTMENT | Freq: Once | CUTANEOUS | Status: AC
  Administered 2014-02-01: 1 via TOPICAL

## 2014-02-01 MED ORDER — VERAPAMIL HCL ER 180 MG PO TBCR
180.0000 mg | EXTENDED_RELEASE_TABLET | Freq: Every day | ORAL | Status: DC
  Administered 2014-02-01: 180 mg via ORAL
  Filled 2014-02-01 (×2): qty 1

## 2014-02-01 MED ORDER — LORAZEPAM 1 MG PO TABS: 1.0000 mg | ORAL_TABLET | Freq: Every day | ORAL | Status: DC

## 2014-02-01 MED ORDER — VITAMIN D3 25 MCG (1000 UNIT) PO TABS
1000.0000 [IU] | ORAL_TABLET | Freq: Every day | ORAL | Status: DC
  Administered 2014-02-01 – 2014-02-04 (×3): 1000 [IU] via ORAL
  Filled 2014-02-01 (×4): qty 1

## 2014-02-01 MED ORDER — ALIGN 4 MG PO CAPS
4.0000 mg | ORAL_CAPSULE | Freq: Every day | ORAL | Status: DC
  Filled 2014-02-01: qty 1

## 2014-02-01 MED ORDER — ESCITALOPRAM OXALATE 20 MG PO TABS
20.0000 mg | ORAL_TABLET | Freq: Every day | ORAL | Status: DC
  Filled 2014-02-01: qty 1

## 2014-02-01 MED ORDER — FOLIC ACID 1 MG PO TABS
1.0000 mg | ORAL_TABLET | Freq: Every day | ORAL | Status: DC
  Filled 2014-02-01: qty 1

## 2014-02-01 MED ORDER — SODIUM CHLORIDE 0.9 % IV SOLN
INTRAVENOUS | Status: DC
  Administered 2014-02-01 – 2014-02-03 (×5): via INTRAVENOUS

## 2014-02-01 MED ORDER — CALCIUM CARBONATE-VITAMIN D 250-125 MG-UNIT PO TABS
1.0000 | ORAL_TABLET | Freq: Every day | ORAL | Status: DC
  Filled 2014-02-01: qty 1

## 2014-02-01 MED ORDER — FENTANYL CITRATE 0.05 MG/ML IJ SOLN
100.0000 ug | Freq: Once | INTRAMUSCULAR | Status: AC
  Administered 2014-02-01: 100 ug via INTRAVENOUS
  Filled 2014-02-01: qty 2

## 2014-02-01 MED ORDER — ATORVASTATIN CALCIUM 40 MG PO TABS
40.0000 mg | ORAL_TABLET | Freq: Every day | ORAL | Status: DC
  Filled 2014-02-01: qty 1

## 2014-02-01 MED ORDER — LOSARTAN POTASSIUM 50 MG PO TABS
100.0000 mg | ORAL_TABLET | Freq: Every day | ORAL | Status: DC
  Filled 2014-02-01: qty 2

## 2014-02-01 MED ORDER — LORAZEPAM 2 MG/ML IJ SOLN
0.0000 mg | Freq: Four times a day (QID) | INTRAMUSCULAR | Status: AC
  Administered 2014-02-01 – 2014-02-03 (×6): 2 mg via INTRAVENOUS
  Filled 2014-02-01 (×7): qty 1

## 2014-02-01 MED ORDER — OXYCODONE HCL 5 MG PO TABS: 10.0000 mg | ORAL_TABLET | Freq: Four times a day (QID) | ORAL | Status: DC | PRN

## 2014-02-01 MED ORDER — EPINEPHRINE 0.3 MG/0.3ML IJ SOAJ
0.3000 mg | Freq: Every day | INTRAMUSCULAR | Status: DC | PRN
Start: 2014-02-01 — End: 2014-02-01

## 2014-02-01 MED ORDER — ASPIRIN 81 MG PO CHEW
81.0000 mg | CHEWABLE_TABLET | Freq: Every day | ORAL | Status: DC
  Filled 2014-02-01: qty 1

## 2014-02-01 MED ORDER — CELECOXIB 200 MG PO CAPS
200.0000 mg | ORAL_CAPSULE | Freq: Every day | ORAL | Status: DC | PRN
  Filled 2014-02-01: qty 1

## 2014-02-01 MED ORDER — MORPHINE SULFATE 2 MG/ML IJ SOLN
1.0000 mg | INTRAMUSCULAR | Status: DC | PRN
  Administered 2014-02-01 (×3): 1 mg via INTRAVENOUS
  Filled 2014-02-01 (×3): qty 1

## 2014-02-01 MED ORDER — FENTANYL CITRATE 0.05 MG/ML IJ SOLN
50.0000 ug | Freq: Once | INTRAMUSCULAR | Status: AC
  Administered 2014-02-01: 50 ug via INTRAVENOUS
  Filled 2014-02-01: qty 2

## 2014-02-01 MED ORDER — SENNA 8.6 MG PO TABS: 2.0000 | ORAL_TABLET | Freq: Every day | ORAL | Status: DC

## 2014-02-01 MED ORDER — ADULT MULTIVITAMIN W/MINERALS CH
1.0000 | ORAL_TABLET | Freq: Every day | ORAL | Status: DC
  Filled 2014-02-01: qty 1

## 2014-02-01 MED ORDER — PANTOPRAZOLE SODIUM 40 MG IV SOLR
40.0000 mg | Freq: Every day | INTRAVENOUS | Status: DC
  Administered 2014-02-01 – 2014-02-03 (×3): 40 mg via INTRAVENOUS
  Filled 2014-02-01 (×3): qty 40

## 2014-02-01 MED ORDER — LORAZEPAM 2 MG/ML IJ SOLN
0.0000 mg | Freq: Two times a day (BID) | INTRAMUSCULAR | Status: DC
  Administered 2014-02-01: 2 mg via INTRAVENOUS
  Administered 2014-02-03: 1 mg via INTRAVENOUS
  Filled 2014-02-01: qty 1

## 2014-02-01 MED ORDER — ONDANSETRON HCL 4 MG/2ML IJ SOLN
4.0000 mg | Freq: Once | INTRAMUSCULAR | Status: AC
  Administered 2014-02-01: 4 mg via INTRAVENOUS
  Filled 2014-02-01: qty 2

## 2014-02-01 NOTE — Evaluation (Signed)
Physical Therapy Evaluation Patient Details Name: Hailey [name] MRN: [id number] DOB: 06/21/1940 Today's Date: 02/01/2014   History of Present Illness  Pt is a 73 y.o. female with PMH of GERD, hypertension, hyperlipidemia, alcoholism who was admitted 01/31/14 after fall.   CT scan of the head and cervical spine showed a frontal scalp hematoma and a type II fracture of the body of the dens/Jefferson fracture of C1 and neurosurgery consult recommended immobilization with c-collar and outpatient follow up.  Clinical Impression  Pt admitted with above diagnosis. Pt currently with functional limitations due to the deficits listed below (see PT Problem List).  Pt will benefit from skilled PT to increase their independence and safety with mobility to allow discharge to the venue listed below.  Pt agreeable to mobilize as tolerated however unable to tolerate even sitting EOB.  Pt wearing Aspen cervical brace and PT provided support/more stability to head and neck during transfer.  Pt with limited mobility at this time due to pain.  Currently recommend SNF upon d/c however will follow in acute and update recommendations.     Follow Up Recommendations SNF    Equipment Recommendations  None recommended by PT (TBA further if home and with progress)    Recommendations for Other Services       Precautions / Restrictions Precautions Precautions: Fall;Cervical Required Braces or Orthoses: Cervical Brace Cervical Brace: Hard collar;At all times      Mobility  Bed Mobility Overal bed mobility: Needs Assistance Bed Mobility: Supine to Sit;Rolling;Sidelying to Sit Rolling: Min assist Sidelying to sit: Min assist Supine to sit: Min assist     General bed mobility comments: attempted rolling and sitting upright however pt with increased pain, assist to provide more support for neck and head per pt request and pt also in Aspen cervical brace, able to sit  upright very briefly however unable to maintain  and assisted back to bed again assist mainly for more head and neck support  Transfers                    Ambulation/Gait                Stairs            Wheelchair Mobility    Modified Rankin (Stroke Patients Only)       Balance                                             Pertinent Vitals/Pain Pain Assessment: 0-10 Pain Score: 10-Worst pain ever Pain Location: neck Pain Intervention(s): Limited activity within patient's tolerance;Patient requesting pain meds-RN notified;Monitored during session    Home Living Family/patient expects to be discharged to:: Private residence Living Arrangements: Non-relatives/Friends   Type of Home: House Home Access: Stairs to enter     [place]: Two level;Other (Comment) (elevator) Home Equipment: Walker - 2 wheels;Cane - single point;Bedside commode;Shower seat      Prior Function Level of Independence: Independent with assistive device(s)               Hand Dominance        Extremity/Trunk Assessment               Lower Extremity Assessment: Generalized weakness         Communication   Communication: No difficulties  Cognition Arousal/Alertness: Awake/alert Behavior During  Therapy: WFL for tasks assessed/performed Overall Cognitive Status: Within Functional Limits for tasks assessed                      General Comments      Exercises        Assessment/Plan    PT Assessment Patient needs continued PT services  PT Diagnosis Difficulty walking;Acute pain   PT Problem List Decreased strength;Decreased activity tolerance;Decreased mobility;Pain;Decreased knowledge of precautions  PT Treatment Interventions DME instruction;Gait training;Functional mobility training;Therapeutic activities;Therapeutic exercise;Patient/family education;Balance training   PT Goals (Current goals can be found in the Care Plan section) Acute Rehab PT Goals PT Goal  Formulation: With patient Time For Goal Achievement: 02/15/14 Potential to Achieve Goals: Fair    Frequency Min 3X/week   Barriers to discharge        Co-evaluation               End of Session Equipment Utilized During Treatment: Cervical collar Activity Tolerance: Patient limited by pain Patient left: in bed;with call bell/phone within reach;with family/visitor present Nurse Communication: Patient requests pain meds         Time: 5003-7048 PT Time Calculation (min) (ACUTE ONLY): 20 min   Charges:   PT Evaluation $Initial PT Evaluation Tier I: 1 Procedure PT Treatments $Therapeutic Activity: 8-22 mins   PT G Codes:          Khloi Rawl,KATHrine E 02/01/2014, 3:03 PM Carmelia Bake, PT, DPT 02/01/2014 Pager: [phone]

## 2014-02-01 NOTE — ED Notes (Signed)
Admitting MD at bedside.

## 2014-02-01 NOTE — ED Notes (Signed)
Patient transported to CT 

## 2014-02-01 NOTE — ED Notes (Signed)
Placed call to [facility] on the 4th floor [place].

## 2014-02-01 NOTE — Progress Notes (Signed)
Since admission at 0240 pt has vomitted x4 food particles and strong ETOH odor.  Suction set up and log rolled prn.  C/o of neck pain of 7 -10 and continued nausea.  Kathline Magic on call and notified.  Neuro without changes.  Some tachy with emesis but otherwise SR

## 2014-02-01 NOTE — ED Notes (Signed)
Pt encourage on several occasions not to move or lift head d/t possible neck fractures.

## 2014-02-01 NOTE — ED Notes (Signed)
Patient returned from  CT without distress noted. 

## 2014-02-01 NOTE — ED Notes (Signed)
Nurses x2 applied [employer] using spinal stabilization. Pt tolerated well.

## 2014-02-01 NOTE — H&P (Signed)
[facility] History and Physical  Hailey [name] ZJQ:[id number] DOB: June 05, 1940 DOA: 01/31/2014  Referring physician: ED physician PCP: [name], MD  Specialists:   Chief Complaint: fall after drinking  HPI: Hailey [name] is a 73 y.o. female with a past medical history for GERD, hypertension, hyperlipidemia, alcoholism, who presented with fall after drinking alcohol.  Patient reports that she drank 4 glass of wine last night. She became unsteady on her feet and fell striking her face. She has skin lacerations in several places. She complaining of neck pain. EMS treated patient with long board hard collar and CID. She denies fever, chills, fatigue, cough, chest pain, SOB, abdominal pain, diarrhea, constipation, dysuria, urgency, frequency, hematuria, skin rashes, or leg swelling.  CT-head and C-spin showed frontal scalp hematoma without acute intracranial abnormalities and type 2 fracture of the body of the dens and Jefferson fracture of C1. BMP showed hyponatremia with sodium 123.  Review of Systems: As presented in the history of presenting illness, rest negative.  Where does patient live?  At home Can patient participate in ADLs? Yes  Allergy:  Allergies  Allergen Reactions  . Resorcinol Swelling and Other (See Comments)    blisters  . Zolpidem     automated behavior as wandering    Past Medical History  Diagnosis Date  . OSA (obstructive sleep apnea)   . Closed fracture of rib(s), unspecified   . 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 Esophageal reflux   . Osteoarthrosis, unspecified whether generalized or localized, unspecified site   . Chronic interstitial cystitis   . Personal history of other disorders of nervous system and sense organs   . Unspecified closed fracture of pelvis     x 2 2004/2006  . Disorder of bone and cartilage, unspecified   . Unspecified essential hypertension   . Other and unspecified hyperlipidemia   .  Allergic rhinitis, cause unspecified   . Chronic insomnia   . Personal history of colonic adenomas 11/28/2012    Past Surgical History  Procedure Laterality Date  . Back surgery      ; has  had 8 back surgeries  . Cystoscopy    . Cervical fusion      C4-7  . Lumbar fusion      L2-3  . Hernia repair      umbilical  . Total hip arthroplasty Right 2012  . Tonsillectomy    . Tubal ligation    . Sacral fusion  07/2012  . Ankle fusion Left   . Tibia fracture surgery Right   . Colonoscopy    . Upper gastrointestinal endoscopy      Social History:  reports that she quit smoking about 47 years ago. Her smoking use included Cigarettes. She has a 20 pack-year smoking history. She has never used smokeless tobacco. She reports that she drinks about 2.4 oz of alcohol per week. She reports that she does not use illicit drugs.  Family History:  Family History  Problem Relation Age of Onset  . Dementia Mother   . Hypertension Father   . Heart attack Father   . Cancer Maternal Aunt     breast cancer  . Alzheimer's disease Maternal Aunt   . Stroke Paternal Uncle   . Cancer Cousin     breast cancer  . Colon cancer Neg Hx      Prior to Admission medications   Medication Sig Start Date End Date Taking? Authorizing Provider  Ascorbic Acid (VITAMIN C) 100 MG tablet Take 100 mg by mouth daily.     Yes Historical Provider, MD  aspirin 81 MG tablet Take 81 mg by mouth daily.     Yes Historical Provider, MD  atorvastatin (LIPITOR) 40 MG tablet Take 40 mg by mouth daily.   Yes Historical Provider, MD  Calcium Carbonate-Vitamin D (CALCIUM + D PO) Take by mouth daily.   Yes Historical Provider, MD  celecoxib (CELEBREX) 200 MG capsule Take 200 mg by mouth daily as needed for mild pain.   Yes Historical Provider, MD  cholecalciferol (VITAMIN D) 1000 UNITS tablet Take 1,000 Units by mouth daily.   Yes Historical Provider, MD  ciprofloxacin (CIPRO) 500 MG tablet Take 500 mg by mouth 2 (two) times  daily.  01/25/14  Yes Historical Provider, MD  denosumab (PROLIA) 60 MG/ML SOLN injection Inject 60 mg into the skin every 6 (six) months. Administer in upper arm, thigh, or abdomen   Yes Historical Provider, MD  EPINEPHrine (EPIPEN 2-PAK) 0.3 mg/0.3 mL IJ SOAJ injection Inject 0.3 mg into the muscle daily as needed. Allergic reaction   Yes Historical Provider, MD  escitalopram (LEXAPRO) 20 MG tablet Take 20 mg by mouth daily.   Yes Historical Provider, MD  esomeprazole (NEXIUM) 40 MG capsule Take 40 mg by mouth daily at 12 noon.   Yes Historical Provider, MD  ferrous sulfate 325 (65 FE) MG tablet Take 325 mg by mouth daily with breakfast.     Yes Historical Provider, MD  fexofenadine (ALLEGRA) 180 MG tablet Take 180 mg by mouth daily.     Yes Historical Provider, MD  lidocaine (LIDODERM) 5 % Place 1 patch onto the skin daily. Remove & Discard patch within 12 hours or as directed by MD. Apply over left lateral rib Patient taking differently: Place 1 patch onto the skin daily as needed (pain). Remove & Discard patch within 12 hours or as directed by MD. Apply over left lateral rib 11/19/13  Yes Nishant Dhungel, MD  LORazepam (ATIVAN) 1 MG tablet Take 1 mg by mouth at bedtime.    Yes Historical Provider, MD  losartan (COZAAR) 100 MG tablet Take 100 mg by mouth daily.   Yes Historical Provider, MD  Misc Natural Products (OSTEO BI-FLEX TRIPLE STRENGTH PO) Take 1 tablet by mouth 2 (two) times daily.    Yes Historical Provider, MD  Multiple Vitamin (MULTIVITAMIN PO) Take 1 tablet by mouth daily.    Yes Historical Provider, MD  Probiotic Product (ALIGN) 4 MG CAPS Take 4 mg by mouth daily.    Yes Historical Provider, MD  spironolactone (ALDACTONE) 25 MG tablet TAKE 1 TABLET ONCE DAILY. 12/14/13  Yes Hendricks Limes, MD  Teriparatide, Recombinant, (FORTEO [place]) Inject into the skin every 6 (six) months.    Yes Historical Provider, MD  verapamil (CALAN-SR) 180 MG CR tablet TAKE ONE TABLET AT BEDTIME. 01/11/14   Yes Hendricks Limes, MD  naproxen (NAPROSYN) 250 MG tablet Take 1 tablet (250 mg total) by mouth 3 (three) times daily with meals. Patient not taking: Reported on 01/31/2014 11/19/13   Nishant Dhungel, MD  oxyCODONE 10 MG TABS Take 1 tablet (10 mg total) by mouth every 6 (six) hours as needed for moderate pain or breakthrough pain. Patient not taking: Reported on 01/31/2014 11/19/13   Nishant Dhungel, MD  senna (SENOKOT) 8.6 MG TABS tablet Take 2 tablets (17.2 mg total) by mouth daily. Patient not taking: Reported on 01/31/2014 11/19/13   Nishant  Dhungel, MD    Physical Exam: Filed Vitals:   02/01/14 0100 02/01/14 0130 02/01/14 0200 02/01/14 0252  BP: 132/76 130/78 137/80 151/93  Pulse: 103 99 98 95  Temp:    97.5 F (36.4 C)  TempSrc:    Oral  Resp:    18  Height:    5\' 6"  (1.676 m)  Weight:    69.7 kg (153 lb 10.6 oz)  SpO2: 95% 91% 94% 96%   General: Not in acute distress HEENT: 2 cm hematoma at right temporal area, 3 centimeter laceration of forehead. Abrasions to upper lip and nose.        Eyes: PERRL, EOMI, no scleral icterus       ENT: No discharge from the ears and nose, no pharynx injection, no tonsillar enlargement.        Neck: No JVD, no bruit, no mass felt. Cardiac: S1/S2, RRR, No murmurs, No gallops or rubs Pulm: Good air movement bilaterally. Clear to auscultation bilaterally. No rales, wheezing, rhonchi or rubs. Abd: Soft, nondistended, nontender, no rebound pain, no organomegaly, BS present Ext: No edema bilaterally. 2+DP/PT pulse bilaterally Musculoskeletal:  Bilateral lower extremities with 2 cm abrasions at knees anteriorly. Right upper extremity with 2 mm abrasion at fifth finger.  Skin: No rashes.  Neuro: Alert and oriented X3, cranial nerves II-XII grossly intact, muscle strength 5/5 in all extremeties, sensation to light touch intact. Brachial reflex 2+ bilaterally. Knee reflex 1+ bilaterally. Negative Babinski's sign. Normal finger to nose test. Psych: Patient  is not psychotic, no suicidal or hemocidal ideation.  Labs on Admission:  Basic Metabolic Panel:  Recent Labs Lab 01/31/14 2257 02/01/14 0535  NA 123* 124*  K 4.5 3.9  CL 89* 85*  CO2  --  23  GLUCOSE 122* 145*  BUN 14 10  CREATININE 1.20* PENDING  CALCIUM  --  8.5   Liver Function Tests: No results for input(s): AST, ALT, ALKPHOS, BILITOT, PROT, ALBUMIN in the last 168 hours. No results for input(s): LIPASE, AMYLASE in the last 168 hours. No results for input(s): AMMONIA in the last 168 hours. CBC:  Recent Labs Lab 01/31/14 2257 02/01/14 0032 02/01/14 0535  WBC  --  8.9 8.7  NEUTROABS  --  7.2  --   HGB 13.3 11.5* 12.0  HCT 39.0 34.3* 35.6*  MCV  --  93.0 92.0  PLT  --  213 212   Cardiac Enzymes: No results for input(s): CKTOTAL, CKMB, CKMBINDEX, TROPONINI in the last 168 hours.  BNP (last 3 results) No results for input(s): PROBNP in the last 8760 hours. CBG: No results for input(s): GLUCAP in the last 168 hours.  Radiological Exams on Admission: Ct Head Wo Contrast  02/01/2014   CLINICAL DATA:  Fall, neck pain, frontal hematoma  EXAM: CT HEAD WITHOUT CONTRAST  CT CERVICAL SPINE WITHOUT CONTRAST  TECHNIQUE: Multidetector CT imaging of the head and cervical spine was performed following the standard protocol without intravenous contrast. Multiplanar CT image reconstructions of the cervical spine were also generated.  COMPARISON:  Head CT 11/17/2013  FINDINGS: CT HEAD FINDINGS  Frontal scalp hematoma is identified. No underlying skull fracture. Orbits and paranasal sinuses are grossly unremarkable. Mild cortical volume loss noted with proportional ventricular prominence. Areas of periventricular white matter hypodensity are most compatible with small vessel ischemic change. No acute hemorrhage, infarct, or mass lesion is identified. No midline shift.  CT CERVICAL SPINE FINDINGS  C1 through the cervicothoracic junction is visualized in its entirety. There  is a type 2  fracture through the body of C2. Fractures through the bilateral lamina of C1 and anterior aspect of the C1 ring are identified. Evidence of previous posterior fusion spanning C4-C7 without evidence for hardware failure. Vertebral body heights are preserved. Moderate multi-level mid/inferior cervical spine disc degenerative change noted. Lung apices are clear.  IMPRESSION: Frontal scalp hematoma without acute intracranial finding.  Type 2 fracture of the body of the dens and Jefferson fracture of C1.  Critical Value/emergent results were called by telephone at the time of interpretation on 02/01/2014 at 12:30 am to Dr. Orlie Dakin , who verbally acknowledged these results.   Electronically Signed   By: Conchita Paris M.D.   On: 02/01/2014 00:33   Ct Cervical Spine Wo Contrast  02/01/2014   CLINICAL DATA:  Fall, neck pain, frontal hematoma  EXAM: CT HEAD WITHOUT CONTRAST  CT CERVICAL SPINE WITHOUT CONTRAST  TECHNIQUE: Multidetector CT imaging of the head and cervical spine was performed following the standard protocol without intravenous contrast. Multiplanar CT image reconstructions of the cervical spine were also generated.  COMPARISON:  Head CT 11/17/2013  FINDINGS: CT HEAD FINDINGS  Frontal scalp hematoma is identified. No underlying skull fracture. Orbits and paranasal sinuses are grossly unremarkable. Mild cortical volume loss noted with proportional ventricular prominence. Areas of periventricular white matter hypodensity are most compatible with small vessel ischemic change. No acute hemorrhage, infarct, or mass lesion is identified. No midline shift.  CT CERVICAL SPINE FINDINGS  C1 through the cervicothoracic junction is visualized in its entirety. There is a type 2 fracture through the body of C2. Fractures through the bilateral lamina of C1 and anterior aspect of the C1 ring are identified. Evidence of previous posterior fusion spanning C4-C7 without evidence for hardware failure. Vertebral body  heights are preserved. Moderate multi-level mid/inferior cervical spine disc degenerative change noted. Lung apices are clear.  IMPRESSION: Frontal scalp hematoma without acute intracranial finding.  Type 2 fracture of the body of the dens and Jefferson fracture of C1.  Critical Value/emergent results were called by telephone at the time of interpretation on 02/01/2014 at 12:30 am to Dr. Orlie Dakin , who verbally acknowledged these results.   Electronically Signed   By: Conchita Paris M.D.   On: 02/01/2014 00:33    Assessment/Plan Principal Problem:   Hyponatremia Active Problems:   HLD (hyperlipidemia)   Essential hypertension   GERD   INTERSTITIAL CYSTITIS   Alcohol abuse with intoxication   Fall   Cervical spine fracture   AKI (acute kidney injury)   Hyponatremia: Likely secondary to alcohol drinking. Patient mental status is normal - IV fluid, normal saline 75 mL per hour -Check plasma osmolarity, urine osmolality and urine sodium -Repeat BMP in morning -Hold spironolactone -Fluid restriction  Cervical spine fracture: ED discussed with neurosurgeon, Dr. Cyndy Freeze, who suggested to apply c-collar -C-collar provided -Pain control  Hyperlipidemia: Patient is on Lipitor at home. [facility] continue Lipitor.  Alcohol abuse: -CIWA  -IV fluid  -Phenergan for nausea  AKI: Mild. Creatinine 1.2. Likely due to prerenal failure -Check FeUrea  Hypertension: On Cozaar, verapamil and spironolactone at home -Continue verapamil -hold Cozaar and spironolactone  GERD: -Protonix  DVT ppx: SCD  Code Status: Full code Family Communication: None at bed side.       Disposition Plan: Admit to inpatient   Date of Service 02/01/2014    Ivor Costa [facility] Pager [phone]  If 7PM-7AM, please contact night-coverage [email or web address] Password [email or web address] 02/01/2014, 6:30 AM

## 2014-02-01 NOTE — Progress Notes (Signed)
Progress Note   Hailey [name] IWP:[id number] DOB: [birth year] DOA: 01/31/2014 PCP: [name], MD   Brief Narrative:   Hailey [name] is an 73 y.o. female the PMH of GERD, hypertension, hyperlipidemia, alcoholism who was admitted 01/31/14 after falling down. She was intoxicated on admission and admitted to drinking 4 glasses of wine prior to admission. She had mild facial trauma on admission. CT scan of the head and cervical spine showed a frontal scalp hematoma and a type II fracture of the body of the dens/Jefferson fracture of C1. She was also hyponatremic with a sodium of 123.  Assessment/Plan:   Principal Problem:   Hyponatremia  Continue IV fluids with normal saline and fluid restriction.  Spironolactone on hold.  Urine osmolality, sodium and serum osmolality studies pending.  Active Problems:   HLD (hyperlipidemia)  Continue Lipitor.    Essential hypertension  Continue verapamil.  Cozaar/spironolactone on hold.    GERD  Continue Protonix.    INTERSTITIAL CYSTITIS  Urinalysis negative for signs of infection.    Alcohol abuse with intoxication  Continue detox per CIWA protocol.    Fall / Cervical spine fracture / facial lacerations/abrasions  Dr. Christella Noa (neurosurgeon) recommended a C-collar immobilizer and outpatient follow-up.  Follow up with Dr.Branch in [facility] who performed her cervical fusion 8 years ago.  Wound care to facial wounds.  PT/OT evaluations when stable.    AKI (acute kidney injury)  Likely prerenal. Holding Cozaar and spironolactone.  Creatinine now back to baseline.     DVT Prophylaxis  SCDs ordered.  Code Status: Full. Family Communication: Companion at bedside. Disposition Plan: Home when stable.   IV Access:    Peripheral IV   Procedures and diagnostic studies:   Ct Head / Ct Cervical SpineWo Contrast 02/01/2014: Frontal scalp hematoma without acute intracranial finding.  Type 2 fracture of the body  of the dens and Jefferson fracture of C1.  Critical Value/emergent results were called by telephone at the time of interpretation on 02/01/2014 at 12:30 am to Dr. Orlie Dakin , who verbally acknowledged these results.     Medical Consultants:    Telephone consultation with Dr. Christella Noa, Neurosurgery  Anti-Infectives:    None.  Subjective:    Hailey [name] complains of facial pain/neck pain.  She has not been tremulous.  She has had nausea.    Objective:    Filed Vitals:   02/01/14 0130 02/01/14 0200 02/01/14 0252 02/01/14 0651  BP: 130/78 137/80 151/93 150/95  Pulse: 99 98 95 94  Temp:   97.5 F (36.4 C) 97.9 F (36.6 C)  TempSrc:   Oral Oral  Resp:   18 18  Height:   5\' 6"  (1.676 m)   Weight:   69.7 kg (153 lb 10.6 oz)   SpO2: 91% 94% 96% 98%    Intake/Output Summary (Last 24 hours) at 02/01/14 1254 Last data filed at 02/01/14 0600  Gross per 24 hour  Intake     30 ml  Output    300 ml  Net   -270 ml    Exam: Gen:  NAD HEENT: Bilateral periorbital ecchymosis, nasal abrasion Cardiovascular:  RRR, No M/R/G Respiratory:  Lungs CTAB Gastrointestinal:  Abdomen soft, NT/ND, + BS Extremities:  No C/E/C   Data Reviewed:    Labs: Basic Metabolic Panel:  Recent Labs Lab 01/31/14 2257 02/01/14 0535  NA 123* 124*  K 4.5 3.9  CL 89* 85*  CO2  --  23  GLUCOSE  122* 145*  BUN 14 10  CREATININE 1.20* 0.54  CALCIUM  --  8.5   GFR Estimated Creatinine Clearance: 58.6 mL/min (by C-G formula based on Cr of 0.54). Liver Function Tests:  Recent Labs Lab 02/01/14 0535  AST 34  ALT 21  ALKPHOS 51  BILITOT 0.3  PROT 6.9  ALBUMIN 4.3   CBC:  Recent Labs Lab 01/31/14 2257 02/01/14 0032 02/01/14 0535  WBC  --  8.9 8.7  NEUTROABS  --  7.2  --   HGB 13.3 11.5* 12.0  HCT 39.0 34.3* 35.6*  MCV  --  93.0 92.0  PLT  --  213 212   Microbiology No results found for this or any previous visit (from the past 240 hour(s)).   Medications:   .  bacitracin  1 application Topical BID  . cholecalciferol  1,000 Units Oral Daily  . lidocaine  1 patch Transdermal Q24H  . loratadine  10 mg Oral Daily  . LORazepam  0-4 mg Intravenous Q6H   Followed by  . [START ON 02/03/2014] LORazepam  0-4 mg Intravenous Q12H  . pantoprazole (PROTONIX) IV  40 mg Intravenous Daily  . sodium chloride  3 mL Intravenous Q12H  . thiamine  100 mg Intravenous Daily   Continuous Infusions: . sodium chloride      Time spent: 30 minutes.   LOS: 1 day   Karna Abed  [facility] Pager [phone]. If unable to reach me by pager, please call my cell phone at [phone].  *Please refer to [email or web address], password [email or web address] to get updated schedule on who will round on this patient, as hospitalists switch teams weekly. If 7PM-7AM, please contact night-coverage at [email or web address], password [email or web address] for any overnight needs.  02/01/2014, 12:54 PM

## 2014-02-01 NOTE — ED Notes (Signed)
Applied bacitracin oint dsg to laceration to forehead and nose.

## 2014-02-01 NOTE — ED Notes (Signed)
Pt desating to 88% RA started on O2 at 2lpm via [place] with sats increase to 99%.

## 2014-02-01 NOTE — ED Notes (Signed)
Prior to transporting to floor pt reported feeling of nausea. Zofran given .

## 2014-02-02 ENCOUNTER — Encounter (HOSPITAL_COMMUNITY): Payer: Self-pay | Admitting: Internal Medicine

## 2014-02-02 DIAGNOSIS — I609 Nontraumatic subarachnoid hemorrhage, unspecified: Secondary | ICD-10-CM

## 2014-02-02 DIAGNOSIS — F10230 Alcohol dependence with withdrawal, uncomplicated: Secondary | ICD-10-CM | POA: Diagnosis present

## 2014-02-02 DIAGNOSIS — W19XXXD Unspecified fall, subsequent encounter: Secondary | ICD-10-CM

## 2014-02-02 DIAGNOSIS — N3010 Interstitial cystitis (chronic) without hematuria: Secondary | ICD-10-CM

## 2014-02-02 DIAGNOSIS — N179 Acute kidney failure, unspecified: Secondary | ICD-10-CM

## 2014-02-02 DIAGNOSIS — S129XXD Fracture of neck, unspecified, subsequent encounter: Secondary | ICD-10-CM

## 2014-02-02 HISTORY — DX: Nontraumatic subarachnoid hemorrhage, unspecified: I60.9

## 2014-02-02 LAB — BASIC METABOLIC PANEL
Anion gap: 11 (ref 5–15)
BUN: 6 mg/dL (ref 6–23)
CALCIUM: 7.8 mg/dL — AB (ref 8.4–10.5)
CO2: 25 mmol/L (ref 19–32)
CREATININE: 0.49 mg/dL — AB (ref 0.50–1.10)
Chloride: 87 mEq/L — ABNORMAL LOW (ref 96–112)
GFR calc Af Amer: >90 mL/min (ref >90)
Glucose, Bld: 152 mg/dL — ABNORMAL HIGH (ref 70–99)
Potassium: 3.5 mmol/L (ref 3.5–5.1)
Sodium: 123 mmol/L — ABNORMAL LOW (ref 135–145)

## 2014-02-02 LAB — OSMOLALITY: OSMOLALITY: 254 mosm/kg — AB (ref 275–300)

## 2014-02-02 LAB — GLUCOSE, CAPILLARY: Glucose-Capillary: 131 mg/dL — ABNORMAL HIGH (ref 70–99)

## 2014-02-02 MED ORDER — CYCLOBENZAPRINE HCL 10 MG PO TABS
10.0000 mg | ORAL_TABLET | Freq: Three times a day (TID) | ORAL | Status: DC | PRN
  Administered 2014-02-02 – 2014-02-04 (×4): 10 mg via ORAL
  Filled 2014-02-02 (×6): qty 1

## 2014-02-02 NOTE — Progress Notes (Signed)
CARE MANAGEMENT NOTE 02/02/2014  Patient:  YANETH, FAIRBAIRN   Account Number:  [id number]  Date Initiated:  02/02/2014  Documentation initiated by:  Karl Bales  Subjective/Objective Assessment:   Pt admitted with hyponatremia, fall, and ETOH withdrawal     Action/Plan:   pt from home   Anticipated DC Date:  02/05/2014   Anticipated DC Plan:  [place]         Choice offered to / List presented to:             Status of service:  In process, will continue to follow Medicare Important Message given?   (If response is "NO", the following Medicare IM given date fields will be blank) Date Medicare IM given:   Medicare IM given by:   Date Additional Medicare IM given:   Additional Medicare IM given by:    Discharge Disposition:    Per UR Regulation:  Reviewed for med. necessity/level of care/duration of stay  If discussed at [place] of Stay Meetings, dates discussed:    Comments:  02/02/14 MMcGibboney, RN, BSN Chart reviewed. A call from [place], [place] revealed pt declined SNF.  Will follow up in AM for [facility].
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
## 2014-02-02 NOTE — Consult Note (Signed)
Reason for Consult:C1, C2 fractures Referring Physician: Rama, Hailey [name] is an 73 y.o. female.  HPI: whom fell at her home on 12/21 after consuming alcohol sustaining a C1 fracture, and a C2 fracture. Initial neurologic exam was normal. She was admitted for observation . She had projectile vomiting, and a repeat head CT revealed a small amount of subarachnoid blood in the left anterior temporal lobe with no mass effect.   Past Medical History  Diagnosis Date  . OSA (obstructive sleep apnea)   . Closed fracture of rib(s), unspecified   . 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 Esophageal reflux   . Osteoarthrosis, unspecified whether generalized or localized, unspecified site   . Chronic interstitial cystitis   . Personal history of other disorders of nervous system and sense organs   . Unspecified closed fracture of pelvis     x 2 2004/2006  . Disorder of bone and cartilage, unspecified   . Unspecified essential hypertension   . Other and unspecified hyperlipidemia   . Allergic rhinitis, cause unspecified   . Chronic insomnia   . Personal history of colonic adenomas 11/28/2012  . SAH (subarachnoid hemorrhage) 02/02/2014  . Cervical spine fracture 02/01/2014    Past Surgical History  Procedure Laterality Date  . Back surgery      ; has  had 8 back surgeries  . Cystoscopy    . Cervical fusion      C4-7  . Lumbar fusion      L2-3  . Hernia repair      umbilical  . Total hip arthroplasty Right 2012  . Tonsillectomy    . Tubal ligation    . Sacral fusion  07/2012  . Ankle fusion Left   . Tibia fracture surgery Right   . Colonoscopy    . Upper gastrointestinal endoscopy      Family History  Problem Relation Age of Onset  . Dementia Mother   . Hypertension Father   . Heart attack Father   . Cancer Maternal Aunt     breast cancer  . Alzheimer's disease Maternal Aunt   . Stroke Paternal Uncle   . Cancer Cousin     breast cancer  .  Colon cancer Neg Hx     Social History:  reports that she quit smoking about 47 years ago. Her smoking use included Cigarettes. She has a 20 pack-year smoking history. She has never used smokeless tobacco. She reports that she drinks about 2.4 oz of alcohol per week. She reports that she does not use illicit drugs.  Allergies:  Allergies  Allergen Reactions  . Resorcinol Swelling and Other (See Comments)    blisters  . Zolpidem     automated behavior as wandering    Medications: I have reviewed the patient's current medications.  Results for orders placed or performed during the hospital encounter of 01/31/14 (from the past 48 hour(s))  I-stat chem 8, ed     Status: Abnormal   Collection Time: 01/31/14 10:57 PM  Result Value Ref Range   Sodium 123 (L) 137 - 147 mEq/L   Potassium 4.5 3.7 - 5.3 mEq/L   Chloride 89 (L) 96 - 112 mEq/L   BUN 14 6 - 23 mg/dL   Creatinine, Ser 1.20 (H) 0.50 - 1.10 mg/dL   Glucose, Bld 122 (H) 70 - 99 mg/dL   Calcium, Ion 1.03 (L) 1.13 - 1.30 mmol/L   TCO2 19 0 -  100 mmol/L   Hemoglobin 13.3 12.0 - 15.0 g/dL   HCT 39.0 36.0 - 46.0 %  CBC with Differential     Status: Abnormal   Collection Time: 02/01/14 12:32 AM  Result Value Ref Range   WBC 8.9 4.0 - 10.5 K/uL   RBC 3.69 (L) 3.87 - 5.11 MIL/uL   Hemoglobin 11.5 (L) 12.0 - 15.0 g/dL   HCT 34.3 (L) 36.0 - 46.0 %   MCV 93.0 78.0 - 100.0 fL   MCH 31.2 26.0 - 34.0 pg   MCHC 33.5 30.0 - 36.0 g/dL   RDW 13.9 11.5 - 15.5 %   Platelets 213 150 - 400 K/uL   Neutrophils Relative % 81 (H) 43 - 77 %   Neutro Abs 7.2 1.7 - 7.7 K/uL   Lymphocytes Relative 13 12 - 46 %   Lymphs Abs 1.2 0.7 - 4.0 K/uL   Monocytes Relative 6 3 - 12 %   Monocytes Absolute 0.6 0.1 - 1.0 K/uL   Eosinophils Relative 0 0 - 5 %   Eosinophils Absolute 0.0 0.0 - 0.7 K/uL   Basophils Relative 0 0 - 1 %   Basophils Absolute 0.0 0.0 - 0.1 K/uL  Basic metabolic panel     Status: Abnormal   Collection Time: 02/01/14  5:35 AM  Result  Value Ref Range   Sodium 124 (L) 137 - 147 mEq/L   Potassium 3.9 3.7 - 5.3 mEq/L   Chloride 85 (L) 96 - 112 mEq/L   CO2 23 19 - 32 mEq/L   Glucose, Bld 145 (H) 70 - 99 mg/dL   BUN 10 6 - 23 mg/dL   Creatinine, Ser 0.54 0.50 - 1.10 mg/dL    Comment: DELTA CHECK NOTED REPEATED TO VERIFY    Calcium 8.5 8.4 - 10.5 mg/dL   GFR calc non Af Amer >90 >90 mL/min   GFR calc Af Amer >90 >90 mL/min    Comment: (NOTE) The eGFR has been calculated using the CKD EPI equation. This calculation has not been validated in all clinical situations. eGFR's persistently <90 mL/min signify possible Chronic Kidney Disease.    Anion gap 16 (H) 5 - 15  CBC     Status: Abnormal   Collection Time: 02/01/14  5:35 AM  Result Value Ref Range   WBC 8.7 4.0 - 10.5 K/uL   RBC 3.87 3.87 - 5.11 MIL/uL   Hemoglobin 12.0 12.0 - 15.0 g/dL   HCT 35.6 (L) 36.0 - 46.0 %   MCV 92.0 78.0 - 100.0 fL   MCH 31.0 26.0 - 34.0 pg   MCHC 33.7 30.0 - 36.0 g/dL   RDW 13.6 11.5 - 15.5 %   Platelets 212 150 - 400 K/uL  Hepatic function panel     Status: None   Collection Time: 02/01/14  5:35 AM  Result Value Ref Range   Total Protein 6.9 6.0 - 8.3 g/dL   Albumin 4.3 3.5 - 5.2 g/dL   AST 34 0 - 37 U/L   ALT 21 0 - 35 U/L   Alkaline Phosphatase 51 39 - 117 U/L   Total Bilirubin 0.3 0.3 - 1.2 mg/dL   Bilirubin, Direct <0.2 0.0 - 0.3 mg/dL   Indirect Bilirubin NOT CALCULATED 0.3 - 0.9 mg/dL  Osmolality     Status: Abnormal   Collection Time: 02/02/14 12:00 AM  Result Value Ref Range   Osmolality 254 (L) 275 - 300 mOsm/kg    Comment: Performed at [facility]  Glucose, capillary     Status: Abnormal   Collection Time: 02/02/14  7:42 AM  Result Value Ref Range   Glucose-Capillary 131 (H) 70 - 99 mg/dL  Basic metabolic panel     Status: Abnormal   Collection Time: 02/02/14  9:00 AM  Result Value Ref Range   Sodium 123 (L) 135 - 145 mmol/L    Comment: Please note change in reference range.   Potassium 3.5 3.5 -  5.1 mmol/L    Comment: Please note change in reference range.   Chloride 87 (L) 96 - 112 mEq/L   CO2 25 19 - 32 mmol/L   Glucose, Bld 152 (H) 70 - 99 mg/dL   BUN 6 6 - 23 mg/dL   Creatinine, Ser 4.83 (L) 0.50 - 1.10 mg/dL   Calcium 7.8 (L) 8.4 - 10.5 mg/dL   GFR calc non Af Amer >90 >90 mL/min   GFR calc Af Amer >90 >90 mL/min    Comment: (NOTE) The eGFR has been calculated using the CKD EPI equation. This calculation has not been validated in all clinical situations. eGFR's persistently <90 mL/min signify possible Chronic Kidney Disease.    Anion gap 11 5 - 15    Ct Head Wo Contrast  02/01/2014   ADDENDUM REPORT: 02/01/2014 15:38  ADDENDUM: Critical Value/emergent results were called by telephone at the time of interpretation on 02/01/2014 at 3:38 pm to Dr. Hillery Aldo , who verbally acknowledged these results.   Electronically Signed   By: Bretta Bang M.D.   On: 02/01/2014 15:38   02/01/2014   CLINICAL DATA:  Patient fell 1 day prior with frontal hematoma. Projectile vomiting  EXAM: CT HEAD WITHOUT CONTRAST  TECHNIQUE: Contiguous axial images were obtained from the base of the skull through the vertex without intravenous contrast.  COMPARISON:  February 01, 2014 study obtained earlier in the day  FINDINGS: There is mild diffuse atrophy with somewhat greater frontal atrophy than elsewhere. There is a mild degree of subarachnoid hemorrhage in the anterior to mid left temporal lobe. A minimal focus of subarachnoid hemorrhage is also noted in the superior left temporal lobe on slice 19 series 2. No other appreciable subarachnoid hemorrhage is identified. There is no mass effect. There is no appreciable subdural or epidural fluid. No acute infarct is seen. There is patchy small vessel disease in the centra semiovale bilaterally.  There is a frontal scalp hematoma, stable.  No skull fracture is seen. Mastoid air cells are clear. Fracture of the anterior aspect of C1, slightly displaced,  is again noted, but incompletely visualized. The patient has known C1 and C2 fractures from CT of the cervical spine obtained earlier in the day.  IMPRESSION: There is a relatively mild degree of subarachnoid hemorrhage currently in the left temporal lobe and to a lesser amount in the right temporal lobe. These findings are best appreciated on axial slices 10, 11, and 12, series 2 on the left than on the right on slice 19 series 2. There is stable atrophy with patchy small vessel disease. There is no subdural or epidural fluid. The fracture of the anterior aspect of C1 is incompletely visualized; please see CT report of the cervical spine from earlier in the day.  Electronically Signed: By: Bretta Bang M.D. On: 02/01/2014 15:22   Ct Head Wo Contrast  02/01/2014   CLINICAL DATA:  Fall, neck pain, frontal hematoma  EXAM: CT HEAD WITHOUT CONTRAST  CT CERVICAL SPINE WITHOUT CONTRAST  TECHNIQUE: Multidetector CT  imaging of the head and cervical spine was performed following the standard protocol without intravenous contrast. Multiplanar CT image reconstructions of the cervical spine were also generated.  COMPARISON:  Head CT 11/17/2013  FINDINGS: CT HEAD FINDINGS  Frontal scalp hematoma is identified. No underlying skull fracture. Orbits and paranasal sinuses are grossly unremarkable. Mild cortical volume loss noted with proportional ventricular prominence. Areas of periventricular white matter hypodensity are most compatible with small vessel ischemic change. No acute hemorrhage, infarct, or mass lesion is identified. No midline shift.  CT CERVICAL SPINE FINDINGS  C1 through the cervicothoracic junction is visualized in its entirety. There is a type 2 fracture through the body of C2. Fractures through the bilateral lamina of C1 and anterior aspect of the C1 ring are identified. Evidence of previous posterior fusion spanning C4-C7 without evidence for hardware failure. Vertebral body heights are preserved.  Moderate multi-level mid/inferior cervical spine disc degenerative change noted. Lung apices are clear.  IMPRESSION: Frontal scalp hematoma without acute intracranial finding.  Type 2 fracture of the body of the dens and Jefferson fracture of C1.  Critical Value/emergent results were called by telephone at the time of interpretation on 02/01/2014 at 12:30 am to Dr. Doug Sou , who verbally acknowledged these results.   Electronically Signed   By: Christiana Pellant M.D.   On: 02/01/2014 00:33   Ct Cervical Spine Wo Contrast  02/01/2014   CLINICAL DATA:  Fall, neck pain, frontal hematoma  EXAM: CT HEAD WITHOUT CONTRAST  CT CERVICAL SPINE WITHOUT CONTRAST  TECHNIQUE: Multidetector CT imaging of the head and cervical spine was performed following the standard protocol without intravenous contrast. Multiplanar CT image reconstructions of the cervical spine were also generated.  COMPARISON:  Head CT 11/17/2013  FINDINGS: CT HEAD FINDINGS  Frontal scalp hematoma is identified. No underlying skull fracture. Orbits and paranasal sinuses are grossly unremarkable. Mild cortical volume loss noted with proportional ventricular prominence. Areas of periventricular white matter hypodensity are most compatible with small vessel ischemic change. No acute hemorrhage, infarct, or mass lesion is identified. No midline shift.  CT CERVICAL SPINE FINDINGS  C1 through the cervicothoracic junction is visualized in its entirety. There is a type 2 fracture through the body of C2. Fractures through the bilateral lamina of C1 and anterior aspect of the C1 ring are identified. Evidence of previous posterior fusion spanning C4-C7 without evidence for hardware failure. Vertebral body heights are preserved. Moderate multi-level mid/inferior cervical spine disc degenerative change noted. Lung apices are clear.  IMPRESSION: Frontal scalp hematoma without acute intracranial finding.  Type 2 fracture of the body of the dens and Jefferson  fracture of C1.  Critical Value/emergent results were called by telephone at the time of interpretation on 02/01/2014 at 12:30 am to Dr. Doug Sou , who verbally acknowledged these results.   Electronically Signed   By: Christiana Pellant M.D.   On: 02/01/2014 00:33   Dg Abd 2 Views  02/01/2014   CLINICAL DATA:  Vomiting for 1 day  EXAM: ABDOMEN - 2 VIEW  COMPARISON:  08/17/2011, 09/22/2004  FINDINGS: Postoperative changes are noted in the lumbar spine which are stable in appearance. No free air is seen. A nonobstructive bowel gas pattern is noted. There is a rounded calcification in the right upper quadrant which has progressed in the interval from prior exam. 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 The possibility of cholelithiasis deserve consideration as well. No other  focal abnormality is seen  IMPRESSION: Nonspecific abdomen.  Rounded calcification in the right upper quadrant as described.   Electronically Signed   By: Inez Catalina M.D.   On: 02/01/2014 16:00    Review of Systems  Constitutional: Negative.   Eyes: Negative.   Respiratory: Negative.   Cardiovascular: Negative.   Musculoskeletal: Positive for falls and neck pain.  Skin: Negative.   Neurological: Negative.   Endo/Heme/Allergies: Negative.   Psychiatric/Behavioral: Negative.        Alcohol abuse   Blood pressure 147/88, pulse 96, temperature 97.6 F (36.4 C), temperature source Oral, resp. rate 18, height $RemoveBe'5\' 6"'mjgorQOQO$  (1.676 m), weight 69.7 kg (153 lb 10.6 oz), SpO2 99 %. Physical Exam  Constitutional: She is oriented to person, place, and time.  Multiple abrasions on face, wearing a cervical collar. Dried blood on face  HENT:  Head: Normocephalic.  Eyes: Conjunctivae and EOM are normal. Pupils are equal, round, and reactive to light.  Neck:  In cervical collar  Cardiovascular: Normal rate, regular rhythm and normal heart sounds.   Respiratory: Effort normal and breath  sounds normal.  GI: Soft. Bowel sounds are normal.  Musculoskeletal: Normal range of motion.  Neurological: She is alert and oriented to person, place, and time. She has normal strength and normal reflexes. She is not disoriented. No cranial nerve deficit or sensory deficit. She exhibits normal muscle tone. Coordination normal.  Intact proprioception upper and lower extremities     Assessment/Plan: Cerebral contusion, I believe this is a non consequential injury to the brain. The fractures will be treated via cervical immobilization in the collar. The big problem is her alcohol abuse, and the multiple occasions she has been seen by medical staff for falls, and or other problems caused by the alcohol.   Hailey [name] L 02/02/2014, 1:46 PM

## 2014-02-02 NOTE — Progress Notes (Signed)
CSW received referral for SNF placement, reviewed PT evaluation recommending SNF. CSW spoke with patient who is declining SNF at this time states that she would like to opt for home health services. RNCM, Cookie aware & will see in the am.   No further CSW needs identified - CSW signing off.   Raynaldo Opitz, [facility] Clinical Social Worker cell #: [phone]

## 2014-02-02 NOTE — Progress Notes (Addendum)
Progress Note   Hailey [name] YCX:[id number] DOB: [birth year] DOA: 01/31/2014 PCP: [name], MD   Brief Narrative:   Hailey [name] is an 73 y.o. female the PMH of GERD, hypertension, hyperlipidemia, alcoholism who was admitted 01/31/14 after falling down. She was intoxicated on admission and admitted to drinking 4 glasses of wine prior to admission. She had mild facial trauma on admission. CT scan of the head and cervical spine showed a frontal scalp hematoma and a type II fracture of the body of the dens/Jefferson fracture of C1. She was also hyponatremic with a sodium of 123.  Assessment/Plan:   Principal Problem:   Fall / Cervical spine fracture / facial lacerations/abrasions / SAH  Dr. Christella Noa (neurosurgeon) recommended a C-collar immobilizer and outpatient follow-up.  Asked for a formal consult after repeat head CT done to evaluate persistent vomiting, new SAH noted.  Nursing staff reports he did see the patient last night.  Follow up with Dr.Branch in [facility] who performed her cervical fusion 8 years ago.  Wound care to facial wounds.  PT evaluation done 02/01/14, SNF recommended.  Active Problems:     Hyponatremia  Continue IV fluids with normal saline and fluid restriction.  Spironolactone on hold.  Urine osmolality, sodium and serum osmolality studies pending.      HLD (hyperlipidemia)  Continue Lipitor.    Essential hypertension  Continue verapamil.  Cozaar/spironolactone on hold.    GERD  Continue Protonix.    INTERSTITIAL CYSTITIS  Urinalysis negative for signs of infection.    Alcohol abuse with intoxication  Continue detox per CIWA protocol.  Counseled.  Psychiatry consult requested.    Fall / Cervical spine fracture / facial lacerations/abrasions / SAH  Dr. Christella Noa (neurosurgeon) recommended a C-collar immobilizer and outpatient follow-up.  Asked for a formal consult after repeat head CT done to evaluate persistent vomiting,  new SAH noted.  Follow up with Dr.Branch in [facility] who performed her cervical fusion 8 years ago.  Wound care to facial wounds.  PT/OT evaluations when stable.    AKI (acute kidney injury)  Likely prerenal. Holding Cozaar and spironolactone.  Creatinine now back to baseline.     DVT Prophylaxis  SCDs ordered.  Code Status: Full. Family Communication: [name] (daughter, [place]) by telephone [phone] (Note: Please call with updates daily). Disposition Plan: Home when stable.   IV Access:    Peripheral IV   Procedures and diagnostic studies:   Ct Head / Ct Cervical SpineWo Contrast 02/01/2014: Frontal scalp hematoma without acute intracranial finding.  Type 2 fracture of the body of the dens and Jefferson fracture of C1.  Critical Value/emergent results were called by telephone at the time of interpretation on 02/01/2014 at 12:30 am to Dr. Orlie Dakin , who verbally acknowledged these results.     Ct Head Wo Contrast 02/01/2014   ADDENDUM REPORT: 02/01/2014 15:38  ADDENDUM: Critical Value/emergent results were called by telephone at the time of interpretation on 02/01/2014 at 3:38 pm to Dr. Jacquelynn Cree , who verbally acknowledged these results. There is a relatively mild degree of subarachnoid hemorrhage currently in the left temporal lobe and to a lesser amount in the right temporal lobe. These findings are best appreciated on axial slices 10, 11, and 12, series 2 on the left than on the right on slice 19 series 2. There is stable atrophy with patchy small vessel disease. There is no subdural or epidural fluid. The fracture of the anterior aspect of C1 is  incompletely visualized; please see CT report of the cervical spine from earlier in the day.    Dg Abd 2 Views 02/01/2014: Nonspecific abdomen.  Rounded calcification in the right upper quadrant as described.     Medical Consultants:    Telephone consultation with Dr. Christella Noa, Neurosurgery (spoke with him by  telephone on 02/01/14, and requested a formal consultation given SAH)  Anti-Infectives:    None.  Subjective:   Hailey [name] is lethargic this morning. She has not had any vomiting today.  Still with facial pain.  Objective:    Filed Vitals:   02/01/14 0651 02/01/14 1426 02/01/14 2249 02/02/14 0555  BP: 150/95 165/94 150/77 147/88  Pulse: 94 104 97 96  Temp: 97.9 F (36.6 C) 97.7 F (36.5 C) 98.3 F (36.8 C) 97.6 F (36.4 C)  TempSrc: Oral Oral Oral Oral  Resp: 18 18 18 18   Height:      Weight:      SpO2: 98% 97% 98% 99%    Intake/Output Summary (Last 24 hours) at 02/02/14 0818 Last data filed at 02/01/14 1500  Gross per 24 hour  Intake    300 ml  Output      0 ml  Net    300 ml    Exam: Gen:  NAD HEENT: Bilateral periorbital ecchymosis, nasal abrasion Cardiovascular:  RRR, No M/R/G Respiratory:  Lungs CTAB Gastrointestinal:  Abdomen soft, NT/ND, + BS Extremities:  No C/E/C Wounds as noted:     Data Reviewed:    Labs: Basic Metabolic Panel:  Recent Labs Lab 01/31/14 2257 02/01/14 0535  NA 123* 124*  K 4.5 3.9  CL 89* 85*  CO2  --  23  GLUCOSE 122* 145*  BUN 14 10  CREATININE 1.20* 0.54  CALCIUM  --  8.5   GFR Estimated Creatinine Clearance: 58.6 mL/min (by C-G formula based on Cr of 0.54). Liver Function Tests:  Recent Labs Lab 02/01/14 0535  AST 34  ALT 21  ALKPHOS 51  BILITOT 0.3  PROT 6.9  ALBUMIN 4.3   CBC:  Recent Labs Lab 01/31/14 2257 02/01/14 0032 02/01/14 0535  WBC  --  8.9 8.7  NEUTROABS  --  7.2  --   HGB 13.3 11.5* 12.0  HCT 39.0 34.3* 35.6*  MCV  --  93.0 92.0  PLT  --  213 212   Microbiology No results found for this or any previous visit (from the past 240 hour(s)).   Medications:   . bacitracin  1 application Topical BID  . cholecalciferol  1,000 Units Oral Daily  . lidocaine  1 patch Transdermal Q24H  . loratadine  10 mg Oral Daily  . LORazepam  0-4 mg Intravenous Q6H   Followed by  . [START  ON 02/03/2014] LORazepam  0-4 mg Intravenous Q12H  . metoCLOPramide (REGLAN) injection  5 mg Intravenous 4 times per day  . pantoprazole (PROTONIX) IV  40 mg Intravenous Daily  . sodium chloride  3 mL Intravenous Q12H  . thiamine  100 mg Intravenous Daily   Continuous Infusions: . sodium chloride 75 mL/hr at 02/02/14 0724    Time spent: 25 minutes.   LOS: 2 days   Tecla Mailloux  [facility] Pager [phone]. If unable to reach me by pager, please call my cell phone at [phone].  *Please refer to [email or web address], password [email or web address] to get updated schedule on who will round on this patient, as hospitalists switch teams weekly. If 7PM-7AM, please contact night-coverage at [email or web address], password [email or web address]  for any overnight needs.  02/02/2014, 8:18 AM

## 2014-02-02 NOTE — Evaluation (Signed)
Occupational Therapy Evaluation Patient Details Name: Hailey [name] MRN: [id number] DOB: January 29, 1941 Today's Date: 02/02/2014    History of Present Illness Pt is a 73 y.o. female with PMH of GERD, hypertension, hyperlipidemia, alcoholism who was admitted 01/31/14 after fall.   CT scan of the head and cervical spine showed a frontal scalp hematoma and a type II fracture of the body of the dens/Jefferson fracture of C1 and neurosurgery consult recommended immobilization with c-collar and outpatient follow up.   Clinical Impression   Pt. Has decreased orientation, memory, safety, and awareness of deficits. Pt. Has acute pain but was able to perform ADLs and transfer training. Pt. Has 24 hour S but may need ST SNF for rehab prior to d/c home to increase safe d/c.    Follow Up Recommendations  SNF;Supervision/Assistance - 24 hour    Equipment Recommendations       Recommendations for Other Services       Precautions / Restrictions Precautions Precautions: Fall;Cervical Required Braces or Orthoses: Cervical Brace Cervical Brace: Hard collar;At all times      Mobility Bed Mobility Overal bed mobility: Needs Assistance Bed Mobility: Supine to Sit Rolling: Min assist Sidelying to sit: Min assist;Mod assist          Transfers Overall transfer level: Needs assistance Equipment used: 1 person hand held assist Transfers: Stand Pivot Transfers   Stand pivot transfers: Mod assist       General transfer comment:  (Pt. requires cues for hand position and to step feet for tra)    Balance                                            ADL Overall ADL's : Needs assistance/impaired Eating/Feeding: Minimal assistance   Grooming: Wash/dry hands;Wash/dry face;Oral care;Brushing hair;Moderate assistance   Upper Body Bathing: Minimal assitance   Lower Body Bathing: Maximal assistance   Upper Body Dressing : Moderate assistance   Lower Body Dressing: Total  assistance   Toilet Transfer: Moderate assistance   Toileting- Clothing Manipulation and Hygiene: Total assistance       Functional mobility during ADLs: Moderate assistance General ADL Comments: Pt. was Mod A for stand pivot transfer back to bed.      Vision                     Perception     Praxis      Pertinent Vitals/Pain Pain Assessment: 0-10 Pain Score: 3  Pain Location:  (neck) Pain Intervention(s): RN gave pain meds during session     Hand Dominance     Extremity/Trunk Assessment Upper Extremity Assessment Upper Extremity Assessment: Overall WFL for tasks assessed           Communication Communication Communication: No difficulties   Cognition Arousal/Alertness: Awake/alert Behavior During Therapy: WFL for tasks assessed/performed Overall Cognitive Status: Impaired/Different from baseline Area of Impairment: Orientation;Memory;Safety/judgement;Awareness Orientation Level: Time   Memory: Decreased short-term memory   Safety/Judgement: Decreased awareness of safety;Decreased awareness of deficits         General Comments       Exercises       Shoulder Instructions      Home Living Family/patient expects to be discharged to:: Private residence Living Arrangements: Non-relatives/Friends Available Help at Discharge: Friend(s);Available 24 hours/day Type of Home: House Home Access: Stairs to enter [employer] of Steps:  (  few steps to get into the house)   Home Layout: Two level;Other (Comment) Management consultant)     Bathroom Shower/Tub: Walk-in shower;Door   [employer] Toilet: Standard Bathroom Accessibility: Yes How Accessible: Accessible via walker Home Equipment: Walker - 2 wheels;Cane - single point;Bedside commode;Shower seat          Prior Functioning/Environment Level of Independence: Independent with assistive device(s)             OT Diagnosis: Generalized weakness;Cognitive deficits;Acute pain   OT  Problem List: Decreased strength;Decreased activity tolerance;Decreased cognition;Decreased safety awareness;Decreased knowledge of use of DME or AE;Decreased knowledge of precautions   OT Treatment/Interventions: Self-care/ADL training;Neuromuscular education;DME and/or AE instruction;Therapeutic activities;Cognitive remediation/compensation;Patient/family education    OT Goals(Current goals can be found in the care plan section) Acute Rehab OT Goals Patient Stated Goal:  (to go home) OT Goal Formulation: With patient Time For Goal Achievement: 02/16/14 Potential to Achieve Goals: Good ADL Goals Pt Will Perform Grooming: with supervision;standing Pt Will Perform Upper Body Bathing: with set-up;with supervision;sitting Pt Will Perform Lower Body Bathing: with min guard assist;sit to/from stand Pt Will Perform Upper Body Dressing: with supervision;sitting Pt Will Perform Lower Body Dressing: with min assist;with adaptive equipment;sit to/from stand Pt Will Transfer to Toilet: with supervision;bedside commode Pt Will Perform Toileting - Clothing Manipulation and hygiene: with supervision;sit to/from stand  OT Frequency: Min 2X/week   Barriers to D/C: Decreased caregiver support  Pt. may need more physical A than caregivers can provide.       Co-evaluation              End of Session Equipment Utilized During Treatment: Gait belt;Cervical collar Nurse Communication: Patient requests pain meds  Activity Tolerance: Patient tolerated treatment well Patient left: in chair;with call bell/phone within reach;with chair alarm set;with family/visitor present   Time: 6295-2841 OT Time Calculation (min): 70 min Charges:  OT General Charges $OT Visit: 1 Procedure OT Evaluation $Initial OT Evaluation Tier I: 1 Procedure OT Treatments $Self Care/Home Management : 38-52 mins $Therapeutic Activity: 8-22 mins G-Codes:    Latonia Conrow 02/22/2014, 8:57 AM

## 2014-02-03 LAB — GLUCOSE, CAPILLARY: Glucose-Capillary: 192 mg/dL — ABNORMAL HIGH (ref 70–99)

## 2014-02-03 MED ORDER — PANTOPRAZOLE SODIUM 40 MG PO TBEC
40.0000 mg | DELAYED_RELEASE_TABLET | Freq: Every day | ORAL | Status: DC
  Administered 2014-02-04: 40 mg via ORAL
  Filled 2014-02-03 (×2): qty 1

## 2014-02-03 MED ORDER — VITAMIN B-1 100 MG PO TABS
100.0000 mg | ORAL_TABLET | Freq: Every day | ORAL | Status: DC
  Administered 2014-02-04: 100 mg via ORAL
  Filled 2014-02-03: qty 1

## 2014-02-03 MED ORDER — SALINE SPRAY 0.65 % NA SOLN
1.0000 | NASAL | Status: DC | PRN
  Administered 2014-02-03 – 2014-02-04 (×2): 1 via NASAL
  Filled 2014-02-03: qty 44

## 2014-02-03 NOTE — Progress Notes (Signed)
Occupational Therapy Treatment Patient Details Name: Hailey [name] MRN: [id number] DOB: 1940/02/21 Today's Date: 02/03/2014    History of present illness Pt is a 73 y.o. female with PMH of GERD, hypertension, hyperlipidemia, alcoholism who was admitted 01/31/14 after fall.   CT scan of the head and cervical spine showed a frontal scalp hematoma and a type II fracture of the body of the dens/Jefferson fracture of C1 and neurosurgery consult recommended immobilization with c-collar and outpatient follow up.   OT comments  Pt refusing activity with OT this pm citing pain after sitting up on EOB.   Since yesterday, per pt report, she has not been back out of bed - the most she has done is sitting EOB, and has been using bedpan.  Discussed need to increase activity, and explained what she needs to be able to do in order to be able to discharge home.  Once the specifics were explained to pt (riding in car, walking to door, being able to ambulate in the house, in and out of bed, pt states she doesn't feel like she can do that, and will consider SNF.  SW alerted and will speak with pt.  I will try to see pt again this pm if time allows.   Follow Up Recommendations  SNF;Supervision/Assistance - 24 hour    Equipment Recommendations  Other (comment) (TBD)    Recommendations for Other Services      Precautions / Restrictions Precautions Precautions: Fall;Cervical Required Braces or Orthoses: Cervical Brace Cervical Brace: Hard collar;At all times       Mobility Bed Mobility               General bed mobility comments: Pt refused   Transfers                 General transfer comment: Pt refused    Balance                                   ADL                                         General ADL Comments: Pt reports she just sat up on the EOB with nursing, and is unable to work with OT.  Attempted to encourage pt, but still refused activity.  Pt  reports she is using bedpan for toileting and is not even getting up to [facility].   Discussed safety concerns with pt re: discharge home.  Discussed her limited activity, then laid out the minimum she would have to be able to perform in order to discharge to her home.  Pt states "really? I don't think I can do that" re: ride in a car, walk to the door, walk inside, and perform at least basic transfers.  Explained why she would benefit from SNF, and pt now says she may be agreeable.  SW notified.  RN notifed of pt c/o pain and request for pain meds.  Will reattempt to see again later as time allows.       Vision                     Perception     Praxis      Cognition   Behavior During Therapy: [facility] for tasks assessed/performed Overall Cognitive Status: Impaired/Different  from baseline            Safety/Judgement: Decreased awareness of safety;Decreased awareness of deficits          Extremity/Trunk Assessment               Exercises     Shoulder Instructions       General Comments      Pertinent Vitals/ Pain       Pain Assessment: Faces Faces Pain Scale: Hurts even more Pain Location: all over Pain Descriptors / Indicators: Aching Pain Intervention(s): Patient requesting pain meds-RN notified  Home Living                                          Prior Functioning/Environment              Frequency Min 2X/week     Progress Toward Goals  OT Goals(current goals can now be found in the care plan section)  Progress towards OT goals: Not progressing toward goals - comment  ADL Goals Pt Will Perform Grooming: with supervision;standing Pt Will Perform Upper Body Bathing: with set-up;with supervision;sitting Pt Will Perform Lower Body Bathing: with min guard assist;sit to/from stand Pt Will Perform Upper Body Dressing: with supervision;sitting Pt Will Perform Lower Body Dressing: with min assist;with adaptive equipment;sit to/from  stand Pt Will Transfer to Toilet: with supervision;bedside commode Pt Will Perform Toileting - Clothing Manipulation and hygiene: with supervision;sit to/from stand  Plan Discharge plan remains appropriate    Co-evaluation                 End of Session Equipment Utilized During Treatment: Cervical collar   Activity Tolerance Patient limited by pain   Patient Left in bed;with call bell/phone within reach;with family/visitor present   Nurse Communication Patient requests pain meds        Time: 2297-9892 OT Time Calculation (min): 9 min  Charges: OT General Charges $OT Visit: 1 Procedure OT Treatments $Self Care/Home Management : 8-22 mins  Hridhaan Yohn M 02/03/2014, 2:16 PM

## 2014-02-03 NOTE — Progress Notes (Signed)
Clinical Social Work Department CLINICAL SOCIAL WORK PSYCHIATRY SERVICE LINE ASSESSMENT 02/03/2014  Patient:  SEE Hailey [name]  Account:  [id number]  Admit Date:  01/31/2014  Clinical Social Worker:  Sindy Messing, LCSW  Date/Time:  02/03/2014 12:00 N Referred by:  Physician  Date referred:  02/03/2014 Reason for Referral  Psychosocial assessment   Presenting Symptoms/Problems (In the person's/family's own words):   Psych consulted due to substance use.   Abuse/Neglect/Trauma History (check all that apply)  Denies history   Abuse/Neglect/Trauma Comments:   Psychiatric History (check all that apply)  Outpatient treatment   Psychiatric medications:  Ativan 0-4 mg  Lexapro 20 mg   Current Mental Health Hospitalizations/Previous Mental Health History:   Patient reports she was diagnosed with depression about 8.5 years ago after her husband passed away. Patient reports PCP manages medications.   Current provider:   Dr. Geraldo Pitter and Date:   [place], [place]   Current Medications:   Scheduled Meds:      . bacitracin  1 application Topical BID  . cholecalciferol  1,000 Units Oral Daily  . lidocaine  1 patch Transdermal Q24H  . loratadine  10 mg Oral Daily  . LORazepam  0-4 mg Intravenous Q12H  . metoCLOPramide (REGLAN) injection  5 mg Intravenous 4 times per day  . [START ON 02/04/2014] pantoprazole  40 mg Oral Daily  . sodium chloride  3 mL Intravenous Q12H  . [START ON 02/04/2014] thiamine  100 mg Oral Daily        Continuous Infusions:      . sodium chloride 75 mL/hr at 02/02/14 2104          PRN Meds:.cyclobenzaprine, LORazepam **OR** LORazepam, morphine injection, ondansetron (ZOFRAN) IV, promethazine       Previous Impatient Admission/Date/Reason:   None reported   Emotional Health / Current Symptoms    Suicide/Self Harm  None reported   Suicide attempt in the past:   Patient denies any SI or HI. Patient denies any suicide attempts.   Other harmful behavior:    None reported   Psychotic/Dissociative Symptoms  None reported   Other Psychotic/Dissociative Symptoms:   N/A    Attention/Behavioral Symptoms  Within Normal Limits   Other Attention / Behavioral Symptoms:   Patient engaged during assessment.    Cognitive Impairment  Within Normal Limits   Other Cognitive Impairment:   Patient alert and oriented.    Mood and Adjustment  Flat    Stress, Anxiety, Trauma, Any Recent Loss/Stressor  Grief/Loss (recent or history)   Anxiety (frequency):   N/A   Phobia (specify):   N/A   Compulsive behavior (specify):   N/A   Obsessive behavior (specify):   N/A   Other:   Patient reports husband passed away about 8.5 years ago. Patient went to counseling after husband's death but reports that substance use increased after her loss.   Substance Abuse/Use  Current substance use   SBIRT completed (please refer for detailed history):  Y  Self-reported substance use:   Patient reports she has been drinking on a daily basis for the past 8.5 years. Patient reports she enjoys drinking wine but feels that her alcohol use has "gotten out of control" and has caused her have medical problems. Patient has never sought any SA treatment but is interested in options.   Urinary Drug Screen Completed:  N Alcohol level:   N/A    Environmental/Housing/Living Arrangement  Stable housing   Who is in the home:  Alone with caregivers   Emergency contact:  [facility]   Financial  Medicare  Private Insurance   Patient's Strengths and Goals (patient's own words):   Patient reports she has caregivers that assist at home. Patient is motivated to seek treatment in order to reduce her alcohol consumption.   Clinical Social Worker's Interpretive Summary:   CSW received referral in order to complete psychosocial assessment. CSW reviewed chart and met with patient at bedside. CSW introduced myself and explained role.    Patient reports that she  lives at home alone and has caregivers that assist her. Patient reports she was married for over 74 years but her husband passed away. Patient has two children and one step-child. Patient reports she has been feeling depressed and lonely since husband passed away. Patient went to hospice for counseling after husband passed away but reports that she managed her depression through drinking alcohol as well. Patient reports that she enjoyed therapy and felt it was helpful. CSW and patient discussed follow up after hospital stay.    Patient has never received treatment for substance use. Patient agreeable to complete SBIRT and aware that treatment is needed. Patient has noticed an increase in her depression and medical problems due to alcohol consumption. Patient reports she is not agreeable to inpatient rehab for SA but thinks that individual or AA meetings would be helpful. CSW provided community resources for SA treatment options and patient reports she will review options.    CSW will continue to follow to provide support throughout hospitalization.   Disposition:  Outpatient referral made/needed   [place],  [phone]

## 2014-02-03 NOTE — Progress Notes (Signed)
Clinical Social Work  Bed offers for SNF provided to patient and caregiver at bedside. Patient prefers placement at [facility]. CSW left a message with [place] to determine if they could accept patient. CSW encouraged patient to review list and have alternative options in case [facility] is unable to accept.  Sindy Messing, LCSW (Coverage for [employer])

## 2014-02-03 NOTE — Progress Notes (Signed)
Patient ID: Hailey [name], female   DOB: Jul 04, 1940, 73 y.o.   MRN: [id number] [facility] PROGRESS NOTE  [address]:[id number] DOB: [birth year] DOA: 01/31/2014 PCP: [name], MD  Brief narrative:    73 y.o. female the PMH of GERD, hypertension, hyperlipidemia, alcoholism who was admitted 01/31/14 after falling down. She was intoxicated on admission and admitted to drinking 4 glasses of wine prior to admission. She had mild facial trauma on admission. CT scan of the head and cervical spine showed a frontal scalp hematoma and a type II fracture of the body of the dens/Jefferson fracture of C1. She was also hyponatremic with a sodium of 123.  Assessment/Plan:     Principal Problem:  Fall / Cervical spine fracture / facial lacerations/abrasions / SAH  Dr. Christella Noa (neurosurgeon) recommended a C-collar immobilizer and outpatient follow-up. Asked for a formal consult after repeat head CT done to evaluate persistent vomiting, new SAH noted.   Follow up with Dr.Branch in [facility] who performed her cervical fusion 8 years ago.  Continue wound care to facial wounds.  PT evaluation done 02/01/14, SNF recommended.  Active Problems:  Hyponatremia  Likely due to alcohol abuse.   Continue IV fluids with normal saline. Repeat BMP tomorrow am.  Spironolactone on hold.    HLD (hyperlipidemia)  Continue Lipitor.   Essential hypertension  Continue verapamil.  Cozaar/spironolactone on hold.   GERD  Continue Protonix.   INTERSTITIAL CYSTITIS  Urinalysis negative for UTI.   Alcohol abuse with intoxication  Continue CIWA protocol  Counseled on cessation.   Psychiatry consult requested.   AKI (acute kidney injury)  Likely prerenal. Holding Cozaar and spironolactone.  Creatinine now WNL.   DVT Prophylaxis  SCDs ordered.  Code Status: Full. Family Communication: [name] (daughter, [place]) by telephone [phone] (Note: Please call  with updates daily). Disposition Plan: remains inpatient; SW assisting D/C plan to SNF.    IV access:   Peripheral IV  Procedures and diagnostic studies:     Ct Head / Ct Cervical SpineWo Contrast 02/01/2014: Frontal scalp hematoma without acute intracranial finding. Type 2 fracture of the body of the dens and Jefferson fracture of C1. Critical Value/emergent results were called by telephone at the time of interpretation on 02/01/2014 at 12:30 am to Dr. Orlie Dakin , who verbally acknowledged these results.   Ct Head Wo Contrast 02/01/2014 ADDENDUM REPORT: 02/01/2014 15:38 ADDENDUM: Critical Value/emergent results were called by telephone at the time of interpretation on 02/01/2014 at 3:38 pm to Dr. Jacquelynn Cree , who verbally acknowledged these results. There is a relatively mild degree of subarachnoid hemorrhage currently in the left temporal lobe and to a lesser amount in the right temporal lobe. These findings are best appreciated on axial slices 10, 11, and 12, series 2 on the left than on the right on slice 19 series 2. There is stable atrophy with patchy small vessel disease. There is no subdural or epidural fluid. The fracture of the anterior aspect of C1 is incompletely visualized; please see CT report of the cervical spine from earlier in the day.   Dg Abd 2 Views 02/01/2014: Nonspecific abdomen. Rounded calcification in the right upper quadrant as described.  Medical Consultants:   Ashok Pall, neurosurgery   Other Consultants:   Physical therapy  IAnti-Infectives:    None    Leisa Lenz, MD  [facility] Pager [phone]  If 7PM-7AM, please contact night-coverage [email or web address] Password [email or web address] 02/03/2014, 3:20 PM   LOS: 3 days  HPI/Subjective: No acute overnight events.  Objective: Filed Vitals:   02/02/14 1446 02/02/14 1800 02/02/14 2350 02/03/14 0558  BP: 137/83 145/78 157/87 166/88  Pulse: 100 94 96 92  Temp: 98.2 F (36.8 C)  98.3  F (36.8 C) 98.4 F (36.9 C)  TempSrc: Oral  Oral Oral  Resp: 18  18 18   Height:      Weight:      SpO2: 95%  94% 94%    Intake/Output Summary (Last 24 hours) at 02/03/14 1520 Last data filed at 02/03/14 0859  Gross per 24 hour  Intake      0 ml  Output    100 ml  Net   -100 ml    Exam:   General:  Pt is alert, not in acute distress; multiple facial ecchymosis   Cardiovascular: Regular rate and rhythm, S1/S2, no murmurs  Respiratory: no wheezing, no crackles, no rhonchi  Abdomen: non tender, non distended, bowel sounds present  Extremities: No edema, pulses DP and PT palpable bilaterally  Neuro: Grossly nonfocal  Data Reviewed: Basic Metabolic Panel:  Recent Labs Lab 01/31/14 2257 02/01/14 0535 02/02/14 0900  NA 123* 124* 123*  K 4.5 3.9 3.5  CL 89* 85* 87*  CO2  --  23 25  GLUCOSE 122* 145* 152*  BUN 14 10 6   CREATININE 1.20* 0.54 0.49*  CALCIUM  --  8.5 7.8*   Liver Function Tests:  Recent Labs Lab 02/01/14 0535  AST 34  ALT 21  ALKPHOS 51  BILITOT 0.3  PROT 6.9  ALBUMIN 4.3   No results for input(s): LIPASE, AMYLASE in the last 168 hours. No results for input(s): AMMONIA in the last 168 hours. CBC:  Recent Labs Lab 01/31/14 2257 02/01/14 0032 02/01/14 0535  WBC  --  8.9 8.7  NEUTROABS  --  7.2  --   HGB 13.3 11.5* 12.0  HCT 39.0 34.3* 35.6*  MCV  --  93.0 92.0  PLT  --  213 212   Cardiac Enzymes: No results for input(s): CKTOTAL, CKMB, CKMBINDEX, TROPONINI in the last 168 hours. BNP: Invalid input(s): POCBNP CBG:  Recent Labs Lab 02/02/14 0742 02/03/14 0740  GLUCAP 131* 192*    No results found for this or any previous visit (from the past 240 hour(s)).   Scheduled Meds: . bacitracin  1 application Topical BID  . cholecalciferol  1,000 Units Oral Daily  . lidocaine  1 patch Transdermal Q24H  . loratadine  10 mg Oral Daily  . LORazepam  0-4 mg Intravenous Q12H  . metoCLOPramide (REGLAN) injection  5 mg Intravenous 4  times per day  . [START ON 02/04/2014] pantoprazole  40 mg Oral Daily  . sodium chloride  3 mL Intravenous Q12H  . [START ON 02/04/2014] thiamine  100 mg Oral Daily   Continuous Infusions: . sodium chloride 75 mL/hr at 02/02/14 2104

## 2014-02-03 NOTE — Progress Notes (Signed)
Spoke with pt concerning Home Health. Pt's states that she has 24/7 sitters and [place] for [place]. Pt's personal sitter from home at bedside. Both pt and sitter states that she is there 24/7, around the clock. Pt will need an order to resume HHPT with [facility]. Thanks.

## 2014-02-03 NOTE — Progress Notes (Signed)
Physical Therapy Treatment Patient Details Name: Hailey [name] MRN: [id number] DOB: 1940/04/24 Today's Date: 02/03/2014    History of Present Illness Pt is a 73 y.o. female with PMH of GERD, hypertension, hyperlipidemia, alcoholism who was admitted 01/31/14 after fall.   CT scan of the head and cervical spine showed a frontal scalp hematoma and a type II fracture of the body of the dens/[place] fracture of C1 and neurosurgery consult recommended immobilization with c-collar and outpatient follow up.  Follow up CT 12/21 shows relatively mild degree of subarachnoid hemorrhage.    PT Comments    RN caught PT while in hallway as pt reported to RN she felt ready to get OOB (received pain meds just prior).  Pt with limited mobility today despite encouragement.  Pt appears self limiting, did not want to ambulate, ?fear of falling again and/or increasing pain however pt with little explanation of why she could not attempt progressing mobility.  Due to limited mobility, increased pain, increased assist level and high risk of falls, continue to recommend SNF.  If pt does not d/c SNF, would benefit from HHPT, 24/7 assist.   Follow Up Recommendations  SNF     Equipment Recommendations  Rolling walker with 5" wheels    Recommendations for Other Services       Precautions / Restrictions Precautions Precautions: Fall;Cervical Required Braces or Orthoses: Cervical Brace Cervical Brace: Hard collar;At all times    Mobility  Bed Mobility Overal bed mobility: Needs Assistance Bed Mobility: Sit to Sidelying;Sidelying to Sit;Rolling Rolling: Min assist Sidelying to sit: Min assist     Sit to sidelying: Mod assist;+2 for physical assistance General bed mobility comments: pt initiated rolling, support for head and neck along with Aspen brace provided by PT for pt comfort, more assist for back to bed raising LEs  Transfers Overall transfer level: Needs assistance Equipment used: Rolling walker  (2 wheeled) Transfers: Sit to/from [facility] Sit to Stand: Mod assist;+2 physical assistance Stand pivot transfers: +2 physical assistance;Mod assist       General transfer comment: verbal cues for safe technique, assist to rise and steady, increased cues for safety and slowing down with transfer as pt appeared to have increased anxiety with mobility, attempted to stand again from recliner however while donning second gown pt started sitting down again without warning, when questioned she stated she said she was going to sit down then immediately required return to bed due to dizziness/nausea so assist back to bed  Ambulation/Gait Ambulation/Gait assistance:  (pt declined despite encouragement)               Stairs            Wheelchair Mobility    Modified Rankin (Stroke Patients Only)       Balance                                    Cognition Arousal/Alertness: Awake/alert Behavior During Therapy: WFL for tasks assessed/performed Overall Cognitive Status: Impaired/Different from baseline Area of Impairment: Safety/judgement;Memory;Following commands     Memory: Decreased short-term memory Following Commands: Follows one step commands with increased time (possibly due to pain, pain meds) Safety/Judgement: Decreased awareness of safety;Decreased awareness of deficits          Exercises      General Comments        Pertinent Vitals/Pain Pain Assessment: Faces Faces Pain Scale: Hurts  whole lot Pain Location: mostly c/o neck pain with mobility Pain Descriptors / Indicators: Aching;Discomfort Pain Intervention(s): Monitored during session;Premedicated before session;Repositioned;Limited activity within patient's tolerance    Home Living                      Prior Function            PT Goals (current goals can now be found in the care plan section) Progress towards PT goals: Progressing toward goals     Frequency  Min 3X/week    PT Plan Current plan remains appropriate    Co-evaluation             End of Session Equipment Utilized During Treatment: Cervical collar Activity Tolerance: Patient limited by pain Patient left: in bed;with call bell/phone within reach;with family/visitor present;with bed alarm set     Time: 1433-1450 PT Time Calculation (min) (ACUTE ONLY): 17 min  Charges:  $Therapeutic Activity: 8-22 mins                    G Codes:      Kathern Lobosco,KATHrine E Mar 05, 2014, 3:38 PM Carmelia Bake, PT, DPT 05-Mar-2014 Pager: [phone]

## 2014-02-03 NOTE — Progress Notes (Signed)
Key Points: Use following P&T approved IV to PO medication change policy.  Description contains the criteria that are approved Note: Policy Excludes:  Esophagectomy patientsPHARMACIST - PHYSICIAN COMMUNICATION DR:   Charlies Silvers CONCERNING: IV to Oral Route Change Policy  RECOMMENDATION: This patient is receiving Protonix and thiamine by the intravenous route.  Based on criteria approved by the Pharmacy and Therapeutics Committee, the intravenous medication(s) is/are being converted to the equivalent oral dose form(s).   DESCRIPTION: These criteria include:  The patient is eating (either orally or via tube) and/or has been taking other orally administered medications for a least 24 hours  The patient has no evidence of active gastrointestinal bleeding or impaired GI absorption (gastrectomy, short bowel, patient on TNA or NPO).  If you have questions about this conversion, please contact the Pharmacy Department  []   [phone] )  [facility] []   [phone] )  [facility]  []   [phone] )  [facility] [x]   [phone] )  [place], [place], [facility] 02/03/2014 11:03 AM

## 2014-02-04 ENCOUNTER — Encounter: Payer: Self-pay | Admitting: Internal Medicine

## 2014-02-04 ENCOUNTER — Inpatient Hospital Stay (HOSPITAL_COMMUNITY): Payer: Medicare Other

## 2014-02-04 DIAGNOSIS — S129XXS Fracture of neck, unspecified, sequela: Secondary | ICD-10-CM

## 2014-02-04 LAB — BASIC METABOLIC PANEL
Anion gap: 9 (ref 5–15)
CO2: 26 mmol/L (ref 19–32)
Calcium: 8 mg/dL — ABNORMAL LOW (ref 8.4–10.5)
Chloride: 88 mEq/L — ABNORMAL LOW (ref 96–112)
Creatinine, Ser: 0.45 mg/dL — ABNORMAL LOW (ref 0.50–1.10)
GFR calc Af Amer: >90 mL/min (ref >90)
Glucose, Bld: 197 mg/dL — ABNORMAL HIGH (ref 70–99)
POTASSIUM: 3.1 mmol/L — AB (ref 3.5–5.1)
Sodium: 123 mmol/L — ABNORMAL LOW (ref 135–145)

## 2014-02-04 LAB — CBC
HCT: 34.2 % — ABNORMAL LOW (ref 36.0–46.0)
Hemoglobin: 11.8 g/dL — ABNORMAL LOW (ref 12.0–15.0)
MCH: 31.6 pg (ref 26.0–34.0)
MCHC: 34.5 g/dL (ref 30.0–36.0)
MCV: 91.4 fL (ref 78.0–100.0)
PLATELETS: 188 10*3/uL (ref 150–400)
RBC: 3.74 MIL/uL — AB (ref 3.87–5.11)
RDW: 13 % (ref 11.5–15.5)
WBC: 9.3 10*3/uL (ref 4.0–10.5)

## 2014-02-04 LAB — GLUCOSE, CAPILLARY: Glucose-Capillary: 150 mg/dL — ABNORMAL HIGH (ref 70–99)

## 2014-02-04 MED ORDER — LOSARTAN POTASSIUM 50 MG PO TABS: 50.0000 mg | ORAL_TABLET | Freq: Every day | ORAL | Status: DC

## 2014-02-04 MED ORDER — POTASSIUM CHLORIDE CRYS ER 20 MEQ PO TBCR
40.0000 meq | EXTENDED_RELEASE_TABLET | Freq: Once | ORAL | Status: AC
  Administered 2014-02-04: 40 meq via ORAL
  Filled 2014-02-04: qty 2

## 2014-02-04 MED ORDER — LORAZEPAM 1 MG PO TABS: 1.0000 mg | ORAL_TABLET | Freq: Every day | ORAL | Status: DC

## 2014-02-04 MED ORDER — ESCITALOPRAM OXALATE 20 MG PO TABS: 20.0000 mg | ORAL_TABLET | Freq: Every day | ORAL | Status: DC

## 2014-02-04 MED ORDER — CYCLOBENZAPRINE HCL 10 MG PO TABS: 10.0000 mg | ORAL_TABLET | Freq: Three times a day (TID) | ORAL | Status: DC | PRN

## 2014-02-04 MED ORDER — PANTOPRAZOLE SODIUM 40 MG PO TBEC: 40.0000 mg | DELAYED_RELEASE_TABLET | Freq: Every day | ORAL | Status: DC

## 2014-02-04 MED ORDER — NALOXONE HCL 1 MG/ML IJ SOLN
1.0000 mg | Freq: Once | INTRAMUSCULAR | Status: AC
  Administered 2014-02-04: 1 mg via INTRAVENOUS
  Filled 2014-02-04: qty 2

## 2014-02-04 MED ORDER — THIAMINE HCL 100 MG PO TABS: 100.0000 mg | ORAL_TABLET | Freq: Every day | ORAL | Status: DC

## 2014-02-04 NOTE — Discharge Summary (Addendum)
Physician Discharge Summary  Hailey [name] PPJ:[id number] DOB: [birth year] DOA: 01/31/2014  PCP: Hailey Cobble, MD  Admit date: 01/31/2014 Discharge date: 02/04/2014  Recommendations for Outpatient Follow-up:  1. Please continue to check sodium level at least every week to make sure it remains stable. Sodium level prior to discharge is stable at 123. 2. Please note potassium was supplemented prior to discharge. Please check potassium per skilled nursing facility protocol and supplement as needed. 3. Creatinine is within normal limits prior to discharge.  Of note, I spoke with the patient's daughter over the phone who asked if patient can stay for additional 24 hours because she did not think that her mother will like to be in [facility] skilled nursing facility. I explained to her that I will speak with a Education officer, museum and see what else we can do but at this point patient is medically stable for discharge. She will most likely need to make a decision and if nothing else available [facility] will be to skilled nursing facility of choice.   Discharge Diagnoses:  Principal Problem:   Hyponatremia Active Problems:   HLD (hyperlipidemia)   Essential hypertension   GERD   INTERSTITIAL CYSTITIS   Alcohol abuse with intoxication   Fall   Cervical spine fracture   AKI (acute kidney injury)   SAH (subarachnoid hemorrhage)   Alcohol dependence with uncomplicated withdrawal    Discharge Condition: stable   Diet recommendation: as tolerated   History of present illness:  73 y.o. female the PMH of GERD, hypertension, hyperlipidemia, alcoholism who was admitted 01/31/14 after falling down. She was intoxicated on admission and admitted to drinking 4 glasses of wine prior to admission. She had mild facial trauma on admission. CT scan of the head and cervical spine showed a frontal scalp hematoma and a type II fracture of the body of the dens/Jefferson fracture of C1. She was also hyponatremic  with a sodium of 123.  Assessment/Plan:     Principal Problem:  Fall / Cervical spine fracture / facial lacerations/abrasions / SAH  Dr. Christella Noa (neurosurgeon) recommended a C-collar immobilizer and outpatient follow-up. Asked for a formal consult after repeat head CT done to evaluate persistent vomiting, new SAH noted. No further recommendations.   Follow up with Dr.Branch in [facility] who performed her cervical fusion 8 years ago.  Continue wound care to facial wounds.  PT evaluation done 02/01/14, SNF recommended.  Active Problems:  Hyponatremia  Likely due to alcohol abuse.   Spironolactone on hold.  Continue to recheck sodium level at least weekly to make sure it continues to be stable.    HLD (hyperlipidemia)  Continue Lipitor.   Essential hypertension  Continue verapamil. May resume Cozaar but at a lower dose 50 mg daily. Spironolactone is still on hold. It may be resumed once blood pressure more stable at 120/80 or above.    GERD  Continue Protonix.   INTERSTITIAL CYSTITIS  Urinalysis negative for UTI.   Alcohol abuse with intoxication  Counseled on cessation.   Psychiatry consult requested. Discontinued all meds  No reports of withdrawal. She is at high risk for overdose if she takes ativan and then drinks so will stop ativan as well.   AKI (acute kidney injury)  Likely prerenal. Holding Cozaar and spironolactone.  Creatinine now WNL.   DVT Prophylaxis  SCDs ordered.  Code Status: Full. Family Communication: Hailey [name] (daughter, [place]) by telephone [phone] (Note: Please call with updates daily).    IV access:  Peripheral IV  Procedures and diagnostic studies:    Ct Head / Ct Cervical SpineWo Contrast 02/01/2014: Frontal scalp hematoma without acute intracranial finding. Type 2 fracture of the body of the dens and Jefferson fracture of C1. Critical Value/emergent results were called by telephone at  the time of interpretation on 02/01/2014 at 12:30 am to Dr. Orlie Dakin , who verbally acknowledged these results.   Ct Head Wo Contrast 02/01/2014 ADDENDUM REPORT: 02/01/2014 15:38 ADDENDUM: Critical Value/emergent results were called by telephone at the time of interpretation on 02/01/2014 at 3:38 pm to Dr. Jacquelynn Cree , who verbally acknowledged these results. There is a relatively mild degree of subarachnoid hemorrhage currently in the left temporal lobe and to a lesser amount in the right temporal lobe. These findings are best appreciated on axial slices 10, 11, and 12, series 2 on the left than on the right on slice 19 series 2. There is stable atrophy with patchy small vessel disease. There is no subdural or epidural fluid. The fracture of the anterior aspect of C1 is incompletely visualized; please see CT report of the cervical spine from earlier in the day.   Dg Abd 2 Views 02/01/2014: Nonspecific abdomen. Rounded calcification in the right upper quadrant as described.  Medical Consultants:   Ashok Pall, neurosurgery  Other Consultants:   Physical therapy  IAnti-Infectives:    None   Signed:  Leisa Lenz, MD  [facility] 02/04/2014, 12:09 PM  Pager #: [phone]   Discharge Exam: Filed Vitals:   02/04/14 0606  BP: 115/90  Pulse: 98  Temp: 98.3 F (36.8 C)  Resp: 18   Filed Vitals:   02/03/14 0558 02/03/14 1852 02/03/14 2153 02/04/14 0606  BP: 166/88 148/77 169/91 115/90  Pulse: 92 90 102 98  Temp: 98.4 F (36.9 C) 98.3 F (36.8 C) 98.9 F (37.2 C) 98.3 F (36.8 C)  TempSrc: Oral Oral Oral Oral  Resp: 18 18 18 18   Height:      Weight:      SpO2: 94% 94% 94% 96%    General: Pt is alert, follows commands appropriately, not in acute distress; facial ecchymosis, neck collar (+)  Cardiovascular: Regular rate and rhythm, S1/S2 +, no murmurs Respiratory: Clear to auscultation bilaterally, no wheezing, no crackles, no  rhonchi Abdominal: Soft, non tender, non distended, bowel sounds +, no guarding Extremities: no cyanosis, pulses palpable bilaterally DP and PT Neuro: Grossly nonfocal  Discharge Instructions  Discharge Instructions    Call MD for:  difficulty breathing, headache or visual disturbances    Complete by:  As directed      Call MD for:  persistant dizziness or light-headedness    Complete by:  As directed      Call MD for:  persistant nausea and vomiting    Complete by:  As directed      Call MD for:  severe uncontrolled pain    Complete by:  As directed      Diet - low sodium heart healthy    Complete by:  As directed      Discharge instructions    Complete by:  As directed   1. Please continue to check sodium level at least every week to make sure it remains stable. Sodium level prior to discharge is stable at 123. 2. Please note potassium was supplemented prior to discharge. Please check potassium per skilled nursing facility protocol and supplement as needed. 3. Creatinine is within normal limits prior to discharge.  Increase activity slowly    Complete by:  As directed             Medication List    STOP taking these medications        aspirin 81 MG tablet     celecoxib 200 MG capsule  Commonly known as:  CELEBREX     ciprofloxacin 500 MG tablet  Commonly known as:  CIPRO     LORazepam 1 MG tablet  Commonly known as:  ATIVAN     naproxen 250 MG tablet  Commonly known as:  NAPROSYN     Oxycodone HCl 10 MG Tabs     senna 8.6 MG Tabs tablet  Commonly known as:  SENOKOT     spironolactone 25 MG tablet  Commonly known as:  ALDACTONE      TAKE these medications        ALIGN 4 MG Caps  Take 4 mg by mouth daily.     atorvastatin 40 MG tablet  Commonly known as:  LIPITOR  Take 40 mg by mouth daily.     CALCIUM + D PO  Take by mouth daily.     cholecalciferol 1000 UNITS tablet  Commonly known as:  VITAMIN D  Take 1,000 Units by mouth daily.      cyclobenzaprine 10 MG tablet  Commonly known as:  FLEXERIL  Take 1 tablet (10 mg total) by mouth 3 (three) times daily as needed for muscle spasms.     denosumab 60 MG/ML Soln injection  Commonly known as:  PROLIA  Inject 60 mg into the skin every 6 (six) months. Administer in upper arm, thigh, or abdomen     EPIPEN 2-PAK 0.3 mg/0.3 mL Soaj injection  Generic drug:  EPINEPHrine  Inject 0.3 mg into the muscle daily as needed. Allergic reaction     escitalopram 20 MG tablet  Commonly known as:  LEXAPRO  Take 1 tablet (20 mg total) by mouth daily.     esomeprazole 40 MG capsule  Commonly known as:  NEXIUM  Take 40 mg by mouth daily at 12 noon.     ferrous sulfate 325 (65 FE) MG tablet  Take 325 mg by mouth daily with breakfast.     fexofenadine 180 MG tablet  Commonly known as:  ALLEGRA  Take 180 mg by mouth daily.     FORTEO White [place]  Inject into the skin every 6 (six) months.     lidocaine 5 %  Commonly known as:  LIDODERM  Place 1 patch onto the skin daily. Remove & Discard patch within 12 hours or as directed by MD. Apply over left lateral rib     losartan 50 MG tablet  Commonly known as:  COZAAR  Take 1 tablet (50 mg total) by mouth daily.     MULTIVITAMIN PO  Take 1 tablet by mouth daily.     OSTEO BI-FLEX TRIPLE STRENGTH PO  Take 1 tablet by mouth 2 (two) times daily.     pantoprazole 40 MG tablet  Commonly known as:  PROTONIX  Take 1 tablet (40 mg total) by mouth daily.     thiamine 100 MG tablet  Take 1 tablet (100 mg total) by mouth daily.     verapamil 180 MG CR tablet  Commonly known as:  CALAN-SR  TAKE ONE TABLET AT BEDTIME.     vitamin C 100 MG tablet  Take 100 mg by mouth daily.  Follow-up Information    Follow up with Hailey Cobble, MD. Schedule an appointment as soon as possible for a visit in 2 weeks.   Specialty:  Internal Medicine   Why:  Follow up appt after recent hospitalization   Contact information:   [address] [address] [phone]        The results of significant diagnostics from this hospitalization (including imaging, microbiology, ancillary and laboratory) are listed below for reference.    Significant Diagnostic Studies: Ct Head Wo Contrast  02/04/2014   CLINICAL DATA:  Sudden change in mental status this morning. Recent fall. Initial encounter.  EXAM: CT HEAD WITHOUT CONTRAST  TECHNIQUE: Contiguous axial images were obtained from the base of the skull through the vertex without intravenous contrast.  COMPARISON:  CT of the head performed 02/01/2014  FINDINGS: There is no evidence of acute infarction, mass lesion, or intra- or extra-axial hemorrhage on CT.  Prominence of the ventricles and sulci reflects mild cortical volume loss. A chronic lacunar infarct is noted at the right cerebellar hemisphere. Scattered periventricular and subcortical white matter change likely reflects small vessel ischemic microangiopathy.  The brainstem and fourth ventricle are within normal limits. The basal ganglia are unremarkable in appearance. The cerebral hemispheres demonstrate grossly normal gray-white differentiation. No mass effect or midline shift is seen.  There is no evidence of fracture; visualized osseous structures are unremarkable in appearance. The orbits are within normal limits. The paranasal sinuses and mastoid air cells are well-aerated. A soft tissue laceration is noted overlying the frontal calvarium, with associated soft tissue swelling.  IMPRESSION: 1. No acute intracranial pathology seen on CT. 2. Soft tissue laceration overlying the frontal calvarium, with associated soft tissue swelling. 3. Mild cortical volume loss and scattered small vessel ischemic microangiopathy. 4. 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   Electronically Signed   By: Garald Balding M.D.   On: 02/04/2014 04:50   Ct Head Wo Contrast  02/01/2014   ADDENDUM REPORT: 02/01/2014 15:38   ADDENDUM: Critical Value/emergent results were called by telephone at the time of interpretation on 02/01/2014 at 3:38 pm to Dr. Jacquelynn Cree , who verbally acknowledged these results.   Electronically Signed   By: Lowella Grip M.D.   On: 02/01/2014 15:38   02/01/2014   CLINICAL DATA:  Patient fell 1 day prior with frontal hematoma. Projectile vomiting  EXAM: CT HEAD WITHOUT CONTRAST  TECHNIQUE: Contiguous axial images were obtained from the base of the skull through the vertex without intravenous contrast.  COMPARISON:  February 01, 2014 study obtained earlier in the day  FINDINGS: There is mild diffuse atrophy with somewhat greater frontal atrophy than elsewhere. There is a mild degree of subarachnoid hemorrhage in the anterior to mid left temporal lobe. A minimal focus of subarachnoid hemorrhage is also noted in the superior left temporal lobe on slice 19 series 2. No other appreciable subarachnoid hemorrhage is identified. There is no mass effect. There is no appreciable subdural or epidural fluid. No acute infarct is seen. There is patchy small vessel disease in the centra semiovale bilaterally.  There is a frontal scalp hematoma, stable.  No skull fracture is seen. Mastoid air cells are clear. Fracture of the anterior aspect of C1, slightly displaced, is again noted, but incompletely visualized. The patient has known C1 and C2 fractures from CT of the cervical spine obtained earlier in the day.  IMPRESSION: There is a relatively mild degree of subarachnoid hemorrhage currently in  the left temporal lobe and to a lesser amount in the right temporal lobe. These findings are best appreciated on axial slices 10, 11, and 12, series 2 on the left than on the right on slice 19 series 2. There is stable atrophy with patchy small vessel disease. There is no subdural or epidural fluid. The fracture of the anterior aspect of C1 is incompletely visualized; please see CT report of the cervical spine from earlier in  the day.  Electronically Signed: By: Lowella Grip M.D. On: 02/01/2014 15:22   Ct Head Wo Contrast  02/01/2014   CLINICAL DATA:  Fall, neck pain, frontal hematoma  EXAM: CT HEAD WITHOUT CONTRAST  CT CERVICAL SPINE WITHOUT CONTRAST  TECHNIQUE: Multidetector CT imaging of the head and cervical spine was performed following the standard protocol without intravenous contrast. Multiplanar CT image reconstructions of the cervical spine were also generated.  COMPARISON:  Head CT 11/17/2013  FINDINGS: CT HEAD FINDINGS  Frontal scalp hematoma is identified. No underlying skull fracture. Orbits and paranasal sinuses are grossly unremarkable. Mild cortical volume loss noted with proportional ventricular prominence. Areas of periventricular white matter hypodensity are most compatible with small vessel ischemic change. No acute hemorrhage, infarct, or mass lesion is identified. No midline shift.  CT CERVICAL SPINE FINDINGS  C1 through the cervicothoracic junction is visualized in its entirety. There is a type 2 fracture through the body of C2. Fractures through the bilateral lamina of C1 and anterior aspect of the C1 ring are identified. Evidence of previous posterior fusion spanning C4-C7 without evidence for hardware failure. Vertebral body heights are preserved. Moderate multi-level mid/inferior cervical spine disc degenerative change noted. Lung apices are clear.  IMPRESSION: Frontal scalp hematoma without acute intracranial finding.  Type 2 fracture of the body of the dens and Jefferson fracture of C1.  Critical Value/emergent results were called by telephone at the time of interpretation on 02/01/2014 at 12:30 am to Dr. Orlie Dakin , who verbally acknowledged these results.   Electronically Signed   By: Conchita Paris M.D.   On: 02/01/2014 00:33   Ct Cervical Spine Wo Contrast  02/01/2014   CLINICAL DATA:  Fall, neck pain, frontal hematoma  EXAM: CT HEAD WITHOUT CONTRAST  CT CERVICAL SPINE WITHOUT CONTRAST   TECHNIQUE: Multidetector CT imaging of the head and cervical spine was performed following the standard protocol without intravenous contrast. Multiplanar CT image reconstructions of the cervical spine were also generated.  COMPARISON:  Head CT 11/17/2013  FINDINGS: CT HEAD FINDINGS  Frontal scalp hematoma is identified. No underlying skull fracture. Orbits and paranasal sinuses are grossly unremarkable. Mild cortical volume loss noted with proportional ventricular prominence. Areas of periventricular white matter hypodensity are most compatible with small vessel ischemic change. No acute hemorrhage, infarct, or mass lesion is identified. No midline shift.  CT CERVICAL SPINE FINDINGS  C1 through the cervicothoracic junction is visualized in its entirety. There is a type 2 fracture through the body of C2. Fractures through the bilateral lamina of C1 and anterior aspect of the C1 ring are identified. Evidence of previous posterior fusion spanning C4-C7 without evidence for hardware failure. Vertebral body heights are preserved. Moderate multi-level mid/inferior cervical spine disc degenerative change noted. Lung apices are clear.  IMPRESSION: Frontal scalp hematoma without acute intracranial finding.  Type 2 fracture of the body of the dens and Jefferson fracture of C1.  Critical Value/emergent results were called by telephone at the time of interpretation on 02/01/2014 at 12:30 am to Dr.  SAM JACUBOWITZ , who verbally acknowledged these results.   Electronically Signed   By: Conchita Paris M.D.   On: 02/01/2014 00:33   Dg Abd 2 Views  02/01/2014   CLINICAL DATA:  Vomiting for 1 day  EXAM: ABDOMEN - 2 VIEW  COMPARISON:  08/17/2011, 09/22/2004  FINDINGS: Postoperative changes are noted in the lumbar spine which are stable in appearance. No free air is seen. A nonobstructive bowel gas pattern is noted. There is a rounded calcification in the right upper quadrant which has progressed in the interval from prior exam.  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 The possibility of cholelithiasis deserve consideration as well. No other focal abnormality is seen  IMPRESSION: Nonspecific abdomen.  Rounded calcification in the right upper quadrant as described.   Electronically Signed   By: Inez Catalina M.D.   On: 02/01/2014 16:00    Microbiology: No results found for this or any previous visit (from the past 240 hour(s)).   Labs: Basic Metabolic Panel:  Recent Labs Lab 01/31/14 2257 02/01/14 0535 02/02/14 0900 02/04/14 0805  NA 123* 124* 123* 123*  K 4.5 3.9 3.5 3.1*  CL 89* 85* 87* 88*  CO2  --  23 25 26   GLUCOSE 122* 145* 152* 197*  BUN 14 10 6  <5*  CREATININE 1.20* 0.54 0.49* 0.45*  CALCIUM  --  8.5 7.8* 8.0*   Liver Function Tests:  Recent Labs Lab 02/01/14 0535  AST 34  ALT 21  ALKPHOS 51  BILITOT 0.3  PROT 6.9  ALBUMIN 4.3   No results for input(s): LIPASE, AMYLASE in the last 168 hours. No results for input(s): AMMONIA in the last 168 hours. CBC:  Recent Labs Lab 01/31/14 2257 02/01/14 0032 02/01/14 0535 02/04/14 0805  WBC  --  8.9 8.7 9.3  NEUTROABS  --  7.2  --   --   HGB 13.3 11.5* 12.0 11.8*  HCT 39.0 34.3* 35.6* 34.2*  MCV  --  93.0 92.0 91.4  PLT  --  213 212 188   Cardiac Enzymes: No results for input(s): CKTOTAL, CKMB, CKMBINDEX, TROPONINI in the last 168 hours. BNP: BNP (last 3 results) No results for input(s): PROBNP in the last 8760 hours. CBG:  Recent Labs Lab 02/02/14 0742 02/03/14 0740 02/04/14 0749  GLUCAP 131* 192* 150*    Time coordinating discharge: Over 30 minutes

## 2014-02-04 NOTE — Progress Notes (Signed)
PT Cancellation Note  Patient Details Name: Hailey [name] MRN: [id number] DOB: 21-Jun-1940   Cancelled Treatment:    Reason Eval/Treat Not Completed: Medical issues which prohibited therapy (hold PT per RN due to onset of confusion last night. Neuro consult pending. )   Philomena Doheny 02/04/2014, 12:11 PM  [phone]

## 2014-02-04 NOTE — Progress Notes (Signed)
Patient is set to discharge to [facility] today. Patient & daughter, Hailey [name] aware. Discharge packet given to RN, Melissa. [facility] called for transport.   Clinical Social Work Department CLINICAL SOCIAL WORK PLACEMENT NOTE 02/04/2014  Patient:  Hailey [name], Hailey [name]  Account Number:  [id number] Admit date:  01/31/2014  Clinical Social Worker:  Renold Genta  Date/time:  02/04/2014 12:23 PM  Clinical Social Work is seeking post-discharge placement for this patient at the following level of care:   SKILLED NURSING   (*CSW will update this form in Epic as items are completed)   02/03/2014  Patient/family provided with [place] Department of Clinical Social Work's list of facilities offering this level of care within the geographic area requested by the patient (or if unable, by the patient's family).  02/03/2014  Patient/family informed of their freedom to choose among providers that offer the needed level of care, that participate in Medicare, Medicaid or managed care program needed by the patient, have an available bed and are willing to accept the patient.  02/03/2014  Patient/family informed of [facility]' ownership interest in [facility], as well as of the fact that they are under no obligation to receive care at this facility.  PASARR submitted to EDS on 02/03/2014 PASARR number received on 02/03/2014  FL2 transmitted to all facilities in geographic area requested by pt/family on  02/03/2014 FL2 transmitted to all facilities within larger geographic area on   Patient informed that his/her managed care company has contracts with or will negotiate with  certain facilities, including the following:     Patient/family informed of bed offers received:  02/03/2014 Patient chooses bed at [facility] Physician recommends and patient chooses bed at    Patient to be transferred to [facility] on  02/04/2014 Patient to be transferred to facility by [facility] Patient and family  notified of transfer on 02/04/2014 Name of family member notified:  patient's daughter, Hailey [name] via phone  The following physician request were entered in Epic:   Additional Comments:   Raynaldo Opitz, [place] Social Worker cell #: [phone]

## 2014-02-04 NOTE — Discharge Instructions (Signed)
Hyponatremia  °Hyponatremia is when the amount of salt (sodium) in your blood is too low. When sodium levels are low, your cells will absorb extra water and swell. The swelling happens throughout the body, but it mostly affects the brain. Severe brain swelling (cerebral edema), seizures, or coma can happen.  °CAUSES  °· Heart, kidney, or liver problems. °· Thyroid problems. °· Adrenal gland problems. °· Severe vomiting and diarrhea. °· Certain medicines or illegal drugs. °· Dehydration. °· Drinking too much water. °· Low-sodium diet. °SYMPTOMS  °· Nausea and vomiting. °· Confusion. °· Lethargy. °· Agitation. °· Headache. °· Twitching or shaking (seizures). °· Unconsciousness. °· Appetite loss. °· Muscle weakness and cramping. °DIAGNOSIS  °Hyponatremia is identified by a simple blood test. Your caregiver will perform a history and physical exam to try to find the cause and type of hyponatremia. Other tests may be needed to measure the amount of sodium in your blood and urine. °TREATMENT  °Treatment will depend on the cause.  °· Fluids may be given through the vein (IV). °· Medicines may be used to correct the sodium imbalance. If medicines are causing the problem, they will need to be adjusted. °· Water or fluid intake may be restricted to restore proper balance. °The speed of correcting the sodium problem is very important. If the problem is corrected too fast, nerve damage (sometimes unchangeable) can happen. °HOME CARE INSTRUCTIONS  °· Only take medicines as directed by your caregiver. Many medicines can make hyponatremia worse. Discuss all your medicines with your caregiver. °· Carefully follow any recommended diet, including any fluid restrictions. °· You may be asked to repeat lab tests. Follow these directions. °· Avoid alcohol and recreational drugs. °SEEK MEDICAL CARE IF:  °· You develop worsening nausea, fatigue, headache, confusion, or weakness. °· Your original hyponatremia symptoms return. °· You have  problems following the recommended diet. °SEEK IMMEDIATE MEDICAL CARE IF:  °· You have a seizure. °· You faint. °· You have ongoing diarrhea or vomiting. °MAKE SURE YOU:  °· Understand these instructions. °· Will watch your condition. °· Will get help right away if you are not doing well or get worse. °Document Released: 01/19/2002 Document Revised: 04/23/2011 Document Reviewed: 07/16/2010 °Exit[employer]® Patient Information ©2015 [employer]. This information is not intended to replace advice given to you by your health care provider. Make sure you discuss any questions you have with your health care provider. ° °

## 2014-02-04 NOTE — Progress Notes (Signed)
OT Cancellation Note  Patient Details Name: Hailey [name] MRN: [id number] DOB: Jun 05, 1940   Cancelled Treatment:    Reason Eval/Treat Not Completed: Other (comment) Per nursing, hold on OT right now. Neurosurgery coming by today. Pt with episode of confusion last night.  Jules Schick  [phone] 02/04/2014, 9:18 AM

## 2014-02-04 NOTE — Progress Notes (Signed)
Pt became confused around 0100, alert to self and time only. Notified hospitalist and contacted on-call neurosurgery. Received order for repeat CT and d/c of morphine; administered Narcan as ordered. Pt became oriented x4 after Narcan administration, brief periods of confusion per aide at bedside. New CT completed and results in chart.

## 2014-02-04 NOTE — Progress Notes (Signed)
Report called to [facility] to [place]. Pt to be transported via [facility] with AVS packet.

## 2014-02-08 ENCOUNTER — Other Ambulatory Visit: Payer: Self-pay

## 2014-02-08 ENCOUNTER — Other Ambulatory Visit: Payer: Self-pay | Admitting: *Deleted

## 2014-02-08 MED ORDER — LORAZEPAM 1 MG PO TABS: 1.0000 mg | ORAL_TABLET | Freq: Every day | ORAL | Status: DC

## 2014-02-08 NOTE — Telephone Encounter (Signed)
Rx faxed to [facility] @ [phone], phone number [phone]  

## 2014-02-09 ENCOUNTER — Encounter: Payer: Self-pay | Admitting: Adult Health

## 2014-02-09 ENCOUNTER — Non-Acute Institutional Stay (SKILLED_NURSING_FACILITY): Payer: Medicare Other | Admitting: Adult Health

## 2014-02-09 DIAGNOSIS — F10230 Alcohol dependence with withdrawal, uncomplicated: Secondary | ICD-10-CM

## 2014-02-09 DIAGNOSIS — F329 Major depressive disorder, single episode, unspecified: Secondary | ICD-10-CM

## 2014-02-09 DIAGNOSIS — S129XXS Fracture of neck, unspecified, sequela: Secondary | ICD-10-CM

## 2014-02-09 DIAGNOSIS — E785 Hyperlipidemia, unspecified: Secondary | ICD-10-CM

## 2014-02-09 DIAGNOSIS — I609 Nontraumatic subarachnoid hemorrhage, unspecified: Secondary | ICD-10-CM

## 2014-02-09 DIAGNOSIS — F32A Depression, unspecified: Secondary | ICD-10-CM

## 2014-02-09 DIAGNOSIS — W19XXXD Unspecified fall, subsequent encounter: Secondary | ICD-10-CM

## 2014-02-09 DIAGNOSIS — I10 Essential (primary) hypertension: Secondary | ICD-10-CM

## 2014-02-09 DIAGNOSIS — K2100 Gastro-esophageal reflux disease with esophagitis, without bleeding: Secondary | ICD-10-CM

## 2014-02-09 DIAGNOSIS — D509 Iron deficiency anemia, unspecified: Secondary | ICD-10-CM

## 2014-02-09 DIAGNOSIS — E871 Hypo-osmolality and hyponatremia: Secondary | ICD-10-CM

## 2014-02-09 NOTE — Progress Notes (Signed)
Patient ID: Hailey [name], female   DOB: Feb 04, 1941, 73 y.o.   MRN: [id number]   02/09/2014  Facility:  Nursing Home Location:  [place] Room Number: 705-P LEVEL OF CARE:  SNF (73)   Chief Complaint  Patient presents with  . Hospitalization Follow-up    Physical deconditioning, cervical spine fracture, hyponatremia, hyperlipidemia, hypertension, GERD and alcohol dependence    HISTORY OF PRESENT ILLNESS:  This is a 73 year old female who has been admitted to [facility] on 02/04/14 from [facility]. She has past medical history of GERD, hypertension, hyperlipidemia and alcoholism. Patient had a fall due to being intoxicated with alcohol. She admitted to drinking 4 glasses of wine prior to fall. CT scan showed a frontal scalp hematoma and C1 fracture. She was also found to be hyponatremic. Patient has been admitted for a short-term rehabilitation.   PAST MEDICAL HISTORY:  Past Medical History  Diagnosis Date  . OSA (obstructive sleep apnea)   . Closed fracture of rib(s), unspecified   . 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 Esophageal reflux   . Osteoarthrosis, unspecified whether generalized or localized, unspecified site   . Chronic interstitial cystitis   . Personal history of other disorders of nervous system and sense organs   . Unspecified closed fracture of pelvis     x 2 2004/2006  . Disorder of bone and cartilage, unspecified   . Unspecified essential hypertension   . Other and unspecified hyperlipidemia   . Allergic rhinitis, cause unspecified   . Chronic insomnia   . Personal history of colonic adenomas 11/28/2012  . SAH (subarachnoid hemorrhage) 02/02/2014  . Cervical spine fracture 02/01/2014    CURRENT MEDICATIONS: Reviewed per MAR/see medication list  Allergies  Allergen Reactions  . Resorcinol Swelling and Other (See Comments)    blisters  . Zolpidem     automated behavior as  wandering     REVIEW OF SYSTEMS:  GENERAL: no change in appetite, no fatigue, no weight changes, no fever, chills or weakness RESPIRATORY: no cough, SOB, DOE, wheezing, hemoptysis CARDIAC: no chest pain, edema or palpitations GI: no abdominal pain, diarrhea, constipation, heart burn  PHYSICAL EXAMINATION  GENERAL: no acute distress, normal body habitus SKIN:  Abrasion on forehead and under left nostril; bruising on face EYES: conjunctivae normal, sclerae normal, normal eye lids NECK: supple, trachea midline, no neck masses, no thyroid tenderness, no thyromegaly LYMPHATICS: no LAN in the neck, no supraclavicular LAN RESPIRATORY: breathing is even & unlabored, BS CTAB CARDIAC: RRR, no murmur,no extra heart sounds, no edema GI: abdomen soft, normal BS, no masses, no tenderness, no hepatomegaly, no splenomegaly EXTREMITIES: Able to move 4 extremities; has C-colllar PSYCHIATRIC: the patient is alert & oriented to person, affect & behavior appropriate  LABS/RADIOLOGY: Labs reviewed: Basic Metabolic Panel:  Recent Labs  02/01/14 0535 02/02/14 0900 02/04/14 0805  NA 124* 123* 123*  K 3.9 3.5 3.1*  CL 85* 87* 88*  CO2 23 25 26   GLUCOSE 145* 152* 197*  BUN 10 6 <5*  CREATININE 0.54 0.49* 0.45*  CALCIUM 8.5 7.8* 8.0*   Liver Function Tests:  Recent Labs  11/18/13 1023 02/01/14 0535  AST 16 34  ALT 11 21  ALKPHOS 40 51  BILITOT 0.2* 0.3  PROT 6.5 6.9  ALBUMIN 4.0 4.3    CBC:  Recent Labs  11/17/13 2055  02/01/14 0032 02/01/14 0535 02/04/14 0805  WBC 6.2  < >  8.9 8.7 9.3  NEUTROABS 3.5  --  7.2  --   --   HGB 11.0*  < > 11.5* 12.0 11.8*  HCT 33.2*  < > 34.3* 35.6* 34.2*  MCV 94.1  < > 93.0 92.0 91.4  PLT 217  < > 213 212 188  < > = values in this interval not displayed. CBG:  Recent Labs  02/02/14 0742 02/03/14 0740 02/04/14 0749  GLUCAP 131* 192* 150*    Ct Head Wo Contrast  02/04/2014   CLINICAL DATA:  Sudden change in mental status this  morning. Recent fall. Initial encounter.  EXAM: CT HEAD WITHOUT CONTRAST  TECHNIQUE: Contiguous axial images were obtained from the base of the skull through the vertex without intravenous contrast.  COMPARISON:  CT of the head performed 02/01/2014  FINDINGS: There is no evidence of acute infarction, mass lesion, or intra- or extra-axial hemorrhage on CT.  Prominence of the ventricles and sulci reflects mild cortical volume loss. A chronic lacunar infarct is noted at the right cerebellar hemisphere. Scattered periventricular and subcortical white matter change likely reflects small vessel ischemic microangiopathy.  The brainstem and fourth ventricle are within normal limits. The basal ganglia are unremarkable in appearance. The cerebral hemispheres demonstrate grossly normal gray-white differentiation. No mass effect or midline shift is seen.  There is no evidence of fracture; visualized osseous structures are unremarkable in appearance. The orbits are within normal limits. The paranasal sinuses and mastoid air cells are well-aerated. A soft tissue laceration is noted overlying the frontal calvarium, with associated soft tissue swelling.  IMPRESSION: 1. No acute intracranial pathology seen on CT. 2. Soft tissue laceration overlying the frontal calvarium, with associated soft tissue swelling. 3. Mild cortical volume loss and scattered small vessel ischemic microangiopathy. 4. 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   Electronically Signed   By: Garald Balding M.D.   On: 02/04/2014 04:50   Ct Head Wo Contrast  02/01/2014   ADDENDUM REPORT: 02/01/2014 15:38  ADDENDUM: Critical Value/emergent results were called by telephone at the time of interpretation on 02/01/2014 at 3:38 pm to Dr. Jacquelynn Cree , who verbally acknowledged these results.   Electronically Signed   By: Lowella Grip M.D.   On: 02/01/2014 15:38   02/01/2014   CLINICAL DATA:  Patient fell 1 day prior with frontal hematoma.  Projectile vomiting  EXAM: CT HEAD WITHOUT CONTRAST  TECHNIQUE: Contiguous axial images were obtained from the base of the skull through the vertex without intravenous contrast.  COMPARISON:  February 01, 2014 study obtained earlier in the day  FINDINGS: There is mild diffuse atrophy with somewhat greater frontal atrophy than elsewhere. There is a mild degree of subarachnoid hemorrhage in the anterior to mid left temporal lobe. A minimal focus of subarachnoid hemorrhage is also noted in the superior left temporal lobe on slice 19 series 2. No other appreciable subarachnoid hemorrhage is identified. There is no mass effect. There is no appreciable subdural or epidural fluid. No acute infarct is seen. There is patchy small vessel disease in the centra semiovale bilaterally.  There is a frontal scalp hematoma, stable.  No skull fracture is seen. Mastoid air cells are clear. Fracture of the anterior aspect of C1, slightly displaced, is again noted, but incompletely visualized. The patient has known C1 and C2 fractures from CT of the cervical spine obtained earlier in the day.  IMPRESSION: There is a relatively mild degree of subarachnoid hemorrhage currently in the left temporal lobe and  to a lesser amount in the right temporal lobe. These findings are best appreciated on axial slices 10, 11, and 12, series 2 on the left than on the right on slice 19 series 2. There is stable atrophy with patchy small vessel disease. There is no subdural or epidural fluid. The fracture of the anterior aspect of C1 is incompletely visualized; please see CT report of the cervical spine from earlier in the day.  Electronically Signed: By: Lowella Grip M.D. On: 02/01/2014 15:22   Ct Head Wo Contrast  02/01/2014   CLINICAL DATA:  Fall, neck pain, frontal hematoma  EXAM: CT HEAD WITHOUT CONTRAST  CT CERVICAL SPINE WITHOUT CONTRAST  TECHNIQUE: Multidetector CT imaging of the head and cervical spine was performed following the standard  protocol without intravenous contrast. Multiplanar CT image reconstructions of the cervical spine were also generated.  COMPARISON:  Head CT 11/17/2013  FINDINGS: CT HEAD FINDINGS  Frontal scalp hematoma is identified. No underlying skull fracture. Orbits and paranasal sinuses are grossly unremarkable. Mild cortical volume loss noted with proportional ventricular prominence. Areas of periventricular white matter hypodensity are most compatible with small vessel ischemic change. No acute hemorrhage, infarct, or mass lesion is identified. No midline shift.  CT CERVICAL SPINE FINDINGS  C1 through the cervicothoracic junction is visualized in its entirety. There is a type 2 fracture through the body of C2. Fractures through the bilateral lamina of C1 and anterior aspect of the C1 ring are identified. Evidence of previous posterior fusion spanning C4-C7 without evidence for hardware failure. Vertebral body heights are preserved. Moderate multi-level mid/inferior cervical spine disc degenerative change noted. Lung apices are clear.  IMPRESSION: Frontal scalp hematoma without acute intracranial finding.  Type 2 fracture of the body of the dens and Jefferson fracture of C1.  Critical Value/emergent results were called by telephone at the time of interpretation on 02/01/2014 at 12:30 am to Dr. Orlie Dakin , who verbally acknowledged these results.   Electronically Signed   By: Conchita Paris M.D.   On: 02/01/2014 00:33   Ct Cervical Spine Wo Contrast  02/01/2014   CLINICAL DATA:  Fall, neck pain, frontal hematoma  EXAM: CT HEAD WITHOUT CONTRAST  CT CERVICAL SPINE WITHOUT CONTRAST  TECHNIQUE: Multidetector CT imaging of the head and cervical spine was performed following the standard protocol without intravenous contrast. Multiplanar CT image reconstructions of the cervical spine were also generated.  COMPARISON:  Head CT 11/17/2013  FINDINGS: CT HEAD FINDINGS  Frontal scalp hematoma is identified. No underlying skull  fracture. Orbits and paranasal sinuses are grossly unremarkable. Mild cortical volume loss noted with proportional ventricular prominence. Areas of periventricular white matter hypodensity are most compatible with small vessel ischemic change. No acute hemorrhage, infarct, or mass lesion is identified. No midline shift.  CT CERVICAL SPINE FINDINGS  C1 through the cervicothoracic junction is visualized in its entirety. There is a type 2 fracture through the body of C2. Fractures through the bilateral lamina of C1 and anterior aspect of the C1 ring are identified. Evidence of previous posterior fusion spanning C4-C7 without evidence for hardware failure. Vertebral body heights are preserved. Moderate multi-level mid/inferior cervical spine disc degenerative change noted. Lung apices are clear.  IMPRESSION: Frontal scalp hematoma without acute intracranial finding.  Type 2 fracture of the body of the dens and Jefferson fracture of C1.  Critical Value/emergent results were called by telephone at the time of interpretation on 02/01/2014 at 12:30 am to Dr. Orlie Dakin , who verbally  acknowledged these results.   Electronically Signed   By: Conchita Paris M.D.   On: 02/01/2014 00:33   Dg Abd 2 Views  02/01/2014   CLINICAL DATA:  Vomiting for 1 day  EXAM: ABDOMEN - 2 VIEW  COMPARISON:  08/17/2011, 09/22/2004  FINDINGS: Postoperative changes are noted in the lumbar spine which are stable in appearance. No free air is seen. A nonobstructive bowel gas pattern is noted. There is a rounded calcification in the right upper quadrant which has progressed in the interval from prior exam. 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 The possibility of cholelithiasis deserve consideration as well. No other focal abnormality is seen  IMPRESSION: Nonspecific abdomen.  Rounded calcification in the right upper quadrant as described.   Electronically Signed   By: Inez Catalina M.D.   On:  02/01/2014 16:00    ASSESSMENT/PLAN:  Fall/cervical spine fractures/facial abrasion/SAH - neurosurgeon recommended C-collar immobilizer and outpatient follow-up Hyponatremia - likely due to alcohol abuse; hold Spiriva lactone; BMP weekly Hyperlipidemia - continue Lipitor 40 mg by mouth daily Essential hypertension - continue verapamil 180 mg by mouth daily at bedtime and start Cozaar 50 mg by mouth daily; Spironolactone on hold; BP every shift 1 GERD - continue Protonix 40 mg by mouth daily and discontinue Nexium Alcohol dependence with uncomplicated withdrawal - stable; continue Ativan 1 mg by mouth daily at bedtime Depression - mood is stable; continue Lexapro 20 mg by mouth daily Anemia, iron deficiency - hemoglobin 11.8; discontinue ferrous sulfate   Goals of care:  Short-term rehabilitation   Labs/test ordered:  CBC in 1 week; BMP weekly   Spent 50 minutes in patient care.    [facility], NP [employer] [phone]

## 2014-02-10 ENCOUNTER — Non-Acute Institutional Stay (SKILLED_NURSING_FACILITY): Payer: Medicare Other | Admitting: Internal Medicine

## 2014-02-10 DIAGNOSIS — E871 Hypo-osmolality and hyponatremia: Secondary | ICD-10-CM

## 2014-02-10 DIAGNOSIS — K2100 Gastro-esophageal reflux disease with esophagitis, without bleeding: Secondary | ICD-10-CM

## 2014-02-10 DIAGNOSIS — F32A Depression, unspecified: Secondary | ICD-10-CM

## 2014-02-10 DIAGNOSIS — I10 Essential (primary) hypertension: Secondary | ICD-10-CM | POA: Diagnosis not present

## 2014-02-10 DIAGNOSIS — R5381 Other malaise: Secondary | ICD-10-CM

## 2014-02-10 DIAGNOSIS — D638 Anemia in other chronic diseases classified elsewhere: Secondary | ICD-10-CM | POA: Diagnosis not present

## 2014-02-10 DIAGNOSIS — F329 Major depressive disorder, single episode, unspecified: Secondary | ICD-10-CM | POA: Diagnosis not present

## 2014-02-10 DIAGNOSIS — S129XXS Fracture of neck, unspecified, sequela: Secondary | ICD-10-CM | POA: Diagnosis not present

## 2014-02-10 DIAGNOSIS — I609 Nontraumatic subarachnoid hemorrhage, unspecified: Secondary | ICD-10-CM | POA: Diagnosis not present

## 2014-02-10 DIAGNOSIS — N390 Urinary tract infection, site not specified: Secondary | ICD-10-CM | POA: Diagnosis not present

## 2014-02-10 NOTE — Progress Notes (Signed)
Patient ID: Hailey [name], female   DOB: 04/11/1940, 73 y.o.   MRN: [id number]     [facility]  Chief Complaint  Patient presents with  . New Admit To SNF   Allergies  Allergen Reactions  . Resorcinol Swelling and Other (See Comments)    blisters  . Zolpidem     automated behavior as wandering   HPI 73 y/o female patient is here for STR post hospital admission from 01/31/14- 02/04/14 with a fall. She had facial trauma, frontal scalp hematoma and C1 fracture with hyponatremia. Neurosurgery was consulted. Repeat ct head showed SAH. Conservative management was recommended. Her spironolactone was help with low sodium level. She also had ARF and her cozaar was held. She has pmh of HTN, HLD, GERD and alcohol use. She is seen in her room today. She has cervical collar round her neck. She denies any concerns. She has been working with therapy team. No new concern from staff.  Review of Systems  Constitutional: Negative for fever, chills, diaphoresis.  HENT: Negative for congestion   Eyes: Negative for blurred vision, double vision and discharge.  Respiratory: Negative for cough, shortness of breath and wheezing.   Cardiovascular: Negative for chest pain, palpitations, leg swelling.  Gastrointestinal: Negative for heartburn, nausea, vomiting, abdominal pain, constipation.  Genitourinary: Negative for dysuria, flank pain.  Musculoskeletal: Negative for back pain, falls Skin: Negative for itching and rash.  Neurological: Positive for weakness. Negative for dizziness and headaches.  Psychiatric/Behavioral: Negative for depression   Past Medical History  Diagnosis Date  . OSA (obstructive sleep apnea)   . Closed fracture of rib(s), unspecified   . 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 Esophageal reflux   . Osteoarthrosis, unspecified whether generalized or localized, unspecified site   . Chronic interstitial cystitis   .  Personal history of other disorders of nervous system and sense organs   . Unspecified closed fracture of pelvis     x 2 2004/2006  . Disorder of bone and cartilage, unspecified   . Unspecified essential hypertension   . Other and unspecified hyperlipidemia   . Allergic rhinitis, cause unspecified   . Chronic insomnia   . Personal history of colonic adenomas 11/28/2012  . SAH (subarachnoid hemorrhage) 02/02/2014  . Cervical spine fracture 02/01/2014   Past Surgical History  Procedure Laterality Date  . Back surgery      ; has  had 8 back surgeries  . Cystoscopy    . Cervical fusion      C4-7  . Lumbar fusion      L2-3  . Hernia repair      umbilical  . Total hip arthroplasty Right 2012  . Tonsillectomy    . Tubal ligation    . Sacral fusion  07/2012  . Ankle fusion Left   . Tibia fracture surgery Right   . Colonoscopy    . Upper gastrointestinal endoscopy     Medication reviewed. See MAR  Family History  Problem Relation Age of Onset  . Dementia Mother   . Hypertension Father   . Heart attack Father   . Cancer Maternal Aunt     breast cancer  . Alzheimer's disease Maternal Aunt   . Stroke Paternal Uncle   . Cancer Cousin     breast cancer  . Colon cancer Neg Hx    History   Social History  . Marital Status: Widowed    Spouse Name:  N/A    Number of Children: N/A  . Years of Education: N/A   Occupational History  . 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 Smoking status: Former Smoker -- 2.00 packs/day for 10 years    Types: Cigarettes    Quit date: 02/13/1967  . Smokeless tobacco: Never Used     Comment: smoked 1961-1969 , up to 3 ppd, mainly 2 ppd  . Alcohol Use: 2.4 oz/week    4 Glasses of wine per week  . Drug Use: No  . Sexual Activity: Not on file   Other Topics Concern  . Not on file   Social History Narrative   Physical exam BP 142/80 mmHg  Pulse 85  Temp(Src) 98 F (36.7 C)  Resp 18  SpO2 96%  General- elderly  female in no acute distress, frail Head and face- multiple ecchymoses and bruises, dressing on frontal scalp clean and dry Eyes- no pallor, no icterus, no discharge Neck- no lymphadenopathy Mouth- normal mucus membrane Cardiovascular- normal s1,s2, no murmurs, normal distal pulses Respiratory- bilateral clear to auscultation, no wheeze, no rhonchi, no crackles Abdomen- bowel sounds present, soft, non tender Musculoskeletal- able to move all 4 extremities, neck in cervical collar, no leg edema Neurological- no focal deficit Skin- warm and dry, multiple brusies Psychiatry- alert and oriented to person, place and time, normal mood and affect  Labs CBC Latest Ref Rng 02/04/2014 02/01/2014 02/01/2014  WBC 4.0 - 10.5 K/uL 9.3 8.7 8.9  Hemoglobin 12.0 - 15.0 g/dL 11.8(L) 12.0 11.5(L)  Hematocrit 36.0 - 46.0 % 34.2(L) 35.6(L) 34.3(L)  Platelets 150 - 400 K/uL 188 212 213   CMP Latest Ref Rng 02/04/2014 02/02/2014 02/01/2014  Glucose 70 - 99 mg/dL 197(H) 152(H) 145(H)  BUN 6 - 23 mg/dL <5(L) 6 10  Creatinine 0.50 - 1.10 mg/dL 0.45(L) 0.49(L) 0.54  Sodium 135 - 145 mmol/L 123(L) 123(L) 124(L)  Potassium 3.5 - 5.1 mmol/L 3.1(L) 3.5 3.9  Chloride 96 - 112 mEq/L 88(L) 87(L) 85(L)  CO2 19 - 32 mmol/L 26 25 23   Calcium 8.4 - 10.5 mg/dL 8.0(L) 7.8(L) 8.5  Total Protein 6.0 - 8.3 g/dL - - 6.9  Total Bilirubin 0.3 - 1.2 mg/dL - - 0.3  Alkaline Phos 39 - 117 U/L - - 51  AST 0 - 37 U/L - - 34  ALT 0 - 35 U/L - - 21    Assessment/plan  Physical deconditioning Will have patient work with PT/OT as tolerated to regain strength and restore function.  Fall precautions are in place.  cervical spine fracture S/p cervical collar placement. Has outpatient follow up with neurosurgery  [facility] Fall precautions. Has f/u with neurosurgery, aaox 3, monitor neurologically  Hyponatremia Continue to hold spironolactone and check bmp  HTN Continue cozaar and verapamil for now, monitor  bp  Hyperlipidemia continue Lipitor 40 mg daily  GERD continue Protonix 40 mg daily   Depression continue Lexapro 20 mg daily and monitor clinically  Anemia Off iron supplement, monitor h&h

## 2014-02-15 ENCOUNTER — Other Ambulatory Visit: Payer: Self-pay | Admitting: *Deleted

## 2014-02-15 MED ORDER — TRAMADOL HCL 50 MG PO TABS: ORAL_TABLET | ORAL | Status: DC

## 2014-02-15 NOTE — Telephone Encounter (Signed)
[facility] 

## 2014-02-16 DIAGNOSIS — I10 Essential (primary) hypertension: Secondary | ICD-10-CM | POA: Diagnosis not present

## 2014-02-22 ENCOUNTER — Other Ambulatory Visit: Payer: Self-pay

## 2014-02-22 MED ORDER — LORAZEPAM 1 MG PO TABS: ORAL_TABLET | ORAL | Status: DC

## 2014-02-22 NOTE — Telephone Encounter (Signed)
Rx faxed to [facility] @ [phone], phone number [phone]  

## 2014-02-24 ENCOUNTER — Non-Acute Institutional Stay (SKILLED_NURSING_FACILITY): Payer: Medicare Other | Admitting: Adult Health

## 2014-02-24 ENCOUNTER — Encounter: Payer: Self-pay | Admitting: Adult Health

## 2014-02-24 DIAGNOSIS — F10230 Alcohol dependence with withdrawal, uncomplicated: Secondary | ICD-10-CM

## 2014-02-24 DIAGNOSIS — E871 Hypo-osmolality and hyponatremia: Secondary | ICD-10-CM

## 2014-02-24 DIAGNOSIS — D509 Iron deficiency anemia, unspecified: Secondary | ICD-10-CM

## 2014-02-24 DIAGNOSIS — F329 Major depressive disorder, single episode, unspecified: Secondary | ICD-10-CM

## 2014-02-24 DIAGNOSIS — K2100 Gastro-esophageal reflux disease with esophagitis, without bleeding: Secondary | ICD-10-CM

## 2014-02-24 DIAGNOSIS — R5381 Other malaise: Secondary | ICD-10-CM

## 2014-02-24 DIAGNOSIS — I10 Essential (primary) hypertension: Secondary | ICD-10-CM

## 2014-02-24 DIAGNOSIS — I609 Nontraumatic subarachnoid hemorrhage, unspecified: Secondary | ICD-10-CM

## 2014-02-24 DIAGNOSIS — E785 Hyperlipidemia, unspecified: Secondary | ICD-10-CM

## 2014-02-24 DIAGNOSIS — F32A Depression, unspecified: Secondary | ICD-10-CM

## 2014-02-24 DIAGNOSIS — S129XXS Fracture of neck, unspecified, sequela: Secondary | ICD-10-CM

## 2014-02-24 NOTE — Progress Notes (Signed)
Patient ID: Hailey [name], female   DOB: [birth year], 74 y.o.   MRN: [id number]   02/24/2014  Facility:  [facility]ursing Home Location:  [place] Room Number: 705-P LEVEL OF CARE:  SNF (31)   Chief Complaint  Patient presents with  . Discharge Note    Physical deconditioning, cervical spine fracture, hyponatremia, hyperlipidemia, hypertension, GERD and alcohol dependence    HISTORY OF PRESENT ILLNESS:  This is a 74 year old female who is for discharge home with Home health PT and OT. She has been admitted to [facility] on 02/04/14 from [facility]. She has past medical history of GERD, hypertension, hyperlipidemia and alcoholism. Patient had a fall due to being intoxicated with alcohol. She admitted to drinking 4 glasses of wine prior to fall. CT scan showed a frontal scalp hematoma and C1 fracture. She was also found to be hyponatremic. Patient was admitted to this facility for short-term rehabilitation after the patient's recent hospitalization.  Patient has completed SNF rehabilitation and therapy has cleared the patient for discharge.   PAST MEDICAL HISTORY:  Past Medical History  Diagnosis Date  . OSA (obstructive sleep apnea)   . Closed fracture of rib(s), unspecified   . 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 Esophageal reflux   . Osteoarthrosis, unspecified whether generalized or localized, unspecified site   . Chronic interstitial cystitis   . Personal history of other disorders of nervous system and sense organs   . Unspecified closed fracture of pelvis     x 2 2004/2006  . Disorder of bone and cartilage, unspecified   . Unspecified essential hypertension   . Other and unspecified hyperlipidemia   . Allergic rhinitis, cause unspecified   . Chronic insomnia   . Personal history of colonic adenomas 11/28/2012  . SAH (subarachnoid hemorrhage) 02/02/2014  . Cervical spine fracture 02/01/2014    CURRENT  MEDICATIONS: Reviewed per MAR/see medication list  Allergies  Allergen Reactions  . Resorcinol Swelling and Other (See Comments)    blisters  . Zolpidem     automated behavior as wandering     REVIEW OF SYSTEMS:  GENERAL: no change in appetite, no fatigue, no weight changes, no fever, chills or weakness RESPIRATORY: no cough, SOB, DOE, wheezing, hemoptysis CARDIAC: no chest pain, edema or palpitations GI: no abdominal pain, diarrhea, constipation, heart burn  PHYSICAL EXAMINATION  GENERAL: no acute distress, normal body habitus NECK: supple, trachea midline, no neck masses, no thyroid tenderness, no thyromegaly LYMPHATICS: no LAN in the neck, no supraclavicular LAN RESPIRATORY: breathing is even & unlabored, BS CTAB CARDIAC: RRR, no murmur,no extra heart sounds, no edema GI: abdomen soft, normal BS, no masses, no tenderness, no hepatomegaly, no splenomegaly EXTREMITIES: Able to move 4 extremities; has C-colllar PSYCHIATRIC: the patient is alert & oriented to person, affect & behavior appropriate  LABS/RADIOLOGY:  Labs reviewed: Basic Metabolic Panel:  Recent Labs  02/01/14 0535 02/02/14 0900 02/04/14 0805  NA 124* 123* 123*  K 3.9 3.5 3.1*  CL 85* 87* 88*  CO2 23 25 26   GLUCOSE 145* 152* 197*  BUN 10 6 <5*  CREATININE 0.54 0.49* 0.45*  CALCIUM 8.5 7.8* 8.0*   Liver Function Tests:  Recent Labs  11/18/13 1023 02/01/14 0535  AST 16 34  ALT 11 21  ALKPHOS 40 51  BILITOT 0.2* 0.3  PROT 6.5 6.9  ALBUMIN 4.0 4.3    CBC:  Recent Labs  11/17/13  2055  02/01/14 0032 02/01/14 0535 02/04/14 0805  WBC 6.2  < > 8.9 8.7 9.3  NEUTROABS 3.5  --  7.2  --   --   HGB 11.0*  < > 11.5* 12.0 11.8*  HCT 33.2*  < > 34.3* 35.6* 34.2*  MCV 94.1  < > 93.0 92.0 91.4  PLT 217  < > 213 212 188  < > = values in this interval not displayed. CBG:  Recent Labs  02/02/14 0742 02/03/14 0740 02/04/14 0749  GLUCAP 131* 192* 150*    Ct Head Wo Contrast  02/04/2014    CLINICAL DATA:  Sudden change in mental status this morning. Recent fall. Initial encounter.  EXAM: CT HEAD WITHOUT CONTRAST  TECHNIQUE: Contiguous axial images were obtained from the base of the skull through the vertex without intravenous contrast.  COMPARISON:  CT of the head performed 02/01/2014  FINDINGS: There is no evidence of acute infarction, mass lesion, or intra- or extra-axial hemorrhage on CT.  Prominence of the ventricles and sulci reflects mild cortical volume loss. A chronic lacunar infarct is noted at the right cerebellar hemisphere. Scattered periventricular and subcortical white matter change likely reflects small vessel ischemic microangiopathy.  The brainstem and fourth ventricle are within normal limits. The basal ganglia are unremarkable in appearance. The cerebral hemispheres demonstrate grossly normal gray-white differentiation. No mass effect or midline shift is seen.  There is no evidence of fracture; visualized osseous structures are unremarkable in appearance. The orbits are within normal limits. The paranasal sinuses and mastoid air cells are well-aerated. A soft tissue laceration is noted overlying the frontal calvarium, with associated soft tissue swelling.  IMPRESSION: 1. No acute intracranial pathology seen on CT. 2. Soft tissue laceration overlying the frontal calvarium, with associated soft tissue swelling. 3. Mild cortical volume loss and scattered small vessel ischemic microangiopathy. 4. 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   Electronically Signed   By: Garald Balding M.D.   On: 02/04/2014 04:50   Ct Head Wo Contrast  02/01/2014   ADDENDUM REPORT: 02/01/2014 15:38  ADDENDUM: Critical Value/emergent results were called by telephone at the time of interpretation on 02/01/2014 at 3:38 pm to Dr. Jacquelynn Cree , who verbally acknowledged these results.   Electronically Signed   By: Lowella Grip M.D.   On: 02/01/2014 15:38   02/01/2014   CLINICAL  DATA:  Patient fell 1 day prior with frontal hematoma. Projectile vomiting  EXAM: CT HEAD WITHOUT CONTRAST  TECHNIQUE: Contiguous axial images were obtained from the base of the skull through the vertex without intravenous contrast.  COMPARISON:  February 01, 2014 study obtained earlier in the day  FINDINGS: There is mild diffuse atrophy with somewhat greater frontal atrophy than elsewhere. There is a mild degree of subarachnoid hemorrhage in the anterior to mid left temporal lobe. A minimal focus of subarachnoid hemorrhage is also noted in the superior left temporal lobe on slice 19 series 2. No other appreciable subarachnoid hemorrhage is identified. There is no mass effect. There is no appreciable subdural or epidural fluid. No acute infarct is seen. There is patchy small vessel disease in the centra semiovale bilaterally.  There is a frontal scalp hematoma, stable.  No skull fracture is seen. Mastoid air cells are clear. Fracture of the anterior aspect of C1, slightly displaced, is again noted, but incompletely visualized. The patient has known C1 and C2 fractures from CT of the cervical spine obtained earlier in the day.  IMPRESSION: There is  a relatively mild degree of subarachnoid hemorrhage currently in the left temporal lobe and to a lesser amount in the right temporal lobe. These findings are best appreciated on axial slices 10, 11, and 12, series 2 on the left than on the right on slice 19 series 2. There is stable atrophy with patchy small vessel disease. There is no subdural or epidural fluid. The fracture of the anterior aspect of C1 is incompletely visualized; please see CT report of the cervical spine from earlier in the day.  Electronically Signed: By: Lowella Grip M.D. On: 02/01/2014 15:22   Ct Head Wo Contrast  02/01/2014   CLINICAL DATA:  Fall, neck pain, frontal hematoma  EXAM: CT HEAD WITHOUT CONTRAST  CT CERVICAL SPINE WITHOUT CONTRAST  TECHNIQUE: Multidetector CT imaging of the head  and cervical spine was performed following the standard protocol without intravenous contrast. Multiplanar CT image reconstructions of the cervical spine were also generated.  COMPARISON:  Head CT 11/17/2013  FINDINGS: CT HEAD FINDINGS  Frontal scalp hematoma is identified. No underlying skull fracture. Orbits and paranasal sinuses are grossly unremarkable. Mild cortical volume loss noted with proportional ventricular prominence. Areas of periventricular white matter hypodensity are most compatible with small vessel ischemic change. No acute hemorrhage, infarct, or mass lesion is identified. No midline shift.  CT CERVICAL SPINE FINDINGS  C1 through the cervicothoracic junction is visualized in its entirety. There is a type 2 fracture through the body of C2. Fractures through the bilateral lamina of C1 and anterior aspect of the C1 ring are identified. Evidence of previous posterior fusion spanning C4-C7 without evidence for hardware failure. Vertebral body heights are preserved. Moderate multi-level mid/inferior cervical spine disc degenerative change noted. Lung apices are clear.  IMPRESSION: Frontal scalp hematoma without acute intracranial finding.  Type 2 fracture of the body of the dens and Jefferson fracture of C1.  Critical Value/emergent results were called by telephone at the time of interpretation on 02/01/2014 at 12:30 am to Dr. Orlie Dakin , who verbally acknowledged these results.   Electronically Signed   By: Conchita Paris M.D.   On: 02/01/2014 00:33   Ct Cervical Spine Wo Contrast  02/01/2014   CLINICAL DATA:  Fall, neck pain, frontal hematoma  EXAM: CT HEAD WITHOUT CONTRAST  CT CERVICAL SPINE WITHOUT CONTRAST  TECHNIQUE: Multidetector CT imaging of the head and cervical spine was performed following the standard protocol without intravenous contrast. Multiplanar CT image reconstructions of the cervical spine were also generated.  COMPARISON:  Head CT 11/17/2013  FINDINGS: CT HEAD FINDINGS   Frontal scalp hematoma is identified. No underlying skull fracture. Orbits and paranasal sinuses are grossly unremarkable. Mild cortical volume loss noted with proportional ventricular prominence. Areas of periventricular white matter hypodensity are most compatible with small vessel ischemic change. No acute hemorrhage, infarct, or mass lesion is identified. No midline shift.  CT CERVICAL SPINE FINDINGS  C1 through the cervicothoracic junction is visualized in its entirety. There is a type 2 fracture through the body of C2. Fractures through the bilateral lamina of C1 and anterior aspect of the C1 ring are identified. Evidence of previous posterior fusion spanning C4-C7 without evidence for hardware failure. Vertebral body heights are preserved. Moderate multi-level mid/inferior cervical spine disc degenerative change noted. Lung apices are clear.  IMPRESSION: Frontal scalp hematoma without acute intracranial finding.  Type 2 fracture of the body of the dens and Jefferson fracture of C1.  Critical Value/emergent results were called by telephone at the time  of interpretation on 02/01/2014 at 12:30 am to Dr. Orlie Dakin , who verbally acknowledged these results.   Electronically Signed   By: Conchita Paris M.D.   On: 02/01/2014 00:33   Dg Abd 2 Views  02/01/2014   CLINICAL DATA:  Vomiting for 1 day  EXAM: ABDOMEN - 2 VIEW  COMPARISON:  08/17/2011, 09/22/2004  FINDINGS: Postoperative changes are noted in the lumbar spine which are stable in appearance. No free air is seen. A nonobstructive bowel gas pattern is noted. There is a rounded calcification in the right upper quadrant which has progressed in the interval from prior exam. 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 The possibility of cholelithiasis deserve consideration as well. No other focal abnormality is seen  IMPRESSION: Nonspecific abdomen.  Rounded calcification in the right upper quadrant as  described.   Electronically Signed   By: Inez Catalina M.D.   On: 02/01/2014 16:00    ASSESSMENT/PLAN:  Physical deconditioning - for Home health PT and OT Fall/cervical spine fractures/facial abrasion/SAH - continue C-collar immobilizer and outpatient follow-up Hyponatremia - likely due to alcohol abuse; continue Ativan 1 mg PO BID Hyperlipidemia - continue Lipitor 40 mg by mouth daily Essential hypertension - continue verapamil 180 mg by mouth daily at bedtime and start Cozaar 50 mg by mouth daily GERD - continue Protonix 40 mg by mouth daily  Alcohol dependence with uncomplicated withdrawal - stable; continue Ativan 1 mg by mouth BID Depression - mood is stable; continue Lexapro 20 mg by mouth daily Anemia, iron deficiency - hemoglobin 11.8, stable  I have filled out patient's discharge paperwork. Patient will receive home health PT and OT.  Total discharge time: Greater than 30 minutes  Discharge time involved coordination of the discharge process with social worker, nursing staff and therapy department. Medical justification for home health services verified.     [facility], NP [employer] [phone]

## 2014-02-26 ENCOUNTER — Ambulatory Visit (INDEPENDENT_AMBULATORY_CARE_PROVIDER_SITE_OTHER): Payer: Medicare Other | Admitting: Internal Medicine

## 2014-02-26 ENCOUNTER — Other Ambulatory Visit (INDEPENDENT_AMBULATORY_CARE_PROVIDER_SITE_OTHER): Payer: Medicare Other

## 2014-02-26 ENCOUNTER — Encounter: Payer: Self-pay | Admitting: Internal Medicine

## 2014-02-26 VITALS — BP 150/94 | HR 94 | Temp 97.4°F | Resp 15 | Ht 67.0 in | Wt 151.0 lb

## 2014-02-26 DIAGNOSIS — I10 Essential (primary) hypertension: Secondary | ICD-10-CM

## 2014-02-26 DIAGNOSIS — E871 Hypo-osmolality and hyponatremia: Secondary | ICD-10-CM

## 2014-02-26 DIAGNOSIS — D62 Acute posthemorrhagic anemia: Secondary | ICD-10-CM | POA: Diagnosis not present

## 2014-02-26 DIAGNOSIS — F1010 Alcohol abuse, uncomplicated: Secondary | ICD-10-CM

## 2014-02-26 DIAGNOSIS — S129XXD Fracture of neck, unspecified, subsequent encounter: Secondary | ICD-10-CM

## 2014-02-26 DIAGNOSIS — R739 Hyperglycemia, unspecified: Secondary | ICD-10-CM

## 2014-02-26 LAB — CBC WITH DIFFERENTIAL/PLATELET
BASOS PCT: 0.7 % (ref 0.0–3.0)
Basophils Absolute: 0.1 10*3/uL (ref 0.0–0.1)
EOS PCT: 1.8 % (ref 0.0–5.0)
Eosinophils Absolute: 0.2 10*3/uL (ref 0.0–0.7)
HCT: 37.6 % (ref 36.0–46.0)
Hemoglobin: 12.3 g/dL (ref 12.0–15.0)
Lymphocytes Relative: 23.8 % (ref 12.0–46.0)
Lymphs Abs: 2.4 10*3/uL (ref 0.7–4.0)
MCHC: 32.8 g/dL (ref 30.0–36.0)
MCV: 92.9 fl (ref 78.0–100.0)
MONO ABS: 0.7 10*3/uL (ref 0.1–1.0)
Monocytes Relative: 7.2 % (ref 3.0–12.0)
Neutro Abs: 6.7 10*3/uL (ref 1.4–7.7)
Neutrophils Relative %: 66.5 % (ref 43.0–77.0)
PLATELETS: 296 10*3/uL (ref 150.0–400.0)
RBC: 4.04 Mil/uL (ref 3.87–5.11)
RDW: 14 % (ref 11.5–15.5)
WBC: 10.1 10*3/uL (ref 4.0–10.5)

## 2014-02-26 LAB — BASIC METABOLIC PANEL
BUN: 25 mg/dL — ABNORMAL HIGH (ref 6–23)
CALCIUM: 9.7 mg/dL (ref 8.4–10.5)
CO2: 28 mEq/L (ref 19–32)
Chloride: 100 mEq/L (ref 96–112)
Creatinine, Ser: 0.71 mg/dL (ref 0.40–1.20)
GFR: 85.67 mL/min (ref >60.00)
Glucose, Bld: 101 mg/dL — ABNORMAL HIGH (ref 70–99)
Potassium: 4.6 mEq/L (ref 3.5–5.1)
Sodium: 134 mEq/L — ABNORMAL LOW (ref 135–145)

## 2014-02-26 LAB — OSMOLALITY, URINE: Osmolality, Ur: 601 mOsm/kg (ref 390–1090)

## 2014-02-26 LAB — UREA NITROGEN, URINE: Urea Nitrogen, Ur: 434 mg/dL

## 2014-02-26 LAB — CREATININE, URINE, RANDOM: Creatinine, Urine: 54.3 mg/dL

## 2014-02-26 LAB — SODIUM, URINE, RANDOM: Sodium, Ur: 160 mEq/L

## 2014-02-26 NOTE — Patient Instructions (Signed)

## 2014-02-26 NOTE — Progress Notes (Signed)
Pre visit review using our clinic review tool, if applicable. No additional management support is needed unless otherwise documented below in the visit note. 

## 2014-02-26 NOTE — Progress Notes (Signed)
   Subjective:    Patient ID: Hailey [name], female    DOB: 1940-10-06, 74 y.o.   MRN: [id number]  HPI The hospital records 12/20-12/24 were reviewed. She sustained a subarachnoid hemorrhage ;scalp hematoma and a Jefferson fracture of C1 of the body of the dens.    The injury apparently was related to excess alcohol intake with an apparent "face plant" type injury. Apparently she fell attempting to enter house to retrieve money to pay cab driver. He initiated EMS. No alcohol level recorded except 287 on 11/17/13 (0-11). She was seen by Psychiatry 02/02/14; she apparently agreed to [place] involvement post discharge.   She had cervical fusions in 2008 by Dr. Harl Bowie at [facility] Dr. Christella Noa, NS saw her in consultation @ [facility] & recommended cervical collar be worn.  Labs in the hospital revealed a sodium as low as 123. Potassium was 3.1. Renal function was normal.  Random glucoses ranged 145-197. She was mildly anemic with a hemoglobin of 11.8 hematocrit 34.2.  She has been wearing the collar but is concerned about hygiene issues related to it .She also describes it as being "claustrophobic".  Post hospitalization  she was in rehabilitation until 02/24/14. Blood pressure there was well controlled and losartan dose was decreased. Since she's been home she has resumed the prior dose of losartan. She is not monitoring blood pressure at home. She did not take BP meds today.  She does not add salt to her food.  Review of Systems  Fever, chills, sweats, or unexplained weight loss not present. She has residual pain over crown & around left ear. Mental status change or memory loss denied. Blurred vision , diplopia or vision loss absent but depth perception altered. There is no numbness, tingling, or weakness in extremities.   No loss of control of bladder or bowels. Radicular type pain absent. No seizure stigmata.    Objective:   Physical Exam  Pertinent or positive findings include: She has  resolving faint ecchymosis over the forehead. There is a darker area of resolving ecchymosis of the right temple and right lateral maxillary area. The laceration of the forehead is healing well. There is slight irregular character to the skin below the left nare with suggestion of minor granulomatous change. No vesicular lesions are noted Field of vision is normal as is extraocular motion. She exhibits tremor of outstretched hands Reflexes are 1. 5-2 plus.  She is oriented 3 Pedal pulses are decreased Her gait is broad and slightly unsteady. Her household worker walks with her without actually supporting her.  General appearance :adequately nourished; in no distress. Eyes: No conjunctival inflammation or scleral icterus is present. Oral exam: Dental hygiene is good. Lips and gums are healthy appearing.There is no oropharyngeal erythema or exudate noted.  Mandible excursion somewhat limited by cervical collar. Heart:  Normal rate and regular rhythm. S1 and S2 normal without gallop, murmur, click, rub or other extra sounds   Lungs:Chest clear to auscultation; no wheezes, rhonchi,rales ,or rubs present.No increased work of breathing.  Vascular : all pulses equal ; no bruits present.Carotid access limited by collar. Skin:Warm & dry.  Intact without suspicious lesions or rashes ; no jaundice or tenting Lymphatic: No lymphadenopathy is noted about the head, neck, axilla           Assessment & Plan:  See Current Assessment & Plan in Problem List under specific Diagnosis

## 2014-02-27 DIAGNOSIS — R739 Hyperglycemia, unspecified: Secondary | ICD-10-CM | POA: Insufficient documentation

## 2014-02-27 DIAGNOSIS — F1010 Alcohol abuse, uncomplicated: Secondary | ICD-10-CM | POA: Insufficient documentation

## 2014-02-27 NOTE — Assessment & Plan Note (Signed)
Based on her history ( wine intake described as 2- 4 glasses in records & 02/26/14); she does not appreciate dependent state or abuse. I told her she was lucky to be alive.

## 2014-02-27 NOTE — Assessment & Plan Note (Signed)
A1c

## 2014-02-27 NOTE — Assessment & Plan Note (Signed)
BMET 

## 2014-02-28 ENCOUNTER — Other Ambulatory Visit: Payer: Self-pay | Admitting: Internal Medicine

## 2014-03-01 ENCOUNTER — Other Ambulatory Visit (INDEPENDENT_AMBULATORY_CARE_PROVIDER_SITE_OTHER): Payer: Medicare Other

## 2014-03-01 ENCOUNTER — Telehealth: Payer: Self-pay

## 2014-03-01 DIAGNOSIS — M25511 Pain in right shoulder: Secondary | ICD-10-CM | POA: Diagnosis not present

## 2014-03-01 DIAGNOSIS — R5383 Other fatigue: Secondary | ICD-10-CM | POA: Diagnosis not present

## 2014-03-01 DIAGNOSIS — M810 Age-related osteoporosis without current pathological fracture: Secondary | ICD-10-CM | POA: Diagnosis not present

## 2014-03-01 DIAGNOSIS — R739 Hyperglycemia, unspecified: Secondary | ICD-10-CM | POA: Diagnosis not present

## 2014-03-01 DIAGNOSIS — E559 Vitamin D deficiency, unspecified: Secondary | ICD-10-CM | POA: Diagnosis not present

## 2014-03-01 LAB — HEMOGLOBIN A1C: Hgb A1c MFr Bld: 6 % (ref 4.6–6.5)

## 2014-03-01 NOTE — Telephone Encounter (Signed)
Add on request has been faxed to lab

## 2014-03-01 NOTE — Telephone Encounter (Signed)
OK X1 

## 2014-03-01 NOTE — Telephone Encounter (Signed)
-----   Message from Hendricks Limes, MD sent at 02/27/2014 12:47 PM EST ----- Please add A1c (R73.9)

## 2014-03-02 ENCOUNTER — Telehealth: Payer: Self-pay

## 2014-03-02 NOTE — Telephone Encounter (Signed)
Shonda with Home Health called and left message regarding pt. Pt does not wish to participate with [facility].   If you have any questions Reuben Likes can be reached at [phone]

## 2014-03-10 DIAGNOSIS — M810 Age-related osteoporosis without current pathological fracture: Secondary | ICD-10-CM | POA: Diagnosis not present

## 2014-03-10 DIAGNOSIS — M25511 Pain in right shoulder: Secondary | ICD-10-CM | POA: Diagnosis not present

## 2014-03-12 ENCOUNTER — Other Ambulatory Visit: Payer: Self-pay | Admitting: Internal Medicine

## 2014-03-15 DIAGNOSIS — S12090D Other displaced fracture of first cervical vertebra, subsequent encounter for fracture with routine healing: Secondary | ICD-10-CM | POA: Diagnosis not present

## 2014-03-15 DIAGNOSIS — S129XXA Fracture of neck, unspecified, initial encounter: Secondary | ICD-10-CM | POA: Diagnosis not present

## 2014-03-15 DIAGNOSIS — S12190D Other displaced fracture of second cervical vertebra, subsequent encounter for fracture with routine healing: Secondary | ICD-10-CM | POA: Diagnosis not present

## 2014-03-15 DIAGNOSIS — Z981 Arthrodesis status: Secondary | ICD-10-CM | POA: Diagnosis not present

## 2014-03-15 DIAGNOSIS — Z87891 Personal history of nicotine dependence: Secondary | ICD-10-CM | POA: Diagnosis not present

## 2014-03-30 ENCOUNTER — Other Ambulatory Visit: Payer: Self-pay | Admitting: Internal Medicine

## 2014-03-30 NOTE — Telephone Encounter (Signed)
OK X 3 mos 

## 2014-04-05 DIAGNOSIS — Z79891 Long term (current) use of opiate analgesic: Secondary | ICD-10-CM | POA: Diagnosis not present

## 2014-04-05 DIAGNOSIS — M150 Primary generalized (osteo)arthritis: Secondary | ICD-10-CM | POA: Diagnosis not present

## 2014-04-05 DIAGNOSIS — S12001S Unspecified nondisplaced fracture of first cervical vertebra, sequela: Secondary | ICD-10-CM | POA: Diagnosis not present

## 2014-04-05 DIAGNOSIS — G894 Chronic pain syndrome: Secondary | ICD-10-CM | POA: Diagnosis not present

## 2014-04-06 ENCOUNTER — Ambulatory Visit (INDEPENDENT_AMBULATORY_CARE_PROVIDER_SITE_OTHER): Payer: Medicare Other

## 2014-04-06 DIAGNOSIS — J309 Allergic rhinitis, unspecified: Secondary | ICD-10-CM

## 2014-04-07 DIAGNOSIS — S12100G Unspecified displaced fracture of second cervical vertebra, subsequent encounter for fracture with delayed healing: Secondary | ICD-10-CM | POA: Diagnosis not present

## 2014-04-07 DIAGNOSIS — Z981 Arthrodesis status: Secondary | ICD-10-CM | POA: Diagnosis not present

## 2014-04-07 DIAGNOSIS — Z87891 Personal history of nicotine dependence: Secondary | ICD-10-CM | POA: Diagnosis not present

## 2014-04-07 DIAGNOSIS — S12030A Displaced posterior arch fracture of first cervical vertebra, initial encounter for closed fracture: Secondary | ICD-10-CM | POA: Diagnosis not present

## 2014-04-07 DIAGNOSIS — S12111A Posterior displaced Type II dens fracture, initial encounter for closed fracture: Secondary | ICD-10-CM | POA: Diagnosis not present

## 2014-04-10 ENCOUNTER — Other Ambulatory Visit: Payer: Self-pay | Admitting: Internal Medicine

## 2014-04-12 NOTE — Telephone Encounter (Signed)
OK but same instructions; prn only due to GI risks

## 2014-04-13 DIAGNOSIS — Z01818 Encounter for other preprocedural examination: Secondary | ICD-10-CM | POA: Diagnosis not present

## 2014-04-13 DIAGNOSIS — S12100G Unspecified displaced fracture of second cervical vertebra, subsequent encounter for fracture with delayed healing: Secondary | ICD-10-CM | POA: Diagnosis not present

## 2014-04-19 DIAGNOSIS — M4852XS Collapsed vertebra, not elsewhere classified, cervical region, sequela of fracture: Secondary | ICD-10-CM | POA: Diagnosis not present

## 2014-04-21 DIAGNOSIS — Z5189 Encounter for other specified aftercare: Secondary | ICD-10-CM | POA: Diagnosis not present

## 2014-04-21 DIAGNOSIS — M4852XS Collapsed vertebra, not elsewhere classified, cervical region, sequela of fracture: Secondary | ICD-10-CM | POA: Diagnosis not present

## 2014-04-26 DIAGNOSIS — G4733 Obstructive sleep apnea (adult) (pediatric): Secondary | ICD-10-CM | POA: Diagnosis present

## 2014-04-26 DIAGNOSIS — S12100G Unspecified displaced fracture of second cervical vertebra, subsequent encounter for fracture with delayed healing: Secondary | ICD-10-CM | POA: Diagnosis not present

## 2014-04-26 DIAGNOSIS — I10 Essential (primary) hypertension: Secondary | ICD-10-CM | POA: Diagnosis present

## 2014-04-26 DIAGNOSIS — W19XXXA Unspecified fall, initial encounter: Secondary | ICD-10-CM | POA: Diagnosis not present

## 2014-04-26 DIAGNOSIS — M542 Cervicalgia: Secondary | ICD-10-CM | POA: Diagnosis not present

## 2014-04-26 DIAGNOSIS — S12100A Unspecified displaced fracture of second cervical vertebra, initial encounter for closed fracture: Secondary | ICD-10-CM | POA: Diagnosis not present

## 2014-04-26 DIAGNOSIS — S12030A Displaced posterior arch fracture of first cervical vertebra, initial encounter for closed fracture: Secondary | ICD-10-CM | POA: Diagnosis not present

## 2014-04-26 DIAGNOSIS — M4852XS Collapsed vertebra, not elsewhere classified, cervical region, sequela of fracture: Secondary | ICD-10-CM | POA: Diagnosis not present

## 2014-04-26 DIAGNOSIS — J42 Unspecified chronic bronchitis: Secondary | ICD-10-CM | POA: Diagnosis present

## 2014-04-26 DIAGNOSIS — F419 Anxiety disorder, unspecified: Secondary | ICD-10-CM | POA: Diagnosis present

## 2014-04-26 DIAGNOSIS — M5137 Other intervertebral disc degeneration, lumbosacral region: Secondary | ICD-10-CM | POA: Diagnosis present

## 2014-04-26 DIAGNOSIS — E780 Pure hypercholesterolemia: Secondary | ICD-10-CM | POA: Diagnosis present

## 2014-04-26 DIAGNOSIS — S12000A Unspecified displaced fracture of first cervical vertebra, initial encounter for closed fracture: Secondary | ICD-10-CM | POA: Diagnosis not present

## 2014-04-26 DIAGNOSIS — Z87891 Personal history of nicotine dependence: Secondary | ICD-10-CM | POA: Diagnosis not present

## 2014-04-26 DIAGNOSIS — S12111A Posterior displaced Type II dens fracture, initial encounter for closed fracture: Secondary | ICD-10-CM | POA: Diagnosis not present

## 2014-04-26 DIAGNOSIS — M40205 Unspecified kyphosis, thoracolumbar region: Secondary | ICD-10-CM | POA: Diagnosis present

## 2014-04-26 DIAGNOSIS — R339 Retention of urine, unspecified: Secondary | ICD-10-CM | POA: Diagnosis present

## 2014-04-26 DIAGNOSIS — Z981 Arthrodesis status: Secondary | ICD-10-CM | POA: Diagnosis not present

## 2014-04-26 DIAGNOSIS — M5030 Other cervical disc degeneration, unspecified cervical region: Secondary | ICD-10-CM | POA: Diagnosis present

## 2014-05-04 DIAGNOSIS — Z9181 History of falling: Secondary | ICD-10-CM | POA: Diagnosis not present

## 2014-05-04 DIAGNOSIS — S12100G Unspecified displaced fracture of second cervical vertebra, subsequent encounter for fracture with delayed healing: Secondary | ICD-10-CM | POA: Diagnosis not present

## 2014-05-04 DIAGNOSIS — R262 Difficulty in walking, not elsewhere classified: Secondary | ICD-10-CM | POA: Diagnosis not present

## 2014-05-04 DIAGNOSIS — F329 Major depressive disorder, single episode, unspecified: Secondary | ICD-10-CM | POA: Diagnosis not present

## 2014-05-04 DIAGNOSIS — M6281 Muscle weakness (generalized): Secondary | ICD-10-CM | POA: Diagnosis not present

## 2014-05-04 DIAGNOSIS — M1990 Unspecified osteoarthritis, unspecified site: Secondary | ICD-10-CM | POA: Diagnosis not present

## 2014-05-04 DIAGNOSIS — I10 Essential (primary) hypertension: Secondary | ICD-10-CM | POA: Diagnosis not present

## 2014-05-04 DIAGNOSIS — M4852XD Collapsed vertebra, not elsewhere classified, cervical region, subsequent encounter for fracture with routine healing: Secondary | ICD-10-CM | POA: Diagnosis not present

## 2014-05-06 DIAGNOSIS — Z981 Arthrodesis status: Secondary | ICD-10-CM | POA: Diagnosis not present

## 2014-05-07 DIAGNOSIS — R9431 Abnormal electrocardiogram [ECG] [EKG]: Secondary | ICD-10-CM | POA: Diagnosis not present

## 2014-05-07 DIAGNOSIS — Z01810 Encounter for preprocedural cardiovascular examination: Secondary | ICD-10-CM | POA: Diagnosis not present

## 2014-05-10 ENCOUNTER — Telehealth: Payer: Self-pay | Admitting: *Deleted

## 2014-05-10 DIAGNOSIS — M6281 Muscle weakness (generalized): Secondary | ICD-10-CM | POA: Diagnosis not present

## 2014-05-10 DIAGNOSIS — F329 Major depressive disorder, single episode, unspecified: Secondary | ICD-10-CM | POA: Diagnosis not present

## 2014-05-10 DIAGNOSIS — N390 Urinary tract infection, site not specified: Secondary | ICD-10-CM | POA: Diagnosis not present

## 2014-05-10 DIAGNOSIS — M1990 Unspecified osteoarthritis, unspecified site: Secondary | ICD-10-CM | POA: Diagnosis not present

## 2014-05-10 DIAGNOSIS — S12100G Unspecified displaced fracture of second cervical vertebra, subsequent encounter for fracture with delayed healing: Secondary | ICD-10-CM | POA: Diagnosis not present

## 2014-05-10 DIAGNOSIS — I10 Essential (primary) hypertension: Secondary | ICD-10-CM | POA: Diagnosis not present

## 2014-05-10 DIAGNOSIS — M4852XD Collapsed vertebra, not elsewhere classified, cervical region, subsequent encounter for fracture with routine healing: Secondary | ICD-10-CM | POA: Diagnosis not present

## 2014-05-10 NOTE — Telephone Encounter (Signed)
Received call from nurse stating that she is currently at pt house. She is complaining of dysuria sxs with itching and burning. Req verbal order for UA. Gave [name] verbal for UA...Hailey [name]

## 2014-05-12 DIAGNOSIS — S12100G Unspecified displaced fracture of second cervical vertebra, subsequent encounter for fracture with delayed healing: Secondary | ICD-10-CM | POA: Diagnosis not present

## 2014-05-12 DIAGNOSIS — I10 Essential (primary) hypertension: Secondary | ICD-10-CM | POA: Diagnosis not present

## 2014-05-12 DIAGNOSIS — F329 Major depressive disorder, single episode, unspecified: Secondary | ICD-10-CM | POA: Diagnosis not present

## 2014-05-12 DIAGNOSIS — M6281 Muscle weakness (generalized): Secondary | ICD-10-CM | POA: Diagnosis not present

## 2014-05-12 DIAGNOSIS — M1990 Unspecified osteoarthritis, unspecified site: Secondary | ICD-10-CM | POA: Diagnosis not present

## 2014-05-12 DIAGNOSIS — M4852XD Collapsed vertebra, not elsewhere classified, cervical region, subsequent encounter for fracture with routine healing: Secondary | ICD-10-CM | POA: Diagnosis not present

## 2014-05-19 ENCOUNTER — Other Ambulatory Visit: Payer: Self-pay | Admitting: Internal Medicine

## 2014-05-20 DIAGNOSIS — I10 Essential (primary) hypertension: Secondary | ICD-10-CM | POA: Diagnosis not present

## 2014-05-20 DIAGNOSIS — F329 Major depressive disorder, single episode, unspecified: Secondary | ICD-10-CM | POA: Diagnosis not present

## 2014-05-20 DIAGNOSIS — M4852XD Collapsed vertebra, not elsewhere classified, cervical region, subsequent encounter for fracture with routine healing: Secondary | ICD-10-CM | POA: Diagnosis not present

## 2014-05-20 DIAGNOSIS — M6281 Muscle weakness (generalized): Secondary | ICD-10-CM | POA: Diagnosis not present

## 2014-05-20 DIAGNOSIS — S12100G Unspecified displaced fracture of second cervical vertebra, subsequent encounter for fracture with delayed healing: Secondary | ICD-10-CM | POA: Diagnosis not present

## 2014-05-20 DIAGNOSIS — M1990 Unspecified osteoarthritis, unspecified site: Secondary | ICD-10-CM | POA: Diagnosis not present

## 2014-05-21 NOTE — Telephone Encounter (Signed)
Please advise on refill.

## 2014-05-23 NOTE — Telephone Encounter (Signed)
Ok for 6 months 

## 2014-05-28 DIAGNOSIS — Z981 Arthrodesis status: Secondary | ICD-10-CM | POA: Diagnosis not present

## 2014-06-15 DIAGNOSIS — M4852XD Collapsed vertebra, not elsewhere classified, cervical region, subsequent encounter for fracture with routine healing: Secondary | ICD-10-CM | POA: Diagnosis not present

## 2014-06-30 ENCOUNTER — Other Ambulatory Visit: Payer: Self-pay | Admitting: Internal Medicine

## 2014-07-14 DIAGNOSIS — Z87891 Personal history of nicotine dependence: Secondary | ICD-10-CM | POA: Diagnosis not present

## 2014-07-14 DIAGNOSIS — M47812 Spondylosis without myelopathy or radiculopathy, cervical region: Secondary | ICD-10-CM | POA: Diagnosis not present

## 2014-07-14 DIAGNOSIS — M1990 Unspecified osteoarthritis, unspecified site: Secondary | ICD-10-CM | POA: Diagnosis not present

## 2014-07-14 DIAGNOSIS — Z8781 Personal history of (healed) traumatic fracture: Secondary | ICD-10-CM | POA: Diagnosis not present

## 2014-07-14 DIAGNOSIS — Z981 Arthrodesis status: Secondary | ICD-10-CM | POA: Diagnosis not present

## 2014-07-14 DIAGNOSIS — Z9181 History of falling: Secondary | ICD-10-CM | POA: Diagnosis not present

## 2014-07-14 DIAGNOSIS — Z09 Encounter for follow-up examination after completed treatment for conditions other than malignant neoplasm: Secondary | ICD-10-CM | POA: Diagnosis not present

## 2014-07-14 DIAGNOSIS — M4312 Spondylolisthesis, cervical region: Secondary | ICD-10-CM | POA: Diagnosis not present

## 2014-07-21 ENCOUNTER — Other Ambulatory Visit: Payer: Self-pay | Admitting: Internal Medicine

## 2014-08-03 ENCOUNTER — Encounter (HOSPITAL_COMMUNITY): Payer: Self-pay | Admitting: Family Medicine

## 2014-08-03 ENCOUNTER — Emergency Department (HOSPITAL_COMMUNITY)
Admission: EM | Admit: 2014-08-03 | Discharge: 2014-08-03 | Disposition: A | Discharge status: Home / Self Care | Payer: Medicare Other | Attending: Emergency Medicine | Admitting: Emergency Medicine

## 2014-08-03 DIAGNOSIS — Z8669 Personal history of other diseases of the nervous system and sense organs: Secondary | ICD-10-CM | POA: Diagnosis not present

## 2014-08-03 DIAGNOSIS — R1084 Generalized abdominal pain: Secondary | ICD-10-CM

## 2014-08-03 DIAGNOSIS — Z86010 Personal history of colonic polyps: Secondary | ICD-10-CM | POA: Diagnosis not present

## 2014-08-03 DIAGNOSIS — E785 Hyperlipidemia, unspecified: Secondary | ICD-10-CM | POA: Insufficient documentation

## 2014-08-03 DIAGNOSIS — Z87448 Personal history of other diseases of urinary system: Secondary | ICD-10-CM | POA: Insufficient documentation

## 2014-08-03 DIAGNOSIS — I10 Essential (primary) hypertension: Secondary | ICD-10-CM | POA: Diagnosis not present

## 2014-08-03 DIAGNOSIS — Z87828 Personal history of other (healed) physical injury and trauma: Secondary | ICD-10-CM | POA: Diagnosis not present

## 2014-08-03 DIAGNOSIS — K529 Noninfective gastroenteritis and colitis, unspecified: Secondary | ICD-10-CM | POA: Insufficient documentation

## 2014-08-03 DIAGNOSIS — Z79899 Other long term (current) drug therapy: Secondary | ICD-10-CM | POA: Insufficient documentation

## 2014-08-03 DIAGNOSIS — R112 Nausea with vomiting, unspecified: Secondary | ICD-10-CM

## 2014-08-03 DIAGNOSIS — K219 Gastro-esophageal reflux disease without esophagitis: Secondary | ICD-10-CM | POA: Insufficient documentation

## 2014-08-03 DIAGNOSIS — Z8781 Personal history of (healed) traumatic fracture: Secondary | ICD-10-CM | POA: Diagnosis not present

## 2014-08-03 DIAGNOSIS — K2970 Gastritis, unspecified, without bleeding: Secondary | ICD-10-CM | POA: Diagnosis not present

## 2014-08-03 DIAGNOSIS — M1990 Unspecified osteoarthritis, unspecified site: Secondary | ICD-10-CM | POA: Diagnosis not present

## 2014-08-03 DIAGNOSIS — Z87891 Personal history of nicotine dependence: Secondary | ICD-10-CM | POA: Insufficient documentation

## 2014-08-03 DIAGNOSIS — R197 Diarrhea, unspecified: Secondary | ICD-10-CM

## 2014-08-03 LAB — URINALYSIS, ROUTINE W REFLEX MICROSCOPIC
Bilirubin Urine: NEGATIVE
GLUCOSE, UA: NEGATIVE mg/dL
Hgb urine dipstick: NEGATIVE
KETONES UR: NEGATIVE mg/dL
NITRITE: NEGATIVE
PH: 6 (ref 5.0–8.0)
PROTEIN: NEGATIVE mg/dL
Specific Gravity, Urine: 1.016 (ref 1.005–1.030)
Urobilinogen, UA: 0.2 mg/dL (ref 0.0–1.0)

## 2014-08-03 LAB — COMPREHENSIVE METABOLIC PANEL
ALBUMIN: 4.5 g/dL (ref 3.5–5.0)
ALK PHOS: 45 U/L (ref 38–126)
ALT: 21 U/L (ref 14–54)
AST: 26 U/L (ref 15–41)
Anion gap: 11 (ref 5–15)
BUN: 26 mg/dL — ABNORMAL HIGH (ref 6–20)
CALCIUM: 9.3 mg/dL (ref 8.9–10.3)
CO2: 25 mmol/L (ref 22–32)
Chloride: 99 mmol/L — ABNORMAL LOW (ref 101–111)
Creatinine, Ser: 0.75 mg/dL (ref 0.44–1.00)
GFR calc non Af Amer: >60 mL/min (ref >60)
Glucose, Bld: 123 mg/dL — ABNORMAL HIGH (ref 65–99)
POTASSIUM: 3.8 mmol/L (ref 3.5–5.1)
SODIUM: 135 mmol/L (ref 135–145)
TOTAL PROTEIN: 6.9 g/dL (ref 6.5–8.1)
Total Bilirubin: 0.5 mg/dL (ref 0.3–1.2)

## 2014-08-03 LAB — CBC WITH DIFFERENTIAL/PLATELET
Basophils Absolute: 0 10*3/uL (ref 0.0–0.1)
Basophils Relative: 0 % (ref 0–1)
Eosinophils Absolute: 0.1 10*3/uL (ref 0.0–0.7)
Eosinophils Relative: 1 % (ref 0–5)
HCT: 39.6 % (ref 36.0–46.0)
Hemoglobin: 12.9 g/dL (ref 12.0–15.0)
Lymphocytes Relative: 19 % (ref 12–46)
Lymphs Abs: 1.8 10*3/uL (ref 0.7–4.0)
MCH: 29.5 pg (ref 26.0–34.0)
MCHC: 32.6 g/dL (ref 30.0–36.0)
MCV: 90.6 fL (ref 78.0–100.0)
MONO ABS: 0.8 10*3/uL (ref 0.1–1.0)
Monocytes Relative: 9 % (ref 3–12)
NEUTROS PCT: 71 % (ref 43–77)
Neutro Abs: 6.7 10*3/uL (ref 1.7–7.7)
PLATELETS: 232 10*3/uL (ref 150–400)
RBC: 4.37 MIL/uL (ref 3.87–5.11)
RDW: 13.8 % (ref 11.5–15.5)
WBC: 9.3 10*3/uL (ref 4.0–10.5)

## 2014-08-03 LAB — I-STAT TROPONIN, ED: Troponin i, poc: 0.02 ng/mL (ref 0.00–0.08)

## 2014-08-03 LAB — URINE MICROSCOPIC-ADD ON

## 2014-08-03 LAB — LIPASE, BLOOD: Lipase: 25 U/L (ref 22–51)

## 2014-08-03 MED ORDER — ONDANSETRON HCL 4 MG/2ML IJ SOLN
4.0000 mg | Freq: Once | INTRAMUSCULAR | Status: AC
  Administered 2014-08-03: 4 mg via INTRAVENOUS
  Filled 2014-08-03: qty 2

## 2014-08-03 MED ORDER — SODIUM CHLORIDE 0.9 % IV SOLN
1000.0000 mL | Freq: Once | INTRAVENOUS | Status: AC
  Administered 2014-08-03: 1000 mL via INTRAVENOUS

## 2014-08-03 MED ORDER — ONDANSETRON 4 MG PO TBDP: 4.0000 mg | ORAL_TABLET | Freq: Three times a day (TID) | ORAL | Status: DC | PRN

## 2014-08-03 NOTE — ED Notes (Signed)
Patient was transported via [facility]. Pt complains abd pain, nausea, vomiting, and diarrhea for the last 8 hrs. Pt's mother died earlier in the month with c-diff and the patient was in the care of her mother. Pt received ZOFRAN 4mg  IV enroute.

## 2014-08-03 NOTE — Discharge Instructions (Signed)
Use zofran as prescribed, as needed for nausea. Stay well hydrated with small sips of fluids throughout the day. Follow a BRAT (banana-rice-applesauce-toast) diet as described below for the next 24-48 hours. The 'BRAT' diet is suggested, then progress to diet as tolerated as symptoms abate. Call your primary care doctor if bloody stools, persistent diarrhea, vomiting, fever or abdominal pain. Follow up with your regular doctor in 3-4 days for ongoing care. Return to ER for changing or worsening of symptoms.  Food Choices to Help Relieve Diarrhea When you have diarrhea, the foods you eat and your eating habits are very important. Choosing the right foods and drinks can help relieve diarrhea. Also, because diarrhea can last up to 7 days, you need to replace lost fluids and electrolytes (such as sodium, potassium, and chloride) in order to help prevent dehydration.  WHAT GENERAL GUIDELINES DO I NEED TO FOLLOW?  Slowly drink 1 cup (8 oz) of fluid for each episode of diarrhea. If you are getting enough fluid, your urine will be clear or pale yellow.  Eat starchy foods. Some good choices include white rice, white toast, pasta, low-fiber cereal, baked potatoes (without the skin), saltine crackers, and bagels.  Avoid large servings of any cooked vegetables.  Limit fruit to two servings per day. A serving is  cup or 1 small piece.  Choose foods with less than 2 g of fiber per serving.  Limit fats to less than 8 tsp (38 g) per day.  Avoid fried foods.  Eat foods that have probiotics in them. Probiotics can be found in certain dairy products.  Avoid foods and beverages that may increase the speed at which food moves through the stomach and intestines (gastrointestinal tract). Things to avoid include:  High-fiber foods, such as dried fruit, raw fruits and vegetables, nuts, seeds, and whole grain foods.  Spicy foods and high-fat foods.  Foods and beverages sweetened with high-fructose corn syrup,  honey, or sugar alcohols such as xylitol, sorbitol, and mannitol. WHAT FOODS ARE RECOMMENDED? Grains White rice. White, [place], or pita breads (fresh or toasted), including plain rolls, buns, or bagels. White pasta. Saltine, soda, or graham crackers. Pretzels. Low-fiber cereal. Cooked cereals made with water (such as cornmeal, farina, or cream cereals). Plain muffins. Matzo. Melba toast. Zwieback.  Vegetables Potatoes (without the skin). Strained tomato and vegetable juices. Most well-cooked and canned vegetables without seeds. Tender lettuce. Fruits Cooked or canned applesauce, apricots, cherries, fruit cocktail, grapefruit, peaches, pears, or plums. Fresh bananas, apples without skin, cherries, grapes, cantaloupe, grapefruit, peaches, oranges, or plums.  Meat and Other Protein Products Baked or boiled chicken. Eggs. Tofu. Fish. Seafood. Smooth peanut butter. Ground or well-cooked tender beef, ham, veal, lamb, pork, or poultry.  Dairy Plain yogurt, kefir, and unsweetened liquid yogurt. Lactose-free milk, buttermilk, or soy milk. Plain hard cheese. Beverages Sport drinks. Clear broths. Diluted fruit juices (except prune). Regular, caffeine-free sodas such as ginger ale. Water. Decaffeinated teas. Oral rehydration solutions. Sugar-free beverages not sweetened with sugar alcohols. Other Bouillon, broth, or soups made from recommended foods.  The items listed above may not be a complete list of recommended foods or beverages. Contact your dietitian for more options. WHAT FOODS ARE NOT RECOMMENDED? Grains Whole grain, whole wheat, bran, or rye breads, rolls, pastas, crackers, and cereals. Wild or brown rice. Cereals that contain more than 2 g of fiber per serving. Corn tortillas or taco shells. Cooked or dry oatmeal. Granola. Popcorn. Vegetables Raw vegetables. Cabbage, broccoli, Brussels sprouts, artichokes, baked beans, beet  greens, corn, kale, legumes, peas, sweet potatoes, and yams. Potato  skins. Cooked spinach and cabbage. Fruits Dried fruit, including raisins and dates. Raw fruits. Stewed or dried prunes. Fresh apples with skin, apricots, mangoes, pears, raspberries, and strawberries.  Meat and Other Protein Products Chunky peanut butter. Nuts and seeds. Beans and lentils. Berniece Salines.  Dairy High-fat cheeses. Milk, chocolate milk, and beverages made with milk, such as milk shakes. Cream. Ice cream. Sweets and Desserts Sweet rolls, doughnuts, and sweet breads. Pancakes and waffles. Fats and Oils Butter. Cream sauces. Margarine. Salad oils. Plain salad dressings. Olives. Avocados.  Beverages Caffeinated beverages (such as coffee, tea, soda, or energy drinks). Alcoholic beverages. Fruit juices with pulp. Prune juice. Soft drinks sweetened with high-fructose corn syrup or sugar alcohols. Other Coconut. Hot sauce. Chili powder. Mayonnaise. Gravy. Cream-based or milk-based soups.  The items listed above may not be a complete list of foods and beverages to avoid. Contact your dietitian for more information. WHAT SHOULD I DO IF I BECOME DEHYDRATED? Diarrhea can sometimes lead to dehydration. Signs of dehydration include dark urine and dry mouth and skin. If you think you are dehydrated, you should rehydrate with an oral rehydration solution. These solutions can be purchased at pharmacies, retail stores, or online.  Drink -1 cup (120-240 mL) of oral rehydration solution each time you have an episode of diarrhea. If drinking this amount makes your diarrhea worse, try drinking smaller amounts more often. For example, drink 1-3 tsp (5-15 mL) every 5-10 minutes.  A general rule for staying hydrated is to drink 1-2 L of fluid per day. Talk to your health care provider about the specific amount you should be drinking each day. Drink enough fluids to keep your urine clear or pale yellow. Document Released: 04/21/2003 Document Revised: 02/03/2013 Document Reviewed: 12/22/2012 [facility] Patient  Information 2015 [place], [place]. This information is not intended to replace advice given to you by your health care provider. Make sure you discuss any questions you have with your health care provider.   Diarrhea Diarrhea is watery poop (stool). It can make you feel weak, tired, thirsty, or give you a dry mouth (signs of dehydration). Watery poop is a sign of another problem, most often an infection. It often lasts 2-3 days. It can last longer if it is a sign of something serious. Take care of yourself as told by your doctor. HOME CARE   Drink 1 cup (8 ounces) of fluid each time you have watery poop.  Do not drink the following fluids:  Those that contain simple sugars (fructose, glucose, galactose, lactose, sucrose, maltose).  Sports drinks.  Fruit juices.  Whole milk products.  Sodas.  Drinks with caffeine (coffee, tea, soda) or alcohol.  Oral rehydration solution may be used if the doctor says it is okay. You may make your own solution. Follow this recipe:   - teaspoon table salt.   teaspoon baking soda.   teaspoon salt substitute containing potassium chloride.  1 tablespoons sugar.  1 liter (34 ounces) of water.  Avoid the following foods:  High fiber foods, such as raw fruits and vegetables.  Nuts, seeds, and whole grain breads and cereals.   Those that are sweetened with sugar alcohols (xylitol, sorbitol, mannitol).  Try eating the following foods:  Starchy foods, such as rice, toast, pasta, low-sugar cereal, oatmeal, baked potatoes, crackers, and bagels.  Bananas.  Applesauce.  Eat probiotic-rich foods, such as yogurt and milk products that are fermented.  Wash your hands well after each  time you have watery poop.  Only take medicine as told by your doctor.  Take a warm bath to help lessen burning or pain from having watery poop. GET HELP RIGHT AWAY IF:   You cannot drink fluids without throwing up (vomiting).  You keep throwing up.  You have  blood in your poop, or your poop looks black and tarry.  You do not pee (urinate) in 6-8 hours, or there is only a small amount of very dark pee.  You have belly (abdominal) pain that gets worse or stays in the same spot (localizes).  You are weak, dizzy, confused, or light-headed.  You have a very bad headache.  Your watery poop gets worse or does not get better.  You have a fever or lasting symptoms for more than 2-3 days.  You have a fever and your symptoms suddenly get worse. MAKE SURE YOU:   Understand these instructions.  Will watch your condition.  Will get help right away if you are not doing well or get worse. Document Released: 07/18/2007 Document Revised: 06/15/2013 Document Reviewed: 10/07/2011 [facility] Patient Information 2015 [place], [place]. This information is not intended to replace advice given to you by your health care provider. Make sure you discuss any questions you have with your health care provider.   Nausea and Vomiting Nausea is a sick feeling that often comes before throwing up (vomiting). Vomiting is a reflex where stomach contents come out of your mouth. Vomiting can cause severe loss of body fluids (dehydration). Children and elderly adults can become dehydrated quickly, especially if they also have diarrhea. Nausea and vomiting are symptoms of a condition or disease. It is important to find the cause of your symptoms. CAUSES   Direct irritation of the stomach lining. This irritation can result from increased acid production (gastroesophageal reflux disease), infection, food poisoning, taking certain medicines (such as nonsteroidal anti-inflammatory drugs), alcohol use, or tobacco use.  Signals from the brain.These signals could be caused by a headache, heat exposure, an inner ear disturbance, increased pressure in the brain from injury, infection, a tumor, or a concussion, pain, emotional stimulus, or metabolic problems.  An obstruction in the  gastrointestinal tract (bowel obstruction).  Illnesses such as diabetes, hepatitis, gallbladder problems, appendicitis, kidney problems, cancer, sepsis, atypical symptoms of a heart attack, or eating disorders.  Medical treatments such as chemotherapy and radiation.  Receiving medicine that makes you sleep (general anesthetic) during surgery. DIAGNOSIS Your caregiver may ask for tests to be done if the problems do not improve after a few days. Tests may also be done if symptoms are severe or if the reason for the nausea and vomiting is not clear. Tests may include:  Urine tests.  Blood tests.  Stool tests.  Cultures (to look for evidence of infection).  X-rays or other imaging studies. Test results can help your caregiver make decisions about treatment or the need for additional tests. TREATMENT You need to stay well hydrated. Drink frequently but in small amounts.You may wish to drink water, sports drinks, clear broth, or eat frozen ice pops or gelatin dessert to help stay hydrated.When you eat, eating slowly may help prevent nausea.There are also some antinausea medicines that may help prevent nausea. HOME CARE INSTRUCTIONS   Take all medicine as directed by your caregiver.  If you do not have an appetite, do not force yourself to eat. However, you must continue to drink fluids.  If you have an appetite, eat a normal diet unless your caregiver  tells you differently.  Eat a variety of complex carbohydrates (rice, wheat, potatoes, bread), lean meats, yogurt, fruits, and vegetables.  Avoid high-fat foods because they are more difficult to digest.  Drink enough water and fluids to keep your urine clear or pale yellow.  If you are dehydrated, ask your caregiver for specific rehydration instructions. Signs of dehydration may include:  Severe thirst.  Dry lips and mouth.  Dizziness.  Dark urine.  Decreasing urine frequency and amount.  Confusion.  Rapid breathing or  pulse. SEEK IMMEDIATE MEDICAL CARE IF:   You have blood or brown flecks (like coffee grounds) in your vomit.  You have black or bloody stools.  You have a severe headache or stiff neck.  You are confused.  You have severe abdominal pain.  You have chest pain or trouble breathing.  You do not urinate at least once every 8 hours.  You develop cold or clammy skin.  You continue to vomit for longer than 24 to 48 hours.  You have a fever. MAKE SURE YOU:   Understand these instructions.  Will watch your condition.  Will get help right away if you are not doing well or get worse. Document Released: 01/29/2005 Document Revised: 04/23/2011 Document Reviewed: 06/28/2010 [facility] Patient Information 2015 [place], [place]. This information is not intended to replace advice given to you by your health care provider. Make sure you discuss any questions you have with your health care provider.    Viral Gastroenteritis Viral gastroenteritis is also known as stomach flu. This condition affects the stomach and intestinal tract. It can cause sudden diarrhea and vomiting. The illness typically lasts 3 to 8 days. Most people develop an immune response that eventually gets rid of the virus. While this natural response develops, the virus can make you quite ill. CAUSES  Many different viruses can cause gastroenteritis, such as rotavirus or noroviruses. You can catch one of these viruses by consuming contaminated food or water. You may also catch a virus by sharing utensils or other personal items with an infected person or by touching a contaminated surface. SYMPTOMS  The most common symptoms are diarrhea and vomiting. These problems can cause a severe loss of body fluids (dehydration) and a body salt (electrolyte) imbalance. Other symptoms may include:  Fever.  Headache.  Fatigue.  Abdominal pain. DIAGNOSIS  Your caregiver can usually diagnose viral gastroenteritis based on your symptoms and  a physical exam. A stool sample may also be taken to test for the presence of viruses or other infections. TREATMENT  This illness typically goes away on its own. Treatments are aimed at rehydration. The most serious cases of viral gastroenteritis involve vomiting so severely that you are not able to keep fluids down. In these cases, fluids must be given through an intravenous line (IV). HOME CARE INSTRUCTIONS   Drink enough fluids to keep your urine clear or pale yellow. Drink small amounts of fluids frequently and increase the amounts as tolerated.  Ask your caregiver for specific rehydration instructions.  Avoid:  Foods high in sugar.  Alcohol.  Carbonated drinks.  Tobacco.  Juice.  Caffeine drinks.  Extremely hot or cold fluids.  Fatty, greasy foods.  Too much intake of anything at one time.  Dairy products until 24 to 48 hours after diarrhea stops.  You may consume probiotics. Probiotics are active cultures of beneficial bacteria. They may lessen the amount and number of diarrheal stools in adults. Probiotics can be found in yogurt with active cultures and  in supplements.  Wash your hands well to avoid spreading the virus.  Only take over-the-counter or prescription medicines for pain, discomfort, or fever as directed by your caregiver. Do not give aspirin to children. Antidiarrheal medicines are not recommended.  Ask your caregiver if you should continue to take your regular prescribed and over-the-counter medicines.  Keep all follow-up appointments as directed by your caregiver. SEEK IMMEDIATE MEDICAL CARE IF:   You are unable to keep fluids down.  You do not urinate at least once every 6 to 8 hours.  You develop shortness of breath.  You notice blood in your stool or vomit. This may look like coffee grounds.  You have abdominal pain that increases or is concentrated in one small area (localized).  You have persistent vomiting or diarrhea.  You have a  fever.  The patient is a child younger than 3 months, and he or she has a fever.  The patient is a child older than 3 months, and he or she has a fever and persistent symptoms.  The patient is a child older than 3 months, and he or she has a fever and symptoms suddenly get worse.  The patient is a baby, and he or she has no tears when crying. MAKE SURE YOU:   Understand these instructions.  Will watch your condition.  Will get help right away if you are not doing well or get worse. Document Released: 01/29/2005 Document Revised: 04/23/2011 Document Reviewed: 11/15/2010 [facility] Patient Information 2015 [place], [place]. This information is not intended to replace advice given to you by your health care provider. Make sure you discuss any questions you have with your health care provider.

## 2014-08-03 NOTE — ED Notes (Signed)
Awake. Verbally responsive. A/O x4. Resp even and unlabored. No audible adventitious breath sounds noted. ABC's intact.  

## 2014-08-03 NOTE — ED Notes (Signed)
Patient ambulated to restroom and back to stretcher with assistance. Pt tolerated it well. Pt urinated but was not able to have a bowel movement for a stool sample.

## 2014-08-03 NOTE — ED Provider Notes (Signed)
Care assumed at shift change from [facility]. Pt here with 12hrs of n/v/d, her mother recently died from c.diff infection. Labs WNL, tolerating PO here, plan is to continue IV hydration and attempt to get stool sample. If pt unable to provide sample, likelihood for c.diff lessens, and pt could f/up with PCP.  Additional hx: pt states she hasn't had any episodes of diarrhea since 1:30am, and total she only had 3 episodes from onset until now. Has not had any diarrhea since arrival to ED.  Physical Exam  BP 133/81 mmHg  Pulse 98  Temp(Src) 98.2 F (36.8 C) (Oral)  Resp 18  Ht 5\' 2"  (1.575 m)  Wt 151 lb (68.493 kg)  BMI 27.61 kg/m2  SpO2 94%  Physical Exam Gen: afebrile, VSS, NAD HEENT: EOMI, MMM Resp: no resp distress CV: rate WNL Abd: Soft, NTND, +BS throughout, no r/g/r, neg murphy's, neg mcburney's, no CVA TTP  MsK: moving all extremities with ease Neuro: A&O x4  ED Course  Procedures Results for orders placed or performed during the hospital encounter of 08/03/14  CBC with Differential  Result Value Ref Range   WBC 9.3 4.0 - 10.5 K/uL   RBC 4.37 3.87 - 5.11 MIL/uL   Hemoglobin 12.9 12.0 - 15.0 g/dL   HCT 39.6 36.0 - 46.0 %   MCV 90.6 78.0 - 100.0 fL   MCH 29.5 26.0 - 34.0 pg   MCHC 32.6 30.0 - 36.0 g/dL   RDW 13.8 11.5 - 15.5 %   Platelets 232 150 - 400 K/uL   Neutrophils Relative % 71 43 - 77 %   Neutro Abs 6.7 1.7 - 7.7 K/uL   Lymphocytes Relative 19 12 - 46 %   Lymphs Abs 1.8 0.7 - 4.0 K/uL   Monocytes Relative 9 3 - 12 %   Monocytes Absolute 0.8 0.1 - 1.0 K/uL   Eosinophils Relative 1 0 - 5 %   Eosinophils Absolute 0.1 0.0 - 0.7 K/uL   Basophils Relative 0 0 - 1 %   Basophils Absolute 0.0 0.0 - 0.1 K/uL  Comprehensive metabolic panel  Result Value Ref Range   Sodium 135 135 - 145 mmol/L   Potassium 3.8 3.5 - 5.1 mmol/L   Chloride 99 (L) 101 - 111 mmol/L   CO2 25 22 - 32 mmol/L   Glucose, Bld 123 (H) 65 - 99 mg/dL   BUN 26 (H) 6 - 20 mg/dL   Creatinine, Ser 0.75 0.44 - 1.00 mg/dL   Calcium 9.3 8.9 - 10.3 mg/dL   Total Protein 6.9 6.5 - 8.1 g/dL   Albumin 4.5 3.5 - 5.0 g/dL   AST 26 15 - 41 U/L   ALT 21 14 - 54 U/L   Alkaline Phosphatase 45 38 - 126 U/L   Total Bilirubin 0.5 0.3 - 1.2 mg/dL   GFR calc non Af Amer >60 >60 mL/min   GFR calc Af Amer >60 >60 mL/min   Anion gap 11 5 - 15  Lipase, blood  Result Value Ref Range   Lipase 25 22 - 51 U/L  Urinalysis, Routine w reflex microscopic (not at [facility])  Result Value Ref Range   Color, Urine YELLOW YELLOW   APPearance CLEAR CLEAR   Specific Gravity, Urine 1.016 1.005 - 1.030   pH 6.0 5.0 - 8.0   Glucose, UA NEGATIVE NEGATIVE mg/dL   Hgb urine dipstick NEGATIVE NEGATIVE   Bilirubin Urine NEGATIVE NEGATIVE   Ketones, ur NEGATIVE NEGATIVE mg/dL  Protein, ur NEGATIVE NEGATIVE mg/dL   Urobilinogen, UA 0.2 0.0 - 1.0 mg/dL   Nitrite NEGATIVE NEGATIVE   Leukocytes, UA TRACE (A) NEGATIVE  Urine microscopic-add on  Result Value Ref Range   Squamous Epithelial / LPF RARE RARE   WBC, UA 3-6 <3 WBC/hpf   Bacteria, UA RARE RARE   Casts HYALINE CASTS (A) NEGATIVE  I-stat troponin, ED (only if pt is 74 y.o. or older & pain is above umbilicus)  not at [facility]  Result Value Ref Range   Troponin i, poc 0.02 0.00 - 0.08 ng/mL   Comment 3           No results found.   Meds ordered this encounter  Medications  . 0.9 %  sodium chloride infusion    Sig:   . ondansetron (ZOFRAN) injection 4 mg    Sig:   . ondansetron (ZOFRAN ODT) 4 MG disintegrating tablet    Sig: Take 1 tablet (4 mg total) by mouth every 8 (eight) hours as needed for nausea or vomiting.    Dispense:  15 tablet    Refill:  0    Order Specific Question:  Supervising Provider    Answer:  Noemi Chapel [[id number]]     MDM:   ICD-9-CM ICD-10-CM   1. Nausea vomiting and diarrhea 787.91 R11.2    787.01 R19.7   2. Generalized abdominal pain 789.07 R10.84   3. Gastroenteritis 558.9 K52.9     7:28 AM Pt without  any further diarrhea. Tolerating PO well. Will send home with zofran and discussed BRAT diet. Will have her f/up with PCP in 3-4 days, but doubt need for empiric treatment for bacterial etiologies. Likely a viral etiology or possibly food-related etiology. Discussed that if pt feels she could provide stool sample after d/c, to call her PCP and ask if they would like to obtain this to have stool studies performed. I explained the diagnosis and have given explicit precautions to return to the ER including for any other new or worsening symptoms. The patient understands and accepts the medical plan as it's been dictated and I have answered their questions. Discharge instructions concerning home care and prescriptions have been given. The patient is STABLE and is discharged to home in good condition.   [name] Cid E. Lopez, PA-C 08/03/14 [place], MD 08/03/14 [phone]

## 2014-08-03 NOTE — ED Notes (Signed)
Pt ambulated to the bathroom, pt had no loose stool

## 2014-08-03 NOTE — ED Provider Notes (Signed)
CSN: [id number]     Arrival date & time 08/03/14  0244 History   First MD Initiated Contact with Patient 08/03/14 0524     Chief Complaint  Patient presents with  . N/V/D      (Consider location/radiation/quality/duration/timing/severity/associated sxs/prior Treatment) HPI Comments: The patient started having nausea, vomiting and diarrhea yesterday around 5:00 pm. No fever. She has abdominal cramping periodically that is relieved with bowel movement. No blood in emesis or BM's. 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 Diff infection and is concerned that she contracted it.   The history is provided by the patient. No language interpreter was used.    Past Medical History  Diagnosis Date  . OSA (obstructive sleep apnea)   . Closed fracture of rib(s), unspecified   . 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 Esophageal reflux   . Osteoarthrosis, unspecified whether generalized or localized, unspecified site   . Chronic interstitial cystitis   . Personal history of other disorders of nervous system and sense organs   . Unspecified closed fracture of pelvis     x 2 2004/2006  . Disorder of bone and cartilage, unspecified   . Unspecified essential hypertension   . Other and unspecified hyperlipidemia   . Allergic rhinitis, cause unspecified   . Chronic insomnia   . Personal history of colonic adenomas 11/28/2012  . SAH (subarachnoid hemorrhage) 02/02/2014  . Cervical spine fracture 02/01/2014   Past Surgical History  Procedure Laterality Date  . Back surgery      ; has  had 8 back surgeries  . Cystoscopy    . Cervical fusion      C4-7  . Lumbar fusion      L2-3  . Hernia repair      umbilical  . Total hip arthroplasty Right 2012  . Tonsillectomy    . Tubal ligation    . Sacral fusion  07/2012  . Ankle fusion Left   . Tibia fracture surgery Right   . Colonoscopy    . Upper gastrointestinal  endoscopy     Family History  Problem Relation Age of Onset  . Dementia Mother   . Hypertension Father   . Heart attack Father   . Cancer Maternal Aunt     breast cancer  . Alzheimer's disease Maternal Aunt   . Stroke Paternal Uncle   . Cancer Cousin     breast cancer  . Colon cancer Neg Hx    History  Substance Use Topics  . Smoking status: Former Smoker -- 2.00 packs/day for 10 years    Types: Cigarettes    Quit date: 02/13/1967  . Smokeless tobacco: Never Used     Comment: smoked 1961-1969 , up to 3 ppd, mainly 2 ppd  . Alcohol Use: 2.4 oz/week    4 Glasses of wine per week   OB History    No data available     Review of Systems  Constitutional: Negative for fever and chills.  HENT: Negative.   Respiratory: Negative.   Cardiovascular: Negative.   Gastrointestinal: Positive for nausea, vomiting, abdominal pain and diarrhea. Negative for blood in stool and abdominal distention.  Genitourinary: Negative.   Musculoskeletal: Negative.  Negative for myalgias.  Skin: Negative.   Neurological: Negative.       Allergies  Resorcinol and Zolpidem  Home Medications   Prior to Admission medications   Medication Sig Start  Date End Date Taking? Authorizing Provider  Ascorbic Acid (VITAMIN C) 100 MG tablet Take 100 mg by mouth daily.      Historical Provider, MD  atorvastatin (LIPITOR) 40 MG tablet TAKE 1 TABLET ONCE DAILY. 07/02/14   Hendricks Limes, MD  Calcium Carbonate-Vitamin D (CALCIUM + D PO) Take by mouth daily.    Historical Provider, MD  celecoxib (CELEBREX) 200 MG capsule TAKE 1 CAP ONCE DAILY AS NEEDED--AVOID DAILY USE DUE TO GI AND CARDIAC POTENTIAL ADVERSE EFFECTS 07/02/14   Hendricks Limes, MD  cholecalciferol (VITAMIN D) 1000 UNITS tablet Take 1,000 Units by mouth daily.    Historical Provider, MD  cyclobenzaprine (FLEXERIL) 10 MG tablet Take 1 tablet (10 mg total) by mouth 3 (three) times daily as needed for muscle spasms. 02/04/14   Robbie Lis, MD   denosumab (PROLIA) 60 MG/ML SOLN injection Inject 60 mg into the skin every 6 (six) months. Administer in upper arm, thigh, or abdomen    Historical Provider, MD  EPINEPHrine (EPIPEN 2-PAK) 0.3 mg/0.3 mL IJ SOAJ injection Inject 0.3 mg into the muscle daily as needed. Allergic reaction    Historical Provider, MD  escitalopram (LEXAPRO) 20 MG tablet TAKE 1 TABLET ONCE DAILY. 03/30/14   Hendricks Limes, MD  ferrous sulfate 325 (65 FE) MG tablet Take 325 mg by mouth daily with breakfast.      Historical Provider, MD  fexofenadine (ALLEGRA) 180 MG tablet Take 180 mg by mouth daily.      Historical Provider, MD  lidocaine (LIDODERM) 5 % Place 1 patch onto the skin daily. Remove & Discard patch within 12 hours or as directed by MD. Apply over left lateral rib Patient taking differently: Place 1 patch onto the skin daily as needed (pain). Remove & Discard patch within 12 hours or as directed by MD. Apply over left lateral rib 11/19/13   Nishant Dhungel, MD  LORazepam (ATIVAN) 1 MG tablet TAKE 1-1&1/2 TABS AT BEDTIME IF NEEDED FOR SLEEP. 05/25/14   Deneise Lever, MD  losartan (COZAAR) 50 MG tablet TAKE 1 TABLET ONCE DAILY. 03/12/14   Hendricks Limes, MD  Misc Natural Products (OSTEO BI-FLEX TRIPLE STRENGTH PO) Take 1 tablet by mouth 2 (two) times daily.     Historical Provider, MD  Multiple Vitamin (MULTIVITAMIN PO) Take 1 tablet by mouth daily.     Historical Provider, MD  NEXIUM 40 MG capsule TAKE (1) CAPSULE DAILY. 03/12/14   Hendricks Limes, MD  pantoprazole (PROTONIX) 40 MG tablet Take 1 tablet (40 mg total) by mouth daily. 02/04/14   Robbie Lis, MD  Probiotic Product (ALIGN) 4 MG CAPS Take 4 mg by mouth daily.     Historical Provider, MD  spironolactone (ALDACTONE) 25 MG tablet TAKE 1 TABLET ONCE DAILY. 07/21/14   Hendricks Limes, MD  Teriparatide, Recombinant, (FORTEO [place]) Inject into the skin every 6 (six) months.     Historical Provider, MD  thiamine 100 MG tablet Take 1 tablet (100 mg total) by  mouth daily. 02/04/14   Robbie Lis, MD  traMADol Veatrice [place]) 50 MG tablet Take one tablet by mouth every 6 hours as needed for mild pain 02/15/14   [name] L Reed, DO  verapamil (CALAN-SR) 180 MG CR tablet TAKE ONE TABLET AT BEDTIME. 01/11/14   Hendricks Limes, MD   BP 133/81 mmHg  Pulse 98  Temp(Src) 98.2 F (36.8 C) (Oral)  Resp 18  Ht 5\' 2"  (1.575 m)  Wt  151 lb (68.493 kg)  BMI 27.61 kg/m2  SpO2 94% Physical Exam  Constitutional: She is oriented to person, place, and time. She appears well-developed and well-nourished. No distress.  HENT:  Head: Normocephalic.  Mouth/Throat: Mucous membranes are dry.  Eyes: Conjunctivae are normal.  Neck: Normal range of motion. Neck supple.  Cardiovascular: Normal rate and regular rhythm.   Pulmonary/Chest: Effort normal. She has no wheezes. She has no rales.  Abdominal: Soft. She exhibits no distension. There is no tenderness. There is no rebound and no guarding.  Musculoskeletal: Normal range of motion.  Neurological: She is alert and oriented to person, place, and time.  Skin: Skin is warm and dry. No rash noted.  Psychiatric: She has a normal mood and affect.    ED Course  Procedures (including critical care time) Labs Review Labs Reviewed  STOOL CULTURE  CBC WITH DIFFERENTIAL/PLATELET  COMPREHENSIVE METABOLIC PANEL  LIPASE, BLOOD  URINALYSIS, ROUTINE W REFLEX MICROSCOPIC (NOT AT [facility])  GI PATHOGEN PANEL BY PCR, STOOL  I-STAT TROPOININ, ED    Imaging Review No results found.   EKG Interpretation None      MDM   Final diagnoses:  None    1. N, V, D  Labs pending, IV fluids are running. No further vomiting in emergency department. She has not had another bowel movement here.  Patient care is transferred to oncoming PA-C at shift change.     Charlann Lange, PA-C 08/03/14 0542  Shanon Rosser, MD 08/03/14 [phone]

## 2014-08-03 NOTE — ED Notes (Signed)
Bed: EH63 Expected date:  Expected time:  Means of arrival:  Comments: EMS, N/V/D, exposure to c-diff

## 2014-08-06 ENCOUNTER — Other Ambulatory Visit: Payer: Self-pay | Admitting: Internal Medicine

## 2014-08-09 ENCOUNTER — Other Ambulatory Visit: Payer: Self-pay | Admitting: Internal Medicine

## 2014-08-09 DIAGNOSIS — M150 Primary generalized (osteo)arthritis: Secondary | ICD-10-CM | POA: Diagnosis not present

## 2014-08-09 DIAGNOSIS — G894 Chronic pain syndrome: Secondary | ICD-10-CM | POA: Diagnosis not present

## 2014-08-09 DIAGNOSIS — Z79891 Long term (current) use of opiate analgesic: Secondary | ICD-10-CM | POA: Diagnosis not present

## 2014-09-03 ENCOUNTER — Other Ambulatory Visit: Payer: Self-pay | Admitting: Internal Medicine

## 2014-09-03 NOTE — Telephone Encounter (Signed)
OK X1  My retirement date is 02/12/2015; but I will be in office on a limited schedule Oct-Dec. To guarantee continuity of care you should transition your care to another PCP by Oct 1,2016.     

## 2014-09-03 NOTE — Telephone Encounter (Signed)
Last seen 1/16, please advise

## 2014-09-06 DIAGNOSIS — M25511 Pain in right shoulder: Secondary | ICD-10-CM | POA: Diagnosis not present

## 2014-09-06 DIAGNOSIS — E559 Vitamin D deficiency, unspecified: Secondary | ICD-10-CM | POA: Diagnosis not present

## 2014-09-06 DIAGNOSIS — M810 Age-related osteoporosis without current pathological fracture: Secondary | ICD-10-CM | POA: Diagnosis not present

## 2014-09-06 DIAGNOSIS — R5383 Other fatigue: Secondary | ICD-10-CM | POA: Diagnosis not present

## 2014-09-10 ENCOUNTER — Other Ambulatory Visit: Payer: Self-pay | Admitting: Internal Medicine

## 2014-09-17 DIAGNOSIS — M19042 Primary osteoarthritis, left hand: Secondary | ICD-10-CM | POA: Diagnosis not present

## 2014-09-17 DIAGNOSIS — M19041 Primary osteoarthritis, right hand: Secondary | ICD-10-CM | POA: Diagnosis not present

## 2014-09-17 DIAGNOSIS — M810 Age-related osteoporosis without current pathological fracture: Secondary | ICD-10-CM | POA: Diagnosis not present

## 2014-09-20 ENCOUNTER — Other Ambulatory Visit: Payer: Self-pay | Admitting: Internal Medicine

## 2014-10-04 ENCOUNTER — Other Ambulatory Visit: Payer: Self-pay | Admitting: Internal Medicine

## 2014-10-04 DIAGNOSIS — G894 Chronic pain syndrome: Secondary | ICD-10-CM | POA: Diagnosis not present

## 2014-10-04 DIAGNOSIS — M961 Postlaminectomy syndrome, not elsewhere classified: Secondary | ICD-10-CM | POA: Diagnosis not present

## 2014-10-04 DIAGNOSIS — Z79891 Long term (current) use of opiate analgesic: Secondary | ICD-10-CM | POA: Diagnosis not present

## 2014-10-04 DIAGNOSIS — M150 Primary generalized (osteo)arthritis: Secondary | ICD-10-CM | POA: Diagnosis not present

## 2014-10-04 NOTE — Telephone Encounter (Signed)
OK X1  My retirement date is 02/12/2015; but I will be in office on a limited schedule Oct-Dec. To guarantee continuity of care you should transition your care to another PCP by Oct 1,2016.     

## 2014-10-04 NOTE — Telephone Encounter (Signed)
Last OV 1/16. Last refill 09/03/14 stated pt needed OV before further refills.  Please advise

## 2014-10-15 ENCOUNTER — Other Ambulatory Visit: Payer: Self-pay | Admitting: Internal Medicine

## 2014-10-22 DIAGNOSIS — M25511 Pain in right shoulder: Secondary | ICD-10-CM | POA: Diagnosis not present

## 2014-10-23 ENCOUNTER — Other Ambulatory Visit: Payer: Self-pay | Admitting: Internal Medicine

## 2014-10-25 ENCOUNTER — Other Ambulatory Visit: Payer: Self-pay | Admitting: Emergency Medicine

## 2014-10-25 MED ORDER — SPIRONOLACTONE 25 MG PO TABS: 25.0000 mg | ORAL_TABLET | Freq: Every day | ORAL | Status: DC

## 2014-10-27 ENCOUNTER — Encounter: Payer: Self-pay | Admitting: Internal Medicine

## 2014-10-29 DIAGNOSIS — M810 Age-related osteoporosis without current pathological fracture: Secondary | ICD-10-CM | POA: Diagnosis not present

## 2014-10-29 DIAGNOSIS — Z1231 Encounter for screening mammogram for malignant neoplasm of breast: Secondary | ICD-10-CM | POA: Diagnosis not present

## 2014-10-29 LAB — HM MAMMOGRAPHY

## 2014-11-05 ENCOUNTER — Encounter: Payer: Self-pay | Admitting: Internal Medicine

## 2014-11-08 ENCOUNTER — Other Ambulatory Visit: Payer: Self-pay | Admitting: Internal Medicine

## 2014-11-08 NOTE — Telephone Encounter (Signed)
Please advise, last OV 1/16 

## 2014-11-08 NOTE — Telephone Encounter (Signed)
  My retirement date is 02/12/2015; but I will be in office on a limited schedule Oct-Dec. To guarantee continuity of care you should transition your care to another PCP ASAP

## 2014-11-08 NOTE — Telephone Encounter (Signed)
Refill sent with md response...Johny Chess

## 2014-11-11 ENCOUNTER — Ambulatory Visit (INDEPENDENT_AMBULATORY_CARE_PROVIDER_SITE_OTHER): Payer: Medicare Other

## 2014-11-11 ENCOUNTER — Telehealth: Payer: Self-pay | Admitting: Internal Medicine

## 2014-11-11 DIAGNOSIS — J309 Allergic rhinitis, unspecified: Secondary | ICD-10-CM | POA: Diagnosis not present

## 2014-11-11 NOTE — Telephone Encounter (Signed)
Allergy Serum Extract Date Mixed: 11/11/14 Vial: 2 Strength: 1:50 Here/Mail/Pick Up: mail Mixed By: Laurette Schimke

## 2014-11-18 DIAGNOSIS — Z01419 Encounter for gynecological examination (general) (routine) without abnormal findings: Secondary | ICD-10-CM | POA: Diagnosis not present

## 2014-11-18 DIAGNOSIS — Z130 Encounter for screening for diseases of the blood and blood-forming organs and certain disorders involving the immune mechanism: Secondary | ICD-10-CM | POA: Diagnosis not present

## 2014-11-18 DIAGNOSIS — Z1389 Encounter for screening for other disorder: Secondary | ICD-10-CM | POA: Diagnosis not present

## 2014-11-18 DIAGNOSIS — Z124 Encounter for screening for malignant neoplasm of cervix: Secondary | ICD-10-CM | POA: Diagnosis not present

## 2014-11-25 NOTE — Progress Notes (Signed)
Cardiology Office Note   Date:  11/26/2014   ID:  [name], [name] 03/22/1940, MRN [id number]  PCP:  [name], MD  Cardiologist:   Sharol Harness, MD   Chief Complaint  Patient presents with  . New Evaluation    SOB--allergies//pt states no Sx.      History of Present Illness: Hailey [name] is a 74 y.o. female with hypertension, hyperlipidemia, subarachnoid hemorrhage, and alcohol abuse who presents to establish care.  She was previously a patient of Dr. Rex Kras.  She had a mechanical fall in December and subsequently had C1-C2 fused due to a fracture.  Since then her exercise has been limited to swimming.  She tries to swim a couple times per week for 30 minutes each time.  She denies any chest pain or shortness of breath with this activity but does feel short of breath after exercising.  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  She does note exertional dyspnea with walking long distances that is new this year.  Hailey [name] noted some post-operative lower extremity edema but this has resolved.  Hailey [name] has been on Verapamil for palpitations.  She reports that these are well-controlled.  Dr. Rex Kras also had her on Spironolactone as a diuretic.  She denies lightheadedness or dizziness.    She states that her diet is healthy overall.  Often, she doesn't feel like cooking so she just drinks Boost.   Past Medical History  Diagnosis Date  . OSA (obstructive sleep apnea)   . Closed fracture of rib(s), unspecified   . 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 Esophageal reflux   . Osteoarthrosis, unspecified whether generalized or localized, unspecified site   . Chronic interstitial cystitis   . Personal history of other disorders of nervous system and sense organs   . Unspecified closed fracture of pelvis     x 2 2004/2006  . Disorder of bone and cartilage, unspecified   . Unspecified essential  hypertension   . Other and unspecified hyperlipidemia   . Allergic rhinitis, cause unspecified   . Chronic insomnia   . Personal history of colonic adenomas 11/28/2012  . SAH (subarachnoid hemorrhage) ([place]) 02/02/2014  . Cervical spine fracture ([place]) 02/01/2014    Past Surgical History  Procedure Laterality Date  . Back surgery      ; has  had 8 back surgeries  . Cystoscopy    . Cervical fusion      C4-7  . Lumbar fusion      L2-3  . Hernia repair      umbilical  . Total hip arthroplasty Right 2012  . Tonsillectomy    . Tubal ligation    . Sacral fusion  07/2012  . Ankle fusion Left   . Tibia fracture surgery Right   . Colonoscopy    . Upper gastrointestinal endoscopy       Current Outpatient Prescriptions  Medication Sig Dispense Refill  . atorvastatin (LIPITOR) 40 MG tablet TAKE 1 TABLET ONCE DAILY. 90 tablet 1  . Calcium Carbonate-Vitamin D (CALCIUM + D PO) Take by mouth daily.    . celecoxib (CELEBREX) 200 MG capsule TAKE 1 CAP ONCE DAILY AS NEEDED--AVOID DAILY USE DUE TO GI AND CARDIAC POTENTIAL ADVERSE EFFECTS 30 capsule 0  . cholecalciferol (VITAMIN D) 1000 UNITS tablet Take 1,000 Units by mouth daily.    Marland Kitchen denosumab (PROLIA) 60 MG/ML  SOLN injection Inject 60 mg into the skin every 6 (six) months. Administer in upper arm, thigh, or abdomen    . EPINEPHrine (EPIPEN 2-PAK) 0.3 mg/0.3 mL IJ SOAJ injection Inject 0.3 mg into the muscle daily as needed. Allergic reaction    . escitalopram (LEXAPRO) 20 MG tablet TAKE 1 TABLET ONCE DAILY. 30 tablet 0  . ferrous sulfate 325 (65 FE) MG tablet Take 325 mg by mouth daily with breakfast.      . fexofenadine (ALLEGRA) 180 MG tablet Take 180 mg by mouth daily.      Marland Kitchen ibuprofen (ADVIL,MOTRIN) 200 MG tablet Take 200 mg by mouth every 6 (six) hours as needed.    Marland Kitchen LORazepam (ATIVAN) 1 MG tablet TAKE 1-1&1/2 TABS AT BEDTIME IF NEEDED FOR SLEEP. 50 tablet 5  . losartan (COZAAR) 50 MG tablet TAKE 1 TABLET ONCE DAILY. 90 tablet 0  .  Misc Natural Products (OSTEO BI-FLEX TRIPLE STRENGTH PO) Take 1 tablet by mouth 2 (two) times daily.     . Multiple Vitamin (MULTIVITAMIN PO) Take 1 tablet by mouth daily.     Marland Kitchen NEXIUM 40 MG capsule TAKE (1) CAPSULE DAILY. 90 capsule 0  . NONFORMULARY OR COMPOUNDED ITEM Allergy Vaccine 1:50 Given at Home    . ondansetron (ZOFRAN ODT) 4 MG disintegrating tablet Take 1 tablet (4 mg total) by mouth every 8 (eight) hours as needed for nausea or vomiting. 15 tablet 0  . Probiotic Product (ALIGN) 4 MG CAPS Take 4 mg by mouth daily.     Marland Kitchen spironolactone (ALDACTONE) 25 MG tablet Take 1 tablet (25 mg total) by mouth daily. ---- Needs office visit before further refills. 90 tablet 0  . Teriparatide, Recombinant, (FORTEO [place]) Inject into the skin every 6 (six) months.     . traMADol (ULTRAM) 50 MG tablet Take one tablet by mouth every 6 hours as needed for mild pain (Patient taking differently: Take 100 mg by mouth every 6 (six) hours as needed for moderate pain. Take one tablet by mouth every 6 hours as needed for mild pain) 120 tablet 5  . verapamil (CALAN-SR) 180 MG CR tablet TAKE ONE TABLET AT BEDTIME. 90 tablet 0   No current facility-administered medications for this visit.    Allergies:   Resorcinol and Zolpidem    Social History:  The patient  reports that she quit smoking about 47 years ago. Her smoking use included Cigarettes. She has a 20 pack-year smoking history. She has never used smokeless tobacco. She reports that she drinks about 2.4 oz of alcohol per week. She reports that she does not use illicit drugs.   Family History:  The patient's family history includes Alzheimer's disease in her maternal aunt; Cancer in her cousin and maternal aunt; Dementia in her mother; Heart attack in her father; Hypertension in her father; Stroke in her paternal uncle. There is no history of Colon cancer.    ROS:  Please see the history of present illness.   Otherwise, review of systems are positive for  none.   All other systems are reviewed and negative.    PHYSICAL EXAM: VS:  BP 94/62 mmHg  Pulse 83  Ht 5\' 6"  (1.676 m)  Wt 66.951 kg (147 lb 9.6 oz)  BMI 23.83 kg/m2 , BMI Body mass index is 23.83 kg/(m^2). GENERAL:  Well appearing HEENT:  Pupils equal round and reactive, fundi not visualized, oral mucosa unremarkable NECK:  No jugular venous distention, waveform within normal limits, carotid upstroke brisk  and symmetric, no bruits, no thyromegaly LYMPHATICS:  No cervical adenopathy LUNGS:  Clear to auscultation bilaterally HEART:  RRR.  PMI not displaced or sustained,S1 and S2 within normal limits, no S3, no S4, no clicks, no rubs, no murmurs ABD:  Flat, positive bowel sounds normal in frequency in pitch, no bruits, no rebound, no guarding, no midline pulsatile mass, no hepatomegaly, no splenomegaly EXT:  2 plus pulses throughout, no edema, no cyanosis no clubbing SKIN:  No rashes no nodules NEURO:  Cranial nerves II through XII grossly intact, motor grossly intact throughout PSYCH:  Cognitively intact, oriented to person place and time    EKG:  EKG is ordered today. The ekg ordered today demonstrates sinus rhythm at 83 BMP. Low voltage limb leads.  Prior inferior infarct.  L axis deviation.     Recent Labs: 08/03/2014: ALT 21; BUN 26*; Creatinine, Ser 0.75; Hemoglobin 12.9; Platelets 232; Potassium 3.8; Sodium 135    Lipid Panel    Component Value Date/Time   CHOL 178 08/14/2011 1415   TRIG 47.0 08/14/2011 1415   TRIG 40 12/25/2005 1439   HDL 68.60 08/14/2011 1415   CHOLHDL 3 08/14/2011 1415   CHOLHDL 2.8 CALC 12/25/2005 1439   VLDL 9.4 08/14/2011 1415   LDLCALC 100* 08/14/2011 1415   LDLDIRECT 170.9 03/20/2010 1356      Wt Readings from Last 3 Encounters:  11/26/14 66.951 kg (147 lb 9.6 oz)  08/03/14 68.493 kg (151 lb)  02/26/14 68.493 kg (151 lb)      ASSESSMENT AND PLAN:  # Exertional dyspnea: Symptoms could be due to emphysema, but it could also be  attributable to ischemia.  She has risk factors, including age, hypertension and hyperlipidemia.  She is unable to walk on a treadmill due to gait instability.  If her stress test is negative for ischemia, will refer for echo. - Lexiscan Myoview  # Carotid artery disease: She has known carotid artery disease and has not been evaluated in 10 years.  We will repeat carotid dopplers.  # Palpitations: Well-controlled on verapamil.  She denies every being told she has atrial fibrillation.  # Hyperlipidemia: She will fax her recent lipid panel.  Continue atorvastatin  # Hyper/hypotension:  Hailey [name] is on losartan and spironolactone.  She is also on verapamil.  Her BP is low today but she denies any complaints of dizziness.  We offered stopping the spironolactone but she declines.  Current medicines are reviewed at length with the patient today.  The patient does not have concerns regarding medicines.  The following changes have been made:  no change  Labs/ tests ordered today include:   Orders Placed This Encounter  Procedures  . Flu Vaccine QUAD 36+ mos IM  . Myocardial Perfusion Imaging  . EKG 12-Lead     Disposition:   FU with Hailey [name] C. Hailey Linsey, MD in 6 months    Signed, Sharol Harness, MD  11/26/2014 4:47 PM    [place]

## 2014-11-26 ENCOUNTER — Ambulatory Visit (INDEPENDENT_AMBULATORY_CARE_PROVIDER_SITE_OTHER): Payer: Medicare Other | Admitting: Cardiovascular Disease

## 2014-11-26 ENCOUNTER — Encounter: Payer: Self-pay | Admitting: Cardiovascular Disease

## 2014-11-26 VITALS — BP 94/62 | HR 83 | Ht 66.0 in | Wt 147.6 lb

## 2014-11-26 DIAGNOSIS — Z23 Encounter for immunization: Secondary | ICD-10-CM

## 2014-11-26 DIAGNOSIS — R0600 Dyspnea, unspecified: Secondary | ICD-10-CM

## 2014-11-26 DIAGNOSIS — I6523 Occlusion and stenosis of bilateral carotid arteries: Secondary | ICD-10-CM

## 2014-11-26 DIAGNOSIS — R0609 Other forms of dyspnea: Secondary | ICD-10-CM | POA: Diagnosis not present

## 2014-11-26 DIAGNOSIS — I10 Essential (primary) hypertension: Secondary | ICD-10-CM

## 2014-11-26 DIAGNOSIS — E785 Hyperlipidemia, unspecified: Secondary | ICD-10-CM | POA: Diagnosis not present

## 2014-11-26 NOTE — Patient Instructions (Signed)
Your physician has requested that you have a carotid duplex. This test is an ultrasound of the carotid arteries in your neck. It looks at blood flow through these arteries that supply the brain with blood. Allow one hour for this exam. There are no restrictions or special instructions.  Your physician has requested that you have a lexiscan myoview. For further information please visit [email or web address]. Please follow instruction sheet, as given.  Your physician wants you to follow-up in [place]. You will receive a reminder letter in the mail two months in advance. If you don't receive a letter, please call our office to schedule the follow-up appointment.

## 2014-12-03 ENCOUNTER — Telehealth (HOSPITAL_COMMUNITY): Payer: Self-pay

## 2014-12-03 NOTE — Telephone Encounter (Signed)
Encounter complete. 

## 2014-12-06 ENCOUNTER — Other Ambulatory Visit: Payer: Self-pay | Admitting: Internal Medicine

## 2014-12-06 NOTE — Telephone Encounter (Signed)
Ok to refill as before 

## 2014-12-06 NOTE — Telephone Encounter (Signed)
Ativan Last OV: 05/25/14 Last refill: 05/25/14 Next OV: 02/21/15  Ok to refill?

## 2014-12-07 ENCOUNTER — Ambulatory Visit (HOSPITAL_BASED_OUTPATIENT_CLINIC_OR_DEPARTMENT_OTHER)
Admission: RE | Admit: 2014-12-07 | Discharge: 2014-12-07 | Disposition: A | Discharge status: Home / Self Care | Payer: Medicare Other | Source: Ambulatory Visit | Attending: Cardiology | Admitting: Cardiology

## 2014-12-07 ENCOUNTER — Telehealth: Payer: Self-pay

## 2014-12-07 ENCOUNTER — Ambulatory Visit (HOSPITAL_COMMUNITY)
Admission: RE | Admit: 2014-12-07 | Discharge: 2014-12-07 | Disposition: A | Discharge status: Home / Self Care | Payer: Medicare Other | Source: Ambulatory Visit | Attending: Cardiology | Admitting: Cardiology

## 2014-12-07 ENCOUNTER — Telehealth: Payer: Self-pay | Admitting: Internal Medicine

## 2014-12-07 DIAGNOSIS — Z87891 Personal history of nicotine dependence: Secondary | ICD-10-CM | POA: Diagnosis not present

## 2014-12-07 DIAGNOSIS — R0609 Other forms of dyspnea: Secondary | ICD-10-CM | POA: Insufficient documentation

## 2014-12-07 DIAGNOSIS — R9439 Abnormal result of other cardiovascular function study: Secondary | ICD-10-CM | POA: Insufficient documentation

## 2014-12-07 DIAGNOSIS — I6523 Occlusion and stenosis of bilateral carotid arteries: Secondary | ICD-10-CM | POA: Insufficient documentation

## 2014-12-07 DIAGNOSIS — Z8249 Family history of ischemic heart disease and other diseases of the circulatory system: Secondary | ICD-10-CM | POA: Diagnosis not present

## 2014-12-07 DIAGNOSIS — E785 Hyperlipidemia, unspecified: Secondary | ICD-10-CM | POA: Diagnosis not present

## 2014-12-07 DIAGNOSIS — R002 Palpitations: Secondary | ICD-10-CM | POA: Insufficient documentation

## 2014-12-07 DIAGNOSIS — R0600 Dyspnea, unspecified: Secondary | ICD-10-CM

## 2014-12-07 DIAGNOSIS — I10 Essential (primary) hypertension: Secondary | ICD-10-CM | POA: Diagnosis not present

## 2014-12-07 LAB — MYOCARDIAL PERFUSION IMAGING
CHL CUP NUCLEAR SRS: 2
CHL CUP NUCLEAR SSS: 4
CSEPPHR: 90 {beats}/min
LVDIAVOL: 84 mL
LVSYSVOL: 27 mL
NUC STRESS TID: 1.1
Rest HR: 60 {beats}/min
SDS: 2

## 2014-12-07 MED ORDER — REGADENOSON 0.4 MG/5ML IV SOLN
0.4000 mg | Freq: Once | INTRAVENOUS | Status: AC
  Administered 2014-12-07: 0.4 mg via INTRAVENOUS

## 2014-12-07 MED ORDER — TECHNETIUM TC 99M SESTAMIBI GENERIC - CARDIOLITE
31.6000 | Freq: Once | INTRAVENOUS | Status: AC | PRN
  Administered 2014-12-07: 31.6 via INTRAVENOUS

## 2014-12-07 MED ORDER — ESCITALOPRAM OXALATE 20 MG PO TABS: 20.0000 mg | ORAL_TABLET | Freq: Every day | ORAL | Status: DC

## 2014-12-07 MED ORDER — TECHNETIUM TC 99M SESTAMIBI GENERIC - CARDIOLITE
10.7000 | Freq: Once | INTRAVENOUS | Status: AC | PRN
  Administered 2014-12-07: 10.7 via INTRAVENOUS

## 2014-12-07 NOTE — Telephone Encounter (Signed)
30

## 2014-12-07 NOTE — Telephone Encounter (Signed)
Please advise, thanks.

## 2014-12-07 NOTE — Telephone Encounter (Signed)
Sent to [place]

## 2014-12-07 NOTE — Telephone Encounter (Signed)
Patient has made an appointment to transfer care to [facility] on November 3rd.  Right now she is out of lexapro.  Can we send a script to [facility] at [place] to get her through until that time?

## 2014-12-08 ENCOUNTER — Telehealth: Payer: Self-pay | Admitting: *Deleted

## 2014-12-08 DIAGNOSIS — R0602 Shortness of breath: Secondary | ICD-10-CM

## 2014-12-08 NOTE — Telephone Encounter (Signed)
-----   Message from Skeet Latch, MD sent at 12/07/2014  5:05 PM EDT ----- Stress test shows that she may have had a heart attack in the past.  It was otherwise normal.  Please refer for echo to evaluate her shortness of breath.

## 2014-12-08 NOTE — Telephone Encounter (Signed)
Spoke to patient. Result given . Verbalized understanding Aware echo will be schedule  she had labs done at  Dr Percell Miller AND [facility]- will call and get results

## 2014-12-13 ENCOUNTER — Encounter: Payer: Self-pay | Admitting: Cardiovascular Disease

## 2014-12-13 DIAGNOSIS — I779 Disorder of arteries and arterioles, unspecified: Secondary | ICD-10-CM

## 2014-12-13 DIAGNOSIS — I739 Peripheral vascular disease, unspecified: Secondary | ICD-10-CM

## 2014-12-13 HISTORY — DX: Disorder of arteries and arterioles, unspecified: I77.9

## 2014-12-16 ENCOUNTER — Telehealth: Payer: Self-pay | Admitting: *Deleted

## 2014-12-16 ENCOUNTER — Ambulatory Visit (INDEPENDENT_AMBULATORY_CARE_PROVIDER_SITE_OTHER): Payer: Medicare Other | Admitting: Internal Medicine

## 2014-12-16 ENCOUNTER — Encounter: Payer: Self-pay | Admitting: Internal Medicine

## 2014-12-16 VITALS — BP 102/64 | HR 80 | Temp 98.3°F | Resp 12 | Ht 66.0 in | Wt 149.0 lb

## 2014-12-16 DIAGNOSIS — Z23 Encounter for immunization: Secondary | ICD-10-CM | POA: Diagnosis not present

## 2014-12-16 DIAGNOSIS — Z0000 Encounter for general adult medical examination without abnormal findings: Secondary | ICD-10-CM | POA: Diagnosis not present

## 2014-12-16 NOTE — Patient Instructions (Signed)
We are not changing your medicines today and do not need any blood work.   We have given you the tetanus shot today and the pneumonia shot.   Fall Prevention in the Home  Falls can cause injuries and can affect people from all age groups. There are many simple things that you can do to make your home safe and to help prevent falls. WHAT CAN I DO ON THE OUTSIDE OF MY HOME?  Regularly repair the edges of walkways and driveways and fix any cracks.  Remove high doorway thresholds.  Trim any shrubbery on the main path into your home.  Use bright outdoor lighting.  Clear walkways of debris and clutter, including tools and rocks.  Regularly check that handrails are securely fastened and in good repair. Both sides of any steps should have handrails.  Install guardrails along the edges of any raised decks or porches.  Have leaves, snow, and ice cleared regularly.  Use sand or salt on walkways during winter months.  In the garage, clean up any spills right away, including grease or oil spills. WHAT CAN I DO IN THE BATHROOM?  Use night lights.  Install grab bars by the toilet and in the tub and shower. Do not use towel bars as grab bars.  Use non-skid mats or decals on the floor of the tub or shower.  If you need to sit down while you are in the shower, use a plastic, non-slip stool.Marland Kitchen  Keep the floor dry. Immediately clean up any water that spills on the floor.  Remove soap buildup in the tub or shower on a regular basis.  Attach bath mats securely with double-sided non-slip rug tape.  Remove throw rugs and other tripping hazards from the floor. WHAT CAN I DO IN THE BEDROOM?  Use night lights.  Make sure that a bedside light is easy to reach.  Do not use oversized bedding that drapes onto the floor.  Have a firm chair that has side arms to use for getting dressed.  Remove throw rugs and other tripping hazards from the floor. WHAT CAN I DO IN THE KITCHEN?   Clean up any  spills right away.  Avoid walking on wet floors.  Place frequently used items in easy-to-reach places.  If you need to reach for something above you, use a sturdy step stool that has a grab bar.  Keep electrical cables out of the way.  Do not use floor polish or wax that makes floors slippery. If you have to use wax, make sure that it is non-skid floor wax.  Remove throw rugs and other tripping hazards from the floor. WHAT CAN I DO IN THE STAIRWAYS?  Do not leave any items on the stairs.  Make sure that there are handrails on both sides of the stairs. Fix handrails that are broken or loose. Make sure that handrails are as long as the stairways.  Check any carpeting to make sure that it is firmly attached to the stairs. Fix any carpet that is loose or worn.  Avoid having throw rugs at the top or bottom of stairways, or secure the rugs with carpet tape to prevent them from moving.  Make sure that you have a light switch at the top of the stairs and the bottom of the stairs. If you do not have them, have them installed. WHAT ARE SOME OTHER FALL PREVENTION TIPS?  Wear closed-toe shoes that fit well and support your feet. Wear shoes that have rubber soles  or low heels.  When you use a stepladder, make sure that it is completely opened and that the sides are firmly locked. Have someone hold the ladder while you are using it. Do not climb a closed stepladder.  Add color or contrast paint or tape to grab bars and handrails in your home. Place contrasting color strips on the first and last steps.  Use mobility aids as needed, such as canes, walkers, scooters, and crutches.  Turn on lights if it is dark. Replace any light bulbs that burn out.  Set up furniture so that there are clear paths. Keep the furniture in the same spot.  Fix any uneven floor surfaces.  Choose a carpet design that does not hide the edge of steps of a stairway.  Be aware of any and all pets.  Review your  medicines with your healthcare provider. Some medicines can cause dizziness or changes in blood pressure, which increase your risk of falling. Talk with your health care provider about other ways that you can decrease your risk of falls. This may include working with a physical therapist or trainer to improve your strength, balance, and endurance.   This information is not intended to replace advice given to you by your health care provider. Make sure you discuss any questions you have with your health care provider.   Document Released: 01/19/2002 Document Revised: 06/15/2014 Document Reviewed: 03/05/2014 Elsevier Interactive Patient Education [employer].

## 2014-12-16 NOTE — Telephone Encounter (Signed)
LEFT MESSAGE TO CALL BACK  RELEASE TO MY CHART  

## 2014-12-16 NOTE — Telephone Encounter (Signed)
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

## 2014-12-16 NOTE — Progress Notes (Signed)
Pre visit review using our clinic review tool, if applicable. No additional management support is needed unless otherwise documented below in the visit note. 

## 2014-12-19 ENCOUNTER — Encounter: Payer: Self-pay | Admitting: Internal Medicine

## 2014-12-19 DIAGNOSIS — Z0000 Encounter for general adult medical examination without abnormal findings: Secondary | ICD-10-CM | POA: Insufficient documentation

## 2014-12-19 NOTE — Progress Notes (Signed)
   Subjective:    Patient ID: Hailey [name], female    DOB: September 06, 1940, 74 y.o.   MRN: [id number]  HPI Here for medicare wellness, no new complaints. Please see A/P for status and treatment of chronic medical problems.   Diet: heart healthy Physical activity: sedentary Depression/mood screen: negative Hearing: intact to whispered voice Visual acuity: grossly normal, performs annual eye exam  ADLs: capable Fall risk: none Home safety: good Cognitive evaluation: intact to orientation, naming, recall and repetition EOL planning: adv directives discussed  I have personally reviewed and have noted 1. The patient's medical and social history - reviewed today no changes 2. Their use of alcohol, tobacco or illicit drugs 3. Their current medications and supplements 4. The patient's functional ability including ADL's, fall risks, home safety risks and hearing or visual impairment. 5. Diet and physical activities 6. Evidence for depression or mood disorders 7. Care team reviewed and updated (available in snapshot)  Review of Systems  Constitutional: Negative for fever, activity change, appetite change and fatigue.  HENT: Negative.   Eyes: Negative.   Respiratory: Negative for cough, chest tightness, shortness of breath and wheezing.   Cardiovascular: Negative for chest pain, palpitations and leg swelling.  Gastrointestinal: Negative.   Musculoskeletal: Positive for arthralgias. Negative for myalgias and neck pain.  Skin: Negative.   Neurological: Negative.   Psychiatric/Behavioral: Negative.       Objective:   Physical Exam  Constitutional: She is oriented to person, place, and time. She appears well-developed and well-nourished.  HENT:  Head: Normocephalic and atraumatic.  Eyes: EOM are normal.  Neck: Normal range of motion.  Cardiovascular: Normal rate and regular rhythm.   Pulmonary/Chest: Effort normal and breath sounds normal.  Abdominal: Soft. She exhibits no distension.  There is no tenderness. There is no rebound.  Musculoskeletal: She exhibits no edema.  Neurological: She is alert and oriented to person, place, and time.  Skin: Skin is warm and dry.  Psychiatric: She has a normal mood and affect.   Filed Vitals:   12/16/14 1608  BP: 102/64  Pulse: 80  Temp: 98.3 F (36.8 C)  TempSrc: Oral  Resp: 12  Height: 5\' 6"  (1.676 m)  Weight: 149 lb (67.586 kg)  SpO2: 97%      Assessment & Plan:  Tdap and flu shot given at visit.

## 2014-12-19 NOTE — Assessment & Plan Note (Signed)
Given flu and tdap at visit. Talked to her about fall prevention and safety in the home. Given 10 year screening recommendations. Colonoscopy up to date. Mammogram and DEXA up to date.

## 2014-12-23 ENCOUNTER — Ambulatory Visit (HOSPITAL_COMMUNITY): Payer: Medicare Other | Attending: Cardiology

## 2014-12-23 ENCOUNTER — Other Ambulatory Visit: Payer: Self-pay

## 2014-12-23 DIAGNOSIS — I10 Essential (primary) hypertension: Secondary | ICD-10-CM | POA: Diagnosis not present

## 2014-12-23 DIAGNOSIS — R0602 Shortness of breath: Secondary | ICD-10-CM | POA: Diagnosis not present

## 2014-12-23 DIAGNOSIS — I77819 Aortic ectasia, unspecified site: Secondary | ICD-10-CM | POA: Insufficient documentation

## 2014-12-23 DIAGNOSIS — I358 Other nonrheumatic aortic valve disorders: Secondary | ICD-10-CM | POA: Diagnosis not present

## 2014-12-23 DIAGNOSIS — I059 Rheumatic mitral valve disease, unspecified: Secondary | ICD-10-CM | POA: Diagnosis not present

## 2014-12-24 ENCOUNTER — Other Ambulatory Visit: Payer: Self-pay | Admitting: Internal Medicine

## 2014-12-30 ENCOUNTER — Encounter: Payer: Self-pay | Admitting: *Deleted

## 2014-12-30 ENCOUNTER — Telehealth: Payer: Self-pay | Admitting: *Deleted

## 2014-12-30 NOTE — Telephone Encounter (Signed)
-----   Message from Skeet Latch, MD sent at 12/29/2014  9:06 AM EST ----- Echo showed that her heart does not relax completely.  It is slightly stiff.  It will be important to control her blood pressure to prevent this from getting worse.  It is possible this is contributing to her shortness of breath.

## 2014-12-30 NOTE — Telephone Encounter (Signed)
Left message to call back - result echo Release to my chart

## 2014-12-30 NOTE — Telephone Encounter (Signed)
Spoke to patient. Result given . Verbalized understanding Request  a copy to be sent to her- done -

## 2015-01-07 ENCOUNTER — Other Ambulatory Visit: Payer: Self-pay | Admitting: Internal Medicine

## 2015-01-14 ENCOUNTER — Other Ambulatory Visit: Payer: Self-pay | Admitting: Internal Medicine

## 2015-01-17 DIAGNOSIS — G894 Chronic pain syndrome: Secondary | ICD-10-CM | POA: Diagnosis not present

## 2015-01-17 DIAGNOSIS — M961 Postlaminectomy syndrome, not elsewhere classified: Secondary | ICD-10-CM | POA: Diagnosis not present

## 2015-01-17 DIAGNOSIS — M150 Primary generalized (osteo)arthritis: Secondary | ICD-10-CM | POA: Diagnosis not present

## 2015-01-17 DIAGNOSIS — Z79891 Long term (current) use of opiate analgesic: Secondary | ICD-10-CM | POA: Diagnosis not present

## 2015-01-20 ENCOUNTER — Other Ambulatory Visit: Payer: Self-pay | Admitting: Internal Medicine

## 2015-01-21 ENCOUNTER — Other Ambulatory Visit: Payer: Self-pay | Admitting: Emergency Medicine

## 2015-01-21 MED ORDER — SPIRONOLACTONE 25 MG PO TABS: 25.0000 mg | ORAL_TABLET | Freq: Every day | ORAL | Status: DC

## 2015-01-31 NOTE — Telephone Encounter (Signed)
Erroneous

## 2015-02-14 ENCOUNTER — Other Ambulatory Visit: Payer: Self-pay | Admitting: Internal Medicine

## 2015-02-21 ENCOUNTER — Ambulatory Visit: Payer: Self-pay | Admitting: Internal Medicine

## 2015-02-24 ENCOUNTER — Other Ambulatory Visit: Payer: Self-pay | Admitting: *Deleted

## 2015-02-24 MED ORDER — LORAZEPAM 1 MG PO TABS: ORAL_TABLET | ORAL | Status: DC

## 2015-03-15 ENCOUNTER — Telehealth: Payer: Self-pay | Admitting: Internal Medicine

## 2015-03-15 ENCOUNTER — Other Ambulatory Visit: Payer: Self-pay | Admitting: Internal Medicine

## 2015-03-15 NOTE — Telephone Encounter (Signed)
Pt. Sent in her order form 03/14/15. Her lov was 01/16/2013. She cancelled 01/22/14,02/21/15 and didn't reschedule until 06/02/15. It looks like when she gets her vac. She cancels her appts.. She had 3 vaccines in 2015 and 2 in 2016. What do you want me to do? Please advise.

## 2015-03-15 NOTE — Telephone Encounter (Signed)
Tell her we will refill it this time, but no further refills until after she is seen for office visit to review.

## 2015-03-16 NOTE — Telephone Encounter (Signed)
Called pt. Gave her CDY's recs.: Pt. Understood we would not be sending any more vac. Until she sees him. She said she would be here for her appt. In April. Will hold encounter open to put in allergy vac.refill.

## 2015-03-17 DIAGNOSIS — J309 Allergic rhinitis, unspecified: Secondary | ICD-10-CM | POA: Diagnosis not present

## 2015-03-17 NOTE — Telephone Encounter (Signed)
Allergy Serum Extract Date Mixed: 03/17/15 Vial: 2 Strength: 1:10 Here/Mail/Pick Up: mail Mixed By: tbs Last OV: 06/28/14 Pending OV: 06/27/15

## 2015-03-22 DIAGNOSIS — R5383 Other fatigue: Secondary | ICD-10-CM | POA: Diagnosis not present

## 2015-03-22 DIAGNOSIS — M810 Age-related osteoporosis without current pathological fracture: Secondary | ICD-10-CM | POA: Diagnosis not present

## 2015-03-22 DIAGNOSIS — E559 Vitamin D deficiency, unspecified: Secondary | ICD-10-CM | POA: Diagnosis not present

## 2015-03-23 ENCOUNTER — Other Ambulatory Visit: Payer: Self-pay | Admitting: Internal Medicine

## 2015-03-25 DIAGNOSIS — M810 Age-related osteoporosis without current pathological fracture: Secondary | ICD-10-CM | POA: Diagnosis not present

## 2015-03-25 DIAGNOSIS — M47812 Spondylosis without myelopathy or radiculopathy, cervical region: Secondary | ICD-10-CM | POA: Diagnosis not present

## 2015-04-07 ENCOUNTER — Telehealth: Payer: Self-pay | Admitting: Internal Medicine

## 2015-04-07 NOTE — Telephone Encounter (Addendum)
Attempted to contact patient several times using different telephones in the office... When I call the number, it will not ring.  The phone just goes dead.  Spoke with Mariann Laster, she tried to contact patient as well in triage room, it did not ring and just went dead.  Will try to call back.

## 2015-04-07 NOTE — Telephone Encounter (Signed)
ATC pt as well per Michelle's request - no dial tone, no ringing, no option to LM.

## 2015-04-07 NOTE — Telephone Encounter (Addendum)
Attempted to contact patient on work phone, left message on voicemail for patient to call back before 5:30pm

## 2015-04-08 MED ORDER — PREDNISONE 10 MG PO TABS: ORAL_TABLET | ORAL | Status: DC

## 2015-04-08 NOTE — Telephone Encounter (Signed)
Called and spoke to pt. Pt c/o red, itchy, burning, raised hives on her back, torso, arms, legs, and neck x 10-14 days. Pt states she has been taking Benadryl 25mg  q4h and Allergra daily. Pt denies SOB, CP/tightness, f/c/s, cough, angioedema, and other obvious swelling.   Dr. Annamaria Boots please advise. Thanks.   Allergies  Allergen Reactions  . Resorcinol Swelling and Other (See Comments)    blisters    Current Outpatient Prescriptions on File Prior to Visit  Medication Sig Dispense Refill  . atorvastatin (LIPITOR) 40 MG tablet TAKE 1 TABLET ONCE DAILY. 90 tablet 1  . Calcium Carbonate-Vitamin D (CALCIUM + D PO) Take by mouth daily.    . celecoxib (CELEBREX) 200 MG capsule TAKE 1 CAP ONCE DAILY AS NEEDED--AVOID DAILY USE DUE TO GI AND CARDIAC POTENTIAL ADVERSE EFFECTS 30 capsule 0  . cholecalciferol (VITAMIN D) 1000 UNITS tablet Take 1,000 Units by mouth daily.    Marland Kitchen denosumab (PROLIA) 60 MG/ML SOLN injection Inject 60 mg into the skin every 6 (six) months. Administer in upper arm, thigh, or abdomen    . EPINEPHrine (EPIPEN 2-PAK) 0.3 mg/0.3 mL IJ SOAJ injection Inject 0.3 mg into the muscle daily as needed. Allergic reaction    . escitalopram (LEXAPRO) 20 MG tablet TAKE 1 TABLET ONCE DAILY. 90 tablet 1  . ferrous sulfate 325 (65 FE) MG tablet Take 325 mg by mouth daily with breakfast.      . fexofenadine (ALLEGRA) 180 MG tablet Take 180 mg by mouth daily.      Marland Kitchen ibuprofen (ADVIL,MOTRIN) 200 MG tablet Take 200 mg by mouth every 6 (six) hours as needed.    Marland Kitchen LORazepam (ATIVAN) 1 MG tablet TAKE 1-1&1/2 TABS AT BEDTIME IF NEEDED FOR SLEEP. 60 tablet 5  . losartan (COZAAR) 50 MG tablet TAKE 1 TABLET ONCE DAILY. 90 tablet 2  . Misc Natural Products (OSTEO BI-FLEX TRIPLE STRENGTH PO) Take 1 tablet by mouth 2 (two) times daily.     . Multiple Vitamin (MULTIVITAMIN PO) Take 1 tablet by mouth daily.     Marland Kitchen NEXIUM 40 MG capsule TAKE (1) CAPSULE DAILY. 90 capsule 1  . NONFORMULARY OR COMPOUNDED ITEM Allergy  Vaccine 1:50 Given at Home    . ondansetron (ZOFRAN ODT) 4 MG disintegrating tablet Take 1 tablet (4 mg total) by mouth every 8 (eight) hours as needed for nausea or vomiting. 15 tablet 0  . Probiotic Product (ALIGN) 4 MG CAPS Take 4 mg by mouth daily.     Marland Kitchen spironolactone (ALDACTONE) 25 MG tablet Take 1 tablet (25 mg total) by mouth daily. 90 tablet 1  . Teriparatide, Recombinant, (FORTEO [place]) Inject into the skin every 6 (six) months.     . traMADol (ULTRAM) 50 MG tablet Take one tablet by mouth every 6 hours as needed for mild pain (Patient taking differently: Take 100 mg by mouth every 6 (six) hours as needed for moderate pain. Take one tablet by mouth every 6 hours as needed for mild pain) 120 tablet 5  . verapamil (CALAN-SR) 180 MG CR tablet TAKE ONE TABLET AT BEDTIME. 90 tablet 2   No current facility-administered medications on file prior to visit.

## 2015-04-08 NOTE — Telephone Encounter (Signed)
Offer prednisone to get this under control.   Prednisone 109 mg, 3 20, 4 X 2 DAYS, 3 X 2 DAYS, 2 X 2 DAYS, 1 X 2 DAYS

## 2015-04-08 NOTE — Telephone Encounter (Signed)
Patient aware of Dr. Janee Morn recommendations. Rx sent to pharmacy. Nothing further needed.

## 2015-04-11 DIAGNOSIS — Z79891 Long term (current) use of opiate analgesic: Secondary | ICD-10-CM | POA: Diagnosis not present

## 2015-04-11 DIAGNOSIS — M961 Postlaminectomy syndrome, not elsewhere classified: Secondary | ICD-10-CM | POA: Diagnosis not present

## 2015-04-11 DIAGNOSIS — G894 Chronic pain syndrome: Secondary | ICD-10-CM | POA: Diagnosis not present

## 2015-04-11 DIAGNOSIS — M150 Primary generalized (osteo)arthritis: Secondary | ICD-10-CM | POA: Diagnosis not present

## 2015-04-13 ENCOUNTER — Other Ambulatory Visit: Payer: Self-pay | Admitting: Internal Medicine

## 2015-04-13 DIAGNOSIS — M542 Cervicalgia: Secondary | ICD-10-CM | POA: Diagnosis not present

## 2015-04-13 DIAGNOSIS — Z981 Arthrodesis status: Secondary | ICD-10-CM | POA: Diagnosis not present

## 2015-04-13 DIAGNOSIS — M47813 Spondylosis without myelopathy or radiculopathy, cervicothoracic region: Secondary | ICD-10-CM | POA: Diagnosis not present

## 2015-04-13 DIAGNOSIS — S12110D Anterior displaced Type II dens fracture, subsequent encounter for fracture with routine healing: Secondary | ICD-10-CM | POA: Diagnosis not present

## 2015-04-13 DIAGNOSIS — Z87891 Personal history of nicotine dependence: Secondary | ICD-10-CM | POA: Diagnosis not present

## 2015-04-13 DIAGNOSIS — M25519 Pain in unspecified shoulder: Secondary | ICD-10-CM | POA: Diagnosis not present

## 2015-04-20 ENCOUNTER — Other Ambulatory Visit: Payer: Self-pay | Admitting: Internal Medicine

## 2015-05-05 DIAGNOSIS — H04123 Dry eye syndrome of bilateral lacrimal glands: Secondary | ICD-10-CM | POA: Diagnosis not present

## 2015-05-05 DIAGNOSIS — H353131 Nonexudative age-related macular degeneration, bilateral, early dry stage: Secondary | ICD-10-CM | POA: Diagnosis not present

## 2015-05-05 DIAGNOSIS — H2513 Age-related nuclear cataract, bilateral: Secondary | ICD-10-CM | POA: Diagnosis not present

## 2015-05-05 DIAGNOSIS — H35372 Puckering of macula, left eye: Secondary | ICD-10-CM | POA: Diagnosis not present

## 2015-05-17 ENCOUNTER — Other Ambulatory Visit: Payer: Self-pay | Admitting: Internal Medicine

## 2015-06-02 ENCOUNTER — Ambulatory Visit (INDEPENDENT_AMBULATORY_CARE_PROVIDER_SITE_OTHER): Payer: Medicare Other | Admitting: Internal Medicine

## 2015-06-02 ENCOUNTER — Encounter: Payer: Self-pay | Admitting: Internal Medicine

## 2015-06-02 VITALS — BP 102/70 | HR 84 | Ht 68.25 in | Wt 137.0 lb

## 2015-06-02 DIAGNOSIS — L500 Allergic urticaria: Secondary | ICD-10-CM

## 2015-06-02 DIAGNOSIS — Z87891 Personal history of nicotine dependence: Secondary | ICD-10-CM

## 2015-06-02 DIAGNOSIS — J309 Allergic rhinitis, unspecified: Secondary | ICD-10-CM | POA: Diagnosis not present

## 2015-06-02 DIAGNOSIS — G4700 Insomnia, unspecified: Secondary | ICD-10-CM | POA: Diagnosis not present

## 2015-06-02 DIAGNOSIS — J302 Other seasonal allergic rhinitis: Secondary | ICD-10-CM

## 2015-06-02 DIAGNOSIS — J3089 Other allergic rhinitis: Secondary | ICD-10-CM

## 2015-06-02 NOTE — Assessment & Plan Note (Signed)
She continues allergy vaccine and believes it helps her. I discussed plans to end the allergy clinic at this office in 1 year. I suggested that after she stops shots here, she remained off for a while to see if she needs them, for getting retested as necessary at another office.

## 2015-06-02 NOTE — Patient Instructions (Addendum)
Suggest for hives: Continue doing what you normally do for now.  Starting about a week before your next Prolia injection, start H1 and H2 antihistamine therapy daily with H1- Allegra, benadryl,   H2 - Pepcid, tagamet or zantac  If you get hives again, consistent with getting the Prolia injection, then we will want to talk about options like Xolair injection therapy  Order-  Office spirometry    Dx former tobacco smoker

## 2015-06-02 NOTE — Progress Notes (Signed)
Subjective:    Patient ID: Hailey [name], female    DOB: 1940/08/23, 75 y.o.   MRN: [id number]  HPI 09/01/10-  27 yoF former smoker followed for allergic rhinitis, insomnia, complicated by DGD/ surgeries/ chronic pain, GERD    Here with aide carlene Last here 08/26/08 - note reviewed She wandered in sleep so she stopped ambien, but continues lorazepam . Never sleeps well. . She is also taking pain meds and agrees we should not add more sedating meds. . Just had extensive back surgery in January. Denies cough or dysuria. Was treated for UTI.  Today poor appetite and feels hot and cold. No fever, cough or dysuria and reports incision looks clean. She feels she is doing well with allergy vaccine. She has cat and we added cat to her serum and she feels that has worked.    01/18/12-  48 yoF former smoker followed for allergic rhinitis, insomnia, complicated by DGD/ surgeries/ chronic pain, GERD     FOLLOWS FOR: trouble sleeping-using Lorazepam and not helping to fall asleep; still on allergy vaccine and no troubles Allergic rhinitis manage with allergy vaccine 1:50 G0. She says this does well and she intends to continue. Only occasional need for supplemental antihistamine. Chronic insomnia remains a major problem, exacerbated by back pain/degenerative disc disease/multiple spine surgeries/orthopedics at [facility]. She uses lorazepam supplemented with gabapentin and oxycodone. She denies daytime sleepiness and says Ambien causes sleepwalking. She goes to bed after supper and watches TV until she drifts asleep. She then wakes up around 11 or 12 noon estimating 6 or 7 hours of sleep. We discussed sleep hygiene. She is spending about 15 hours in bed.  01/16/13-  13 yoF former smoker followed for allergic rhinitis, insomnia, complicated by DGD/ surgeries/ chronic pain, GERD  FOLLOWS FOR: uses Lunesta to help at times; still on allergy vaccine 1:50 GO, having sniffles. She feels allergy vaccine does well for her.  We discussed EpiPen refill. She again reminds me she has had a total of 8 back surgeries. Physical discomfort contributes to chronic insomnia. Lunesta usually works.  06/02/2015-75 year old female former smoker followed for allergic rhinitis, insomnia complicated by DGD/surgeries/chronic pain, GERD Allergy Vaccine 1:50 GO FOLLOWS FOR: Recently had hives that are now clearing up-? Prolia causing the hives(will speak with MD that gives Rx). Pt is still on allergy vaccine and no reactions.  Husband died of emphysema 10 years ago but she insists he always smoked in the garage Prior CXR had ?'d emphysema  New onset hives began 10 weeks ago at time of most recent Prolia injection. Her neurosurgeon told her to do anything she could to continue Prolia, after her 10th major surgery, mostly on her spine. She gets Prolia injection every 6 months. Urticaria continued for about 3 months after her last shot. Taking Benadryl and Zz Quill and Ambien 5 mg for sleep. Does well with this Office Spirometry 06/02/2015-normal-FVC 2.78/84%, FEV1 2.16/88%, FEV1/FVC 0.78, FEF 25-75 percent 1.97/101%.  ROS-see HPI Constitutional:   No-   weight loss, night sweats, fevers, chills, fatigue, lassitude. HEENT:   No-  headaches, difficulty swallowing, tooth/dental problems, sore throat,       No-  sneezing, itching, ear ache, nasal congestion, post nasal drip,  CV:  No-   chest pain, orthopnea, PND, swelling in lower extremities, anasarca, dizziness, palpitations Resp: No-   shortness of breath with exertion or at rest.              No-   productive cough,  No non-productive cough,  No- coughing up of blood.              No-   change in color of mucus.  No- wheezing.   Skin: No-   rash or lesions. GI:  No-   heartburn, indigestion, abdominal pain, nausea, vomiting, GU:  MS:  No-   joint pain or swelling.  .  + back pain. Neuro-     nothing unusual Psych:  No- change in mood or affect. No depression or anxiety.  No memory  loss.  Objective:   Physical Exam General- Alert, Oriented, Affect-appropriate/ calm, Distress- none acute   Thin  Skin- rash-none, lesions- none, excoriation- none Lymphadenopathy- none Head- atraumatic            Eyes- Gross vision intact, PERRLA, conjunctivae clear secretions                 Ears- Hearing, canals            Nose- Clear, No- Septal dev, mucus, polyps, erosion, perforation             Throat- Mallampati II , mucosa clear , drainage- none, tonsils- atrophic Neck- flexible , trachea midline, no stridor , thyroid nl, carotid no bruit Chest - symmetrical excursion , unlabored           Heart/CV- RRR , no murmur , no gallop  , no rub, nl s1 s2                           - JVD- none , edema- none, stasis changes- none, varices- none           Lung- clear, wheeze- none, cough- none , dullness-none, rub- none           Chest wall-  Abd-  Br/ Gen/ Rectal- Not done, not indicated Extrem- cyanosis- none, clubbing, none, atrophy- none, strength- nl Neuro- grossly intact to observation  Assessment & Plan:

## 2015-06-02 NOTE — Assessment & Plan Note (Signed)
She says she is doing well and comfortable with her combination of antihistamine and Ambien. We discussed side effects and options. She will continue for now.

## 2015-06-02 NOTE — Assessment & Plan Note (Signed)
Simple spirometry does not demonstrate significant obstructive airways disease at this visit

## 2015-06-02 NOTE — Assessment & Plan Note (Signed)
Best option would be to stop Prolia. If it is really important that she continue, then I recommended trial of H1 and H2 antihistamines starting about a week before her next Prolia injection. If she breaks through significantly, then Xolair therapy might be considered on.

## 2015-06-10 ENCOUNTER — Encounter: Payer: Self-pay | Admitting: Cardiovascular Disease

## 2015-06-10 ENCOUNTER — Ambulatory Visit (INDEPENDENT_AMBULATORY_CARE_PROVIDER_SITE_OTHER): Payer: Medicare Other | Admitting: Cardiovascular Disease

## 2015-06-10 VITALS — BP 97/68 | HR 78 | Ht 66.0 in | Wt 137.0 lb

## 2015-06-10 DIAGNOSIS — R0609 Other forms of dyspnea: Secondary | ICD-10-CM | POA: Diagnosis not present

## 2015-06-10 DIAGNOSIS — I10 Essential (primary) hypertension: Secondary | ICD-10-CM | POA: Diagnosis not present

## 2015-06-10 DIAGNOSIS — R0600 Dyspnea, unspecified: Secondary | ICD-10-CM

## 2015-06-10 DIAGNOSIS — I6523 Occlusion and stenosis of bilateral carotid arteries: Secondary | ICD-10-CM

## 2015-06-10 DIAGNOSIS — R002 Palpitations: Secondary | ICD-10-CM

## 2015-06-10 DIAGNOSIS — E785 Hyperlipidemia, unspecified: Secondary | ICD-10-CM | POA: Diagnosis not present

## 2015-06-10 NOTE — Progress Notes (Signed)
Cardiology Office Note   Date:  06/10/2015   ID:  Hailey [name], Hailey [name] Nov 01, 1940, MRN [id number]  PCP:  Hailey Koch, MD  Cardiologist:   Hailey Latch, MD   Chief Complaint  Patient presents with  . Follow-up    Carotid duplex/ stress test--6 months   pt states no Sx.      Patient ID:  Hailey [name] is a 75 y.o. female with hypertension, hyperlipidemia, subarachnoid hemorrhage, and alcohol abuse who presents for follow-up.  Interval history 06/10/15:  After her last appointment [name] was referred for [employer].  It showed an inferoapical perfusion defect at rest at with stress.  Sysotic function was Normal and no wall motion abnormalities. She also had carotid Dopplers that showed 1-39% and assist bilaterally.  She had an echo that showed grade 1 diastolic dysfunction and mild dilation of the ascending aorta. She has been feeling very well. In fact, she states this is the best she has felt in 10 years. Her only complaint is a rash that she has a 10 weeks after her last Prolia injection. She has seen an allergist and was diagnosed with hives. She is planning to take antihistamines prior to her next injection.  If that doesn't work she will undergo desensitization so that she can take this medicine.  [name] likes to swim for exercise. She stopped swimming when she had the hives because she was afraid the chlorine might be causing her rash. She has since restarted and does not have any recurrent hives. He denies any chest pain or shortness of breath with exercise. She has not noted any lower extremity edema, orthopnea, PND, palpitations, lightheadedness or dizziness. She has chronic pain in her hip, knees, and back.  However these have been much more tolerable lately.  History of Present Illness 11/25/14: She was previously a patient of Dr. Rex [name].  She had a mechanical fall in December and subsequently had C1-C2 fused due to a fracture.  Since then her exercise  has been limited to swimming.  She tries to swim a couple times per week for 30 minutes each time.  She denies any chest pain or shortness of breath with this activity but does feel short of breath after exercising.  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  She does note exertional dyspnea with walking long distances that is new this year.  [name] noted some post-operative lower extremity edema but this has resolved.  [name] has been on Verapamil for palpitations.  She reports that these are well-controlled.  Dr. Rex [name] also had her on Spironolactone as a diuretic.  She denies lightheadedness or dizziness.    She states that her diet is healthy overall.  Often, she doesn't feel like cooking so she just drinks Boost.   Past Medical History  Diagnosis Date  . OSA (obstructive sleep apnea)   . Closed fracture of rib(s), unspecified   . 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 Esophageal reflux   . Osteoarthrosis, unspecified whether generalized or localized, unspecified site   . Chronic interstitial cystitis   . Personal history of other disorders of nervous system and sense organs   . Unspecified closed fracture of pelvis     x 2 2004/2006  . Disorder of bone and cartilage, unspecified   . Unspecified essential hypertension   . Other and unspecified hyperlipidemia   . Allergic  rhinitis, cause unspecified   . Chronic insomnia   . Personal history of colonic adenomas 11/28/2012  . SAH (subarachnoid hemorrhage) ([place]) 02/02/2014  . Cervical spine fracture ([place]) 02/01/2014  . Carotid artery disease ([place]) 12/13/2014    1-39% stenosis of bilateral CCAs.   Repeat ultrasound 11/2016.    Past Surgical History  Procedure Laterality Date  . Back surgery      ; has  had 8 back surgeries  . Cystoscopy    . Cervical fusion      C4-7  . Lumbar fusion      L2-3  . Hernia repair      umbilical  . Total hip arthroplasty  Right 2012  . Tonsillectomy    . Tubal ligation    . Sacral fusion  07/2012  . Ankle fusion Left   . Tibia fracture surgery Right   . Colonoscopy    . Upper gastrointestinal endoscopy       Current Outpatient Prescriptions  Medication Sig Dispense Refill  . atorvastatin (LIPITOR) 40 MG tablet TAKE 1 TABLET ONCE DAILY. 90 tablet 0  . Calcium Carbonate-Vitamin D (CALCIUM + D PO) Take by mouth daily.    . celecoxib (CELEBREX) 200 MG capsule TAKE 1 CAP ONCE DAILY AS NEEDED--AVOID DAILY USE DUE TO GI AND CARDIAC POTENTIAL ADVERSE EFFECTS 30 capsule 0  . cholecalciferol (VITAMIN D) 1000 UNITS tablet Take 1,000 Units by mouth daily.    Marland Kitchen denosumab (PROLIA) 60 MG/ML SOLN injection Inject 60 mg into the skin every 6 (six) months. Administer in upper arm, thigh, or abdomen    . diphenhydrAMINE (BENADRYL) 25 mg capsule Take 25 mg by mouth 2 (two) times daily.    Marland Kitchen EPINEPHrine (EPIPEN 2-PAK) 0.3 mg/0.3 mL IJ SOAJ injection Inject 0.3 mg into the muscle daily as needed. Allergic reaction    . escitalopram (LEXAPRO) 20 MG tablet TAKE 1 TABLET ONCE DAILY. 90 tablet 1  . ferrous sulfate 325 (65 FE) MG tablet Take 325 mg by mouth daily with breakfast.      . fexofenadine (ALLEGRA) 180 MG tablet Take 180 mg by mouth daily.      Marland Kitchen LORazepam (ATIVAN) 1 MG tablet TAKE 1-1&1/2 TABS AT BEDTIME IF NEEDED FOR SLEEP. 60 tablet 5  . losartan (COZAAR) 50 MG tablet TAKE 1 TABLET ONCE DAILY. 90 tablet 2  . Misc Natural Products (OSTEO BI-FLEX TRIPLE STRENGTH PO) Take 1 tablet by mouth 2 (two) times daily.     . Multiple Vitamin (MULTIVITAMIN PO) Take 1 tablet by mouth daily.     . NONFORMULARY OR COMPOUNDED ITEM Allergy Vaccine 1:50 Given at Home    . ondansetron (ZOFRAN ODT) 4 MG disintegrating tablet Take 1 tablet (4 mg total) by mouth every 8 (eight) hours as needed for nausea or vomiting. 15 tablet 0  . Probiotic Product (ALIGN) 4 MG CAPS Take 4 mg by mouth daily.     . ranitidine (ZANTAC) 75 MG tablet Take 75  mg by mouth daily.    Marland Kitchen spironolactone (ALDACTONE) 25 MG tablet Take 1 tablet (25 mg total) by mouth daily. 90 tablet 1  . Teriparatide, Recombinant, (FORTEO [place]) Inject into the skin every 6 (six) months.     . verapamil (CALAN-SR) 180 MG CR tablet TAKE ONE TABLET AT BEDTIME. 90 tablet 2   No current facility-administered medications for this visit.    Allergies:   Resorcinol and Other    Social History:  The patient  reports that she quit  smoking about 48 years ago. Her smoking use included Cigarettes. She has a 20 pack-year smoking history. She has never used smokeless tobacco. She reports that she drinks about 2.4 oz of alcohol per week. She reports that she does not use illicit drugs.   Family History:  The patient's family history includes Alzheimer's disease in her maternal aunt; Cancer in her cousin and maternal aunt; Dementia in her mother; Heart attack in her father; Hypertension in her father; Stroke in her paternal uncle. There is no history of Colon cancer.    ROS:  Please see the history of present illness.   Otherwise, review of systems are positive for none.   All other systems are reviewed and negative.    PHYSICAL EXAM: VS:  BP 97/68 mmHg  Pulse 78  Ht 5\' 6"  (1.676 m)  Wt 62.143 kg (137 lb)  BMI 22.12 kg/m2 , BMI Body mass index is 22.12 kg/(m^2). GENERAL:  Well appearing HEENT:  Pupils equal round and reactive, fundi not visualized, oral mucosa unremarkable NECK:  No jugular venous distention, waveform within normal limits, carotid upstroke brisk and symmetric, no bruits, no thyromegaly LYMPHATICS:  No cervical adenopathy LUNGS:  Clear to auscultation bilaterally HEART:  RRR.  PMI not displaced or sustained,S1 and S2 within normal limits, no S3, no S4, no clicks, no rubs, no murmurs ABD:  Flat, positive bowel sounds normal in frequency in pitch, no bruits, no rebound, no guarding, no midline pulsatile mass, no hepatomegaly, no splenomegaly EXT:  2 plus pulses  throughout, no edema, no cyanosis no clubbing SKIN:  No rashes no nodules NEURO:  Cranial nerves II through XII grossly intact, motor grossly intact throughout PSYCH:  Cognitively intact, oriented to person place and time   EKG:  EKG is not ordered today.  11/26/14:sinus rhythm at 83 BMP. Low voltage limb leads.  Prior inferior infarct.  L axis deviation.    Echo 12/23/14: Study Conclusions  - Left ventricle: The cavity size was normal. Wall thickness was  normal. Systolic function was normal. The estimated ejection  fraction was in the range of 55% to 60%. Wall motion was normal;  there were no regional wall motion abnormalities. Doppler  parameters are consistent with abnormal left ventricular  relaxation (grade 1 diastolic dysfunction). - Aortic valve: Trileaflet; mildly thickened, mildly calcified  leaflets. - Aorta: The aorta was mildly dilated. Ascending aortic diameter:  38 mm (S). - Mitral valve: Calcified annulus.  Lexiscan Cardiolite 12/07/14:  Nuclear stress EF: 67%.  The left ventricular ejection fraction is hyperdynamic (>65%).  There was no ST segment deviation noted during stress.  This is a low risk study.  Technically suboptimal due to subdiaphragmatic activity; low risk stress nuclear study with a medium size, severe intensity, fixed inferior apical defect suggestive of prior infarct; no ischemia; EF 67 with normal wall motion.  Carotid Doppler 12/07/14:   1-39% stenosis of bilateral common carotid arteries.  recommended follow-up 11/2016.  Re cent Labs: 08/03/2014: ALT 21; BUN 26*; Creatinine, Ser 0.75; Hemoglobin 12.9; Platelets 232; Potassium 3.8; Sodium 135    Lipid Panel    Component Value Date/Time   CHOL 178 08/14/2011 1415   TRIG 47.0 08/14/2011 1415   TRIG 40 12/25/2005 1439   HDL 68.60 08/14/2011 1415   CHOLHDL 3 08/14/2011 1415   CHOLHDL 2.8 CALC 12/25/2005 1439   VLDL 9.4 08/14/2011 1415   LDLCALC 100* 08/14/2011 1415    LDLDIRECT 170.9 03/20/2010 1356      Wt Readings from  Last 3 Encounters:  06/10/15 62.143 kg (137 lb)  06/02/15 62.143 kg (137 lb)  12/16/14 67.586 kg (149 lb)      ASSESSMENT AND PLAN:  # Exertional dyspnea: Symptoms have improved significantly.  She had a low risk Cardiolite and echo showed grade 1 diastolic dysfunction.  I suspect that her dyspnea is unrelated to her heart.  # Carotid artery disease: Carotid duplex showed mild disease bilaterally.  Repeat 11/2016.  Continue atorvastatin.  Start aspirin at next appointment.  # Palpitations: Well-controlled on verapamil.    # Hyperlipidemia:  Continue atorvastatin.  # Hypertension:  Ms. Gurrero is on losartan, verapamil, and spironolactone.  She is also on verapamil.  Her BP remains low today but she denies any complaints of dizziness.  We offered stopping the spironolactone but she declines.  Current medicines are reviewed at length with the patient today.  The patient does not have concerns regarding medicines.  The following changes have been made:  no change  Labs/ tests ordered today include:   No orders of the defined types were placed in this encounter.     Disposition:   FU with Keileigh Vahey C. Oval Linsey, MD in 6 months    Signed, Hailey Latch, MD  06/10/2015 3:50 PM    [place]

## 2015-06-10 NOTE — Patient Instructions (Signed)

## 2015-06-11 ENCOUNTER — Encounter: Payer: Self-pay | Admitting: Cardiovascular Disease

## 2015-06-14 ENCOUNTER — Other Ambulatory Visit (INDEPENDENT_AMBULATORY_CARE_PROVIDER_SITE_OTHER): Payer: Medicare Other

## 2015-06-14 ENCOUNTER — Ambulatory Visit (INDEPENDENT_AMBULATORY_CARE_PROVIDER_SITE_OTHER): Payer: Medicare Other | Admitting: Internal Medicine

## 2015-06-14 ENCOUNTER — Encounter: Payer: Self-pay | Admitting: Internal Medicine

## 2015-06-14 VITALS — BP 112/62 | HR 69 | Temp 97.9°F | Ht 66.0 in | Wt 138.0 lb

## 2015-06-14 DIAGNOSIS — E559 Vitamin D deficiency, unspecified: Secondary | ICD-10-CM | POA: Diagnosis not present

## 2015-06-14 DIAGNOSIS — I10 Essential (primary) hypertension: Secondary | ICD-10-CM

## 2015-06-14 DIAGNOSIS — E785 Hyperlipidemia, unspecified: Secondary | ICD-10-CM

## 2015-06-14 DIAGNOSIS — I6523 Occlusion and stenosis of bilateral carotid arteries: Secondary | ICD-10-CM | POA: Diagnosis not present

## 2015-06-14 LAB — CBC
HCT: 36.7 % (ref 36.0–46.0)
HEMOGLOBIN: 12.4 g/dL (ref 12.0–15.0)
MCHC: 33.9 g/dL (ref 30.0–36.0)
MCV: 89.2 fl (ref 78.0–100.0)
Platelets: 219 10*3/uL (ref 150.0–400.0)
RBC: 4.11 Mil/uL (ref 3.87–5.11)
RDW: 14.4 % (ref 11.5–15.5)
WBC: 5.3 10*3/uL (ref 4.0–10.5)

## 2015-06-14 LAB — VITAMIN D 25 HYDROXY (VIT D DEFICIENCY, FRACTURES): VITD: 61.51 ng/mL (ref 30.00–100.00)

## 2015-06-14 LAB — LIPID PANEL
CHOLESTEROL: 166 mg/dL (ref 0–200)
HDL: 42.9 mg/dL (ref >39.00)
LDL CALC: 102 mg/dL — AB (ref 0–99)
NonHDL: 122.82
Total CHOL/HDL Ratio: 4
Triglycerides: 102 mg/dL (ref 0.0–149.0)
VLDL: 20.4 mg/dL (ref 0.0–40.0)

## 2015-06-14 LAB — COMPREHENSIVE METABOLIC PANEL
ALBUMIN: 4.3 g/dL (ref 3.5–5.2)
ALK PHOS: 38 U/L — AB (ref 39–117)
ALT: 11 U/L (ref 0–35)
AST: 18 U/L (ref 0–37)
BUN: 13 mg/dL (ref 6–23)
CHLORIDE: 98 meq/L (ref 96–112)
CO2: 29 mEq/L (ref 19–32)
CREATININE: 0.86 mg/dL (ref 0.40–1.20)
Calcium: 9.5 mg/dL (ref 8.4–10.5)
GFR: 68.43 mL/min (ref >60.00)
Glucose, Bld: 110 mg/dL — ABNORMAL HIGH (ref 70–99)
Potassium: 4.4 mEq/L (ref 3.5–5.1)
SODIUM: 133 meq/L — AB (ref 135–145)
TOTAL PROTEIN: 6.6 g/dL (ref 6.0–8.3)
Total Bilirubin: 0.4 mg/dL (ref 0.2–1.2)

## 2015-06-14 MED ORDER — RANITIDINE HCL 150 MG PO TABS: 150.0000 mg | ORAL_TABLET | Freq: Two times a day (BID) | ORAL | Status: DC

## 2015-06-14 NOTE — Progress Notes (Signed)
Pre visit review using our clinic review tool, if applicable. No additional management support is needed unless otherwise documented below in the visit note. 

## 2015-06-14 NOTE — Patient Instructions (Signed)
We have sent in the ranitidine (zantac) to [facility].  We are checking the labs today and will call you back with the results.   Come back in about 6 months for a physical.

## 2015-06-15 ENCOUNTER — Encounter: Payer: Self-pay | Admitting: Internal Medicine

## 2015-06-15 NOTE — Assessment & Plan Note (Signed)
BP at goal on spironolactone and verapamil and losartan. Checking CMP and adjust as needed.

## 2015-06-15 NOTE — Assessment & Plan Note (Signed)
Checking lipid panel and adjust as needed. Taking lipitor 40 mg daily without side effect.

## 2015-06-15 NOTE — Progress Notes (Signed)
   Subjective:    Patient ID: Hailey [name], female    DOB: Jul 24, 1940, 75 y.o.   MRN: [id number]  HPI The patient is a 75 YO female coming in for follow up of her blood pressure. She is taking her losartan, spironolactone, verapamil for the pressure. Denies any side effects. Trying to exercise but has not changed her diet. She is not having chest pains, SOB, headaches. Doing well overall. The rash from last visit is now gone and she went to several dermatologists trying to get rid of it.   Review of Systems  Constitutional: Negative for fever, activity change, appetite change and fatigue.  HENT: Negative.   Eyes: Negative.   Respiratory: Negative for cough, chest tightness, shortness of breath and wheezing.   Cardiovascular: Negative for chest pain, palpitations and leg swelling.  Gastrointestinal: Negative.   Musculoskeletal: Positive for arthralgias. Negative for myalgias and neck pain.  Skin: Negative.   Neurological: Negative.   Psychiatric/Behavioral: Negative.       Objective:   Physical Exam  Constitutional: She is oriented to person, place, and time. She appears well-developed and well-nourished.  HENT:  Head: Normocephalic and atraumatic.  Eyes: EOM are normal.  Neck: Normal range of motion.  Cardiovascular: Normal rate and regular rhythm.   Pulmonary/Chest: Effort normal and breath sounds normal.  Abdominal: Soft. She exhibits no distension. There is no tenderness. There is no rebound.  Musculoskeletal: She exhibits no edema.  Neurological: She is alert and oriented to person, place, and time.  Skin: Skin is warm and dry.  Psychiatric: She has a normal mood and affect.   Filed Vitals:   06/14/15 1613  BP: 112/62  Pulse: 69  Temp: 97.9 F (36.6 C)  TempSrc: Oral  Height: 5\' 6"  (1.676 m)  Weight: 138 lb (62.596 kg)  SpO2: 96%      Assessment & Plan:

## 2015-06-16 ENCOUNTER — Ambulatory Visit: Payer: Self-pay | Admitting: Internal Medicine

## 2015-06-16 ENCOUNTER — Other Ambulatory Visit: Payer: Self-pay | Admitting: Internal Medicine

## 2015-06-24 ENCOUNTER — Telehealth: Payer: Self-pay | Admitting: Internal Medicine

## 2015-06-24 DIAGNOSIS — J309 Allergic rhinitis, unspecified: Secondary | ICD-10-CM | POA: Diagnosis not present

## 2015-06-24 NOTE — Telephone Encounter (Signed)
Allergy Serum Extract Date Mixed: 06/24/15 Vial: 2 Strength: 1:50 Here/Mail/Pick Up: mail Mixed By: tbs Last OV: 06/02/15 Pending OV: 11/07/15

## 2015-07-11 ENCOUNTER — Other Ambulatory Visit: Payer: Self-pay | Admitting: Internal Medicine

## 2015-07-12 ENCOUNTER — Other Ambulatory Visit: Payer: Self-pay | Admitting: Internal Medicine

## 2015-07-12 NOTE — Telephone Encounter (Signed)
Sent to pharmacy 

## 2015-07-20 ENCOUNTER — Other Ambulatory Visit: Payer: Self-pay | Admitting: Internal Medicine

## 2015-08-08 DIAGNOSIS — M47814 Spondylosis without myelopathy or radiculopathy, thoracic region: Secondary | ICD-10-CM | POA: Diagnosis not present

## 2015-08-08 DIAGNOSIS — M150 Primary generalized (osteo)arthritis: Secondary | ICD-10-CM | POA: Diagnosis not present

## 2015-08-08 DIAGNOSIS — G894 Chronic pain syndrome: Secondary | ICD-10-CM | POA: Diagnosis not present

## 2015-08-08 DIAGNOSIS — Z79891 Long term (current) use of opiate analgesic: Secondary | ICD-10-CM | POA: Diagnosis not present

## 2015-08-15 DIAGNOSIS — S0101XA Laceration without foreign body of scalp, initial encounter: Secondary | ICD-10-CM | POA: Diagnosis not present

## 2015-08-15 DIAGNOSIS — R55 Syncope and collapse: Secondary | ICD-10-CM | POA: Diagnosis not present

## 2015-08-15 DIAGNOSIS — S42002A Fracture of unspecified part of left clavicle, initial encounter for closed fracture: Secondary | ICD-10-CM | POA: Diagnosis not present

## 2015-08-15 DIAGNOSIS — M25519 Pain in unspecified shoulder: Secondary | ICD-10-CM | POA: Diagnosis not present

## 2015-08-17 ENCOUNTER — Other Ambulatory Visit: Payer: Self-pay | Admitting: Orthopedic Surgery

## 2015-08-17 DIAGNOSIS — S2232XA Fracture of one rib, left side, initial encounter for closed fracture: Secondary | ICD-10-CM

## 2015-08-17 DIAGNOSIS — T148XXA Other injury of unspecified body region, initial encounter: Secondary | ICD-10-CM

## 2015-08-17 DIAGNOSIS — M25512 Pain in left shoulder: Secondary | ICD-10-CM | POA: Diagnosis not present

## 2015-08-18 ENCOUNTER — Ambulatory Visit
Admission: RE | Admit: 2015-08-18 | Discharge: 2015-08-18 | Disposition: A | Discharge status: Home / Self Care | Payer: Medicare Other | Source: Ambulatory Visit | Attending: Orthopedic Surgery | Admitting: Orthopedic Surgery

## 2015-08-18 DIAGNOSIS — R918 Other nonspecific abnormal finding of lung field: Secondary | ICD-10-CM | POA: Diagnosis not present

## 2015-08-18 DIAGNOSIS — T148XXA Other injury of unspecified body region, initial encounter: Secondary | ICD-10-CM

## 2015-08-18 DIAGNOSIS — S0990XA Unspecified injury of head, initial encounter: Secondary | ICD-10-CM | POA: Diagnosis not present

## 2015-08-18 DIAGNOSIS — R220 Localized swelling, mass and lump, head: Secondary | ICD-10-CM | POA: Diagnosis not present

## 2015-08-18 DIAGNOSIS — S2232XA Fracture of one rib, left side, initial encounter for closed fracture: Secondary | ICD-10-CM

## 2015-08-21 ENCOUNTER — Other Ambulatory Visit: Payer: Self-pay | Admitting: Internal Medicine

## 2015-08-22 ENCOUNTER — Other Ambulatory Visit: Payer: Self-pay | Admitting: Orthopedic Surgery

## 2015-08-22 DIAGNOSIS — Q248 Other specified congenital malformations of heart: Secondary | ICD-10-CM

## 2015-08-23 ENCOUNTER — Other Ambulatory Visit: Payer: Self-pay | Admitting: Orthopedic Surgery

## 2015-08-23 DIAGNOSIS — S12100A Unspecified displaced fracture of second cervical vertebra, initial encounter for closed fracture: Secondary | ICD-10-CM

## 2015-08-24 ENCOUNTER — Other Ambulatory Visit: Payer: Self-pay

## 2015-08-25 ENCOUNTER — Ambulatory Visit
Admission: RE | Admit: 2015-08-25 | Discharge: 2015-08-25 | Disposition: A | Discharge status: Home / Self Care | Payer: Medicare Other | Source: Ambulatory Visit | Attending: Orthopedic Surgery | Admitting: Orthopedic Surgery

## 2015-08-25 DIAGNOSIS — S12030D Displaced posterior arch fracture of first cervical vertebra, subsequent encounter for fracture with routine healing: Secondary | ICD-10-CM | POA: Diagnosis not present

## 2015-08-25 DIAGNOSIS — S12100A Unspecified displaced fracture of second cervical vertebra, initial encounter for closed fracture: Secondary | ICD-10-CM

## 2015-08-25 DIAGNOSIS — S12111D Posterior displaced Type II dens fracture, subsequent encounter for fracture with routine healing: Secondary | ICD-10-CM | POA: Diagnosis not present

## 2015-08-30 ENCOUNTER — Telehealth: Payer: Self-pay | Admitting: Cardiovascular Disease

## 2015-08-30 NOTE — Telephone Encounter (Signed)
New message      The office needs the most recent blood work [facility] faxed [phone]

## 2015-08-30 NOTE — Telephone Encounter (Signed)
Tisa at [facility] office requesting most recent lab work-results faxed to [phone].

## 2015-09-01 ENCOUNTER — Telehealth: Payer: Self-pay | Admitting: Internal Medicine

## 2015-09-01 ENCOUNTER — Other Ambulatory Visit: Payer: Self-pay | Admitting: *Deleted

## 2015-09-01 MED ORDER — LORAZEPAM 1 MG PO TABS: ORAL_TABLET | ORAL | Status: DC

## 2015-09-01 NOTE — Telephone Encounter (Signed)
Rx paper request faxed back with CY's signature faxed back to [facility] at [phone]. Nothing more needed at this time.

## 2015-09-01 NOTE — Telephone Encounter (Signed)
Created in Error

## 2015-09-14 ENCOUNTER — Telehealth: Payer: Self-pay | Admitting: Emergency Medicine

## 2015-09-14 NOTE — Telephone Encounter (Signed)
Faxed to pharmacy

## 2015-09-14 NOTE — Telephone Encounter (Signed)
[facility] needs pts lab results faxed over. Fax # is [phone]. Please follow up thanks.

## 2015-09-23 DIAGNOSIS — M5030 Other cervical disc degeneration, unspecified cervical region: Secondary | ICD-10-CM | POA: Diagnosis not present

## 2015-09-23 DIAGNOSIS — M810 Age-related osteoporosis without current pathological fracture: Secondary | ICD-10-CM | POA: Diagnosis not present

## 2015-10-11 ENCOUNTER — Other Ambulatory Visit: Payer: Self-pay | Admitting: Internal Medicine

## 2015-10-12 ENCOUNTER — Telehealth: Payer: Self-pay | Admitting: Internal Medicine

## 2015-10-12 DIAGNOSIS — J309 Allergic rhinitis, unspecified: Secondary | ICD-10-CM | POA: Diagnosis not present

## 2015-10-12 NOTE — Telephone Encounter (Signed)
Allergy Serum Extract Date Mixed: 10/12/15 Vial: 2 Strength: 1:50 Here/Mail/Pick Up: mail Mixed By: tbs Last OV: 06/02/15 Pending OV: 11/07/15

## 2015-10-31 DIAGNOSIS — Z9181 History of falling: Secondary | ICD-10-CM | POA: Diagnosis not present

## 2015-10-31 DIAGNOSIS — M542 Cervicalgia: Secondary | ICD-10-CM | POA: Diagnosis not present

## 2015-10-31 DIAGNOSIS — R51 Headache: Secondary | ICD-10-CM | POA: Diagnosis not present

## 2015-10-31 DIAGNOSIS — M546 Pain in thoracic spine: Secondary | ICD-10-CM | POA: Diagnosis not present

## 2015-10-31 DIAGNOSIS — Z981 Arthrodesis status: Secondary | ICD-10-CM | POA: Diagnosis not present

## 2015-10-31 DIAGNOSIS — M5440 Lumbago with sciatica, unspecified side: Secondary | ICD-10-CM | POA: Diagnosis not present

## 2015-11-07 ENCOUNTER — Encounter: Payer: Self-pay | Admitting: Internal Medicine

## 2015-11-07 ENCOUNTER — Ambulatory Visit (INDEPENDENT_AMBULATORY_CARE_PROVIDER_SITE_OTHER): Payer: Medicare Other | Admitting: Internal Medicine

## 2015-11-07 DIAGNOSIS — Z23 Encounter for immunization: Secondary | ICD-10-CM

## 2015-11-07 DIAGNOSIS — G4700 Insomnia, unspecified: Secondary | ICD-10-CM

## 2015-11-07 DIAGNOSIS — L500 Allergic urticaria: Secondary | ICD-10-CM

## 2015-11-07 DIAGNOSIS — I6523 Occlusion and stenosis of bilateral carotid arteries: Secondary | ICD-10-CM | POA: Diagnosis not present

## 2015-11-07 NOTE — Assessment & Plan Note (Signed)
We discussed her long-term use of Ambien and of diphenhydramine especially as she gets older. She denies sleepwalking or nocturnal confusion. I asked her to start weaning off of these medicines as able.

## 2015-11-07 NOTE — Progress Notes (Signed)
Subjective:    Patient ID: Hailey [name], female    DOB: Oct 25, 1940, 75 y.o.   MRN: [id number]  HPI F former smoker followed for allergic rhinitis, insomnia, complicated by DGD/ surgeries/ chronic pain, GERD      01/16/13-  72 yoF former smoker followed for allergic rhinitis, insomnia, complicated by DGD/ surgeries/ chronic pain, GERD  FOLLOWS FOR: uses Lunesta to help at times; still on allergy vaccine 1:50 GO, having sniffles. She feels allergy vaccine does well for her. We discussed EpiPen refill. She again reminds me she has had a total of 8 back surgeries. Physical discomfort contributes to chronic insomnia. Lunesta usually works.  06/02/2015-75 year old female former smoker followed for allergic rhinitis, insomnia complicated by DGD/surgeries/chronic pain, GERD Allergy Vaccine 1:50 GO FOLLOWS FOR: Recently had hives that are now clearing up-? Prolia causing the hives(will speak with MD that gives Rx). Pt is still on allergy vaccine and no reactions.  Husband died of emphysema 10 years ago but she insists he always smoked in the garage Prior CXR had ?'d emphysema  New onset hives began 10 weeks ago at time of most recent Prolia injection. Her neurosurgeon told her to do anything she could to continue Prolia, after her 10th major surgery, mostly on her spine. She gets Prolia injection every 6 months. Urticaria continued for about 3 months after her last shot. Taking Benadryl and Zz Quill and Ambien 5 mg for sleep. Does well with this Office Spirometry 06/02/2015-normal-FVC 2.78/84%, FEV1 2.16/88%, FEV1/FVC 0.78, FEF 25-75 percent 1.97/101%.  11/07/2015-75 year old female former smoker followed for allergic rhinitis, Urticaria, insomnia, complicated by DG DD/surgeries/chronic pain, GERD Allergy vaccine 1:50 GO FOLLOWS FOR: Still on vaccine and doing well Urticaria of concern at last visit has not returned despite receiving another Prolia injection We discussed anticipated closure of our  allergy clinic. She thinks she would "get sick" if she came off of her allergy shots. I suggested she try conservative therapy but I think she is going to be happiest transferring to another allergy office. Insomnia now adequately controlled with Zzquil/ Ambien 5 mg.  ROS-see HPI Constitutional:   No-   weight loss, night sweats, fevers, chills, fatigue, lassitude. HEENT:   No-  headaches, difficulty swallowing, tooth/dental problems, sore throat,       No-  sneezing, itching, ear ache, nasal congestion, post nasal drip,  CV:  No-   chest pain, orthopnea, PND, swelling in lower extremities, anasarca, dizziness, palpitations Resp: No-   shortness of breath with exertion or at rest.              No-   productive cough,  No non-productive cough,  No- coughing up of blood.              No-   change in color of mucus.  No- wheezing.   Skin: No-   rash or lesions. GI:  No-   heartburn, indigestion, abdominal pain, nausea, vomiting, GU:  MS:  + joint pain or swelling.  .  + back pain. Neuro-     nothing unusual Psych:  No- change in mood or affect. No depression or anxiety.  No memory loss.  Objective:   Physical Exam General- Alert, Oriented, Affect-appropriate/ calm, Distress- none acute   Slender  Skin- rash-none, lesions- none, excoriation- none Lymphadenopathy- none Head- atraumatic            Eyes- Gross vision intact, PERRLA, conjunctivae clear secretions  Ears- Hearing, canals            Nose- Clear, No- Septal dev, mucus, polyps, erosion, perforation             Throat- Mallampati II , mucosa clear , drainage- none, tonsils- atrophic Neck- flexible , trachea midline, no stridor , thyroid nl, carotid no bruit Chest - symmetrical excursion , unlabored           Heart/CV- RRR , no murmur , no gallop  , no rub, nl s1 s2                           - JVD- none , edema- none, stasis changes- none, varices- none           Lung- clear, wheeze- none, cough- none , dullness-none,  rub- none           Chest wall-  Abd-  Br/ Gen/ Rectal- Not done, not indicated Extrem- cyanosis- none, clubbing, none, atrophy- none, strength- nl Neuro- grossly intact to observation  Assessment & Plan:

## 2015-11-07 NOTE — Patient Instructions (Addendum)
We can continue allergy shots here until the allergy clinic closes this winter-probably late December.  We discussed having you call to establish with another allergy practice especially since you feel you will probably want to continue allergy shots.  You can make that transfer at any time.  You will want to use the least sleep medicine you can get by with.  Flu shot

## 2015-11-07 NOTE — Assessment & Plan Note (Signed)
Idiopathic urticaria. She thought last year was related to Prolia injection but hives resolved spontaneously and did not recur with a repeat of the injection this year.

## 2015-11-10 DIAGNOSIS — G894 Chronic pain syndrome: Secondary | ICD-10-CM | POA: Diagnosis not present

## 2015-11-10 DIAGNOSIS — M150 Primary generalized (osteo)arthritis: Secondary | ICD-10-CM | POA: Diagnosis not present

## 2015-11-10 DIAGNOSIS — Z79891 Long term (current) use of opiate analgesic: Secondary | ICD-10-CM | POA: Diagnosis not present

## 2015-11-10 DIAGNOSIS — M47814 Spondylosis without myelopathy or radiculopathy, thoracic region: Secondary | ICD-10-CM | POA: Diagnosis not present

## 2015-11-24 ENCOUNTER — Ambulatory Visit (INDEPENDENT_AMBULATORY_CARE_PROVIDER_SITE_OTHER): Payer: Medicare Other | Admitting: Cardiovascular Disease

## 2015-11-24 ENCOUNTER — Encounter: Payer: Self-pay | Admitting: Cardiovascular Disease

## 2015-11-24 VITALS — BP 113/77 | HR 71 | Ht 67.0 in | Wt 131.4 lb

## 2015-11-24 DIAGNOSIS — I6523 Occlusion and stenosis of bilateral carotid arteries: Secondary | ICD-10-CM

## 2015-11-24 DIAGNOSIS — R0602 Shortness of breath: Secondary | ICD-10-CM

## 2015-11-24 DIAGNOSIS — E7800 Pure hypercholesterolemia, unspecified: Secondary | ICD-10-CM | POA: Diagnosis not present

## 2015-11-24 NOTE — Progress Notes (Signed)
Cardiology Office Note   Date:  11/24/2015   ID:  Hailey [name] Hailey [name], MRN [id number]  PCP:  Hailey Koch, MD  Cardiologist:   Hailey Latch, MD   Chief Complaint  [name] presents with  . Follow-up    pt stop all her blood pressure medications, pt stated she faint 2 times in one day, and she believe that it's Hailey cause of her blood pressure medications      [name] ID:  Hailey [name] is a 75 y.o. female with hypertension, hyperlipidemia, subarachnoid hemorrhage, and alcohol abuse who presents for follow-up.  Hailey [name] was first seen 11/2014 with exertional dyspnea.   She had an echo 12/23/14 that showed grade 1 diastolic dysfunction and mild dilation of Hailey ascending aorta. Sysotic function was normal and there were no wall motion abnormalities. She also had carotid Dopplers that showed 1-39% and assist bilaterally.  Since her Last visit Hailey [name] had an episode of syncope on 08/2015. Hailey episode occurred upon standing up and walking to Hailey bathroom. She was able to get back in her bed and had a recurrent event a few hours later.  She decided to stop her blood pressure medications and has not had any recurrent symptoms. She checks her blood pressure regularly and has been in Hailey 110s over 70s. Prior to her syncopal event she had lost 20 pounds by dieting. She felt that she was at an unhealthy weight given Hailey fact that she has shrunken several inches.  She denies lower extremity edema, orthopnea or PND.      Past Medical History:  Diagnosis Date  . Allergic rhinitis, cause unspecified   . Carotid artery disease ([place]) 12/13/2014   1-39% stenosis of bilateral CCAs.   Repeat ultrasound 11/2016.  Marland Kitchen Cervical spine fracture ([place]) 02/01/2014  . Chronic insomnia   . Chronic interstitial cystitis   . Closed fracture of rib(s), unspecified   . Disorder of bone and cartilage, unspecified   . Esophageal reflux   . 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 OSA (obstructive sleep apnea)   . Osteoarthrosis, unspecified whether generalized or localized, unspecified site   . Other and unspecified hyperlipidemia   . Personal history of colonic adenomas 11/28/2012  . Personal history of other disorders of nervous system and sense organs   . SAH (subarachnoid hemorrhage) ([place]) 02/02/2014  . Unspecified closed fracture of pelvis ([place])    x 2 2004/2006  . Unspecified essential hypertension     Past Surgical History:  Procedure Laterality Date  . ANKLE FUSION Left   . BACK SURGERY     ; has  had 8 back surgeries  . CERVICAL FUSION     C4-7  . COLONOSCOPY    . CYSTOSCOPY    . HERNIA REPAIR     umbilical  . LUMBAR FUSION     L2-3  . SACRAL FUSION  07/2012  . TIBIA FRACTURE SURGERY Right   . TONSILLECTOMY    . TOTAL HIP ARTHROPLASTY Right 2012  . TUBAL LIGATION    . UPPER GASTROINTESTINAL ENDOSCOPY       Current Outpatient Prescriptions  Medication Sig Dispense Refill  . atorvastatin (LIPITOR) 40 MG tablet TAKE 1 TABLET ONCE DAILY. 90 tablet 0  . Calcium Carbonate-Vitamin D (CALCIUM + D PO) Take by mouth daily.    . celecoxib (CELEBREX) 200 MG capsule TAKE 1 CAP ONCE DAILY AS NEEDED--AVOID DAILY USE DUE TO  GI AND CARDIAC POTENTIAL ADVERSE EFFECTS 30 capsule 5  . cholecalciferol (VITAMIN D) 1000 UNITS tablet Take 1,000 Units by mouth daily.    Marland Kitchen denosumab (PROLIA) 60 MG/ML SOLN injection Inject 60 mg into Hailey skin every 6 (six) months. Administer in upper arm, thigh, or abdomen    . EPINEPHrine (EPIPEN 2-PAK) 0.3 mg/0.3 mL IJ SOAJ injection Inject 0.3 mg into Hailey muscle daily as needed. Allergic reaction    . escitalopram (LEXAPRO) 20 MG tablet TAKE 1 TABLET ONCE DAILY. 90 tablet 0  . ferrous sulfate 325 (65 FE) MG tablet Take 325 mg by mouth daily with breakfast.      . fexofenadine (ALLEGRA) 180 MG tablet Take 180 mg by mouth daily.      Marland Kitchen LORazepam (ATIVAN) 1 MG tablet TAKE 1-1&1/2 TABS AT BEDTIME IF NEEDED FOR SLEEP.  60 tablet 5  . methocarbamol (ROBAXIN) 500 MG tablet Take 1 tablet by mouth daily as needed.    . Misc Natural Products (OSTEO BI-FLEX TRIPLE STRENGTH PO) Take 1 tablet by mouth 2 (two) times daily.     . Multiple Vitamin (MULTIVITAMIN PO) Take 1 tablet by mouth daily.     . NONFORMULARY OR COMPOUNDED ITEM Allergy Vaccine 1:50 Given at Home    . ondansetron (ZOFRAN ODT) 4 MG disintegrating tablet Take 1 tablet (4 mg total) by mouth every 8 (eight) hours as needed for nausea or vomiting. 15 tablet 0  . Probiotic Product (ALIGN) 4 MG CAPS Take 4 mg by mouth daily.     . ranitidine (ZANTAC) 150 MG tablet Take 1 tablet (150 mg total) by mouth 2 (two) times daily. 180 tablet 1  . spironolactone (ALDACTONE) 25 MG tablet TAKE 1 TABLET ONCE DAILY. 90 tablet 0  . Teriparatide, Recombinant, (FORTEO [place]) Inject into Hailey skin every 6 (six) months.     . traMADol-acetaminophen (ULTRACET) 37.5-325 MG tablet Take 1 tablet by mouth 3 (three) times daily.    Marland Kitchen zolpidem (AMBIEN) 5 MG tablet TAKE 1 TABLET AT BEDTIME AS NEEDED. 30 tablet 5   No current facility-administered medications for this visit.     Allergies:   Resorcinol and Other    Social History:  Hailey [name]  reports that she quit smoking about 48 years ago. Her smoking use included Cigarettes. She has a 20.00 pack-year smoking history. She has never used smokeless tobacco. She reports that she drinks about 2.4 oz of alcohol per week . She reports that she does not use drugs.   Family History:  Hailey [name]'s family history includes Alzheimer's disease in her maternal aunt; Cancer in her cousin and maternal aunt; Dementia in her mother; Heart attack in her father; Hypertension in her father; Stroke in her paternal uncle.    ROS:  Please see Hailey history of present illness.   Otherwise, review of systems are positive for none.   All other systems are reviewed and negative.    PHYSICAL EXAM: VS:  BP 113/77   Pulse 71   Ht 5\' 7"  (1.702 m)   Wt 131  lb 6.4 oz (59.6 kg)   BMI 20.58 kg/m  , BMI Body mass index is 20.58 kg/m. GENERAL:  Well appearing HEENT:  Pupils equal round and reactive, fundi not visualized, oral mucosa unremarkable NECK:  No jugular venous distention, waveform within normal limits, carotid upstroke brisk and symmetric, no bruits LYMPHATICS:  No cervical adenopathy LUNGS:  Clear to auscultation bilaterally HEART:  RRR.  PMI not displaced or sustained,S1 and  S2 within normal limits, no S3, no S4, no clicks, no rubs, no murmurs ABD:  Flat, positive bowel sounds normal in frequency in pitch, no bruits, no rebound, no guarding, no midline pulsatile mass, no hepatomegaly, no splenomegaly EXT:  2 plus pulses throughout, no edema, no cyanosis no clubbing SKIN:  No rashes no nodules NEURO:  Cranial nerves II through XII grossly intact, motor grossly intact throughout PSYCH:  Cognitively intact, oriented to person place and time   EKG:  EKG is not ordered today. 11/24/15: Sinus rhythm rate 71 bpm.  Low voltage.   11/26/14:sinus rhythm at 83 BMP. Low voltage limb leads.  Prior inferior infarct.  L axis deviation.    Echo 12/23/14: Study Conclusions  - Left ventricle: Hailey cavity size was normal. Wall thickness was  normal. Systolic function was normal. Hailey estimated ejection  fraction was in Hailey range of 55% to 60%. Wall motion was normal;  there were no regional wall motion abnormalities. Doppler  parameters are consistent with abnormal left ventricular  relaxation (grade 1 diastolic dysfunction). - Aortic valve: Trileaflet; mildly thickened, mildly calcified  leaflets. - Aorta: Hailey aorta was mildly dilated. Ascending aortic diameter:  38 mm (S). - Mitral valve: Calcified annulus.  Lexiscan Cardiolite 12/07/14:  Nuclear stress EF: 67%.  Hailey left ventricular ejection fraction is hyperdynamic (>65%).  There was no ST segment deviation noted during stress.  This is a low risk study.  Technically  suboptimal due to subdiaphragmatic activity; low risk stress nuclear study with a medium size, severe intensity, fixed inferior apical defect suggestive of prior infarct; no ischemia; EF 67 with normal wall motion.  Carotid Doppler 12/07/14:   1-39% stenosis of bilateral common carotid arteries.  recommended follow-up 11/2016.  Recent Labs: 06/14/2015: ALT 11; BUN 13; Creatinine, Ser 0.86; Hemoglobin 12.4; Platelets 219.0; Potassium 4.4; Sodium 133    Lipid Panel    Component Value Date/Time   CHOL 166 06/14/2015 1640   TRIG 102.0 06/14/2015 1640   TRIG 40 12/25/2005 1439   HDL 42.90 06/14/2015 1640   CHOLHDL 4 06/14/2015 1640   VLDL 20.4 06/14/2015 1640   LDLCALC 102 (H) 06/14/2015 1640   LDLDIRECT 170.9 03/20/2010 1356      Wt Readings from Last 3 Encounters:  11/24/15 131 lb 6.4 oz (59.6 kg)  11/07/15 131 lb 6.4 oz (59.6 kg)  06/14/15 138 lb (62.6 kg)      ASSESSMENT AND PLAN:  # Exertional dyspnea: Symptoms have improved significantly.  She had a low risk Cardiolite and echo showed grade 1 diastolic dysfunction.    # Carotid artery disease: Carotid duplex showed mild disease bilaterally.  Repeat 11/2016.  Continue atorvastatin.  Consider aspirin.   # Palpitations: She remains asymptomatic off verapamil.   # Hyperlipidemia:  Continue atorvastatin.  LDL was 102 06/2015.  Suspect it is even lower given her 20lb weight loss.  We will repeat lipids at her follow-up appointment.  # Hypertension:  Blood pressure is well-controlled off all antihypertensives. She had an episode of syncope and is feeling much better since stopping her blood pressure medications.  Current medicines are reviewed at length with Hailey [name] today.  Hailey [name] does not have concerns regarding medicines.  Hailey following changes have been made:  no change  Labs/ tests ordered today include:   No orders of Hailey defined types were placed in this encounter.    Disposition:   FU with Ferdinando Lodge C.  Oval Linsey, MD in 1 year.   Signed, Harriette Tovey  Oval Linsey, MD  11/24/2015 2:59 PM    [place]

## 2015-11-24 NOTE — Patient Instructions (Signed)

## 2015-12-15 ENCOUNTER — Ambulatory Visit: Payer: Self-pay | Admitting: Internal Medicine

## 2015-12-23 ENCOUNTER — Ambulatory Visit (INDEPENDENT_AMBULATORY_CARE_PROVIDER_SITE_OTHER): Payer: Medicare Other | Admitting: Internal Medicine

## 2015-12-23 ENCOUNTER — Encounter: Payer: Self-pay | Admitting: Internal Medicine

## 2015-12-23 DIAGNOSIS — I10 Essential (primary) hypertension: Secondary | ICD-10-CM | POA: Diagnosis not present

## 2015-12-23 DIAGNOSIS — G894 Chronic pain syndrome: Secondary | ICD-10-CM

## 2015-12-23 DIAGNOSIS — I6523 Occlusion and stenosis of bilateral carotid arteries: Secondary | ICD-10-CM | POA: Diagnosis not present

## 2015-12-23 NOTE — Patient Instructions (Signed)
We will take over the prescriptions for the tramadol and the lorazepam and the methocarbamol.

## 2015-12-23 NOTE — Progress Notes (Signed)
   Subjective:    Patient ID: Hailey [name], female    DOB: August 18, 1940, 75 y.o.   MRN: [id number]  HPI The patient is a 75 YO female coming in for concern about her medications. She has had some adverse effects recently from specialists not talking to each other and prescribing things she should not have together. She has talked to her pharmacist and this is how she found out she was on several things she should not have been. Her weight has been going down (she is trying to lose weight) and her blood pressure dropped dangerously. She stopped taking old blood pressure medications (her previous doctor was prescribing) and then felt better. Her cardiologist kept her off these meds.   Review of Systems  Constitutional: Negative for activity change, appetite change, fatigue and fever.  Respiratory: Negative for cough, chest tightness and shortness of breath.   Cardiovascular: Negative for chest pain, palpitations and leg swelling.  Gastrointestinal: Negative for abdominal distention, abdominal pain, constipation, diarrhea, nausea and vomiting.  Musculoskeletal: Positive for arthralgias and back pain. Negative for gait problem, myalgias, neck pain and neck stiffness.  Skin: Negative.   Neurological: Negative.       Objective:   Physical Exam  Constitutional: She is oriented to person, place, and time. She appears well-developed and well-nourished.  HENT:  Head: Normocephalic and atraumatic.  Eyes: EOM are normal.  Neck: Normal range of motion.  Cardiovascular: Normal rate and regular rhythm.   Pulmonary/Chest: Effort normal. No respiratory distress. She has no wheezes. She has no rales.  Abdominal: Soft. She exhibits no distension. There is no tenderness. There is no rebound.  Musculoskeletal: She exhibits tenderness.  Neurological: She is alert and oriented to person, place, and time. Coordination normal.  Skin: Skin is warm and dry.   Vitals:   12/23/15 1543  BP: 112/60  Pulse: 82    Resp: 14  Temp: 97.9 F (36.6 C)  TempSrc: Oral  SpO2: 96%  Weight: 130 lb (59 kg)  Height: 5\' 6"  (1.676 m)      Assessment & Plan:

## 2015-12-23 NOTE — Progress Notes (Signed)
Pre visit review using our clinic review tool, if applicable. No additional management support is needed unless otherwise documented below in the visit note. 

## 2015-12-24 DIAGNOSIS — G894 Chronic pain syndrome: Secondary | ICD-10-CM | POA: Insufficient documentation

## 2015-12-24 NOTE — Assessment & Plan Note (Signed)
Does currently go to pain management and gets 200 tramadol per month from them. Admits to taking 4 typically and sometimes more with extra pain. Agree to take over this medication and her robaxin to ensure less drug drug effects.

## 2015-12-24 NOTE — Assessment & Plan Note (Signed)
Her blood pressure is running low still off her meds without symptoms. She is advised to continue to be off those medications.

## 2015-12-29 DIAGNOSIS — M47814 Spondylosis without myelopathy or radiculopathy, thoracic region: Secondary | ICD-10-CM | POA: Diagnosis not present

## 2015-12-29 DIAGNOSIS — M150 Primary generalized (osteo)arthritis: Secondary | ICD-10-CM | POA: Diagnosis not present

## 2015-12-29 DIAGNOSIS — G894 Chronic pain syndrome: Secondary | ICD-10-CM | POA: Diagnosis not present

## 2015-12-29 DIAGNOSIS — Z79891 Long term (current) use of opiate analgesic: Secondary | ICD-10-CM | POA: Diagnosis not present

## 2016-01-19 ENCOUNTER — Other Ambulatory Visit: Payer: Self-pay | Admitting: Internal Medicine

## 2016-01-20 ENCOUNTER — Other Ambulatory Visit: Payer: Self-pay

## 2016-01-25 NOTE — Progress Notes (Signed)
Subjective:   Hailey [name] is a 75 y.o. female who presents for Medicare Annual (Subsequent) preventive examination.  The Patient was informed that the wellness visit is to identify future health risk and educate and initiate measures that can reduce risk for increased disease through the lifespan.   Describes health as fair, good or great? "great"  Review of Systems:  No ROS.  Medicare Wellness Visit.  Cardiac Risk Factors include: advanced age (>89men, >59 women);dyslipidemia;hypertension Sleep patterns:  Sleeps at least 8 hours.  Home Safety/Smoke Alarms:  Furniture conservator/restorer and security in place.  Living environment; residence and Firearm Safety: Lives alone in single story home (1 cat). No firearms. Wears safety alert bracelet.  Seat Belt Safety/Bike Helmet: Wears seat belt.    Counseling:   Eye Exam-Last exam 2016, followed prn.  Dental-Last exam 10/2015, followed every 4 months by [facility].  Female:   Pap-Pt reports last 2017, normal. No plans for f/u.      Mammo-10/29/2014, negative. Patient will call to make appt.  Dexa scan-10/29/2014, Osteopenia. Prolia injections.      CCS-colonoscopy 11/24/2012, adenomatous polyps & hemorrhoids. Recall 5 years.      Objective:     Vitals: BP 110/76 (BP Location: Right Arm, Patient Position: Sitting, Cuff Size: Normal)   Pulse 81   Ht 5\' 6"  (1.676 m)   Wt 132 lb 1.3 oz (59.9 kg)   SpO2 97%   BMI 21.32 kg/m   Body mass index is 21.32 kg/m.   Tobacco History  Smoking Status  . Former Smoker  . Packs/day: 2.00  . Years: 10.00  . Types: Cigarettes  . Quit date: 02/13/1967  Smokeless Tobacco  . Never Used    Comment: smoked 1961-1969 , up to 3 ppd, mainly 2 ppd     Counseling given: No   Past Medical History:  Diagnosis Date  . Allergic rhinitis, cause unspecified   . Carotid artery disease ([place]) 12/13/2014   1-39% stenosis of bilateral CCAs.   Repeat ultrasound 11/2016.  Marland Kitchen Cervical spine fracture ([place]) 02/01/2014  .  Chronic insomnia   . Chronic interstitial cystitis   . Closed fracture of rib(s), unspecified   . Disorder of bone and cartilage, unspecified   . Esophageal reflux   . 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 OSA (obstructive sleep apnea)   . Osteoarthrosis, unspecified whether generalized or localized, unspecified site   . Other and unspecified hyperlipidemia   . Personal history of colonic adenomas 11/28/2012  . Personal history of other disorders of nervous system and sense organs   . SAH (subarachnoid hemorrhage) ([place]) 02/02/2014  . Unspecified closed fracture of pelvis    x 2 2004/2006  . Unspecified essential hypertension    Past Surgical History:  Procedure Laterality Date  . ANKLE FUSION Left   . BACK SURGERY     ; has  had 8 back surgeries  . CERVICAL FUSION     C4-7  . COLONOSCOPY    . CYSTOSCOPY    . HERNIA REPAIR     umbilical  . LUMBAR FUSION     L2-3  . SACRAL FUSION  07/2012  . TIBIA FRACTURE SURGERY Right   . TONSILLECTOMY    . TOTAL HIP ARTHROPLASTY Right 2012  . TUBAL LIGATION    . UPPER GASTROINTESTINAL ENDOSCOPY     Family History  Problem Relation Age of Onset  . Dementia Mother   . Hypertension Father   .  Heart attack Father   . Cancer Maternal Aunt     breast cancer  . Alzheimer's disease Maternal Aunt   . Stroke Paternal Uncle   . Cancer Cousin     breast cancer  . Colon cancer Neg Hx    History  Sexual Activity  . Sexual activity: Not on file    Outpatient Encounter Prescriptions as of 01/26/2016  Medication Sig  . atorvastatin (LIPITOR) 40 MG tablet TAKE 1 TABLET ONCE DAILY.  . Calcium Carbonate-Vitamin D (CALCIUM + D PO) Take by mouth daily.  . celecoxib (CELEBREX) 200 MG capsule TAKE 1 CAP ONCE DAILY AS NEEDED--AVOID DAILY USE DUE TO GI AND CARDIAC POTENTIAL ADVERSE EFFECTS  . cholecalciferol (VITAMIN D) 1000 UNITS tablet Take 1,000 Units by mouth daily.  Marland Kitchen denosumab (PROLIA) 60 MG/ML SOLN injection  Inject 60 mg into the skin every 6 (six) months. Administer in upper arm, thigh, or abdomen  . EPINEPHrine (EPIPEN 2-PAK) 0.3 mg/0.3 mL IJ SOAJ injection Inject 0.3 mg into the muscle daily as needed. Allergic reaction  . escitalopram (LEXAPRO) 20 MG tablet TAKE 1 TABLET ONCE DAILY.  . ferrous sulfate 325 (65 FE) MG tablet Take 325 mg by mouth daily with breakfast.    . fexofenadine (ALLEGRA) 180 MG tablet Take 180 mg by mouth daily.    Marland Kitchen LORazepam (ATIVAN) 1 MG tablet TAKE 1-1&1/2 TABS AT BEDTIME IF NEEDED FOR SLEEP.  Marland Kitchen methocarbamol (ROBAXIN) 500 MG tablet Take 1 tablet by mouth daily as needed.  . Misc Natural Products (OSTEO BI-FLEX TRIPLE STRENGTH PO) Take 1 tablet by mouth 2 (two) times daily.   . Multiple Vitamin (MULTIVITAMIN PO) Take 1 tablet by mouth daily.   . NONFORMULARY OR COMPOUNDED ITEM Allergy Vaccine 1:50 Given at Home  . ondansetron (ZOFRAN ODT) 4 MG disintegrating tablet Take 1 tablet (4 mg total) by mouth every 8 (eight) hours as needed for nausea or vomiting.  . Probiotic Product (ALIGN) 4 MG CAPS Take 4 mg by mouth daily.   . ranitidine (ZANTAC) 150 MG tablet Take 1 tablet (150 mg total) by mouth 2 (two) times daily.  Marland Kitchen zolpidem (AMBIEN) 5 MG tablet TAKE 1 TABLET AT BEDTIME AS NEEDED.  . Teriparatide, Recombinant, (FORTEO [place]) Inject into the skin every 6 (six) months.   . traMADol-acetaminophen (ULTRACET) 37.5-325 MG tablet Take 1 tablet by mouth 3 (three) times daily.  . [DISCONTINUED] escitalopram (LEXAPRO) 20 MG tablet TAKE 1 TABLET ONCE DAILY.   No facility-administered encounter medications on file as of 01/26/2016.     Activities of Daily Living In your present state of health, do you have any difficulty performing the following activities: 01/26/2016  Hearing? N  Vision? N  Difficulty concentrating or making decisions? N  Walking or climbing stairs? N  Dressing or bathing? N  Doing errands, shopping? N  Preparing Food and eating ? N  Using the Toilet? N    In the past six months, have you accidently leaked urine? N  Do you have problems with loss of bowel control? N  Managing your Medications? N  Managing your Finances? N  Housekeeping or managing your Housekeeping? N  Some recent data might be hidden    Patient Care Team: Hoyt Koch, MD as PCP - General (Internal Medicine) Deneise Lever, MD as Consulting Physician (Pulmonary Disease) Jiles Prows, MD as Consulting Physician (Allergy and Immunology) Skeet Latch, MD as Attending Physician (Cardiology) Jenne Campus, MD as Referring Physician (Neurosurgery) Iven Finn, DMD (Dentistry)  Assessment:    Physical assessment deferred to PCP.  Exercise Activities and Dietary recommendations Current Exercise Habits: The patient does not participate in regular exercise at present, Exercise limited by: orthopedic condition(s) (back)   Diet (meal preparation, eat out, water intake, caffeinated beverages, dairy products, fruits and vegetables): Drinks water.   Breakfast: blueberry waffles, fruit Lunch: skips (late breakfast) Dinner: lean protein, vegetables, salad.      Encouraged to continue healthy food choices.  Discussed increasing exercise as tolerated. Patient has indoor pool in home she plans to start using. Has not recently exercised d/t back issues.   Goals    . Exercise 3x per week (30 min per time)          Increase exercise in pool.      Fall Risk Fall Risk  01/26/2016 12/23/2015 12/16/2014 05/06/2013  Falls in the past year? Yes Yes Yes Yes  Number falls in past yr: 2 or more 2 or more 1 2 or more  Injury with Fall? No Yes Yes -  Risk for fall due to : Medication side effect - - -  Follow up Falls prevention discussed - - -   Depression Screen PHQ 2/9 Scores 01/26/2016 12/23/2015 12/16/2014 05/06/2013  PHQ - 2 Score 0 0 0 0     Cognitive Function       Ad8 score reviewed for issues:  Issues making decisions:no  Less interest in hobbies /  activities:no  Repeats questions, stories (family complaining):no  Trouble using ordinary gadgets (microwave, computer, phone):no  Forgets the month or year: no  Mismanaging finances: no  Remembering appts:no  Daily problems with thinking and/or memory:no Ad8 score is=0     Immunization History  Administered Date(s) Administered  . Influenza Split 10/29/2011  . Influenza Whole 11/26/2006, 11/14/2007, 11/12/2009  . Influenza, High Dose Seasonal PF 11/19/2012  . Influenza,inj,Quad PF,36+ Mos 11/26/2014, 12/16/2014, 11/07/2015  . Pneumococcal Conjugate-13 11/07/2014  . Pneumococcal Polysaccharide-23 01/18/2012  . Td 02/25/2004  . Tdap 12/16/2014  . Zoster 10/18/2005   Screening Tests Health Maintenance  Topic Date Due  . COLONOSCOPY  11/24/2017  . TETANUS/TDAP  12/15/2024  . INFLUENZA VACCINE  Addressed  . DEXA SCAN  Completed  . ZOSTAVAX  Completed  . PNA vac Low Risk Adult  Completed      Plan:     Continue to eat heart healthy diet (full of fruits, vegetables, whole grains, lean protein, water--limit salt, fat, and sugar intake) and increase physical activity as tolerated.  Continue doing brain stimulating activities (puzzles, reading, adult coloring books, staying active) to keep memory sharp.   Bring a copy of your advance directives to your next office visit.  Schedule mammogram.  During the course of the visit the patient was educated and counseled about the following appropriate screening and preventive services:   Vaccines to include Pneumoccal, Influenza, Hepatitis B, Td, Zostavax, HCV  Cardiovascular Disease  Colorectal cancer screening  Bone density screening  Diabetes screening  Glaucoma screening  Mammography/PAP  Nutrition counseling   Patient Instructions (the written plan) was given to the patient.   Gerilyn Nestle, RN  01/26/2016

## 2016-01-26 ENCOUNTER — Other Ambulatory Visit: Payer: Self-pay | Admitting: Internal Medicine

## 2016-01-26 ENCOUNTER — Ambulatory Visit (INDEPENDENT_AMBULATORY_CARE_PROVIDER_SITE_OTHER): Payer: Medicare Other

## 2016-01-26 VITALS — BP 110/76 | HR 81 | Ht 66.0 in | Wt 132.1 lb

## 2016-01-26 DIAGNOSIS — Z0000 Encounter for general adult medical examination without abnormal findings: Secondary | ICD-10-CM

## 2016-01-26 NOTE — Patient Instructions (Addendum)
Continue to eat heart healthy diet (full of fruits, vegetables, whole grains, lean protein, water--limit salt, fat, and sugar intake) and increase physical activity as tolerated.  Continue doing brain stimulating activities (puzzles, reading, adult coloring books, staying active) to keep memory sharp.   Bring a copy of your advance directives to your next office visit.  Schedule mammogram.   Fall Prevention in the Home Introduction Falls can cause injuries. They can happen to people of all ages. There are many things you can do to make your home safe and to help prevent falls. What can I do on the outside of my home?  Regularly fix the edges of walkways and driveways and fix any cracks.  Remove anything that might make you trip as you walk through a door, such as a raised step or threshold.  Trim any bushes or trees on the path to your home.  Use bright outdoor lighting.  Clear any walking paths of anything that might make someone trip, such as rocks or tools.  Regularly check to see if handrails are loose or broken. Make sure that both sides of any steps have handrails.  Any raised decks and porches should have guardrails on the edges.  Have any leaves, snow, or ice cleared regularly.  Use sand or salt on walking paths during winter.  Clean up any spills in your garage right away. This includes oil or grease spills. What can I do in the bathroom?  Use night lights.  Install grab bars by the toilet and in the tub and shower. Do not use towel bars as grab bars.  Use non-skid mats or decals in the tub or shower.  If you need to sit down in the shower, use a plastic, non-slip stool.  Keep the floor dry. Clean up any water that spills on the floor as soon as it happens.  Remove soap buildup in the tub or shower regularly.  Attach bath mats securely with double-sided non-slip rug tape.  Do not have throw rugs and other things on the floor that can make you trip. What can  I do in the bedroom?  Use night lights.  Make sure that you have a light by your bed that is easy to reach.  Do not use any sheets or blankets that are too big for your bed. They should not hang down onto the floor.  Have a firm chair that has side arms. You can use this for support while you get dressed.  Do not have throw rugs and other things on the floor that can make you trip. What can I do in the kitchen?  Clean up any spills right away.  Avoid walking on wet floors.  Keep items that you use a lot in easy-to-reach places.  If you need to reach something above you, use a strong step stool that has a grab bar.  Keep electrical cords out of the way.  Do not use floor polish or wax that makes floors slippery. If you must use wax, use non-skid floor wax.  Do not have throw rugs and other things on the floor that can make you trip. What can I do with my stairs?  Do not leave any items on the stairs.  Make sure that there are handrails on both sides of the stairs and use them. Fix handrails that are broken or loose. Make sure that handrails are as long as the stairways.  Check any carpeting to make sure that it is firmly  attached to the stairs. Fix any carpet that is loose or worn.  Avoid having throw rugs at the top or bottom of the stairs. If you do have throw rugs, attach them to the floor with carpet tape.  Make sure that you have a light switch at the top of the stairs and the bottom of the stairs. If you do not have them, ask someone to add them for you. What else can I do to help prevent falls?  Wear shoes that:  Do not have high heels.  Have rubber bottoms.  Are comfortable and fit you well.  Are closed at the toe. Do not wear sandals.  If you use a stepladder:  Make sure that it is fully opened. Do not climb a closed stepladder.  Make sure that both sides of the stepladder are locked into place.  Ask someone to hold it for you, if possible.  Clearly  mark and make sure that you can see:  Any grab bars or handrails.  First and last steps.  Where the edge of each step is.  Use tools that help you move around (mobility aids) if they are needed. These include:  Canes.  Walkers.  Scooters.  Crutches.  Turn on the lights when you go into a dark area. Replace any light bulbs as soon as they burn out.  Set up your furniture so you have a clear path. Avoid moving your furniture around.  If any of your floors are uneven, fix them.  If there are any pets around you, be aware of where they are.  Review your medicines with your doctor. Some medicines can make you feel dizzy. This can increase your chance of falling. Ask your doctor what other things that you can do to help prevent falls. This information is not intended to replace advice given to you by your health care provider. Make sure you discuss any questions you have with your health care provider. Document Released: 11/25/2008 Document Revised: 07/07/2015 Document Reviewed: 03/05/2014  2017 [employer]  Health Maintenance, Female Introduction Adopting a healthy lifestyle and getting preventive care can go a long way to promote health and wellness. Talk with your health care provider about what schedule of regular examinations is right for you. This is a good chance for you to check in with your provider about disease prevention and staying healthy. In between checkups, there are plenty of things you can do on your own. Experts have done a lot of research about which lifestyle changes and preventive measures are most likely to keep you healthy. Ask your health care provider for more information. Weight and diet Eat a healthy diet  Be sure to include plenty of vegetables, fruits, low-fat dairy products, and lean protein.  Do not eat a lot of foods high in solid fats, added sugars, or salt.  Get regular exercise. This is one of the most important things you can do for your  health.  Most adults should exercise for at least 150 minutes each week. The exercise should increase your heart rate and make you sweat (moderate-intensity exercise).  Most adults should also do strengthening exercises at least twice a week. This is in addition to the moderate-intensity exercise. Maintain a healthy weight  Body mass index (BMI) is a measurement that can be used to identify possible weight problems. It estimates body fat based on height and weight. Your health care provider can help determine your BMI and help you achieve or maintain a healthy weight.  For females 52 years of age and older:  A BMI below 18.5 is considered underweight.  A BMI of 18.5 to 24.9 is normal.  A BMI of 25 to 29.9 is considered overweight.  A BMI of 30 and above is considered obese. Watch levels of cholesterol and blood lipids  You should start having your blood tested for lipids and cholesterol at 75 years of age, then have this test every 5 years.  You may need to have your cholesterol levels checked more often if:  Your lipid or cholesterol levels are high.  You are older than 75 years of age.  You are at high risk for heart disease. Cancer screening Lung Cancer  Lung cancer screening is recommended for adults 83-24 years old who are at high risk for lung cancer because of a history of smoking.  A yearly low-dose CT scan of the lungs is recommended for people who:  Currently smoke.  Have quit within the past 15 years.  Have at least a 30-pack-year history of smoking. A pack year is smoking an average of one pack of cigarettes a day for 1 year.  Yearly screening should continue until it has been 15 years since you quit.  Yearly screening should stop if you develop a health problem that would prevent you from having lung cancer treatment. Breast Cancer  Practice breast self-awareness. This means understanding how your breasts normally appear and feel.  It also means doing  regular breast self-exams. Let your health care provider know about any changes, no matter how small.  If you are in your 20s or 30s, you should have a clinical breast exam (CBE) by a health care provider every 1-3 years as part of a regular health exam.  If you are 20 or older, have a CBE every year. Also consider having a breast X-ray (mammogram) every year.  If you have a family history of breast cancer, talk to your health care provider about genetic screening.  If you are at high risk for breast cancer, talk to your health care provider about having an MRI and a mammogram every year.  Breast cancer gene (BRCA) assessment is recommended for women who have family members with BRCA-related cancers. BRCA-related cancers include:  Breast.  Ovarian.  Tubal.  Peritoneal cancers.  Results of the assessment will determine the need for genetic counseling and BRCA1 and BRCA2 testing. Cervical Cancer  Your health care provider may recommend that you be screened regularly for cancer of the pelvic organs (ovaries, uterus, and vagina). This screening involves a pelvic examination, including checking for microscopic changes to the surface of your cervix (Pap test). You may be encouraged to have this screening done every 3 years, beginning at age 9.  For women ages 81-65, health care providers may recommend pelvic exams and Pap testing every 3 years, or they may recommend the Pap and pelvic exam, combined with testing for human papilloma virus (HPV), every 5 years. Some types of HPV increase your risk of cervical cancer. Testing for HPV may also be done on women of any age with unclear Pap test results.  Other health care providers may not recommend any screening for nonpregnant women who are considered low risk for pelvic cancer and who do not have symptoms. Ask your health care provider if a screening pelvic exam is right for you.  If you have had past treatment for cervical cancer or a condition  that could lead to cancer, you need Pap tests and screening  for cancer for at least 20 years after your treatment. If Pap tests have been discontinued, your risk factors (such as having a new sexual partner) need to be reassessed to determine if screening should resume. Some women have medical problems that increase the chance of getting cervical cancer. In these cases, your health care provider may recommend more frequent screening and Pap tests. Colorectal Cancer  This type of cancer can be detected and often prevented.  Routine colorectal cancer screening usually begins at 75 years of age and continues through 75 years of age.  Your health care provider may recommend screening at an earlier age if you have risk factors for colon cancer.  Your health care provider may also recommend using home test kits to check for hidden blood in the stool.  A small camera at the end of a tube can be used to examine your colon directly (sigmoidoscopy or colonoscopy). This is done to check for the earliest forms of colorectal cancer.  Routine screening usually begins at age 70.  Direct examination of the colon should be repeated every 5-10 years through 75 years of age. However, you may need to be screened more often if early forms of precancerous polyps or small growths are found. Skin Cancer  Check your skin from head to toe regularly.  Tell your health care provider about any new moles or changes in moles, especially if there is a change in a mole's shape or color.  Also tell your health care provider if you have a mole that is larger than the size of a pencil eraser.  Always use sunscreen. Apply sunscreen liberally and repeatedly throughout the day.  Protect yourself by wearing long sleeves, pants, a wide-brimmed hat, and sunglasses whenever you are outside. Heart disease, diabetes, and high blood pressure  High blood pressure causes heart disease and increases the risk of stroke. High blood  pressure is more likely to develop in:  People who have blood pressure in the high end of the normal range (130-139/85-89 mm Hg).  People who are overweight or obese.  People who are African American.  If you are 12-87 years of age, have your blood pressure checked every 3-5 years. If you are 75 years of age or older, have your blood pressure checked every year. You should have your blood pressure measured twice-once when you are at a hospital or clinic, and once when you are not at a hospital or clinic. Record the average of the two measurements. To check your blood pressure when you are not at a hospital or clinic, you can use:  An automated blood pressure machine at a pharmacy.  A home blood pressure monitor.  If you are between 79 years and 24 years old, ask your health care provider if you should take aspirin to prevent strokes.  Have regular diabetes screenings. This involves taking a blood sample to check your fasting blood sugar level.  If you are at a normal weight and have a low risk for diabetes, have this test once every three years after 75 years of age.  If you are overweight and have a high risk for diabetes, consider being tested at a younger age or more often. Preventing infection Hepatitis B  If you have a higher risk for hepatitis B, you should be screened for this virus. You are considered at high risk for hepatitis B if:  You were born in a country where hepatitis B is common. Ask your health care provider which  countries are considered high risk.  Your parents were born in a high-risk country, and you have not been immunized against hepatitis B (hepatitis B vaccine).  You have HIV or AIDS.  You use needles to inject street drugs.  You live with someone who has hepatitis B.  You have had sex with someone who has hepatitis B.  You get hemodialysis treatment.  You take certain medicines for conditions, including cancer, organ transplantation, and autoimmune  conditions. Hepatitis C  Blood testing is recommended for:  Everyone born from [birth year] through 1965.  Anyone with known risk factors for hepatitis C. Sexually transmitted infections (STIs)  You should be screened for sexually transmitted infections (STIs) including gonorrhea and chlamydia if:  You are sexually active and are younger than 75 years of age.  You are older than 75 years of age and your health care provider tells you that you are at risk for this type of infection.  Your sexual activity has changed since you were last screened and you are at an increased risk for chlamydia or gonorrhea. Ask your health care provider if you are at risk.  If you do not have HIV, but are at risk, it may be recommended that you take a prescription medicine daily to prevent HIV infection. This is called pre-exposure prophylaxis (PrEP). You are considered at risk if:  You are sexually active and do not regularly use condoms or know the HIV status of your partner(s).  You take drugs by injection.  You are sexually active with a partner who has HIV. Talk with your health care provider about whether you are at high risk of being infected with HIV. If you choose to begin PrEP, you should first be tested for HIV. You should then be tested every 3 months for as long as you are taking PrEP. Pregnancy  If you are premenopausal and you may become pregnant, ask your health care provider about preconception counseling.  If you may become pregnant, take 400 to 800 micrograms (mcg) of folic acid every day.  If you want to prevent pregnancy, talk to your health care provider about birth control (contraception). Osteoporosis and menopause  Osteoporosis is a disease in which the bones lose minerals and strength with aging. This can result in serious bone fractures. Your risk for osteoporosis can be identified using a bone density scan.  If you are 83 years of age or older, or if you are at risk for  osteoporosis and fractures, ask your health care provider if you should be screened.  Ask your health care provider whether you should take a calcium or vitamin D supplement to lower your risk for osteoporosis.  Menopause may have certain physical symptoms and risks.  Hormone replacement therapy may reduce some of these symptoms and risks. Talk to your health care provider about whether hormone replacement therapy is right for you. Follow these instructions at home:  Schedule regular health, dental, and eye exams.  Stay current with your immunizations.  Do not use any tobacco products including cigarettes, chewing tobacco, or electronic cigarettes.  If you are pregnant, do not drink alcohol.  If you are breastfeeding, limit how much and how often you drink alcohol.  Limit alcohol intake to no more than 1 drink per day for nonpregnant women. One drink equals 12 ounces of beer, 5 ounces of wine, or 1 ounces of hard liquor.  Do not use street drugs.  Do not share needles.  Ask your health care provider for  help if you need support or information about quitting drugs.  Tell your health care provider if you often feel depressed.  Tell your health care provider if you have ever been abused or do not feel safe at home. This information is not intended to replace advice given to you by your health care provider. Make sure you discuss any questions you have with your health care provider. Document Released: 08/14/2010 Document Revised: 07/07/2015 Document Reviewed: 11/02/2014  2017 [employer]vier

## 2016-01-26 NOTE — Progress Notes (Signed)
Pre visit review using our clinic review tool, if applicable. No additional management support is needed unless otherwise documented below in the visit note. 

## 2016-01-27 NOTE — Progress Notes (Signed)
Medical screening examination/treatment/procedure(s) were performed by non-physician practitioner and as supervising physician I was immediately available for consultation/collaboration. I agree with above. Sharmon Cheramie A Evelynne Spiers, MD 

## 2016-01-30 ENCOUNTER — Telehealth: Payer: Self-pay | Admitting: Internal Medicine

## 2016-01-30 ENCOUNTER — Other Ambulatory Visit: Payer: Self-pay | Admitting: Internal Medicine

## 2016-01-30 DIAGNOSIS — Z1231 Encounter for screening mammogram for malignant neoplasm of breast: Secondary | ICD-10-CM | POA: Diagnosis not present

## 2016-01-30 DIAGNOSIS — Z803 Family history of malignant neoplasm of breast: Secondary | ICD-10-CM | POA: Diagnosis not present

## 2016-01-30 LAB — HM MAMMOGRAPHY

## 2016-01-30 MED ORDER — ZOLPIDEM TARTRATE 5 MG PO TABS: 5.0000 mg | ORAL_TABLET | Freq: Every evening | ORAL | 3 refills | Status: DC | PRN

## 2016-01-30 NOTE — Telephone Encounter (Signed)
Received refill request from [facility] requesting refill for Zolpidem Tartrate 5 mg tab Quantity 30 take 1 tab PRN at bed time. Last refill was 06/2015 for 6 month supply, last ov was 12/23/15 but didn't not address about this med. Okey to refill? Please advise.

## 2016-01-30 NOTE — Telephone Encounter (Signed)
Rx faxed to [facility]. 

## 2016-02-07 ENCOUNTER — Encounter: Payer: Self-pay | Admitting: Internal Medicine

## 2016-02-07 NOTE — Progress Notes (Signed)
Results entered and sent to scan  

## 2016-02-14 ENCOUNTER — Ambulatory Visit (INDEPENDENT_AMBULATORY_CARE_PROVIDER_SITE_OTHER): Payer: Medicare Other | Admitting: Allergy and Immunology

## 2016-02-14 ENCOUNTER — Encounter: Payer: Self-pay | Admitting: Allergy and Immunology

## 2016-02-14 DIAGNOSIS — J3089 Other allergic rhinitis: Secondary | ICD-10-CM

## 2016-02-14 DIAGNOSIS — J302 Other seasonal allergic rhinitis: Secondary | ICD-10-CM | POA: Diagnosis not present

## 2016-02-14 MED ORDER — FLUTICASONE PROPIONATE 50 MCG/ACT NA SUSP: NASAL | 5 refills | Status: AC

## 2016-02-14 NOTE — Assessment & Plan Note (Signed)
   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  Aeroallergen avoidance measures have been discussed and provided in written form.  A prescription has been provided for fluticasone nasal spray, 2 sprays per nostril daily as needed. Proper nasal spray technique has been discussed and demonstrated.  I have also recommended nasal saline spray (i.e., Simply Saline) or nasal saline lavage (i.e., NeilMed) as needed prior to medicated nasal sprays.  Continue fexofenadine (Allegra) 180 mg daily as needed.

## 2016-02-14 NOTE — Patient Instructions (Signed)
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  Aeroallergen avoidance measures have been discussed and provided in written form.  A prescription has been provided for fluticasone nasal spray, 2 sprays per nostril daily as needed. Proper nasal spray technique has been discussed and demonstrated.  I have also recommended nasal saline spray (i.e., Simply Saline) or nasal saline lavage (i.e., NeilMed) as needed prior to medicated nasal sprays.  Continue fexofenadine (Allegra) 180 mg daily as needed.   Return in about 6 months (around 08/13/2016), or if symptoms worsen or fail to improve.

## 2016-02-14 NOTE — Progress Notes (Signed)
New Patient Note  RE: Hailey [name] MRN: [id number] DOB: 08/16/1940 Date of Office Visit: 02/14/2016  Referring provider: Hoyt Koch, * Primary care provider: Hoyt Koch, MD  Chief Complaint: Allergic Rhinitis    History of present illness: Hailey [name] is a 76 y.o. female seen today in consultation requested by Pricilla Holm, MD.  She has been in the care of Dr. Baird Lyons for allergic rhinitis and is transferring her care to our office as he has recently retired.  She has been on aeroallergen immunotherapy for the past 40 years with perceived benefit.  She takes fexofenadine 180 mg daily as needed for nasal congestion and postnasal drainage.  She is currently not on an intranasal medication.   Assessment and plan: Seasonal and perennial allergic rhinitis  Aeroallergen immunotherapy will be discontinued at this time.  Aeroallergen avoidance measures have been discussed and provided in written form.  A prescription has been provided for fluticasone nasal spray, 2 sprays per nostril daily as needed. Proper nasal spray technique has been discussed and demonstrated.  I have also recommended nasal saline spray (i.e., Simply Saline) or nasal saline lavage (i.e., NeilMed) as needed prior to medicated nasal sprays.  Continue fexofenadine (Allegra) 180 mg daily as needed.   Meds ordered this encounter  Medications  . fluticasone (FLONASE) 50 MCG/ACT nasal spray    Sig: Use 1 spray each nostril 1-2 times daily as needed    Dispense:  16 g    Refill:  5    Please deliver to patient    Diagnostics: None.  Physical examination: Blood pressure 118/68, pulse 80, temperature 98.2 F (36.8 C), temperature source Oral, height 5' 5.75" (1.67 m), weight 131 lb 9.6 oz (59.7 kg).  General: Alert, interactive, in no acute distress. HEENT: TMs pearly gray, turbinates minimally edematous without discharge, post-pharynx mildly erythematous. Neck: Supple without  lymphadenopathy. Lungs: Clear to auscultation without wheezing, rhonchi or rales. CV: Normal S1, S2 without murmurs. Abdomen: Nondistended, nontender. Skin: Warm and dry, without lesions or rashes. Extremities:  No clubbing, cyanosis or edema. Neuro:   Grossly intact.  Review of systems:  Review of systems negative except as noted in HPI / PMHx or noted below: Review of Systems  Constitutional: Negative.   HENT: Negative.   Eyes: Negative.   Respiratory: Negative.   Cardiovascular: Negative.   Gastrointestinal: Negative.   Genitourinary: Negative.   Musculoskeletal: Negative.   Skin: Negative.   Neurological: Negative.   Endo/Heme/Allergies: Negative.   Psychiatric/Behavioral: Negative.     Past medical history:  Past Medical History:  Diagnosis Date  . Allergic rhinitis, cause unspecified   . Carotid artery disease ([place]) 12/13/2014   1-39% stenosis of bilateral CCAs.   Repeat ultrasound 11/2016.  Marland Kitchen Cervical spine fracture ([place]) 02/01/2014  . Chronic insomnia   . Chronic interstitial cystitis   . Closed fracture of rib(s), unspecified   . Disorder of bone and cartilage, unspecified   . Esophageal reflux   . 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 OSA (obstructive sleep apnea)   . Osteoarthrosis, unspecified whether generalized or localized, unspecified site   . Other and unspecified hyperlipidemia   . Personal history of colonic adenomas 11/28/2012  . Personal history of other disorders of nervous system and sense organs   . SAH (subarachnoid hemorrhage) ([place]) 02/02/2014  . Unspecified closed fracture of pelvis    x 2 2004/2006  . Unspecified essential hypertension  Past surgical history:  Past Surgical History:  Procedure Laterality Date  . ANKLE FUSION Left   . BACK SURGERY     ; has  had 8 back surgeries  . CERVICAL FUSION     C4-7  . COLONOSCOPY    . CYSTOSCOPY    . HERNIA REPAIR     umbilical  . LUMBAR FUSION      L2-3  . SACRAL FUSION  07/2012  . TIBIA FRACTURE SURGERY Right   . TONSILLECTOMY    . TOTAL HIP ARTHROPLASTY Right 2012  . TUBAL LIGATION    . UPPER GASTROINTESTINAL ENDOSCOPY      Family history: Family History  Problem Relation Age of Onset  . Dementia Mother   . Hypertension Father   . Heart attack Father   . Allergic rhinitis Father   . Cancer Maternal Aunt     breast cancer  . Alzheimer's disease Maternal Aunt   . Stroke Paternal Uncle   . Cancer Cousin     breast cancer  . Colon cancer Neg Hx   . Angioedema Neg Hx   . Asthma Neg Hx   . Eczema Neg Hx     Social history: Social History   Social History  . Marital status: Widowed    Spouse name: N/A  . Number of children: N/A  . Years of education: N/A   Occupational History  . 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 Smoking status: Former Smoker    Packs/day: 2.00    Years: 10.00    Types: Cigarettes    Quit date: 02/13/1967  . Smokeless tobacco: Never Used     Comment: smoked 1961-1969 , up to 3 ppd, mainly 2 ppd  . Alcohol use No  . Drug use: No  . Sexual activity: Not on file   Other Topics Concern  . Not on file   Social History Narrative  . No narrative on file   Environmental History: The patient lives in a house with hardwood floors throughout, gas heat, and central air.  There is a cat in the house which has access to her bedroom.  She is a former cigarette smoker having quit in 1969 with a 15-pack-year history.  Allergies as of 02/14/2016      Reactions   Resorcinol Swelling, Other (See Comments)   blisters   Other Other (See Comments), Rash      Medication List       Accurate as of 02/14/16  6:27 PM. Always use your most recent med list.          ALIGN 4 MG Caps Take 4 mg by mouth daily.   atorvastatin 40 MG tablet Commonly known as:  LIPITOR TAKE 1 TABLET ONCE DAILY.   CALCIUM + D PO Take by mouth daily.   celecoxib 200 MG capsule Commonly  known as:  CELEBREX TAKE 1 CAP ONCE DAILY AS NEEDED--AVOID DAILY USE DUE TO GI AND CARDIAC POTENTIAL ADVERSE EFFECTS   cholecalciferol 1000 units tablet Commonly known as:  VITAMIN D Take 1,000 Units by mouth daily.   denosumab 60 MG/ML Soln injection Commonly known as:  PROLIA Inject 60 mg into the skin every 6 (six) months. Administer in upper arm, thigh, or abdomen   EPIPEN 2-PAK 0.3 mg/0.3 mL Soaj injection Generic drug:  EPINEPHrine Inject 0.3 mg into the muscle daily as needed. Allergic reaction   escitalopram 20 MG tablet Commonly known as:  LEXAPRO TAKE 1 TABLET ONCE DAILY.   ferrous sulfate 325 (65 FE) MG tablet Take 325 mg by mouth daily with breakfast.   fexofenadine 180 MG tablet Commonly known as:  ALLEGRA Take 180 mg by mouth daily.   fluticasone 50 MCG/ACT nasal spray Commonly known as:  FLONASE Use 1 spray each nostril 1-2 times daily as needed   LORazepam 1 MG tablet Commonly known as:  ATIVAN TAKE 1-1&1/2 TABS AT BEDTIME IF NEEDED FOR SLEEP.   methocarbamol 500 MG tablet Commonly known as:  ROBAXIN Take 1 tablet by mouth daily as needed.   MULTIVITAMIN PO Take 1 tablet by mouth daily.   NONFORMULARY OR COMPOUNDED ITEM Allergy Vaccine 1:50 Given at Home   OSTEO BI-FLEX TRIPLE STRENGTH PO Take 1 tablet by mouth 2 (two) times daily.   ranitidine 150 MG tablet Commonly known as:  ZANTAC Take 1 tablet (150 mg total) by mouth 2 (two) times daily.   traMADol-acetaminophen 37.5-325 MG tablet Commonly known as:  ULTRACET Take 1 tablet by mouth 3 (three) times daily.   zolpidem 5 MG tablet Commonly known as:  AMBIEN Take 1 tablet (5 mg total) by mouth at bedtime as needed.       Known medication allergies: Allergies  Allergen Reactions  . Resorcinol Swelling and Other (See Comments)    blisters  . Other Other (See Comments) and Rash    I appreciate the opportunity to take part in Hailey [name]'s care. Please do not hesitate to contact me with  questions.  Sincerely,   R. Edgar Frisk, MD

## 2016-03-13 ENCOUNTER — Ambulatory Visit: Payer: Medicare Other | Admitting: Allergy and Immunology

## 2016-03-21 ENCOUNTER — Telehealth: Payer: Self-pay | Admitting: *Deleted

## 2016-03-21 MED ORDER — METHOCARBAMOL 500 MG PO TABS: 500.0000 mg | ORAL_TABLET | Freq: Every day | ORAL | 1 refills | Status: DC | PRN

## 2016-03-21 NOTE — Telephone Encounter (Signed)
Pt left msg on triage stating she need a refill on her Methocarbamol...Hailey [name]

## 2016-03-21 NOTE — Telephone Encounter (Signed)
Sent in

## 2016-03-21 NOTE — Telephone Encounter (Signed)
Notified pt rx sent to [facility].../lmb 

## 2016-03-26 DIAGNOSIS — M810 Age-related osteoporosis without current pathological fracture: Secondary | ICD-10-CM | POA: Diagnosis not present

## 2016-03-26 DIAGNOSIS — R5383 Other fatigue: Secondary | ICD-10-CM | POA: Diagnosis not present

## 2016-03-26 DIAGNOSIS — E559 Vitamin D deficiency, unspecified: Secondary | ICD-10-CM | POA: Diagnosis not present

## 2016-03-26 DIAGNOSIS — M5030 Other cervical disc degeneration, unspecified cervical region: Secondary | ICD-10-CM | POA: Diagnosis not present

## 2016-03-28 DIAGNOSIS — M5030 Other cervical disc degeneration, unspecified cervical region: Secondary | ICD-10-CM | POA: Diagnosis not present

## 2016-03-28 DIAGNOSIS — M810 Age-related osteoporosis without current pathological fracture: Secondary | ICD-10-CM | POA: Diagnosis not present

## 2016-04-05 DIAGNOSIS — L820 Inflamed seborrheic keratosis: Secondary | ICD-10-CM | POA: Diagnosis not present

## 2016-04-05 DIAGNOSIS — D2339 Other benign neoplasm of skin of other parts of face: Secondary | ICD-10-CM | POA: Diagnosis not present

## 2016-04-05 DIAGNOSIS — D1801 Hemangioma of skin and subcutaneous tissue: Secondary | ICD-10-CM | POA: Diagnosis not present

## 2016-04-12 DIAGNOSIS — D2339 Other benign neoplasm of skin of other parts of face: Secondary | ICD-10-CM | POA: Diagnosis not present

## 2016-04-12 DIAGNOSIS — L821 Other seborrheic keratosis: Secondary | ICD-10-CM | POA: Diagnosis not present

## 2016-04-12 DIAGNOSIS — L820 Inflamed seborrheic keratosis: Secondary | ICD-10-CM | POA: Diagnosis not present

## 2016-04-12 DIAGNOSIS — D1801 Hemangioma of skin and subcutaneous tissue: Secondary | ICD-10-CM | POA: Diagnosis not present

## 2016-04-12 DIAGNOSIS — L738 Other specified follicular disorders: Secondary | ICD-10-CM | POA: Diagnosis not present

## 2016-05-01 ENCOUNTER — Other Ambulatory Visit: Payer: Self-pay | Admitting: Internal Medicine

## 2016-06-01 ENCOUNTER — Other Ambulatory Visit: Payer: Self-pay | Admitting: Internal Medicine

## 2016-06-04 ENCOUNTER — Other Ambulatory Visit: Payer: Self-pay | Admitting: Internal Medicine

## 2016-06-04 ENCOUNTER — Telehealth: Payer: Self-pay | Admitting: Internal Medicine

## 2016-06-04 NOTE — Telephone Encounter (Signed)
Patient has an appointment on Thursday April 26th.

## 2016-06-04 NOTE — Telephone Encounter (Signed)
Pt called back . Please call back. Phone number verified.

## 2016-06-04 NOTE — Telephone Encounter (Signed)
Will decline as she has been getting from pain management although she told us she wanted [place] to write and she has been getting additional zolpidem from other providers at the same time as from [place]. She needs visit to discuss before any controlled refills.

## 2016-06-04 NOTE — Telephone Encounter (Signed)
Patient aware, and is going to schedule, tried to transfer call and phone cut out

## 2016-06-04 NOTE — Telephone Encounter (Signed)
Tried calling number and it was saying number unavailable, left message with daughter to ask her mother to call back and speak with me

## 2016-06-07 ENCOUNTER — Ambulatory Visit (INDEPENDENT_AMBULATORY_CARE_PROVIDER_SITE_OTHER): Payer: Medicare Other | Admitting: Internal Medicine

## 2016-06-07 ENCOUNTER — Encounter: Payer: Self-pay | Admitting: Internal Medicine

## 2016-06-07 DIAGNOSIS — G894 Chronic pain syndrome: Secondary | ICD-10-CM | POA: Diagnosis not present

## 2016-06-07 DIAGNOSIS — G4700 Insomnia, unspecified: Secondary | ICD-10-CM

## 2016-06-07 MED ORDER — TRAMADOL-ACETAMINOPHEN 37.5-325 MG PO TABS: 1.0000 | ORAL_TABLET | Freq: Four times a day (QID) | ORAL | 1 refills | Status: DC | PRN

## 2016-06-07 NOTE — Patient Instructions (Signed)
We have done the tramadol prescription and sent in to the pharmacy.   We have done the urine drug screen today and have given you a copy of our controlled substance policy.

## 2016-06-07 NOTE — Progress Notes (Signed)
   Subjective:    Patient ID: Hailey [name], female    DOB: [birth year], 76 y.o.   MRN: [id number]  HPI The patient is a 76 YO female coming in for her chronic pain. She does have back pain which has been well controlled on tramadol/APAP for many year at this time. She had been seeing pain management but there was some mix ups with medications being prescribed by many providers and she wanted to consolidate to avoid medical error due to lack of communication. We agreed to take her tramadol prescription on however on review of [place] controlled substance database she had gotten ambien filled by another provider the same day she filled ambien from [place]. She states that she was given this after a surgery and she did not take hardly any of them. She did not take both ambien at one time. We discuss that if we take on the tramadol prescription that she is not able to get controlled substances from other providers at all even if they offer to her. She does use about 200 tramadol per month. On review of the database this dosing has been stable for some time and only 1 provider was giving her this in the past. She is also taking [place] which is prescribed by [place].   Review of Systems  Constitutional: Negative for activity change, appetite change, diaphoresis, fatigue, fever and unexpected weight change.  HENT: Negative.   Eyes: Negative.   Respiratory: Negative.   Cardiovascular: Negative.   Gastrointestinal: Negative.   Musculoskeletal: Positive for arthralgias and back pain. Negative for gait problem, joint swelling and myalgias.  Skin: Negative.   Neurological: Negative.   Psychiatric/Behavioral: Positive for sleep disturbance.      Objective:   Physical Exam  Constitutional: She is oriented to person, place, and time. She appears well-developed and well-nourished.  HENT:  Head: Normocephalic and atraumatic.  Eyes: EOM are normal.  Neck: Normal range of motion.  Cardiovascular: Normal rate and regular  rhythm.   Pulmonary/Chest: Effort normal. No respiratory distress. She has no wheezes. She has no rales.  Abdominal: Soft. She exhibits no distension. There is no tenderness. There is no rebound.  Musculoskeletal: She exhibits no edema.  Neurological: She is alert and oriented to person, place, and time. Coordination normal.  Skin: Skin is warm and dry.  Psychiatric: She has a normal mood and affect.   Vitals:   06/07/16 1334  BP: 120/72  Pulse: 76  Resp: 14  Temp: 98.4 F (36.9 C)  TempSrc: Oral  SpO2: 99%  Weight: 132 lb (59.9 kg)  Height: 5' 5.75" (1.67 m)      Assessment & Plan:  Visit time 25 minutes: greater than 50% of that time was spent in counseling and coordination of care with the patient face to face: counseling about controlled substances and the risk of accidental overdose with taking substances outside prescription as well as review of prior filling habits and pain location and duration and prior treatments.

## 2016-06-07 NOTE — Progress Notes (Signed)
Pre visit review using our clinic review tool, if applicable. No additional management support is needed unless otherwise documented below in the visit note. 

## 2016-06-08 ENCOUNTER — Encounter: Payer: Self-pay | Admitting: Internal Medicine

## 2016-06-08 DIAGNOSIS — Z79899 Other long term (current) drug therapy: Secondary | ICD-10-CM | POA: Diagnosis not present

## 2016-06-08 DIAGNOSIS — Z79891 Long term (current) use of opiate analgesic: Secondary | ICD-10-CM | POA: Diagnosis not present

## 2016-06-08 NOTE — Assessment & Plan Note (Signed)
Rx for tramadol/APAP #200 today. UDS obtained. [place] narcotic database reviewed and discussed with the patient. Controlled substance contract discussed with patient and signed. She understands that she cannot get controlled substances from other providers without talking to [place] first even if it is an acute issue.

## 2016-06-08 NOTE — Assessment & Plan Note (Signed)
She does get ambien from [place] and now knows that she is not to get any from other providers and if they offer she needs to decline.

## 2016-06-22 ENCOUNTER — Other Ambulatory Visit: Payer: Self-pay | Admitting: Internal Medicine

## 2016-07-06 ENCOUNTER — Other Ambulatory Visit: Payer: Self-pay | Admitting: Internal Medicine

## 2016-07-07 ENCOUNTER — Other Ambulatory Visit: Payer: Self-pay | Admitting: Internal Medicine

## 2016-07-26 ENCOUNTER — Other Ambulatory Visit: Payer: Self-pay | Admitting: Internal Medicine

## 2016-08-09 ENCOUNTER — Other Ambulatory Visit: Payer: Self-pay | Admitting: Internal Medicine

## 2016-08-09 NOTE — Telephone Encounter (Signed)
Please advise, last Dispensed ambien 07/11/2016 with 30 tabs

## 2016-08-13 ENCOUNTER — Other Ambulatory Visit: Payer: Self-pay | Admitting: Internal Medicine

## 2016-08-13 NOTE — Telephone Encounter (Signed)
Med was filled 08/09/2016

## 2016-08-13 NOTE — Telephone Encounter (Signed)
Pt called in and needs refill on this Med asap.  Hailey [name] out and crawford out

## 2016-08-25 ENCOUNTER — Other Ambulatory Visit: Payer: Self-pay | Admitting: Internal Medicine

## 2016-08-27 NOTE — Telephone Encounter (Signed)
Please advise 

## 2016-09-13 ENCOUNTER — Other Ambulatory Visit: Payer: Self-pay | Admitting: Family

## 2016-09-13 NOTE — Telephone Encounter (Signed)
Faxed

## 2016-09-13 NOTE — Telephone Encounter (Signed)
Last refill was 08/13/16 per [place] [facility]

## 2016-09-25 DIAGNOSIS — R5383 Other fatigue: Secondary | ICD-10-CM | POA: Diagnosis not present

## 2016-09-25 DIAGNOSIS — M810 Age-related osteoporosis without current pathological fracture: Secondary | ICD-10-CM | POA: Diagnosis not present

## 2016-09-25 DIAGNOSIS — E559 Vitamin D deficiency, unspecified: Secondary | ICD-10-CM | POA: Diagnosis not present

## 2016-10-01 ENCOUNTER — Other Ambulatory Visit: Payer: Self-pay | Admitting: Internal Medicine

## 2016-10-01 ENCOUNTER — Other Ambulatory Visit: Payer: Self-pay | Admitting: Family

## 2016-10-02 NOTE — Telephone Encounter (Signed)
Faxed script back to [facility]...Johny Chess

## 2016-10-03 DIAGNOSIS — M810 Age-related osteoporosis without current pathological fracture: Secondary | ICD-10-CM | POA: Diagnosis not present

## 2016-10-16 ENCOUNTER — Other Ambulatory Visit: Payer: Self-pay | Admitting: Family

## 2016-10-16 NOTE — Telephone Encounter (Signed)
Rx faxed

## 2016-10-16 NOTE — Telephone Encounter (Signed)
Last refill was 09/13/16 per [place] [facility]

## 2016-11-01 ENCOUNTER — Other Ambulatory Visit: Payer: Self-pay | Admitting: Family

## 2016-11-01 DIAGNOSIS — M8589 Other specified disorders of bone density and structure, multiple sites: Secondary | ICD-10-CM | POA: Diagnosis not present

## 2016-11-01 LAB — HM DEXA SCAN: HM Dexa Scan: -1.4

## 2016-11-09 ENCOUNTER — Encounter: Payer: Self-pay | Admitting: Internal Medicine

## 2016-11-15 ENCOUNTER — Other Ambulatory Visit: Payer: Self-pay | Admitting: Internal Medicine

## 2016-11-15 ENCOUNTER — Other Ambulatory Visit: Payer: Self-pay | Admitting: Family

## 2016-11-16 ENCOUNTER — Other Ambulatory Visit: Payer: Self-pay | Admitting: Family

## 2016-11-16 NOTE — Telephone Encounter (Signed)
Faxed to [facility] 

## 2016-11-19 DIAGNOSIS — Z23 Encounter for immunization: Secondary | ICD-10-CM | POA: Diagnosis not present

## 2016-11-29 ENCOUNTER — Other Ambulatory Visit: Payer: Self-pay | Admitting: Family

## 2016-11-29 ENCOUNTER — Other Ambulatory Visit: Payer: Self-pay | Admitting: Internal Medicine

## 2016-12-13 ENCOUNTER — Ambulatory Visit (INDEPENDENT_AMBULATORY_CARE_PROVIDER_SITE_OTHER): Payer: Medicare Other | Admitting: Cardiovascular Disease

## 2016-12-13 ENCOUNTER — Encounter: Payer: Self-pay | Admitting: Cardiovascular Disease

## 2016-12-13 VITALS — BP 104/74 | HR 80 | Ht 66.0 in | Wt 126.6 lb

## 2016-12-13 DIAGNOSIS — E7800 Pure hypercholesterolemia, unspecified: Secondary | ICD-10-CM | POA: Diagnosis not present

## 2016-12-13 DIAGNOSIS — I6523 Occlusion and stenosis of bilateral carotid arteries: Secondary | ICD-10-CM | POA: Diagnosis not present

## 2016-12-13 DIAGNOSIS — I10 Essential (primary) hypertension: Secondary | ICD-10-CM | POA: Diagnosis not present

## 2016-12-13 NOTE — Patient Instructions (Addendum)
Medication Instructions:  Your physician recommends that you continue on your current medications as directed. Please refer to the Current Medication list given to you today.  Labwork: FASTING LP/CMET WHEN YOU RETURN FOR STUDY  Testing/Procedures: Your physician has requested that you have a carotid duplex. This test is an ultrasound of the carotid arteries in your neck. It looks at blood flow through these arteries that supply the brain with blood. Allow one hour for this exam. There are no restrictions or special instructions.  Follow-Up: Your physician wants you to follow-up in: 1 year ov  You will receive a reminder letter in the mail two months in advance. If you don't receive a letter, please call our office to schedule the follow-up appointment.  If you need a refill on your cardiac medications before your next appointment, please call your pharmacy.

## 2016-12-13 NOTE — Progress Notes (Signed)
Cardiology Office Note   Date:  12/13/2016   ID:  Kizzy, Olafson 1940-03-20, MRN [id number]  PCP:  Hoyt Koch, MD  Cardiologist:   Skeet Latch, MD   No chief complaint on file.    Patient ID:  Hailey [name] is a 75 y.o. female with hypertension, hyperlipidemia, subarachnoid hemorrhage, and alcohol abuse who presents for follow-up.  Hailey [name] was first seen 11/2014 with exertional dyspnea.   Hailey [name] had an echo 12/23/14 that showed grade 1 diastolic dysfunction and mild dilation of the ascending aorta. Sysotic function was normal and there were no wall motion abnormalities. Hailey [name] also had carotid Dopplers that showed 1-39% stenosis bilaterally.  At Hailey [name] last appointment Hailey [name] had experienced two episodes of syncope on 08/2015. The episode occurred upon standing up and walking to the bathroom. Hailey [name] was able to get back in Hailey [name] bed and had a recurrent event a few hours later.  Hailey [name] decided to stop Hailey [name] blood pressure medications and has not had any recurrent symptoms.  This was in the setting of a 20 lb intentional weight loss.   Since Hailey [name] last appointment Hailey [name] has been feeling well.  Hailey [name] only complaint is intermittent episodes of severe pain in Hailey [name] anterior neck.  This occurs sporadically and typically when Hailey [name] is turning Hailey [name] head.  Hailey [name] notices it most when getting in bed at night.  The episodes last for approximately 30 seconds and are not associated with exertion.  There is no shortness of breath.  It feels like it might be a muscle spasming.  Hailey [name] has had 2 neck surgeries, most recently 04/2013.  Hailey [name] has no chest pain or shortness of breath.  Hailey [name] has not experienced any lower extremity edema, orthopnea, or PND.  Hailey [name] has not been exercising much lately.    Past Medical History:  Diagnosis Date  . Allergic rhinitis, cause unspecified   . Carotid artery disease ([place]) 12/13/2014   1-39% stenosis of bilateral CCAs.   Repeat ultrasound 11/2016.  Marland Kitchen Cervical spine fracture ([place])  02/01/2014  . Chronic insomnia   . Chronic interstitial cystitis   . Closed fracture of rib(s), unspecified   . Disorder of bone and cartilage, unspecified   . Esophageal reflux   . 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 OSA (obstructive sleep apnea)   . Osteoarthrosis, unspecified whether generalized or localized, unspecified site   . Other and unspecified hyperlipidemia   . Personal history of colonic adenomas 11/28/2012  . Personal history of other disorders of nervous system and sense organs   . SAH (subarachnoid hemorrhage) ([place]) 02/02/2014  . Unspecified closed fracture of pelvis    x 2 2004/2006  . Unspecified essential hypertension     Past Surgical History:  Procedure Laterality Date  . ANKLE FUSION Left   . BACK SURGERY     ; has  had 8 back surgeries  . CERVICAL FUSION     C4-7  . COLONOSCOPY    . CYSTOSCOPY    . HERNIA REPAIR     umbilical  . LUMBAR FUSION     L2-3  . SACRAL FUSION  07/2012  . TIBIA FRACTURE SURGERY Right   . TONSILLECTOMY    . TOTAL HIP ARTHROPLASTY Right 2012  . TUBAL LIGATION    . UPPER GASTROINTESTINAL ENDOSCOPY       Current Outpatient Prescriptions  Medication Sig Dispense Refill  . atorvastatin (LIPITOR) 40 MG tablet TAKE 1  TABLET ONCE DAILY. 90 tablet 0  . Bacillus Coagulans-Inulin (PROBIOTIC-PREBIOTIC PO) Take 1 capsule by mouth daily.    . Calcium Carbonate-Vitamin D (CALCIUM + D PO) Take by mouth daily.    . celecoxib (CELEBREX) 200 MG capsule TAKE 1 CAP ONCE DAILY AS NEEDED--AVOID DAILY USE DUE TO GI AND CARDIAC POTENTIAL ADVERSE EFFECTS 30 capsule 0  . denosumab (PROLIA) 60 MG/ML SOLN injection Inject 60 mg into the skin every 6 (six) months. Administer in upper arm, thigh, or abdomen    . EPINEPHrine (EPIPEN 2-PAK) 0.3 mg/0.3 mL IJ SOAJ injection Inject 0.3 mg into the muscle daily as needed. Allergic reaction    . escitalopram (LEXAPRO) 20 MG tablet TAKE 1 TABLET ONCE DAILY. 90 tablet 0  .  ferrous sulfate 325 (65 FE) MG tablet Take 325 mg by mouth daily with breakfast.      . fexofenadine (ALLEGRA) 180 MG tablet Take 180 mg by mouth daily.      . fluticasone (FLONASE) 50 MCG/ACT nasal spray Use 1 spray each nostril 1-2 times daily as needed 16 g 5  . LORazepam (ATIVAN) 1 MG tablet TAKE 1-1&1/2 TABS AT BEDTIME IF NEEDED FOR SLEEP. 60 tablet 2  . methocarbamol (ROBAXIN) 500 MG tablet TAKE 1 TABLET THREE TIMES DAILY AS NEEDED FOR SPASM. 90 tablet 0  . Misc Natural Products (OSTEO BI-FLEX TRIPLE STRENGTH PO) Take 1 tablet by mouth 2 (two) times daily.     . Multiple Vitamin (MULTIVITAMIN PO) Take 1 tablet by mouth daily.     . Probiotic Product (ALIGN) 4 MG CAPS Take 4 mg by mouth daily.     . ranitidine (ZANTAC) 150 MG tablet TAKE 1 TABLET TWICE DAILY. 180 tablet 0  . traMADol-acetaminophen (ULTRACET) 37.5-325 MG tablet TAKE 1 OR 2 TABLETS EVERY 6 HOURS AS NEEDED. 200 tablet 0  . zolpidem (AMBIEN) 5 MG tablet TAKE 1 TABLET AT BEDTIME AS NEEDED. 30 tablet 0   No current facility-administered medications for this visit.     Allergies:   Resorcinol and Other    Social History:  The patient  reports that Hailey [name] quit smoking about 49 years ago. Hailey [name] smoking use included Cigarettes. Hailey [name] has a 20.00 pack-year smoking history. Hailey [name] has never used smokeless tobacco. Hailey [name] reports that Hailey [name] does not drink alcohol or use drugs.   Family History:  The patient's family history includes Allergic rhinitis in Hailey [name] father; Alzheimer's disease in Hailey [name] maternal aunt; Cancer in Hailey [name] cousin and maternal aunt; Dementia in Hailey [name] mother; Heart attack in Hailey [name] father; Hypertension in Hailey [name] father; Stroke in Hailey [name] paternal uncle.    ROS:  Please see the history of present illness.   Otherwise, review of systems are positive for none.   All other systems are reviewed and negative.    PHYSICAL EXAM: VS:  BP 104/74   Pulse 80   Ht 5\' 6"  (1.676 m)   Wt 57.4 kg (126 lb 9.6 oz)   BMI 20.43 kg/m  , BMI Body mass index is  20.43 kg/m. GENERAL:  Well appearing no acute distress.   HEENT:  Pupils equal round and reactive, fundi not visualized, oral mucosa unremarkable NECK:  No jugular venous distention, waveform within normal limits, carotid upstroke brisk and symmetric, no bruits no tenderness to palpation. LUNGS:  Clear to auscultation bilaterally.  No crackles, wheezes, or rhonchi. HEART:  RRR.  PMI not displaced or sustained,S1 and S2 within normal limits, no S3, no S4, no clicks, no rubs, no murmurs ABD:  Flat, positive bowel sounds normal in frequency in pitch, no bruits, no rebound, no guarding, no midline pulsatile mass, no hepatomegaly, no splenomegaly EXT:  2 plus pulses throughout, no edema, no cyanosis no clubbing SKIN:  No rashes no nodules NEURO:  Cranial nerves II through XII grossly intact, motor grossly intact throughout PSYCH:  Cognitively intact, oriented to person place and time   EKG:  EKG is ordered today. 11/24/15: Sinus rhythm rate 71 bpm.  Low voltage.   11/26/14:sinus rhythm at 83 BMP. Low voltage limb leads.  Prior inferior infarct.  L axis deviation.   12/13/16: Sinus rhythm.  Rate 80 bpm.  Low voltage limb leads.  Prior inferior infarct.  L axis deviation.    Echo 12/23/14: Study Conclusions  - Left ventricle: The cavity size was normal. Wall thickness was  normal. Systolic function was normal. The estimated ejection  fraction was in the range of 55% to 60%. Wall motion was normal;  there were no regional wall motion abnormalities. Doppler  parameters are consistent with abnormal left ventricular  relaxation (grade 1 diastolic dysfunction). - Aortic valve: Trileaflet; mildly thickened, mildly calcified  leaflets. - Aorta: The aorta was mildly dilated. Ascending aortic diameter:  38 mm (S). - Mitral valve: Calcified annulus.  Lexiscan Cardiolite 12/07/14:  Nuclear stress EF: 67%.  The left ventricular ejection fraction is hyperdynamic (>65%).  There was no ST  segment deviation noted during stress.  This is a low risk study.  Technically suboptimal due to subdiaphragmatic activity; low risk stress nuclear study with a medium size, severe intensity, fixed inferior apical defect suggestive of prior infarct; no ischemia; EF 67 with normal wall motion.  Carotid Doppler 12/07/14:   1-39% stenosis of bilateral common carotid arteries.  recommended follow-up 11/2016.  Recent Labs: No results found for requested labs within last 8760 hours.    Lipid Panel    Component Value Date/Time   CHOL 166 06/14/2015 1640   TRIG 102.0 06/14/2015 1640   TRIG 40 12/25/2005 1439   HDL 42.90 06/14/2015 1640   CHOLHDL 4 06/14/2015 1640   VLDL 20.4 06/14/2015 1640   LDLCALC 102 (H) 06/14/2015 1640   LDLDIRECT 170.9 03/20/2010 1356      Wt Readings from Last 3 Encounters:  12/13/16 57.4 kg (126 lb 9.6 oz)  06/07/16 59.9 kg (132 lb)  02/14/16 59.7 kg (131 lb 9.6 oz)      ASSESSMENT AND PLAN:  # Carotid artery disease: Mild 11/2014.  Repeat carotid Dopplers. Continue atorvastatin.  Consider aspirin.  If they are stable we will not repeat imaging in the future.  # Palpitations: Stable.   # Hyperlipidemia:  Continue atorvastatin.  Hailey [name] wi return for fasting lipids and a CMP.ll   # Hypertension:  Blood pressure remains well-controlled off all antihypertensives.   # Neck pain: Recommended that Hailey [name] follow up with Hailey [name] surgeon to ensure that the hardware is OK.    Current medicines are reviewed at length with the patient today.  The patient does not have concerns regarding medicines.  The following changes have been made:  no change  Labs/ tests ordered today include:   Orders Placed This Encounter  Procedures  . Lipid panel  . Comprehensive metabolic panel  . EKG 12-Lead     Disposition:   FU with Lawren Sexson C. Oval Linsey, MD in 1 year.   Signed, Skeet Latch, MD  12/13/2016 4:55 PM    [facility]

## 2016-12-14 ENCOUNTER — Other Ambulatory Visit: Payer: Self-pay | Admitting: Internal Medicine

## 2016-12-17 ENCOUNTER — Other Ambulatory Visit: Payer: Self-pay | Admitting: Internal Medicine

## 2016-12-17 NOTE — Telephone Encounter (Signed)
Faxed to [facility]

## 2016-12-18 NOTE — Telephone Encounter (Signed)
Faxed to [facility] 

## 2016-12-27 ENCOUNTER — Ambulatory Visit (HOSPITAL_COMMUNITY)
Admission: RE | Admit: 2016-12-27 | Discharge: 2016-12-27 | Disposition: A | Discharge status: Home / Self Care | Payer: Medicare Other | Source: Ambulatory Visit | Attending: Cardiovascular Disease | Admitting: Cardiovascular Disease

## 2016-12-27 DIAGNOSIS — I6523 Occlusion and stenosis of bilateral carotid arteries: Secondary | ICD-10-CM

## 2016-12-27 DIAGNOSIS — E785 Hyperlipidemia, unspecified: Secondary | ICD-10-CM | POA: Insufficient documentation

## 2016-12-27 DIAGNOSIS — I10 Essential (primary) hypertension: Secondary | ICD-10-CM | POA: Diagnosis not present

## 2016-12-31 DIAGNOSIS — Z79891 Long term (current) use of opiate analgesic: Secondary | ICD-10-CM | POA: Diagnosis not present

## 2016-12-31 DIAGNOSIS — M150 Primary generalized (osteo)arthritis: Secondary | ICD-10-CM | POA: Diagnosis not present

## 2016-12-31 DIAGNOSIS — M47814 Spondylosis without myelopathy or radiculopathy, thoracic region: Secondary | ICD-10-CM | POA: Diagnosis not present

## 2016-12-31 DIAGNOSIS — G894 Chronic pain syndrome: Secondary | ICD-10-CM | POA: Diagnosis not present

## 2017-01-01 ENCOUNTER — Other Ambulatory Visit: Payer: Self-pay | Admitting: Internal Medicine

## 2017-01-25 NOTE — Progress Notes (Signed)
Subjective:   Hailey [name] is a 76 y.o. female who presents for Medicare Annual (Subsequent) preventive examination.  Review of Systems:  No ROS.  Medicare Wellness Visit. Additional risk factors are reflected in the social history.  Cardiac Risk Factors include: advanced age (>1men, >52 women);dyslipidemia;hypertension Sleep patterns: gets up 2 times nightly to void and sleeps 6-7 hours nightly.    Home Safety/Smoke Alarms: Feels safe in home. Smoke alarms in place.  Living environment; residence and Firearm Safety: 1-story house/ trailer, no firearms. Lives alone, no needs for DME, good support system Seat Belt Safety/Bike Helmet: Wears seat belt.     Objective:     Vitals: BP 108/68   Pulse 74   Resp 20   Ht 5\' 6"  (1.676 m)   Wt 127 lb (57.6 kg)   SpO2 99%   BMI 20.50 kg/m   Body mass index is 20.5 kg/m.  Advanced Directives 01/28/2017 01/26/2016 08/03/2014 02/01/2014 01/31/2014 11/17/2013  Does Patient Have a Medical Advance Directive? Yes Yes No No Yes No  Type of Paramedic of [place];Living will [place];Living will - - Living will -  Does patient want to make changes to medical advance directive? - - - - No - Patient declined -  Copy of [place] in Chart? No - copy requested No - copy requested - - No - copy requested -  Would patient like information on creating a medical advance directive? - - No - patient declined information No - patient declined information - No - patient declined information    Tobacco Social History   Tobacco Use  Smoking Status Former Smoker  . Packs/day: 2.00  . Years: 10.00  . Pack years: 20.00  . Types: Cigarettes  . Last attempt to quit: 02/13/1967  . Years since quitting: 49.9  Smokeless Tobacco Never Used  Tobacco Comment   smoked 1961-1969 , up to 3 ppd, mainly 2 ppd     Counseling given: Not Answered Comment: smoked 1961-1969 , up to 3 ppd, mainly 2  ppd   Past Medical History:  Diagnosis Date  . Allergic rhinitis, cause unspecified   . Carotid artery disease ([place]) 12/13/2014   1-39% stenosis of bilateral CCAs.   Repeat ultrasound 11/2016.  Marland Kitchen Cervical spine fracture ([place]) 02/01/2014  . Chronic insomnia   . Chronic interstitial cystitis   . Closed fracture of rib(s), unspecified   . Disorder of bone and cartilage, unspecified   . Esophageal reflux   . 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 OSA (obstructive sleep apnea)   . Osteoarthrosis, unspecified whether generalized or localized, unspecified site   . Other and unspecified hyperlipidemia   . Personal history of colonic adenomas 11/28/2012  . Personal history of other disorders of nervous system and sense organs   . SAH (subarachnoid hemorrhage) ([place]) 02/02/2014  . Unspecified closed fracture of pelvis    x 2 2004/2006  . Unspecified essential hypertension    Past Surgical History:  Procedure Laterality Date  . ANKLE FUSION Left   . BACK SURGERY     ; has  had 8 back surgeries  . CERVICAL FUSION     C4-7  . COLONOSCOPY    . CYSTOSCOPY    . HERNIA REPAIR     umbilical  . LUMBAR FUSION     L2-3  . SACRAL FUSION  07/2012  . TIBIA FRACTURE SURGERY Right   .  TONSILLECTOMY    . TOTAL HIP ARTHROPLASTY Right 2012  . TUBAL LIGATION    . UPPER GASTROINTESTINAL ENDOSCOPY     Family History  Problem Relation Age of Onset  . Dementia Mother   . Hypertension Father   . Heart attack Father   . Allergic rhinitis Father   . Cancer Maternal Aunt        breast cancer  . Alzheimer's disease Maternal Aunt   . Stroke Paternal Uncle   . Cancer Cousin        breast cancer  . Colon cancer Neg Hx   . Angioedema Neg Hx   . Asthma Neg Hx   . Eczema Neg Hx    Social History   Socioeconomic History  . Marital status: Widowed    Spouse name: None  . Number of children: None  . Years of education: None  . Highest education level: None  Social  Needs  . Financial resource strain: Not hard at all  . Food insecurity - worry: Never true  . Food insecurity - inability: Never true  . Transportation needs - medical: No  . Transportation needs - non-medical: No  Occupational History  . 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 Smoking status: Former Smoker    Packs/day: 2.00    Years: 10.00    Pack years: 20.00    Types: Cigarettes    Last attempt to quit: 02/13/1967    Years since quitting: 49.9  . Smokeless tobacco: Never Used  . Tobacco comment: smoked 1961-1969 , up to 3 ppd, mainly 2 ppd  Substance and Sexual Activity  . Alcohol use: No  . Drug use: No  . Sexual activity: No  Other Topics Concern  . None  Social History Narrative  . None    Outpatient Encounter Medications as of 01/28/2017  Medication Sig  . atorvastatin (LIPITOR) 40 MG tablet TAKE 1 TABLET ONCE DAILY.  Marland Kitchen Bacillus Coagulans-Inulin (PROBIOTIC-PREBIOTIC PO) Take 1 capsule by mouth daily.  . Calcium Carbonate-Vitamin D (CALCIUM + D PO) Take by mouth daily.  . celecoxib (CELEBREX) 200 MG capsule TAKE 1 CAP ONCE DAILY AS NEEDED--AVOID DAILY USE DUE TO GI AND CARDIAC POTENTIAL ADVERSE EFFECTS  . denosumab (PROLIA) 60 MG/ML SOLN injection Inject 60 mg into the skin every 6 (six) months. Administer in upper arm, thigh, or abdomen  . escitalopram (LEXAPRO) 20 MG tablet TAKE 1 TABLET ONCE DAILY.  . ferrous sulfate 325 (65 FE) MG tablet Take 325 mg by mouth daily with breakfast.    . fexofenadine (ALLEGRA) 180 MG tablet Take 180 mg by mouth daily.    . fluticasone (FLONASE) 50 MCG/ACT nasal spray Use 1 spray each nostril 1-2 times daily as needed  . LORazepam (ATIVAN) 1 MG tablet TAKE 1-1&1/2 TABS AT BEDTIME IF NEEDED FOR SLEEP.  Marland Kitchen methocarbamol (ROBAXIN) 500 MG tablet TAKE 1 TABLET THREE TIMES DAILY AS NEEDED FOR SPASM.  Marland Kitchen Misc Natural Products (OSTEO BI-FLEX TRIPLE STRENGTH PO) Take 1 tablet by mouth 2 (two)  times daily.   . Multiple Vitamin (MULTIVITAMIN PO) Take 1 tablet by mouth daily.   . Probiotic Product (ALIGN) 4 MG CAPS Take 4 mg by mouth daily.   . ranitidine (ZANTAC) 150 MG tablet TAKE 1 TABLET BY MOUTH TWICE DAILY.  . traMADol-acetaminophen (ULTRACET) 37.5-325 MG tablet TAKE 1 OR 2 TABLETS EVERY 6 HOURS AS NEEDED.  Marland Kitchen zolpidem (AMBIEN) 5 MG  tablet TAKE 1 TABLET AT BEDTIME AS NEEDED.  . [DISCONTINUED] EPINEPHrine (EPIPEN 2-PAK) 0.3 mg/0.3 mL IJ SOAJ injection Inject 0.3 mg into the muscle daily as needed. Allergic reaction  . [DISCONTINUED] methocarbamol (ROBAXIN) 500 MG tablet TAKE 1 TABLET THREE TIMES DAILY AS NEEDED FOR SPASM. (Patient not taking: Reported on 01/28/2017)   No facility-administered encounter medications on file as of 01/28/2017.     Activities of Daily Living In your present state of health, do you have any difficulty performing the following activities: 01/28/2017  Hearing? N  Vision? N  Difficulty concentrating or making decisions? N  Walking or climbing stairs? Y  Dressing or bathing? N  Doing errands, shopping? Y  Preparing Food and eating ? N  Using the Toilet? N  In the past six months, have you accidently leaked urine? N  Do you have problems with loss of bowel control? N  Managing your Medications? N  Managing your Finances? N  Housekeeping or managing your Housekeeping? Y  Some recent data might be hidden    Patient Care Team: Hoyt Koch, MD as PCP - General (Internal Medicine) Deneise Lever, MD as Consulting Physician (Pulmonary Disease) Neldon Mc Donnamarie Poag, MD as Consulting Physician (Allergy and Immunology) Skeet Latch, MD as Attending Physician (Cardiology) Jenne Campus, MD as Referring Physician (Neurosurgery) Iven Finn, DMD (Dentistry)    Assessment:   This is a routine wellness examination for [place]. Physical assessment deferred to PCP.   Exercise Activities and Dietary recommendations Current Exercise Habits:  The patient does not participate in regular exercise at present, Exercise limited by: orthopedic condition(s)  Diet (meal preparation, eat out, water intake, caffeinated beverages, dairy products, fruits and vegetables): in general, a "healthy" diet  , well balanced, eats a variety of fruits and vegetables daily, limits salt, fat/cholesterol, sugar, caffeine, drinks 6-8 glasses of water daily.  Goals    . Exercise 3x per week (30 min per time)     Increase exercise in pool.    . Patient Stated     Maintain current weight, add exercise to my daily life. Enjoy life and family.       Fall Risk Fall Risk  01/28/2017 01/26/2016 01/20/2016 12/23/2015 12/16/2014  Falls in the past year? No Yes Yes Yes Yes  Comment - - Emmi Telephone Survey: data to providers prior to load - -  Number falls in past yr: - 2 or more 2 or more 2 or more 1  Comment - Both in same day d/t low blood pressure.  Emmi Telephone Survey Actual Response = 10 - -  Injury with Fall? - No No Yes Yes  Risk for fall due to : - Medication side effect - - -  Follow up - Falls prevention discussed - - -    Depression Screen PHQ 2/9 Scores 01/28/2017 01/26/2016 12/23/2015 12/16/2014  PHQ - 2 Score 1 0 0 0  PHQ- 9 Score 1 - - -     Cognitive Function MMSE - Mini Mental State Exam 01/28/2017  Orientation to time 5  Orientation to Place 5  Registration 3  Attention/ Calculation 5  Recall 1  Language- name 2 objects 2  Language- repeat 1  Language- follow 3 step command 3  Language- read & follow direction 1  Write a sentence 1  Copy design 1  Total score 28        Immunization History  Administered Date(s) Administered  . Influenza Split 10/29/2011, 10/13/2016  . Influenza Whole 11/26/2006,  11/14/2007, 11/12/2009  . Influenza, High Dose Seasonal PF 11/19/2012  . Influenza,inj,Quad PF,6+ Mos 11/26/2014, 12/16/2014, 11/07/2015  . Pneumococcal Conjugate-13 11/07/2014  . Pneumococcal Polysaccharide-23 01/18/2012  .  Td 02/25/2004  . Tdap 12/16/2014  . Zoster 10/18/2005    Screening Tests Health Maintenance  Topic Date Due  . COLONOSCOPY  11/24/2017  . TETANUS/TDAP  12/15/2024  . INFLUENZA VACCINE  Completed  . DEXA SCAN  Completed  . PNA vac Low Risk Adult  Completed      Plan:    Continue doing brain stimulating activities (puzzles, reading, adult coloring books, staying active) to keep memory sharp.   Continue to eat heart healthy diet (full of fruits, vegetables, whole grains, lean protein, water--limit salt, fat, and sugar intake) and increase physical activity as tolerated.  I have personally reviewed and noted the following in the patient's chart:   . Medical and social history . Use of alcohol, tobacco or illicit drugs  . Current medications and supplements . Functional ability and status . Nutritional status . Physical activity . Advanced directives . List of other physicians . Vitals . Screenings to include cognitive, depression, and falls . Referrals and appointments  In addition, I have reviewed and discussed with patient certain preventive protocols, quality metrics, and best practice recommendations. A written personalized care plan for preventive services as well as general preventive health recommendations were provided to patient.     Michiel Cowboy, RN  01/28/2017

## 2017-01-27 ENCOUNTER — Other Ambulatory Visit: Payer: Self-pay | Admitting: Internal Medicine

## 2017-01-28 ENCOUNTER — Ambulatory Visit (INDEPENDENT_AMBULATORY_CARE_PROVIDER_SITE_OTHER): Payer: Medicare Other | Admitting: *Deleted

## 2017-01-28 VITALS — BP 108/68 | HR 74 | Resp 20 | Ht 66.0 in | Wt 127.0 lb

## 2017-01-28 DIAGNOSIS — Z0000 Encounter for general adult medical examination without abnormal findings: Secondary | ICD-10-CM

## 2017-01-28 NOTE — Patient Instructions (Signed)
Continue doing brain stimulating activities (puzzles, reading, adult coloring books, staying active) to keep memory sharp.   Continue to eat heart healthy diet (full of fruits, vegetables, whole grains, lean protein, water--limit salt, fat, and sugar intake) and increase physical activity as tolerated.   Hailey [name] , Thank you for taking time to come for your Medicare Wellness Visit. I appreciate your ongoing commitment to your health goals. Please review the following plan we discussed and let me know if I can assist you in the future.   These are the goals we discussed: Goals    . Exercise 3x per week (30 min per time)     Increase exercise in pool.    . Patient Stated     Maintain current weight, add exercise to my daily life. Enjoy life, and family.       This is a list of the screening recommended for you and due dates:  Health Maintenance  Topic Date Due  . Colon Cancer Screening  11/24/2017  . Tetanus Vaccine  12/15/2024  . Flu Shot  Completed  . DEXA scan (bone density measurement)  Completed  . Pneumonia vaccines  Completed

## 2017-01-31 NOTE — Progress Notes (Signed)
Patient ID: ELICIA LUI, female   DOB: 12/07/1940, 76 y.o.   MRN: [id number] Medical screening examination/treatment/procedure(s) were performed by non-physician practitioner and as supervising physician I was immediately available for consultation/collaboration. I agree with above. Hoyt Koch, MD

## 2017-02-07 ENCOUNTER — Other Ambulatory Visit: Payer: Self-pay | Admitting: Internal Medicine

## 2017-02-15 ENCOUNTER — Other Ambulatory Visit: Payer: Self-pay | Admitting: Internal Medicine

## 2017-02-28 ENCOUNTER — Other Ambulatory Visit (INDEPENDENT_AMBULATORY_CARE_PROVIDER_SITE_OTHER): Payer: Medicare Other

## 2017-02-28 ENCOUNTER — Encounter: Payer: Self-pay | Admitting: Internal Medicine

## 2017-02-28 ENCOUNTER — Ambulatory Visit (INDEPENDENT_AMBULATORY_CARE_PROVIDER_SITE_OTHER): Payer: Medicare Other | Admitting: Internal Medicine

## 2017-02-28 VITALS — BP 112/80 | HR 75 | Temp 98.1°F | Ht 66.0 in | Wt 126.0 lb

## 2017-02-28 DIAGNOSIS — E785 Hyperlipidemia, unspecified: Secondary | ICD-10-CM

## 2017-02-28 DIAGNOSIS — M159 Polyosteoarthritis, unspecified: Secondary | ICD-10-CM

## 2017-02-28 DIAGNOSIS — G894 Chronic pain syndrome: Secondary | ICD-10-CM | POA: Diagnosis not present

## 2017-02-28 DIAGNOSIS — M150 Primary generalized (osteo)arthritis: Secondary | ICD-10-CM

## 2017-02-28 DIAGNOSIS — E2839 Other primary ovarian failure: Secondary | ICD-10-CM | POA: Diagnosis not present

## 2017-02-28 DIAGNOSIS — M8949 Other hypertrophic osteoarthropathy, multiple sites: Secondary | ICD-10-CM

## 2017-02-28 LAB — LIPID PANEL
CHOL/HDL RATIO: 3
Cholesterol: 175 mg/dL (ref 0–200)
HDL: 58.2 mg/dL (ref >39.00)
LDL CALC: 102 mg/dL — AB (ref 0–99)
NONHDL: 116.4
TRIGLYCERIDES: 73 mg/dL (ref 0.0–149.0)
VLDL: 14.6 mg/dL (ref 0.0–40.0)

## 2017-02-28 LAB — CBC
HCT: 40 % (ref 36.0–46.0)
HEMOGLOBIN: 13.2 g/dL (ref 12.0–15.0)
MCHC: 33.1 g/dL (ref 30.0–36.0)
MCV: 92 fl (ref 78.0–100.0)
PLATELETS: 195 10*3/uL (ref 150.0–400.0)
RBC: 4.34 Mil/uL (ref 3.87–5.11)
RDW: 13.4 % (ref 11.5–15.5)
WBC: 4.1 10*3/uL (ref 4.0–10.5)

## 2017-02-28 LAB — COMPREHENSIVE METABOLIC PANEL
ALT: 13 U/L (ref 0–35)
AST: 21 U/L (ref 0–37)
Albumin: 4.7 g/dL (ref 3.5–5.2)
Alkaline Phosphatase: 34 U/L — ABNORMAL LOW (ref 39–117)
BILIRUBIN TOTAL: 0.4 mg/dL (ref 0.2–1.2)
BUN: 23 mg/dL (ref 6–23)
CALCIUM: 9.8 mg/dL (ref 8.4–10.5)
CO2: 30 meq/L (ref 19–32)
Chloride: 99 mEq/L (ref 96–112)
Creatinine, Ser: 0.75 mg/dL (ref 0.40–1.20)
GFR: 79.77 mL/min (ref >60.00)
GLUCOSE: 84 mg/dL (ref 70–99)
POTASSIUM: 4.3 meq/L (ref 3.5–5.1)
Sodium: 137 mEq/L (ref 135–145)
Total Protein: 6.9 g/dL (ref 6.0–8.3)

## 2017-02-28 MED ORDER — ZOSTER VAC RECOMB ADJUVANTED 50 MCG/0.5ML IM SUSR: 0.5000 mL | Freq: Once | INTRAMUSCULAR | 1 refills | Status: AC

## 2017-02-28 NOTE — Patient Instructions (Addendum)
We will check the labs today and send them to the cardiologist.   We have ordered the ultrasound of the breasts.   We have sent in the refills.  We have given you the shingles vaccine prescription.   You can turmeric.

## 2017-03-01 MED ORDER — ZOLPIDEM TARTRATE 5 MG PO TABS: 5.0000 mg | ORAL_TABLET | Freq: Every evening | ORAL | 5 refills | Status: DC | PRN

## 2017-03-01 MED ORDER — LORAZEPAM 1 MG PO TABS: 1.0000 mg | ORAL_TABLET | Freq: Every day | ORAL | 2 refills | Status: DC

## 2017-03-01 MED ORDER — ESCITALOPRAM OXALATE 20 MG PO TABS: 20.0000 mg | ORAL_TABLET | Freq: Every day | ORAL | 3 refills | Status: DC

## 2017-03-01 MED ORDER — TRAMADOL-ACETAMINOPHEN 37.5-325 MG PO TABS: ORAL_TABLET | ORAL | 2 refills | Status: DC

## 2017-03-01 MED ORDER — ATORVASTATIN CALCIUM 40 MG PO TABS: 40.0000 mg | ORAL_TABLET | Freq: Every day | ORAL | 3 refills | Status: DC

## 2017-03-01 MED ORDER — METHOCARBAMOL 500 MG PO TABS: ORAL_TABLET | ORAL | 5 refills | Status: DC

## 2017-03-01 MED ORDER — CELECOXIB 200 MG PO CAPS: 200.0000 mg | ORAL_CAPSULE | Freq: Every day | ORAL | 3 refills | Status: DC

## 2017-03-01 MED ORDER — RANITIDINE HCL 150 MG PO TABS: 150.0000 mg | ORAL_TABLET | Freq: Two times a day (BID) | ORAL | 3 refills | Status: DC

## 2017-03-01 NOTE — Assessment & Plan Note (Signed)
Referral to pain management done today. Can refill her tramadol until she is able to get in there. She is also taking celebrex for pain which does not help. She is encouraged to try turmeric to see if this adds benefit.

## 2017-03-01 NOTE — Progress Notes (Signed)
   Subjective:    Patient ID: Hailey [name], female    DOB: 1940/12/24, 77 y.o.   MRN: [id number]  HPI The patient is a 77 YO female coming in for follow up of her chronic pain with arthritis. She is taking tramadol 200 pills per month. This used to do very well for her pain but has not been helping much lately. She went back to her pain medicine doctor and he would not prescribe her anything else. She is upset about this and wants to go to another place. She is not able to do some adls due to pain. Has not had fall or injury. The pain has just gradually worsened. Rates pain 8/10 most of the time even with her medication.   Review of Systems  Constitutional: Positive for activity change. Negative for appetite change, chills, fatigue, fever and unexpected weight change.  Respiratory: Negative.   Cardiovascular: Negative.   Gastrointestinal: Negative.   Musculoskeletal: Positive for arthralgias, back pain and myalgias. Negative for gait problem and joint swelling.  Skin: Negative.   Neurological: Negative.       Objective:   Physical Exam  Constitutional: She is oriented to person, place, and time. She appears well-developed and well-nourished.  HENT:  Head: Normocephalic and atraumatic.  Eyes: EOM are normal.  Neck: Normal range of motion.  Cardiovascular: Normal rate and regular rhythm.  Pulmonary/Chest: Effort normal and breath sounds normal. No respiratory distress. She has no wheezes. She has no rales.  Abdominal: Soft.  Musculoskeletal: She exhibits tenderness. She exhibits no edema.  Neurological: She is alert and oriented to person, place, and time. Coordination normal.  Skin: Skin is warm and dry.   Vitals:   02/28/17 1518  BP: 112/80  Pulse: 75  Temp: 98.1 F (36.7 C)  TempSrc: Oral  SpO2: 98%  Weight: 126 lb (57.2 kg)  Height: 5\' 6"  (1.676 m)      Assessment & Plan:

## 2017-03-11 ENCOUNTER — Encounter: Payer: Self-pay | Admitting: Internal Medicine

## 2017-03-11 DIAGNOSIS — R921 Mammographic calcification found on diagnostic imaging of breast: Secondary | ICD-10-CM | POA: Diagnosis not present

## 2017-03-11 DIAGNOSIS — Z803 Family history of malignant neoplasm of breast: Secondary | ICD-10-CM | POA: Diagnosis not present

## 2017-03-11 LAB — HM MAMMOGRAPHY

## 2017-03-13 ENCOUNTER — Encounter: Payer: Self-pay | Admitting: Internal Medicine

## 2017-03-13 NOTE — Progress Notes (Signed)
Abstracted and sent to scan  

## 2017-04-18 ENCOUNTER — Encounter: Payer: Self-pay | Admitting: Internal Medicine

## 2017-05-23 DIAGNOSIS — Z803 Family history of malignant neoplasm of breast: Secondary | ICD-10-CM | POA: Diagnosis not present

## 2017-05-28 ENCOUNTER — Other Ambulatory Visit: Payer: Self-pay | Admitting: Radiology

## 2017-05-28 DIAGNOSIS — Z803 Family history of malignant neoplasm of breast: Secondary | ICD-10-CM

## 2017-06-03 ENCOUNTER — Ambulatory Visit
Admission: RE | Admit: 2017-06-03 | Discharge: 2017-06-03 | Disposition: A | Discharge status: Home / Self Care | Payer: Medicare Other | Source: Ambulatory Visit | Attending: Radiology | Admitting: Radiology

## 2017-06-03 DIAGNOSIS — Z803 Family history of malignant neoplasm of breast: Secondary | ICD-10-CM | POA: Diagnosis not present

## 2017-06-03 DIAGNOSIS — N6320 Unspecified lump in the left breast, unspecified quadrant: Secondary | ICD-10-CM | POA: Diagnosis not present

## 2017-06-03 DIAGNOSIS — N6310 Unspecified lump in the right breast, unspecified quadrant: Secondary | ICD-10-CM | POA: Diagnosis not present

## 2017-06-03 MED ORDER — GADOBENATE DIMEGLUMINE 529 MG/ML IV SOLN
10.0000 mL | Freq: Once | INTRAVENOUS | Status: AC | PRN
  Administered 2017-06-03: 10 mL via INTRAVENOUS

## 2017-08-12 DIAGNOSIS — H04123 Dry eye syndrome of bilateral lacrimal glands: Secondary | ICD-10-CM | POA: Diagnosis not present

## 2017-08-12 DIAGNOSIS — H2513 Age-related nuclear cataract, bilateral: Secondary | ICD-10-CM | POA: Diagnosis not present

## 2017-08-12 DIAGNOSIS — H353131 Nonexudative age-related macular degeneration, bilateral, early dry stage: Secondary | ICD-10-CM | POA: Diagnosis not present

## 2017-08-16 ENCOUNTER — Other Ambulatory Visit: Payer: Self-pay | Admitting: Internal Medicine

## 2017-08-16 NOTE — Telephone Encounter (Signed)
Control database checked last refill: Ativan & Ambien: 07/18/2017 Tramadol: 05/02/2017  LOV: 02/28/2017

## 2017-08-16 NOTE — Telephone Encounter (Signed)
Cannot refill tramadol. She should have been in with pain management by now (referred in January).

## 2017-08-29 ENCOUNTER — Ambulatory Visit: Payer: Self-pay | Admitting: Internal Medicine

## 2017-08-30 ENCOUNTER — Ambulatory Visit (INDEPENDENT_AMBULATORY_CARE_PROVIDER_SITE_OTHER): Payer: Medicare Other | Admitting: Internal Medicine

## 2017-08-30 ENCOUNTER — Encounter: Payer: Self-pay | Admitting: Internal Medicine

## 2017-08-30 DIAGNOSIS — G894 Chronic pain syndrome: Secondary | ICD-10-CM

## 2017-08-30 MED ORDER — TRAMADOL-ACETAMINOPHEN 37.5-325 MG PO TABS: ORAL_TABLET | ORAL | 2 refills | Status: DC

## 2017-08-30 NOTE — Progress Notes (Signed)
   Subjective:    Patient ID: Hailey [name], female    DOB: 1940/09/09, 77 y.o.   MRN: [id number]  HPI The patient is a 77 YO female coming in for refill on tramadol. She was previously with pain medicine and they were unwilling to work with her. She wanted to see someone else and we referred her back in January. She is still working on getting in with them. She is needing refill until she can get in with them. She has been swimming more which has helped with her pain. She has also been trying hemp pills to see if these help and the combination with a smaller amount of tramadol has been helpful. Denies injury or overuse.   Review of Systems  Constitutional: Negative.   HENT: Negative.   Eyes: Negative.   Respiratory: Negative for cough, chest tightness and shortness of breath.   Cardiovascular: Negative for chest pain, palpitations and leg swelling.  Gastrointestinal: Negative for abdominal distention, abdominal pain, constipation, diarrhea, nausea and vomiting.  Musculoskeletal: Positive for arthralgias, back pain and myalgias.  Skin: Negative.   Neurological: Negative.   Psychiatric/Behavioral: Negative.       Objective:   Physical Exam  Constitutional: She is oriented to person, place, and time. She appears well-developed and well-nourished.  HENT:  Head: Normocephalic and atraumatic.  Eyes: EOM are normal.  Neck: Normal range of motion.  Cardiovascular: Normal rate and regular rhythm.  Pulmonary/Chest: Effort normal and breath sounds normal. No respiratory distress. She has no wheezes. She has no rales.  Abdominal: Soft. Bowel sounds are normal. She exhibits no distension. There is no tenderness. There is no rebound.  Musculoskeletal: She exhibits tenderness. She exhibits no edema.  Neurological: She is alert and oriented to person, place, and time. Coordination normal.  Skin: Skin is warm and dry.  Psychiatric: She has a normal mood and affect.   Vitals:   08/30/17 1555  BP:  110/60  Pulse: 76  Temp: 98 F (36.7 C)  TempSrc: Oral  SpO2: 97%  Weight: 125 lb (56.7 kg)  Height: 5\' 6"  (1.676 m)      Assessment & Plan:

## 2017-08-30 NOTE — Patient Instructions (Signed)
We have sent in the tramadol for you.

## 2017-08-30 NOTE — Assessment & Plan Note (Addendum)
Refill tramadol/apap today until she can get in with pain medicine. She has dramatically cut usage which is great. She is doing hemp and swimming to help with chronic pain management. She is aware of the long term risk of dependence, memory change, increased risk of falls. [facility] narcotic database reviewed and no inappropriate fills since last visit.

## 2017-09-14 ENCOUNTER — Other Ambulatory Visit: Payer: Self-pay | Admitting: Internal Medicine

## 2017-09-16 DIAGNOSIS — M79602 Pain in left arm: Secondary | ICD-10-CM | POA: Diagnosis not present

## 2017-09-16 DIAGNOSIS — Z79899 Other long term (current) drug therapy: Secondary | ICD-10-CM | POA: Diagnosis not present

## 2017-09-16 DIAGNOSIS — Z79891 Long term (current) use of opiate analgesic: Secondary | ICD-10-CM | POA: Diagnosis not present

## 2017-09-16 DIAGNOSIS — G894 Chronic pain syndrome: Secondary | ICD-10-CM | POA: Diagnosis not present

## 2017-09-16 DIAGNOSIS — M25519 Pain in unspecified shoulder: Secondary | ICD-10-CM | POA: Diagnosis not present

## 2017-09-16 DIAGNOSIS — M545 Low back pain: Secondary | ICD-10-CM | POA: Diagnosis not present

## 2017-09-16 NOTE — Telephone Encounter (Signed)
Please advise per Dr. Nathanial Millman absence. Thank you  Control database checked last refill: 08/16/2017 LOV: 08/30/2017

## 2017-09-16 NOTE — Telephone Encounter (Signed)
Done erx 

## 2017-10-04 DIAGNOSIS — R5383 Other fatigue: Secondary | ICD-10-CM | POA: Diagnosis not present

## 2017-10-04 DIAGNOSIS — M19012 Primary osteoarthritis, left shoulder: Secondary | ICD-10-CM | POA: Diagnosis not present

## 2017-10-04 DIAGNOSIS — E559 Vitamin D deficiency, unspecified: Secondary | ICD-10-CM | POA: Diagnosis not present

## 2017-10-04 DIAGNOSIS — M810 Age-related osteoporosis without current pathological fracture: Secondary | ICD-10-CM | POA: Diagnosis not present

## 2017-10-16 ENCOUNTER — Other Ambulatory Visit: Payer: Self-pay | Admitting: Sports Medicine

## 2017-10-16 DIAGNOSIS — M810 Age-related osteoporosis without current pathological fracture: Secondary | ICD-10-CM | POA: Diagnosis not present

## 2017-10-16 DIAGNOSIS — G8929 Other chronic pain: Secondary | ICD-10-CM

## 2017-10-16 DIAGNOSIS — M25512 Pain in left shoulder: Principal | ICD-10-CM

## 2017-10-16 DIAGNOSIS — M19012 Primary osteoarthritis, left shoulder: Secondary | ICD-10-CM | POA: Diagnosis not present

## 2017-10-19 ENCOUNTER — Ambulatory Visit
Admission: RE | Admit: 2017-10-19 | Discharge: 2017-10-19 | Disposition: A | Discharge status: Home / Self Care | Payer: Medicare Other | Source: Ambulatory Visit | Attending: Sports Medicine | Admitting: Sports Medicine

## 2017-10-19 DIAGNOSIS — G8929 Other chronic pain: Secondary | ICD-10-CM

## 2017-10-19 DIAGNOSIS — M25512 Pain in left shoulder: Principal | ICD-10-CM

## 2017-10-19 DIAGNOSIS — M19012 Primary osteoarthritis, left shoulder: Secondary | ICD-10-CM | POA: Diagnosis not present

## 2017-10-26 ENCOUNTER — Other Ambulatory Visit: Payer: Self-pay

## 2017-10-28 DIAGNOSIS — G894 Chronic pain syndrome: Secondary | ICD-10-CM | POA: Diagnosis not present

## 2017-10-28 DIAGNOSIS — M19012 Primary osteoarthritis, left shoulder: Secondary | ICD-10-CM | POA: Diagnosis not present

## 2017-10-30 DIAGNOSIS — Z23 Encounter for immunization: Secondary | ICD-10-CM | POA: Diagnosis not present

## 2017-11-06 DIAGNOSIS — M19012 Primary osteoarthritis, left shoulder: Secondary | ICD-10-CM | POA: Diagnosis not present

## 2017-11-06 DIAGNOSIS — M25512 Pain in left shoulder: Secondary | ICD-10-CM | POA: Diagnosis not present

## 2017-11-29 NOTE — Patient Instructions (Signed)
KESTREL MIS  11/29/2017   Your procedure is scheduled on: 12-10-17   Report to [facility] Main  Entrance    Report to admitting at 5:30AM    Call this number if you have problems the morning of surgery [phone]     Remember: Do not eat food or drink liquids :After Midnight. BRUSH YOUR TEETH MORNING OF SURGERY AND RINSE YOUR MOUTH OUT, NO CHEWING GUM CANDY OR MINTS.     Take these medicines the morning of surgery with A SIP OF WATER: atorvastatin, escitalopram, allegra, nasal spray if needed                                 You may not have any metal on your body including hair pins and              piercings  Do not wear jewelry, make-up, lotions, powders or perfumes, deodorant             Do not wear nail polish.  Do not shave  48 hours prior to surgery.     Do not bring valuables to the hospital. [place].  Contacts, dentures or bridgework may not be worn into surgery.  Leave suitcase in the car. After surgery it may be brought to your room.                  Please read over the following fact sheets you were given: _____________________________________________________________________             [facility] - Preparing for Surgery Before surgery, you can play an important role.  Because skin is not sterile, your skin needs to be as free of germs as possible.  You can reduce the number of germs on your skin by washing with CHG (chlorahexidine gluconate) soap before surgery.  CHG is an antiseptic cleaner which kills germs and bonds with the skin to continue killing germs even after washing. Please DO NOT use if you have an allergy to CHG or antibacterial soaps.  If your skin becomes reddened/irritated stop using the CHG and inform your nurse when you arrive at Short Stay. Do not shave (including legs and underarms) for at least 48 hours prior to the first CHG shower.  You may shave your  face/neck. Please follow these instructions carefully:  1.  Shower with CHG Soap the night before surgery and the  morning of Surgery.  2.  If you choose to wash your hair, wash your hair first as usual with your  normal  shampoo.  3.  After you shampoo, rinse your hair and body thoroughly to remove the  shampoo.                           4.  Use CHG as you would any other liquid soap.  You can apply chg directly  to the skin and wash                       Gently with a scrungie or clean washcloth.  5.  Apply the CHG Soap to your body ONLY FROM THE NECK DOWN.   Do not use on  face/ open                           Wound or open sores. Avoid contact with eyes, ears mouth and genitals (private parts).                       Wash face,  Genitals (private parts) with your normal soap.             6.  Wash thoroughly, paying special attention to the area where your surgery  will be performed.  7.  Thoroughly rinse your body with warm water from the neck down.  8.  DO NOT shower/wash with your normal soap after using and rinsing off  the CHG Soap.                9.  Pat yourself dry with a clean towel.            10.  Wear clean pajamas.            11.  Place clean sheets on your bed the night of your first shower and do not  sleep with pets. Day of Surgery : Do not apply any lotions/deodorants the morning of surgery.  Please wear clean clothes to the hospital/surgery center.  FAILURE TO FOLLOW THESE INSTRUCTIONS MAY RESULT IN THE CANCELLATION OF YOUR SURGERY PATIENT SIGNATURE_________________________________  NURSE SIGNATURE__________________________________  ________________________________________________________________________

## 2017-11-29 NOTE — Progress Notes (Signed)
EKG 12-13-16 epic

## 2017-12-02 DIAGNOSIS — G894 Chronic pain syndrome: Secondary | ICD-10-CM | POA: Diagnosis not present

## 2017-12-02 DIAGNOSIS — M79602 Pain in left arm: Secondary | ICD-10-CM | POA: Diagnosis not present

## 2017-12-02 DIAGNOSIS — M961 Postlaminectomy syndrome, not elsewhere classified: Secondary | ICD-10-CM | POA: Diagnosis not present

## 2017-12-02 DIAGNOSIS — M19012 Primary osteoarthritis, left shoulder: Secondary | ICD-10-CM | POA: Diagnosis not present

## 2017-12-03 ENCOUNTER — Other Ambulatory Visit (HOSPITAL_COMMUNITY): Payer: Self-pay

## 2017-12-03 ENCOUNTER — Other Ambulatory Visit: Payer: Self-pay

## 2017-12-03 ENCOUNTER — Encounter (HOSPITAL_COMMUNITY)
Admission: RE | Admit: 2017-12-03 | Discharge: 2017-12-03 | Disposition: A | Discharge status: Home / Self Care | Payer: Medicare Other | Source: Ambulatory Visit | Attending: Orthopedic Surgery | Admitting: Orthopedic Surgery

## 2017-12-03 ENCOUNTER — Encounter (HOSPITAL_COMMUNITY): Payer: Self-pay

## 2017-12-03 DIAGNOSIS — M25512 Pain in left shoulder: Secondary | ICD-10-CM | POA: Diagnosis not present

## 2017-12-03 DIAGNOSIS — G8929 Other chronic pain: Secondary | ICD-10-CM | POA: Diagnosis not present

## 2017-12-03 DIAGNOSIS — Z01818 Encounter for other preprocedural examination: Secondary | ICD-10-CM | POA: Diagnosis not present

## 2017-12-03 LAB — BASIC METABOLIC PANEL
ANION GAP: 7 (ref 5–15)
BUN: 22 mg/dL (ref 8–23)
CO2: 28 mmol/L (ref 22–32)
Calcium: 9.5 mg/dL (ref 8.9–10.3)
Chloride: 101 mmol/L (ref 98–111)
Creatinine, Ser: 0.74 mg/dL (ref 0.44–1.00)
GFR calc Af Amer: >60 mL/min (ref >60)
GFR calc non Af Amer: >60 mL/min (ref >60)
GLUCOSE: 67 mg/dL — AB (ref 70–99)
POTASSIUM: 4.3 mmol/L (ref 3.5–5.1)
Sodium: 136 mmol/L (ref 135–145)

## 2017-12-03 LAB — CBC
HCT: 42.3 % (ref 36.0–46.0)
Hemoglobin: 13.5 g/dL (ref 12.0–15.0)
MCH: 29.9 pg (ref 26.0–34.0)
MCHC: 31.9 g/dL (ref 30.0–36.0)
MCV: 93.8 fL (ref 80.0–100.0)
Platelets: 200 10*3/uL (ref 150–400)
RBC: 4.51 MIL/uL (ref 3.87–5.11)
RDW: 13.6 % (ref 11.5–15.5)
WBC: 4.8 10*3/uL (ref 4.0–10.5)
nRBC: 0 % (ref 0.0–0.2)

## 2017-12-03 LAB — SURGICAL PCR SCREEN
MRSA, PCR: NEGATIVE
Staphylococcus aureus: NEGATIVE

## 2017-12-06 ENCOUNTER — Other Ambulatory Visit: Payer: Self-pay | Admitting: Orthopedic Surgery

## 2017-12-10 ENCOUNTER — Other Ambulatory Visit: Payer: Self-pay

## 2017-12-10 ENCOUNTER — Inpatient Hospital Stay (HOSPITAL_COMMUNITY)
Admission: RE | Admit: 2017-12-10 | Discharge: 2017-12-11 | DRG: 483 | Disposition: A | Discharge status: Home / Self Care | Payer: Medicare Other | Attending: Orthopedic Surgery | Admitting: Orthopedic Surgery

## 2017-12-10 ENCOUNTER — Encounter (HOSPITAL_COMMUNITY)
Admission: RE | Disposition: A | Discharge status: Home / Self Care | Payer: Self-pay | Source: Home / Self Care | Attending: Orthopedic Surgery

## 2017-12-10 ENCOUNTER — Inpatient Hospital Stay (HOSPITAL_COMMUNITY): Payer: Medicare Other | Admitting: Certified Registered Nurse Anesthetist

## 2017-12-10 ENCOUNTER — Encounter (HOSPITAL_COMMUNITY): Payer: Self-pay | Admitting: Emergency Medicine

## 2017-12-10 ENCOUNTER — Inpatient Hospital Stay (HOSPITAL_COMMUNITY): Payer: Medicare Other

## 2017-12-10 DIAGNOSIS — I10 Essential (primary) hypertension: Secondary | ICD-10-CM | POA: Diagnosis not present

## 2017-12-10 DIAGNOSIS — E785 Hyperlipidemia, unspecified: Secondary | ICD-10-CM | POA: Diagnosis not present

## 2017-12-10 DIAGNOSIS — Z823 Family history of stroke: Secondary | ICD-10-CM

## 2017-12-10 DIAGNOSIS — F5104 Psychophysiologic insomnia: Secondary | ICD-10-CM | POA: Diagnosis not present

## 2017-12-10 DIAGNOSIS — M19012 Primary osteoarthritis, left shoulder: Principal | ICD-10-CM | POA: Diagnosis present

## 2017-12-10 DIAGNOSIS — Z7951 Long term (current) use of inhaled steroids: Secondary | ICD-10-CM | POA: Diagnosis not present

## 2017-12-10 DIAGNOSIS — Z9851 Tubal ligation status: Secondary | ICD-10-CM

## 2017-12-10 DIAGNOSIS — Z981 Arthrodesis status: Secondary | ICD-10-CM

## 2017-12-10 DIAGNOSIS — N3010 Interstitial cystitis (chronic) without hematuria: Secondary | ICD-10-CM | POA: Diagnosis present

## 2017-12-10 DIAGNOSIS — M12812 Other specific arthropathies, not elsewhere classified, left shoulder: Secondary | ICD-10-CM

## 2017-12-10 DIAGNOSIS — G8918 Other acute postprocedural pain: Secondary | ICD-10-CM | POA: Diagnosis not present

## 2017-12-10 DIAGNOSIS — Z803 Family history of malignant neoplasm of breast: Secondary | ICD-10-CM | POA: Diagnosis not present

## 2017-12-10 DIAGNOSIS — Z820 Family history of epilepsy and other diseases of the nervous system: Secondary | ICD-10-CM

## 2017-12-10 DIAGNOSIS — G4733 Obstructive sleep apnea (adult) (pediatric): Secondary | ICD-10-CM | POA: Diagnosis not present

## 2017-12-10 DIAGNOSIS — Z836 Family history of other diseases of the respiratory system: Secondary | ICD-10-CM | POA: Diagnosis not present

## 2017-12-10 DIAGNOSIS — J309 Allergic rhinitis, unspecified: Secondary | ICD-10-CM | POA: Diagnosis not present

## 2017-12-10 DIAGNOSIS — Z8249 Family history of ischemic heart disease and other diseases of the circulatory system: Secondary | ICD-10-CM

## 2017-12-10 DIAGNOSIS — Z96641 Presence of right artificial hip joint: Secondary | ICD-10-CM | POA: Diagnosis present

## 2017-12-10 DIAGNOSIS — Z818 Family history of other mental and behavioral disorders: Secondary | ICD-10-CM | POA: Diagnosis not present

## 2017-12-10 DIAGNOSIS — Z87891 Personal history of nicotine dependence: Secondary | ICD-10-CM | POA: Diagnosis not present

## 2017-12-10 DIAGNOSIS — Z791 Long term (current) use of non-steroidal anti-inflammatories (NSAID): Secondary | ICD-10-CM

## 2017-12-10 DIAGNOSIS — M75102 Unspecified rotator cuff tear or rupture of left shoulder, not specified as traumatic: Secondary | ICD-10-CM | POA: Diagnosis not present

## 2017-12-10 DIAGNOSIS — Z888 Allergy status to other drugs, medicaments and biological substances status: Secondary | ICD-10-CM

## 2017-12-10 DIAGNOSIS — K219 Gastro-esophageal reflux disease without esophagitis: Secondary | ICD-10-CM | POA: Diagnosis present

## 2017-12-10 DIAGNOSIS — Z7982 Long term (current) use of aspirin: Secondary | ICD-10-CM

## 2017-12-10 DIAGNOSIS — Z9089 Acquired absence of other organs: Secondary | ICD-10-CM

## 2017-12-10 DIAGNOSIS — M25712 Osteophyte, left shoulder: Secondary | ICD-10-CM | POA: Diagnosis present

## 2017-12-10 DIAGNOSIS — Z96619 Presence of unspecified artificial shoulder joint: Secondary | ICD-10-CM

## 2017-12-10 DIAGNOSIS — Z79899 Other long term (current) drug therapy: Secondary | ICD-10-CM

## 2017-12-10 DIAGNOSIS — Z86010 Personal history of colonic polyps: Secondary | ICD-10-CM

## 2017-12-10 DIAGNOSIS — Z96612 Presence of left artificial shoulder joint: Secondary | ICD-10-CM | POA: Diagnosis not present

## 2017-12-10 DIAGNOSIS — Z471 Aftercare following joint replacement surgery: Secondary | ICD-10-CM | POA: Diagnosis not present

## 2017-12-10 HISTORY — DX: Unspecified rotator cuff tear or rupture of left shoulder, not specified as traumatic: M75.102

## 2017-12-10 HISTORY — DX: Other specific arthropathies, not elsewhere classified, left shoulder: M12.812

## 2017-12-10 HISTORY — PX: REVERSE SHOULDER ARTHROPLASTY: SHX5054

## 2017-12-10 SURGERY — ARTHROPLASTY, SHOULDER, TOTAL, REVERSE
Anesthesia: General | Site: Shoulder | Laterality: Left

## 2017-12-10 MED ORDER — SUGAMMADEX SODIUM 500 MG/5ML IV SOLN
INTRAVENOUS | Status: DC | PRN
  Administered 2017-12-10: 200 mg via INTRAVENOUS

## 2017-12-10 MED ORDER — OXYCODONE HCL 5 MG/5ML PO SOLN
5.0000 mg | Freq: Once | ORAL | Status: DC | PRN
  Filled 2017-12-10: qty 5

## 2017-12-10 MED ORDER — LACTATED RINGERS IV SOLN
INTRAVENOUS | Status: DC
  Administered 2017-12-10: 07:00:00 via INTRAVENOUS

## 2017-12-10 MED ORDER — DEXAMETHASONE SODIUM PHOSPHATE 10 MG/ML IJ SOLN
INTRAMUSCULAR | Status: DC | PRN
  Administered 2017-12-10: 10 mg via INTRAVENOUS

## 2017-12-10 MED ORDER — HYDROMORPHONE HCL 1 MG/ML IJ SOLN: 0.5000 mg | INTRAMUSCULAR | Status: DC | PRN

## 2017-12-10 MED ORDER — METOCLOPRAMIDE HCL 5 MG PO TABS: 5.0000 mg | ORAL_TABLET | Freq: Three times a day (TID) | ORAL | Status: DC | PRN

## 2017-12-10 MED ORDER — ONDANSETRON HCL 4 MG PO TABS
4.0000 mg | ORAL_TABLET | Freq: Four times a day (QID) | ORAL | Status: DC | PRN
  Administered 2017-12-10: 4 mg via ORAL
  Filled 2017-12-10: qty 1

## 2017-12-10 MED ORDER — PROMETHAZINE HCL 25 MG/ML IJ SOLN: 6.2500 mg | INTRAMUSCULAR | Status: DC | PRN

## 2017-12-10 MED ORDER — CEFAZOLIN SODIUM-DEXTROSE 2-4 GM/100ML-% IV SOLN
2.0000 g | INTRAVENOUS | Status: AC
  Administered 2017-12-10: 2 g via INTRAVENOUS
  Filled 2017-12-10: qty 100

## 2017-12-10 MED ORDER — DOCUSATE SODIUM 100 MG PO CAPS
100.0000 mg | ORAL_CAPSULE | Freq: Two times a day (BID) | ORAL | Status: DC
  Administered 2017-12-10 – 2017-12-11 (×2): 100 mg via ORAL
  Filled 2017-12-10 (×2): qty 1

## 2017-12-10 MED ORDER — DIPHENHYDRAMINE HCL 25 MG PO CAPS
25.0000 mg | ORAL_CAPSULE | Freq: Every day | ORAL | Status: DC
  Administered 2017-12-10: 25 mg via ORAL
  Filled 2017-12-10: qty 1

## 2017-12-10 MED ORDER — ACETAMINOPHEN 325 MG PO TABS: 325.0000 mg | ORAL_TABLET | Freq: Four times a day (QID) | ORAL | Status: DC | PRN

## 2017-12-10 MED ORDER — BUPIVACAINE HCL (PF) 0.5 % IJ SOLN
INTRAMUSCULAR | Status: DC | PRN
  Administered 2017-12-10: 20 mL via PERINEURAL

## 2017-12-10 MED ORDER — KETOROLAC TROMETHAMINE 15 MG/ML IJ SOLN
7.5000 mg | Freq: Four times a day (QID) | INTRAMUSCULAR | Status: AC
  Administered 2017-12-10 – 2017-12-11 (×4): 7.5 mg via INTRAVENOUS
  Filled 2017-12-10 (×4): qty 1

## 2017-12-10 MED ORDER — PROPOFOL 10 MG/ML IV BOLUS
INTRAVENOUS | Status: DC | PRN
  Administered 2017-12-10: 100 mg via INTRAVENOUS

## 2017-12-10 MED ORDER — ESCITALOPRAM OXALATE 20 MG PO TABS
20.0000 mg | ORAL_TABLET | Freq: Every day | ORAL | Status: DC
  Administered 2017-12-11: 20 mg via ORAL
  Filled 2017-12-10: qty 1

## 2017-12-10 MED ORDER — ONDANSETRON HCL 4 MG PO TABS: 4.0000 mg | ORAL_TABLET | Freq: Three times a day (TID) | ORAL | 0 refills | Status: DC | PRN

## 2017-12-10 MED ORDER — METHOCARBAMOL 500 MG IVPB - SIMPLE MED
500.0000 mg | Freq: Four times a day (QID) | INTRAVENOUS | Status: DC | PRN
  Filled 2017-12-10: qty 50

## 2017-12-10 MED ORDER — CHLORHEXIDINE GLUCONATE 4 % EX LIQD: 60.0000 mL | Freq: Once | CUTANEOUS | Status: DC

## 2017-12-10 MED ORDER — ROCURONIUM BROMIDE 100 MG/10ML IV SOLN
INTRAVENOUS | Status: DC | PRN
  Administered 2017-12-10 (×2): 20 mg via INTRAVENOUS
  Administered 2017-12-10: 10 mg via INTRAVENOUS
  Administered 2017-12-10: 60 mg via INTRAVENOUS

## 2017-12-10 MED ORDER — MIDAZOLAM HCL 5 MG/5ML IJ SOLN
INTRAMUSCULAR | Status: DC | PRN
  Administered 2017-12-10 (×2): 1 mg via INTRAVENOUS

## 2017-12-10 MED ORDER — MIDAZOLAM HCL 2 MG/2ML IJ SOLN
INTRAMUSCULAR | Status: AC
  Filled 2017-12-10: qty 2

## 2017-12-10 MED ORDER — CALCIUM CITRATE-VITAMIN D 315-200 MG-UNIT PO TABS: 2.0000 | ORAL_TABLET | Freq: Every day | ORAL | Status: DC

## 2017-12-10 MED ORDER — ATORVASTATIN CALCIUM 40 MG PO TABS
40.0000 mg | ORAL_TABLET | Freq: Every day | ORAL | Status: DC
  Administered 2017-12-11: 40 mg via ORAL
  Filled 2017-12-10: qty 1

## 2017-12-10 MED ORDER — 0.9 % SODIUM CHLORIDE (POUR BTL) OPTIME
TOPICAL | Status: DC | PRN
  Administered 2017-12-10: 1000 mL

## 2017-12-10 MED ORDER — VITAMIN E 180 MG (400 UNIT) PO CAPS
400.0000 [IU] | ORAL_CAPSULE | Freq: Every day | ORAL | Status: DC
  Administered 2017-12-10 – 2017-12-11 (×2): 400 [IU] via ORAL
  Filled 2017-12-10 (×2): qty 1

## 2017-12-10 MED ORDER — B COMPLEX-C PO TABS
1.0000 | ORAL_TABLET | Freq: Every day | ORAL | Status: DC
  Administered 2017-12-10 – 2017-12-11 (×2): 1 via ORAL
  Filled 2017-12-10 (×2): qty 1

## 2017-12-10 MED ORDER — FENTANYL CITRATE (PF) 250 MCG/5ML IJ SOLN
INTRAMUSCULAR | Status: AC
  Filled 2017-12-10: qty 5

## 2017-12-10 MED ORDER — POTASSIUM CHLORIDE IN NACL 20-0.45 MEQ/L-% IV SOLN
INTRAVENOUS | Status: DC
  Administered 2017-12-10 – 2017-12-11 (×2): via INTRAVENOUS
  Filled 2017-12-10 (×2): qty 1000

## 2017-12-10 MED ORDER — CEFAZOLIN SODIUM-DEXTROSE 1-4 GM/50ML-% IV SOLN
1.0000 g | Freq: Four times a day (QID) | INTRAVENOUS | Status: AC
  Administered 2017-12-10 – 2017-12-11 (×3): 1 g via INTRAVENOUS
  Filled 2017-12-10 (×3): qty 50

## 2017-12-10 MED ORDER — PHENOL 1.4 % MT LIQD
1.0000 | OROMUCOSAL | Status: DC | PRN
  Filled 2017-12-10: qty 177

## 2017-12-10 MED ORDER — FLUTICASONE PROPIONATE 50 MCG/ACT NA SUSP
2.0000 | Freq: Two times a day (BID) | NASAL | Status: DC
  Filled 2017-12-10: qty 16

## 2017-12-10 MED ORDER — FENTANYL CITRATE (PF) 100 MCG/2ML IJ SOLN
INTRAMUSCULAR | Status: DC | PRN
  Administered 2017-12-10 (×5): 50 ug via INTRAVENOUS

## 2017-12-10 MED ORDER — OXYCODONE HCL 5 MG PO TABS
5.0000 mg | ORAL_TABLET | ORAL | Status: DC | PRN
  Administered 2017-12-10 (×2): 5 mg via ORAL
  Administered 2017-12-10 – 2017-12-11 (×3): 10 mg via ORAL
  Filled 2017-12-10 (×2): qty 1
  Filled 2017-12-10 (×3): qty 2

## 2017-12-10 MED ORDER — ASPIRIN EC 81 MG PO TBEC
81.0000 mg | DELAYED_RELEASE_TABLET | Freq: Every day | ORAL | Status: DC
  Administered 2017-12-10 – 2017-12-11 (×2): 81 mg via ORAL
  Filled 2017-12-10 (×2): qty 1

## 2017-12-10 MED ORDER — CO Q 10 100 MG PO CAPS: 200.0000 mg | ORAL_CAPSULE | Freq: Every day | ORAL | Status: DC

## 2017-12-10 MED ORDER — ACETAMINOPHEN 500 MG PO TABS
1000.0000 mg | ORAL_TABLET | Freq: Four times a day (QID) | ORAL | Status: AC
  Administered 2017-12-10 – 2017-12-11 (×4): 1000 mg via ORAL
  Filled 2017-12-10 (×4): qty 2

## 2017-12-10 MED ORDER — RISAQUAD PO CAPS
1.0000 | ORAL_CAPSULE | Freq: Every day | ORAL | Status: DC
  Administered 2017-12-11: 1 via ORAL
  Filled 2017-12-10: qty 1

## 2017-12-10 MED ORDER — ONDANSETRON HCL 4 MG/2ML IJ SOLN: 4.0000 mg | Freq: Four times a day (QID) | INTRAMUSCULAR | Status: DC | PRN

## 2017-12-10 MED ORDER — ONDANSETRON HCL 4 MG/2ML IJ SOLN
INTRAMUSCULAR | Status: DC | PRN
  Administered 2017-12-10: 4 mg via INTRAVENOUS

## 2017-12-10 MED ORDER — LIDOCAINE HCL (PF) 1 % IJ SOLN
INTRAMUSCULAR | Status: AC
  Filled 2017-12-10: qty 30

## 2017-12-10 MED ORDER — TRANEXAMIC ACID-NACL 1000-0.7 MG/100ML-% IV SOLN
1000.0000 mg | Freq: Once | INTRAVENOUS | Status: AC
  Administered 2017-12-10: 1000 mg via INTRAVENOUS
  Filled 2017-12-10: qty 100

## 2017-12-10 MED ORDER — POLYETHYLENE GLYCOL 3350 17 G PO PACK: 17.0000 g | PACK | Freq: Every day | ORAL | Status: DC | PRN

## 2017-12-10 MED ORDER — CALCIUM CARBONATE-VITAMIN D 500-200 MG-UNIT PO TABS
2.0000 | ORAL_TABLET | Freq: Every day | ORAL | Status: DC
  Administered 2017-12-11: 2 via ORAL
  Filled 2017-12-10: qty 2

## 2017-12-10 MED ORDER — PROBIOTIC-PREBIOTIC 1-250 BILLION-MG PO CAPS: ORAL_CAPSULE | Freq: Every day | ORAL | Status: DC

## 2017-12-10 MED ORDER — MENTHOL 3 MG MT LOZG: 1.0000 | LOZENGE | OROMUCOSAL | Status: DC | PRN

## 2017-12-10 MED ORDER — B COMPLEX PO TABS: 1.0000 | ORAL_TABLET | Freq: Every day | ORAL | Status: DC

## 2017-12-10 MED ORDER — FLUTICASONE PROPIONATE 50 MCG/ACT NA SUSP
2.0000 | Freq: Every day | NASAL | Status: DC
  Administered 2017-12-11: 2 via NASAL
  Filled 2017-12-10: qty 16

## 2017-12-10 MED ORDER — FERROUS SULFATE 325 (65 FE) MG PO TABS
325.0000 mg | ORAL_TABLET | Freq: Every day | ORAL | Status: DC
  Filled 2017-12-10: qty 1

## 2017-12-10 MED ORDER — LACTATED RINGERS IV SOLN
INTRAVENOUS | Status: DC | PRN
  Administered 2017-12-10 (×2): via INTRAVENOUS

## 2017-12-10 MED ORDER — BUPIVACAINE LIPOSOME 1.3 % IJ SUSP
INTRAMUSCULAR | Status: DC | PRN
  Administered 2017-12-10: 10 mL via PERINEURAL

## 2017-12-10 MED ORDER — BISACODYL 10 MG RE SUPP: 10.0000 mg | Freq: Every day | RECTAL | Status: DC | PRN

## 2017-12-10 MED ORDER — OXYCODONE HCL 5 MG PO TABS: 10.0000 mg | ORAL_TABLET | ORAL | Status: DC | PRN

## 2017-12-10 MED ORDER — PROPOFOL 10 MG/ML IV BOLUS
INTRAVENOUS | Status: AC
  Filled 2017-12-10: qty 20

## 2017-12-10 MED ORDER — LORAZEPAM 1 MG PO TABS
1.0000 mg | ORAL_TABLET | Freq: Every day | ORAL | Status: DC
  Administered 2017-12-10: 1 mg via ORAL
  Filled 2017-12-10: qty 1

## 2017-12-10 MED ORDER — METHOCARBAMOL 500 MG PO TABS
500.0000 mg | ORAL_TABLET | Freq: Four times a day (QID) | ORAL | Status: DC | PRN
  Administered 2017-12-10: 500 mg via ORAL
  Filled 2017-12-10: qty 1

## 2017-12-10 MED ORDER — OXYCODONE HCL 5 MG PO TABS: 5.0000 mg | ORAL_TABLET | Freq: Once | ORAL | Status: DC | PRN

## 2017-12-10 MED ORDER — OXYCODONE HCL 5 MG PO TABS: 5.0000 mg | ORAL_TABLET | ORAL | 0 refills | Status: DC | PRN

## 2017-12-10 MED ORDER — SODIUM CHLORIDE 0.9 % IV SOLN
INTRAVENOUS | Status: DC | PRN
  Administered 2017-12-10: 60 ug/min via INTRAVENOUS

## 2017-12-10 MED ORDER — DIPHENHYDRAMINE HCL (SLEEP) 25 MG PO CAPS
25.0000 mg | ORAL_CAPSULE | Freq: Every day | ORAL | Status: DC
  Filled 2017-12-10: qty 1

## 2017-12-10 MED ORDER — SENNA-DOCUSATE SODIUM 8.6-50 MG PO TABS: 2.0000 | ORAL_TABLET | Freq: Every day | ORAL | 1 refills | Status: DC

## 2017-12-10 MED ORDER — MAGNESIUM CITRATE PO SOLN: 1.0000 | Freq: Once | ORAL | Status: DC | PRN

## 2017-12-10 MED ORDER — HYDROMORPHONE HCL 1 MG/ML IJ SOLN: 0.2500 mg | INTRAMUSCULAR | Status: DC | PRN

## 2017-12-10 MED ORDER — LIDOCAINE HCL (CARDIAC) PF 100 MG/5ML IV SOSY
PREFILLED_SYRINGE | INTRAVENOUS | Status: DC | PRN
  Administered 2017-12-10: 40 mg via INTRAVENOUS

## 2017-12-10 MED ORDER — ZOLPIDEM TARTRATE 5 MG PO TABS
5.0000 mg | ORAL_TABLET | Freq: Every day | ORAL | Status: DC
  Administered 2017-12-10: 5 mg via ORAL
  Filled 2017-12-10: qty 1

## 2017-12-10 MED ORDER — ALUM & MAG HYDROXIDE-SIMETH 200-200-20 MG/5ML PO SUSP: 30.0000 mL | ORAL | Status: DC | PRN

## 2017-12-10 MED ORDER — METHOCARBAMOL 500 MG IVPB - SIMPLE MED
INTRAVENOUS | Status: AC
  Filled 2017-12-10: qty 50

## 2017-12-10 MED ORDER — METOCLOPRAMIDE HCL 5 MG/ML IJ SOLN: 5.0000 mg | Freq: Three times a day (TID) | INTRAMUSCULAR | Status: DC | PRN

## 2017-12-10 MED ORDER — LORATADINE 10 MG PO TABS
10.0000 mg | ORAL_TABLET | Freq: Every day | ORAL | Status: DC
  Administered 2017-12-11: 10 mg via ORAL
  Filled 2017-12-10: qty 1

## 2017-12-10 SURGICAL SUPPLY — 60 items
BAG ZIPLOCK 12X15 (MISCELLANEOUS) ×2 IMPLANT
BASEPLATE GLENOSPHERE 25 (Plate) ×2 IMPLANT
BEARING HUMERAL SHLDER 36M STD (Shoulder) ×1 IMPLANT
BIT DRILL TWIST 2.7 (BIT) ×2 IMPLANT
BLADE SAW SAG 73X25 THK (BLADE) ×1
BLADE SAW SGTL 73X25 THK (BLADE) ×1 IMPLANT
BOOTIES KNEE HIGH SLOAN (MISCELLANEOUS) ×4 IMPLANT
BOWL SMART MIX CTS (DISPOSABLE) IMPLANT
CLSR STERI-STRIP ANTIMIC 1/2X4 (GAUZE/BANDAGES/DRESSINGS) ×2 IMPLANT
COVER BACK TABLE 60X90IN (DRAPES) ×2 IMPLANT
COVER MAYO STAND STRL (DRAPES) ×2 IMPLANT
COVER SURGICAL LIGHT HANDLE (MISCELLANEOUS) ×2 IMPLANT
COVER WAND RF STERILE (DRAPES) ×2 IMPLANT
DRAPE LG THREE QUARTER DISP (DRAPES) ×2 IMPLANT
DRAPE SURG 17X11 SM STRL (DRAPES) ×2 IMPLANT
DRAPE U-SHAPE 47X51 STRL (DRAPES) ×2 IMPLANT
DRSG MEPILEX BORDER 4X8 (GAUZE/BANDAGES/DRESSINGS) ×2 IMPLANT
DURAPREP 26ML APPLICATOR (WOUND CARE) ×2 IMPLANT
ELECT REM PT RETURN 15FT ADLT (MISCELLANEOUS) ×2 IMPLANT
GLENOID SPHERE STD STRL 36MM (Orthopedic Implant) ×2 IMPLANT
GLOVE BIOGEL M STRL SZ7.5 (GLOVE) ×2 IMPLANT
GLOVE BIOGEL PI IND STRL 7.0 (GLOVE) ×2 IMPLANT
GLOVE BIOGEL PI IND STRL 7.5 (GLOVE) ×1 IMPLANT
GLOVE BIOGEL PI IND STRL 8 (GLOVE) ×2 IMPLANT
GLOVE BIOGEL PI INDICATOR 7.0 (GLOVE) ×2
GLOVE BIOGEL PI INDICATOR 7.5 (GLOVE) ×1
GLOVE BIOGEL PI INDICATOR 8 (GLOVE) ×2
GLOVE ORTHO TXT STRL SZ7.5 (GLOVE) ×2 IMPLANT
GLOVE SURG ORTHO 8.0 STRL STRW (GLOVE) ×2 IMPLANT
GOWN STRL REUS W/ TWL XL LVL3 (GOWN DISPOSABLE) ×3 IMPLANT
GOWN STRL REUS W/TWL 2XL LVL3 (GOWN DISPOSABLE) ×2 IMPLANT
GOWN STRL REUS W/TWL XL LVL3 (GOWN DISPOSABLE) ×3
HOOD PEEL AWAY FLYTE STAYCOOL (MISCELLANEOUS) ×6 IMPLANT
KIT BASIN OR (CUSTOM PROCEDURE TRAY) ×2 IMPLANT
PACK SHOULDER (CUSTOM PROCEDURE TRAY) ×2 IMPLANT
PIN THREADED REVERSE (PIN) ×2 IMPLANT
POSITIONER SURGICAL ARM (MISCELLANEOUS) ×2 IMPLANT
SCREW BONE LOCKING 4.75X30X3.5 (Screw) ×4 IMPLANT
SCREW BONE STRL 6.5MMX25MM (Screw) ×2 IMPLANT
SCREW LOCKING 4.75MMX15MM (Screw) ×4 IMPLANT
SHOULDER HUMERAL BEAR 36M STD (Shoulder) ×2 IMPLANT
SLING ARM IMMOBILIZER LRG (SOFTGOODS) ×2 IMPLANT
SLING ARM IMMOBILIZER MED (SOFTGOODS) ×2 IMPLANT
SPONGE LAP 18X18 X RAY DECT (DISPOSABLE) ×2 IMPLANT
STEM HUMERAL STRL 9MX55MM (Stem) ×2 IMPLANT
STRIP CLOSURE SKIN 1/2X4 (GAUZE/BANDAGES/DRESSINGS) ×2 IMPLANT
SUCTION FRAZIER HANDLE 12FR (TUBING) ×1
SUCTION TUBE FRAZIER 12FR DISP (TUBING) ×1 IMPLANT
SUPPORT WRAP ARM LG (MISCELLANEOUS) ×2 IMPLANT
SUT FIBERWIRE #2 38 T-5 BLUE (SUTURE) ×12
SUT VIC AB 1 CT1 27 (SUTURE) ×1
SUT VIC AB 1 CT1 27XBRD ANTBC (SUTURE) ×1 IMPLANT
SUT VIC AB 2-0 CT1 27 (SUTURE) ×1
SUT VIC AB 2-0 CT1 TAPERPNT 27 (SUTURE) ×1 IMPLANT
SUT VIC AB 3-0 SH 8-18 (SUTURE) ×2 IMPLANT
SUTURE FIBERWR #2 38 T-5 BLUE (SUTURE) ×6 IMPLANT
TOWEL OR 17X26 10 PK STRL BLUE (TOWEL DISPOSABLE) ×2 IMPLANT
TOWEL OR NON WOVEN STRL DISP B (DISPOSABLE) ×2 IMPLANT
TOWER CARTRIDGE SMART MIX (DISPOSABLE) IMPLANT
TRAY HUM MINI SHOULDER +3 40 (Joint) ×2 IMPLANT

## 2017-12-10 NOTE — Op Note (Signed)
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  ANESTHESIA:   General with Exparel interscalene block  ESTIMATED BLOOD LOSS: 200 mL  UNIQUE ASPECTS OF THE CASE: She was extremely small, she did have some loose bodies around the glenoid, her supraspinatus was still present, but billowed up, and was paperthin on the top.  I debated doing a standard total shoulder replacement, but I did not feel that she had functional rotator cuff, she had significant atrophy and thinning as seen on her preoperative MRI.  Bone quality was mediocre.  I had excellent soft tissue repair after the procedure with closure of the rotator interval and the subscapularis.  The reduction was a little bit tight, but no tension on the conjoined tendon, and excellent motion and stability after the procedure.  It will be interesting to see her recovery of motion, preoperatively she had only about 40 degrees of active motion, and this was a chronic condition.  PREOPERATIVE INDICATIONS:  Hailey [name] is a  77 y.o. female with a diagnosis of left shoulder osteoarthritis with atrophic rotator cuff thinning and dysfunction who failed conservative measures and elected for surgical management.    The risks benefits and alternatives were discussed with the patient preoperatively including but not limited to the risks of infection, bleeding, nerve injury, cardiopulmonary complications, the need for revision surgery, dislocation, brachial plexus palsy, incomplete relief of pain, among others, and the patient was willing to proceed.  OPERATIVE  IMPLANTS: Biomet size 9 humeral stem press-fit standard with a 40 mm reverse shoulder arthroplasty tray with a standard liner and a 36 mm glenosphere with a mini baseplate and 4 locking screws and one central nonlocking screw.  I used the +3 offset baseplate, providing better superior shift of the baseplate to minimize notching, and also improving posterior coverage.  OPERATIVE FINDINGS: Endstage glenohumeral joint changes with loss of cartilage on both the humeral head as well as the glenoid with a small amount of osteophyte formation on the humeral head.  There were loose bodies contained and attached to the labrum.  The supraspinatus was extremely thin, and paper thin at the undersurface junction of the greater tuberosity.  OPERATIVE PROCEDURE: The patient was brought to the operating room and placed in the supine position. General anesthesia was administered. IV antibiotics were given.  Time out was performed. The upper extremity was prepped and draped in usual sterile fashion. The patient was in a beachchair position. Deltopectoral approach was carried out. The biceps was tenodesed to the pectoralis tendon with #2 Fiberwire. The subscapularis was released off of the bone.   I then performed circumferential releases of the humerus, and then dislocated the head, and then reamed with the reamer to the above named size.  I then applied the jig, and cut the humeral head in 30 of retroversion, and then turned my attention to the glenoid.  Deep retractors were placed, and I resected the labrum, and then placed a guidepin into the center position on the glenoid, with slight inferior inclination. I then reamed over the guidepin, and this created a small metaphyseal cancellus blush inferiorly, removing just the cartilage to the subchondral bone  superiorly. The base plate was selected and impacted place, and then I secured it centrally with a nonlocking screw, and I had excellent purchase both inferiorly and  superiorly. I placed a short locking screws on anterior and posterior aspects.  The central screw measured 25, the superior screw measured 30, and the inferior screw was also 30.  I then turned my attention to the glenosphere, and impacted this into place, placing slight inferior offset (set on B).   The glenoid sphere was completely seated, and had engagement of the [facility] taper. I then turned my attention back to the humerus.  I sequentially broached, and then trialed, and was found to restore soft tissue tension, and it had slightly more than 2 finger tightness. Therefore the above named components were selected. The shoulder felt stable throughout functional motion.  Before I placed the real prosthesis I had also placed a total of 3 #2 FiberWire through drill holes in the humerus for later subscapularis repair.  I then impacted the real prosthesis into place, as well as the real humeral tray, and reduced the shoulder. The shoulder had excellent motion, and was stable, and I irrigated the wounds copiously.  I repaired the rotator interval and the corner with #2 FiberWire.   I then used the transosseous sutures to repair the subscapularis. This came down to bone.  I then irrigated the shoulder copiously once more, repaired the deltopectoral interval with Vicryl followed by subcutaneous Vicryl with Steri-Strips and sterile gauze for the skin. The patient was awakened and returned back in stable and satisfactory condition. There no complications and She tolerated the procedure well.

## 2017-12-10 NOTE — Anesthesia Procedure Notes (Signed)
Anesthesia Regional Block: Interscalene brachial plexus block   Pre-Anesthetic Checklist: ,, timeout performed, Correct Patient, Correct Site, Correct Laterality, Correct Procedure, Correct Position, site marked, Risks and benefits discussed,  Surgical consent,  Pre-op evaluation,  At surgeon's request and post-op pain management  Laterality: Left  Prep: chloraprep       Needles:  Injection technique: Single-shot  Needle Type: Stimiplex     Needle Length: 9cm  Needle Gauge: 21     Additional Needles:   Procedures:,,,, ultrasound used (permanent image in chart),,,,  Narrative:  Start time: 12/10/2017 7:08 AM End time: 12/10/2017 7:13 AM Injection made incrementally with aspirations every 5 mL.  Performed by: Personally  Anesthesiologist: Lynda Rainwater, MD

## 2017-12-10 NOTE — Discharge Instructions (Signed)

## 2017-12-10 NOTE — Anesthesia Preprocedure Evaluation (Signed)
Anesthesia Evaluation    Airway Mallampati: II  TM Distance: >3 FB Neck ROM: Full    Dental no notable dental hx.    Pulmonary sleep apnea , former smoker,    Pulmonary exam normal breath sounds clear to auscultation       Cardiovascular hypertension, Pt. on medications Normal cardiovascular exam Rhythm:Regular Rate:Normal     Neuro/Psych Anxiety Depression    GI/Hepatic GERD  ,  Endo/Other    Renal/GU      Musculoskeletal  (+) Arthritis , Osteoarthritis,    Abdominal   Peds  Hematology   Anesthesia Other Findings   Reproductive/Obstetrics                             Anesthesia Physical Anesthesia Plan  ASA: II  Anesthesia Plan: General   Post-op Pain Management: GA combined w/ Regional for post-op pain   Induction: Intravenous  PONV Risk Score and Plan: 3 and Ondansetron, Dexamethasone, Midazolam and Treatment may vary due to age or medical condition  Airway Management Planned: LMA  Additional Equipment:   Intra-op Plan:   Post-operative Plan: Extubation in OR  Informed Consent: I have reviewed the patients History and Physical, chart, labs and discussed the procedure including the risks, benefits and alternatives for the proposed anesthesia with the patient or authorized representative who has indicated his/her understanding and acceptance.   Dental advisory given  Plan Discussed with: CRNA  Anesthesia Plan Comments:         Anesthesia Quick Evaluation

## 2017-12-10 NOTE — Anesthesia Postprocedure Evaluation (Signed)
Anesthesia Post Note  Patient: Hailey [name]  Procedure(s) Performed: LEFT REVERSE SHOULDER ARTHROPLASTY (Left Shoulder)     Patient location during evaluation: PACU Anesthesia Type: General Level of consciousness: awake and alert Pain management: pain level controlled Vital Signs Assessment: post-procedure vital signs reviewed and stable Respiratory status: spontaneous breathing, nonlabored ventilation and respiratory function stable Cardiovascular status: blood pressure returned to baseline and stable Postop Assessment: no apparent nausea or vomiting Anesthetic complications: no    Last Vitals:  Vitals:   12/10/17 1130 12/10/17 1156  BP: 122/69 117/73  Pulse: 76 81  Resp: 12 15  Temp: 37 C 36.7 C  SpO2: 100% 100%    Last Pain:  Vitals:   12/10/17 1156  TempSrc: Oral  PainSc:                  Lynda Rainwater

## 2017-12-10 NOTE — Anesthesia Procedure Notes (Signed)
Procedure Name: Intubation Date/Time: 12/10/2017 7:50 AM Performed by: Lynda Rainwater, MD Pre-anesthesia Checklist: Patient identified, Suction available, Emergency Drugs available, Timeout performed and Patient being monitored Patient Re-evaluated:Patient Re-evaluated prior to induction Oxygen Delivery Method: Circle system utilized Preoxygenation: Pre-oxygenation with 100% oxygen Induction Type: IV induction Ventilation: Mask ventilation without difficulty Laryngoscope Size: Mac and 4 Grade View: Grade I Tube type: Oral Tube size: 7.0 mm Number of attempts: 1 Airway Equipment and Method: Stylet Placement Confirmation: ETT inserted through vocal cords under direct vision,  positive ETCO2 and breath sounds checked- equal and bilateral Secured at: 21 cm Tube secured with: Tape Dental Injury: Teeth and Oropharynx as per pre-operative assessment

## 2017-12-10 NOTE — Transfer of Care (Signed)
Immediate Anesthesia Transfer of Care Note  Patient: Hailey [name]  Procedure(s) Performed: LEFT REVERSE SHOULDER ARTHROPLASTY (Left Shoulder)  Patient Location: PACU  Anesthesia Type:General  Level of Consciousness: awake, alert , oriented and patient cooperative  Airway & Oxygen Therapy: Patient Spontanous Breathing and Patient connected to face mask oxygen  Post-op Assessment: Report given to RN, Post -op Vital signs reviewed and stable and Patient moving all extremities  Post vital signs: Reviewed and stable  Last Vitals:  Vitals Value Taken Time  BP 145/83 12/10/2017 10:30 AM  Temp    Pulse 78 12/10/2017 10:31 AM  Resp 13 12/10/2017 10:31 AM  SpO2 100 % 12/10/2017 10:31 AM  Vitals shown include unvalidated device data.  Last Pain:  Vitals:   12/10/17 0611  TempSrc: Oral  PainSc:       Patients Stated Pain Goal: 4 (25/05/[birth year] 7673)  Complications: No apparent anesthesia complications

## 2017-12-10 NOTE — H&P (Signed)
PREOPERATIVE H&P  Chief Complaint: left shoulder pain  HPI: Hailey [name] is a 77 y.o. female who presents for preoperative history and physical with a diagnosis of left rotator cuff arthropathy. Symptoms are rated as moderate to severe, and have been worsening.  This is significantly impairing activities of daily living.  She has elected for surgical management.   She has failed injections, activity modification, anti-inflammatories.  Preoperative X-rays and MRI demonstrate end stage degenerative changes with osteophyte formation, loss of joint space, subchondral sclerosis, and dysfunctional rotator cuff.   Past Medical History:  Diagnosis Date  . Allergic rhinitis, cause unspecified   . Carotid artery disease ([place]) 12/13/2014   1-39% stenosis of bilateral CCAs.   Repeat ultrasound 11/2016.  Marland Kitchen Cervical spine fracture ([place]) 02/01/2014  . Chronic insomnia   . Chronic interstitial cystitis   . Closed fracture of rib(s), unspecified   . Disorder of bone and cartilage, unspecified   . Esophageal reflux   . 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 OSA (obstructive sleep apnea)   . Osteoarthrosis, unspecified whether generalized or localized, unspecified site   . Other and unspecified hyperlipidemia   . Personal history of colonic adenomas 11/28/2012  . Personal history of other disorders of nervous system and sense organs   . SAH (subarachnoid hemorrhage) ([place]) 02/02/2014  . Unspecified closed fracture of pelvis    x 2 2004/2006  . Unspecified essential hypertension    Past Surgical History:  Procedure Laterality Date  . ANKLE FUSION Left   . BACK SURGERY     ; has  had 8 back surgeries  . CERVICAL FUSION     C4-7  . COLONOSCOPY    . CYSTOSCOPY    . HERNIA REPAIR     umbilical  . LUMBAR FUSION     L2-3  . SACRAL FUSION  07/2012  . TIBIA FRACTURE SURGERY Right   . TONSILLECTOMY    . TOTAL HIP ARTHROPLASTY Right 2012  . TUBAL LIGATION    .  UPPER GASTROINTESTINAL ENDOSCOPY     Social History   Socioeconomic History  . Marital status: Widowed    Spouse name: Not on file  . Number of children: Not on file  . Years of education: Not on file  . Highest education level: Not on file  Occupational History  . 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 Financial resource strain: Not hard at all  . Food insecurity:    Worry: Never true    Inability: Never true  . Transportation needs:    Medical: No    Non-medical: No  Tobacco Use  . Smoking status: Former Smoker    Packs/day: 2.00    Years: 10.00    Pack years: 20.00    Types: Cigarettes    Last attempt to quit: 02/13/1967    Years since quitting: 50.8  . Smokeless tobacco: Never Used  . Tobacco comment: smoked 1961-1969 , up to 3 ppd, mainly 2 ppd  Substance and Sexual Activity  . Alcohol use: No  . Drug use: No  . Sexual activity: Never  Lifestyle  . Physical activity:    Days per week: 0 days    Minutes per session: 0 min  . Stress: To some extent  Relationships  . Social connections:    Talks on phone: More than three times a week    Gets  together: More than three times a week    Attends religious service: Not on file    Active member of club or organization: Not on file    Attends meetings of clubs or organizations: Not on file    Relationship status: Divorced  Other Topics Concern  . Not on file  Social History Narrative  . Not on file   Family History  Problem Relation Age of Onset  . Dementia Mother   . Hypertension Father   . Heart attack Father   . Allergic rhinitis Father   . Cancer Maternal Aunt        breast cancer  . Alzheimer's disease Maternal Aunt   . Stroke Paternal Uncle   . Cancer Cousin        breast cancer  . Colon cancer Neg Hx   . Angioedema Neg Hx   . Asthma Neg Hx   . Eczema Neg Hx    Allergies  Allergen Reactions  . Resorcinol Swelling and Other (See Comments)     blisters   Prior to Admission medications   Medication Sig Start Date End Date Taking? Authorizing Provider  aspirin EC 81 MG tablet Take 81 mg by mouth daily. 08/20/12  Yes [provider]  atorvastatin (LIPITOR) 40 MG tablet Take 1 tablet (40 mg total) by mouth daily. 03/01/17  Yes Hoyt Koch, MD  B Complex Vitamins (B COMPLEX PO) Take 1 tablet by mouth daily.   Yes [provider]  Bacillus Coagulans-Inulin (PROBIOTIC-PREBIOTIC PO) Take 1 capsule by mouth daily.   Yes [provider]  Calcium Carbonate-Vitamin D (CALCIUM + D PO) Take 1,200 mg by mouth daily.    Yes [provider]  celecoxib (CELEBREX) 200 MG capsule Take 1 capsule (200 mg total) by mouth daily. 03/01/17  Yes Hoyt Koch, MD  Coenzyme Q10 (CO Q 10) 100 MG CAPS Take 200 mg by mouth daily.   Yes [provider]  DHA-EPA-Flaxseed Oil-Vitamin E (THERA TEARS NUTRITION PO) Place 1 drop into both eyes daily as needed (dry eyes).   Yes [provider]  diclofenac sodium (VOLTAREN) 1 % GEL Apply 2 g topically daily as needed (pain).   Yes [provider]  diphenhydrAMINE HCl, Sleep, (ZZZQUIL PO) Take 1 tablet by mouth daily.   Yes [provider]  escitalopram (LEXAPRO) 20 MG tablet Take 1 tablet (20 mg total) by mouth daily. 03/01/17  Yes Hoyt Koch, MD  ferrous sulfate 325 (65 FE) MG tablet Take 325 mg by mouth daily with breakfast.     Yes [provider]  fexofenadine (ALLEGRA) 180 MG tablet Take 180 mg by mouth daily.     Yes [provider]  fluticasone (FLONASE) 50 MCG/ACT nasal spray Use 1 spray each nostril 1-2 times daily as needed Patient taking differently: Place 2 sprays into both nostrils 2 (two) times daily. Use 1 spray each nostril 1-2 times daily as needed 02/14/16  Yes Bobbitt, [place] Muta, MD  HYDROcodone-acetaminophen (NORCO/VICODIN) 5-325 MG tablet Take 1 tablet by mouth daily as needed for pain.  09/16/17  Yes [provider]  lidocaine (LIDODERM) 5 % Place 1 patch onto the skin daily. Remove & Discard patch within 12 hours or as directed by MD   Yes [provider]  LORazepam (ATIVAN) 1 MG tablet TAKE 1 OR 2 TABLETS AT BEDTIME. Patient taking differently: Take 1-2 mg by mouth at bedtime.  09/16/17  Yes Biagio Borg, MD  methocarbamol (  ROBAXIN) 500 MG tablet TAKE 1 TABLET THREE TIMES DAILY AS NEEDED FOR SPASM. Patient taking differently: Take 500 mg by mouth 2 (two) times daily. TAKE 1 TABLET THREE TIMES DAILY AS NEEDED FOR SPASM. 03/01/17  Yes Hoyt Koch, MD  Misc Natural Products (OSTEO BI-FLEX TRIPLE STRENGTH PO) Take 1 tablet by mouth 2 (two) times daily.    Yes [provider]  Multiple Vitamin (MULTIVITAMIN PO) Take 1 tablet by mouth daily.    Yes [provider]  Omega 3-6-9 Fatty Acids (OMEGA 3-6-9 COMPLEX PO) Take 1 capsule by mouth daily.   Yes [provider]  OVER THE COUNTER MEDICATION Take 1 tablet by mouth daily. Maximum Green Complete   Yes [provider]  traMADol-acetaminophen (ULTRACET) 37.5-325 MG tablet TAKE 1 OR 2 TABLETS EVERY 6 HOURS AS NEEDED. Patient taking differently: Take 1-2 tablets by mouth every 6 (six) hours as needed for severe pain. TAKE 1 OR 2 TABLETS EVERY 6 HOURS AS NEEDED. 08/30/17  Yes Hoyt Koch, MD  Turmeric 500 MG TABS Take 1,500 mg by mouth daily.   Yes [provider]  vitamin E 400 UNIT capsule Take 400 Units by mouth daily.   Yes [provider]  zolpidem (AMBIEN) 5 MG tablet TAKE 1 TABLET AT BEDTIME AS NEEDED. Patient taking differently: Take 5 mg by mouth at bedtime.  09/16/17  Yes Biagio Borg, MD  denosumab (PROLIA) 60 MG/ML SOLN injection Inject 60 mg into the skin every 6 (six) months. Administer in upper arm, thigh, or abdomen    [provider]  ranitidine (ZANTAC) 150 MG tablet Take 1 tablet (150 mg total) by mouth 2 (two) times  daily. Patient not taking: Reported on 11/25/2017 03/01/17   Hoyt Koch, MD     Positive ROS: All other systems have been reviewed and were otherwise negative with the exception of those mentioned in the HPI and as above.  Physical Exam: General: Alert, no acute distress Cardiovascular: No pedal edema Respiratory: No cyanosis, no use of accessory musculature GI: No organomegaly, abdomen is soft and non-tender Skin: No lesions in the area of chief complaint Neurologic: Sensation intact distally Psychiatric: Patient is competent for consent with normal mood and affect Lymphatic: No axillary or cervical lymphadenopathy  MUSCULOSKELETAL: AROM 0-40 degrees, 0 ER, severe weakness  Assessment: Left rotator cuff arthropathy   Plan: Plan for Procedure(s): LEFT REVERSE SHOULDER ARTHROPLASTY  The risks benefits and alternatives were discussed with the patient including but not limited to the risks of nonoperative treatment, versus surgical intervention including infection, bleeding, nerve injury,  blood clots, cardiopulmonary complications, morbidity, mortality, among others, and they were willing to proceed.     Johnny Bridge, MD Cell [phone]   12/10/2017 7:19 AM

## 2017-12-10 NOTE — Progress Notes (Signed)
PHARMACIST - PHYSICIAN ORDER COMMUNICATION  CONCERNING: P&T Medication Policy on Herbal Medications  DESCRIPTION:  This patient's order for:  C0Q 10 has been noted.  This product(s) is classified as an "herbal" or natural product. Due to a lack of definitive safety studies or FDA approval, nonstandard manufacturing practices, plus the potential risk of unknown drug-drug interactions while on inpatient medications, the Pharmacy and Therapeutics Committee does not permit the use of "herbal" or natural products of this type within [facility].   ACTION TAKEN: The pharmacy department is unable to verify this order at this time and your patient has been informed of this safety policy. Please reevaluate patient's clinical condition at discharge and address if the herbal or natural product(s) should be resumed at that time.

## 2017-12-11 ENCOUNTER — Telehealth: Payer: Self-pay | Admitting: *Deleted

## 2017-12-11 ENCOUNTER — Encounter (HOSPITAL_COMMUNITY): Payer: Self-pay | Admitting: Orthopedic Surgery

## 2017-12-11 LAB — BASIC METABOLIC PANEL
BUN: 20 mg/dL (ref 8–23)
CALCIUM: 8.2 mg/dL — AB (ref 8.9–10.3)
CO2: 29 mmol/L (ref 22–32)
Chloride: 102 mmol/L (ref 98–111)
Creatinine, Ser: 0.86 mg/dL (ref 0.44–1.00)
GFR calc non Af Amer: >60 mL/min (ref >60)
Glucose, Bld: 102 mg/dL — ABNORMAL HIGH (ref 70–99)
Potassium: 4 mmol/L (ref 3.5–5.1)
Sodium: 133 mmol/L — ABNORMAL LOW (ref 135–145)

## 2017-12-11 LAB — CBC
HEMATOCRIT: 30.2 % — AB (ref 36.0–46.0)
Hemoglobin: 9.4 g/dL — ABNORMAL LOW (ref 12.0–15.0)
MCH: 29.8 pg (ref 26.0–34.0)
MCHC: 31.1 g/dL (ref 30.0–36.0)
MCV: 95.9 fL (ref 80.0–100.0)
NRBC: 0 % (ref 0.0–0.2)
Platelets: 133 10*3/uL — ABNORMAL LOW (ref 150–400)
RBC: 3.15 MIL/uL — ABNORMAL LOW (ref 3.87–5.11)
RDW: 13.7 % (ref 11.5–15.5)
WBC: 5.8 10*3/uL (ref 4.0–10.5)

## 2017-12-11 NOTE — Telephone Encounter (Signed)
Pt was on the Patient Ping Acct. She had been admitted 12/10/2017 with a diagnosis of Left rotator cuff tear arthropathy and went a left reverse shoulder arthropathy. Pt will follow-up with surgeon in 2 weeks.Marland KitchenJohny Chess

## 2017-12-11 NOTE — Plan of Care (Signed)
Plan of care reviewed and discussed with the patient. 

## 2017-12-11 NOTE — Evaluation (Signed)
Occupational Therapy Evaluation Patient Details Name: Hailey [name] MRN: [id number] DOB: 07-May-1940 Today's Date: 12/11/2017    History of Present Illness NANCYJO GIVHAN is an 77 y.o. female who was admitted 12/10/2017 with a diagnosis of Left rotator cuff tear arthropathy and went to the operating room on 12/10/2017    Clinical Impression  Pt admitted for the above. Pt currently with functional limitations due to the deficits listed below (see OT Problem List).  Pt will benefit from skilled OT to increase their safety and independence with ADL and functional mobility for ADL to facilitate discharge to venue listed below.      Follow Up Recommendations  Follow surgeon's recommendation for DC plan and follow-up therapies    Equipment Recommendations  None recommended by OT    Recommendations for Other Services       Precautions / Restrictions Precautions Precautions: Shoulder Type of Shoulder Precautions: pt allowed to perform elbow, wrist and hand ROM only Shoulder Interventions: Off for dressing/bathing/exercises;Shoulder sling/immobilizer      Mobility Bed Mobility Overal bed mobility: Needs Assistance Bed Mobility: Supine to Sit;Sit to Supine     Supine to sit: Mod assist Sit to supine: Mod assist      Transfers Overall transfer level: Needs assistance Equipment used: 1 person hand held assist Transfers: Sit to/from [employer] Transfers Sit to Stand: Min assist Stand pivot transfers: Min assist                ADL either performed or assessed with clinical judgement                      Pertinent Vitals/Pain Pain Assessment: 0-10 Pain Score: 3  Pain Descriptors / Indicators: Discomfort;Aching Pain Intervention(s): Limited activity within patient's tolerance;Repositioned     Hand Dominance Right   Extremity/Trunk Assessment Upper Extremity Assessment Upper Extremity Assessment: LUE deficits/detail LUE Deficits / Details: s/p surgery           Communication Communication Communication: No difficulties   Cognition Arousal/Alertness: Awake/alert Behavior During Therapy: WFL for tasks assessed/performed Overall Cognitive Status: Within Functional Limits for tasks assessed                                        Exercises  elbow, wrist and hand only   Shoulder Instructions Shoulder Instructions Donning/doffing shirt without moving shoulder: Moderate assistance;Caregiver independent with task;Patient able to independently direct caregiver Method for sponge bathing under operated UE: Minimal assistance;Caregiver independent with task;Patient able to independently direct caregiver Donning/doffing sling/immobilizer: Moderate assistance;Caregiver independent with task;Patient able to independently direct caregiver Correct positioning of sling/immobilizer: Moderate assistance;Patient able to independently direct caregiver;Caregiver independent with task ROM for elbow, wrist and digits of operated UE: Minimal assistance;Patient able to independently direct caregiver;Caregiver independent with task Sling wearing schedule (on at all times/off for ADL's): Supervision/safety Proper positioning of operated UE when showering: Supervision/safety Positioning of UE while sleeping: Supervision/safety;Patient able to independently direct caregiver;Caregiver independent with task    Home Living Family/patient expects to be discharged to:: Private residence Living Arrangements: Alone Available Help at Discharge: Family;Friend(s)   Home Access: Stairs to enter   Entrance Stairs-Rails: [place]: Two level         Bathroom Toilet: [place]: Bedside commode;Shower seat;Walker - 2 wheels;Cane - single point          Prior  Functioning/Environment Level of Independence: Independent with assistive device(s)                 OT Problem List: Decreased strength;Decreased  activity tolerance;Decreased knowledge of precautions;Decreased knowledge of use of DME or AE;Impaired UE functional use;Pain      OT Treatment/Interventions:      OT Goals(Current goals can be found in the care plan section) Acute Rehab OT Goals Patient Stated Goal: home to cat pumpkin OT Goal Formulation: With patient Time For Goal Achievement: 12/11/17  OT Frequency:     Barriers to D/C:               AM-PAC PT "6 Clicks" Daily Activity     Outcome Measure Help from another person eating meals?: A Little Help from another person taking care of personal grooming?: A Little Help from another person toileting, which includes using toliet, bedpan, or urinal?: A Little Help from another person bathing (including washing, rinsing, drying)?: A Little Help from another person to put on and taking off regular upper body clothing?: A Lot Help from another person to put on and taking off regular lower body clothing?: A Little 6 Click Score: 17   End of Session Equipment Utilized During Treatment: (sling) Nurse Communication: Mobility status;Precautions  Activity Tolerance: Patient tolerated treatment well Patient left: in bed;with call bell/phone within reach;with family/visitor present  OT Visit Diagnosis: Muscle weakness (generalized) (M62.81);History of falling (Z91.81);Unsteadiness on feet (R26.81);Pain Pain - Right/Left: Left Pain - part of body: Shoulder                Time: 1030-1108 OT Time Calculation (min): 38 min Charges:  OT General Charges $OT Visit: 1 Visit OT Evaluation $OT Eval Moderate Complexity: 1 Mod OT Treatments $Self Care/Home Management : 8-22 mins  Kari Baars, OT Acute Rehabilitation Services [phone] Office- [place], Edwena Felty D 12/11/2017, 12:05 PM

## 2017-12-11 NOTE — Discharge Summary (Signed)
Physician Discharge Summary  Patient ID: Hailey [name] MRN: [id number] DOB/AGE: Aug 12, 1940 77 y.o.  Admit date: 12/10/2017 Discharge date: 12/11/2017  Admission Diagnoses:  Left rotator cuff tear arthropathy  Discharge Diagnoses:  Principal Problem:   Left rotator cuff tear arthropathy   Past Medical History:  Diagnosis Date  . Allergic rhinitis, cause unspecified   . Carotid artery disease ([place]) 12/13/2014   1-39% stenosis of bilateral CCAs.   Repeat ultrasound 11/2016.  Marland Kitchen Cervical spine fracture ([place]) 02/01/2014  . Chronic insomnia   . Chronic interstitial cystitis   . Closed fracture of rib(s), unspecified   . Disorder of bone and cartilage, unspecified   . Esophageal reflux   . 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 Left rotator cuff tear arthropathy 12/10/2017  . OSA (obstructive sleep apnea)   . Osteoarthrosis, unspecified whether generalized or localized, unspecified site   . Other and unspecified hyperlipidemia   . Personal history of colonic adenomas 11/28/2012  . Personal history of other disorders of nervous system and sense organs   . SAH (subarachnoid hemorrhage) ([place]) 02/02/2014  . Unspecified closed fracture of pelvis    x 2 2004/2006  . Unspecified essential hypertension     Surgeries: Procedure(s): LEFT REVERSE SHOULDER ARTHROPLASTY on 12/10/2017   Consultants (if any):   Discharged Condition: Improved  Hospital Course: Hailey [name] is an 77 y.o. female who was admitted 12/10/2017 with a diagnosis of Left rotator cuff tear arthropathy and went to the operating room on 12/10/2017 and underwent the above named procedures.    She was given perioperative antibiotics:  Anti-infectives (From admission, onward)   Start     Dose/Rate Route Frequency Ordered Stop   12/10/17 1400  ceFAZolin (ANCEF) IVPB 1 g/50 mL premix     1 g 100 mL/hr over 30 Minutes Intravenous Every 6 hours 12/10/17 1153 12/11/17 0239   12/10/17 0600   ceFAZolin (ANCEF) IVPB 2g/100 mL premix     2 g 200 mL/hr over 30 Minutes Intravenous On call to O.R. 12/10/17 2482 12/10/17 0743    .  She was given sequential compression devices, early ambulation, for DVT prophylaxis.  She benefited maximally from the hospital stay and there were no complications.    Recent vital signs:  Vitals:   12/11/17 0206 12/11/17 0608  BP: (!) 88/58 98/63  Pulse: 77 71  Resp: 16 16  Temp: 97.7 F (36.5 C) 98 F (36.7 C)  SpO2: 95% 95%    Recent laboratory studies:  Lab Results  Component Value Date   HGB 9.4 (L) 12/11/2017   HGB 13.5 12/03/2017   HGB 13.2 02/28/2017   Lab Results  Component Value Date   WBC 5.8 12/11/2017   PLT 133 (L) 12/11/2017   Lab Results  Component Value Date   INR 0.99 11/18/2013   Lab Results  Component Value Date   NA 133 (L) 12/11/2017   K 4.0 12/11/2017   CL 102 12/11/2017   CO2 29 12/11/2017   BUN 20 12/11/2017   CREATININE 0.86 12/11/2017   GLUCOSE 102 (H) 12/11/2017    Discharge Medications:   Allergies as of 12/11/2017      Reactions   Resorcinol Swelling, Other (See Comments)   blisters      Medication List    STOP taking these medications   celecoxib 200 MG capsule Commonly known as:  CELEBREX   HYDROcodone-acetaminophen 5-325 MG tablet Commonly known as:  NORCO/VICODIN  TAKE these medications   aspirin EC 81 MG tablet Take 81 mg by mouth daily.   atorvastatin 40 MG tablet Commonly known as:  LIPITOR Take 1 tablet (40 mg total) by mouth daily.   B COMPLEX PO Take 1 tablet by mouth daily.   CALCIUM + D PO Take 1,200 mg by mouth daily.   Co Q 10 100 MG Caps Take 200 mg by mouth daily.   denosumab 60 MG/ML Soln injection Commonly known as:  PROLIA Inject 60 mg into the skin every 6 (six) months. Administer in upper arm, thigh, or abdomen   diclofenac sodium 1 % Gel Commonly known as:  VOLTAREN Apply 2 g topically daily as needed (pain).   escitalopram 20 MG  tablet Commonly known as:  LEXAPRO Take 1 tablet (20 mg total) by mouth daily.   ferrous sulfate 325 (65 FE) MG tablet Take 325 mg by mouth daily with breakfast.   fexofenadine 180 MG tablet Commonly known as:  ALLEGRA Take 180 mg by mouth daily.   fluticasone 50 MCG/ACT nasal spray Commonly known as:  FLONASE Use 1 spray each nostril 1-2 times daily as needed What changed:    how much to take  how to take this  when to take this   lidocaine 5 % Commonly known as:  LIDODERM Place 1 patch onto the skin daily. Remove & Discard patch within 12 hours or as directed by MD   LORazepam 1 MG tablet Commonly known as:  ATIVAN TAKE 1 OR 2 TABLETS AT BEDTIME. What changed:  See the new instructions.   methocarbamol 500 MG tablet Commonly known as:  ROBAXIN TAKE 1 TABLET THREE TIMES DAILY AS NEEDED FOR SPASM. What changed:    how much to take  how to take this  when to take this   MULTIVITAMIN PO Take 1 tablet by mouth daily.   OMEGA 3-6-9 COMPLEX PO Take 1 capsule by mouth daily.   ondansetron 4 MG tablet Commonly known as:  ZOFRAN Take 1 tablet (4 mg total) by mouth every 8 (eight) hours as needed for nausea or vomiting.   OSTEO BI-FLEX TRIPLE STRENGTH PO Take 1 tablet by mouth 2 (two) times daily.   OVER THE COUNTER MEDICATION Take 1 tablet by mouth daily. Maximum Green Complete   oxyCODONE 5 MG immediate release tablet Commonly known as:  Oxy IR/ROXICODONE Take 1 tablet (5 mg total) by mouth every 4 (four) hours as needed for severe pain.   PROBIOTIC-PREBIOTIC PO Take 1 capsule by mouth daily.   ranitidine 150 MG tablet Commonly known as:  ZANTAC Take 1 tablet (150 mg total) by mouth 2 (two) times daily.   sennosides-docusate sodium 8.6-50 MG tablet Commonly known as:  SENOKOT-S Take 2 tablets by mouth daily.   THERA TEARS NUTRITION PO Place 1 drop into both eyes daily as needed (dry eyes).   traMADol-acetaminophen 37.5-325 MG tablet Commonly known  as:  ULTRACET TAKE 1 OR 2 TABLETS EVERY 6 HOURS AS NEEDED. What changed:    how much to take  how to take this  when to take this  reasons to take this   Turmeric 500 MG Tabs Take 1,500 mg by mouth daily.   vitamin E 400 UNIT capsule Take 400 Units by mouth daily.   zolpidem 5 MG tablet Commonly known as:  AMBIEN TAKE 1 TABLET AT BEDTIME AS NEEDED. What changed:  when to take this   ZZZQUIL PO Take 1 tablet by mouth daily.  Diagnostic Studies: Dg Shoulder Left Port  Result Date: 12/10/2017 CLINICAL DATA:  Status post left shoulder replacement EXAM: LEFT SHOULDER - 1 VIEW COMPARISON:  None. FINDINGS: Left shoulder replacement is noted. Some air is noted in the surgical bed. Old left rib fractures are seen. Fixation rods are noted within the thoracic spine. IMPRESSION: Status post left shoulder replacement Electronically Signed   By: Inez Catalina M.D.   On: 12/10/2017 11:13    Disposition: Discharge disposition: 01-Home or Self Care         Follow-up Information    Marchia Bond, MD. Schedule an appointment as soon as possible for a visit in 2 weeks.   Specialty:  Orthopedic Surgery Contact information: [address] [place] [address] [phone]            Signed: Johnny Bridge 12/11/2017, 8:08 AM

## 2017-12-17 ENCOUNTER — Other Ambulatory Visit: Payer: Self-pay | Admitting: Internal Medicine

## 2017-12-17 NOTE — Telephone Encounter (Signed)
Control database checked last refill: 11/25/2017 LOV: 08/30/2017 chronic pain visit  NOV: none

## 2017-12-17 NOTE — Telephone Encounter (Signed)
Will decline as she has been filling multiple different pain medications from several providers since last seen.

## 2017-12-23 DIAGNOSIS — M19012 Primary osteoarthritis, left shoulder: Secondary | ICD-10-CM | POA: Diagnosis not present

## 2017-12-27 ENCOUNTER — Telehealth: Payer: Self-pay | Admitting: Internal Medicine

## 2017-12-27 ENCOUNTER — Other Ambulatory Visit: Payer: Self-pay

## 2017-12-27 NOTE — Telephone Encounter (Signed)
Rec'd frp, [facility] forwarded 2 pages to Dr. Pricilla Holm

## 2018-01-17 ENCOUNTER — Other Ambulatory Visit: Payer: Self-pay | Admitting: Internal Medicine

## 2018-01-17 NOTE — Telephone Encounter (Signed)
Cannot be filled without visit as she has gotten other controlled substances with another provider since our last visit without notification to [place]. Will need to discuss in person.

## 2018-01-17 NOTE — Telephone Encounter (Signed)
Control database checked last refill: 11/25/2017 LOV: 08/20/2017 NOV: none

## 2018-01-20 ENCOUNTER — Telehealth: Payer: Self-pay | Admitting: Internal Medicine

## 2018-01-20 DIAGNOSIS — M19012 Primary osteoarthritis, left shoulder: Secondary | ICD-10-CM | POA: Diagnosis not present

## 2018-01-22 NOTE — Telephone Encounter (Signed)
Pharmacy calling about RX stating that they had faxed over a request on the 6th and the 9th please call  [place], [place] [phone] (Phone) [phone] (Fax)

## 2018-01-22 NOTE — Telephone Encounter (Signed)
LVM to inform patient they need an appointment.

## 2018-01-22 NOTE — Telephone Encounter (Signed)
Patient needs appointment before she gets any refills

## 2018-02-24 DIAGNOSIS — M19012 Primary osteoarthritis, left shoulder: Secondary | ICD-10-CM | POA: Diagnosis not present

## 2018-03-13 ENCOUNTER — Encounter: Payer: Self-pay | Admitting: Internal Medicine

## 2018-03-17 ENCOUNTER — Other Ambulatory Visit: Payer: Self-pay | Admitting: Internal Medicine

## 2018-03-17 NOTE — Telephone Encounter (Signed)
Control database checked last refill: 02/14/2018 LOV: 08/30/2017 [id number]

## 2018-03-17 NOTE — Telephone Encounter (Signed)
Called pharmacy last refill 01/17/2018 for 60 tablets LOV: 08/30/2017 [id number]

## 2018-03-18 NOTE — Telephone Encounter (Signed)
Can you make patient an appointment. Thank you  

## 2018-03-18 NOTE — Telephone Encounter (Signed)
Will fill but needs visit for any more refills of any controlled.

## 2018-03-18 NOTE — Telephone Encounter (Signed)
Called patient to schedule. No answer and unable to leave a message due to voicemail being full.

## 2018-03-24 ENCOUNTER — Telehealth: Payer: Self-pay | Admitting: Internal Medicine

## 2018-03-25 ENCOUNTER — Encounter: Payer: Self-pay | Admitting: Internal Medicine

## 2018-03-25 MED ORDER — ESCITALOPRAM OXALATE 20 MG PO TABS: 20.0000 mg | ORAL_TABLET | Freq: Every day | ORAL | 0 refills | Status: DC

## 2018-03-25 MED ORDER — ATORVASTATIN CALCIUM 40 MG PO TABS: 40.0000 mg | ORAL_TABLET | Freq: Every day | ORAL | 0 refills | Status: DC

## 2018-03-25 NOTE — Telephone Encounter (Signed)
Pt called and scheduled med refill OV.  Pt is completely out of these medications and would like for them to be called in at this time.

## 2018-03-25 NOTE — Telephone Encounter (Signed)
Patient is request refills of refused medications- she has made an appointment. Sent for review of request.

## 2018-03-25 NOTE — Addendum Note (Signed)
Addended by: Valli Glance F on: 03/25/2018 02:10 PM   Modules accepted: Orders

## 2018-03-28 ENCOUNTER — Other Ambulatory Visit (INDEPENDENT_AMBULATORY_CARE_PROVIDER_SITE_OTHER): Payer: Medicare Other

## 2018-03-28 ENCOUNTER — Encounter: Payer: Self-pay | Admitting: Internal Medicine

## 2018-03-28 ENCOUNTER — Ambulatory Visit (INDEPENDENT_AMBULATORY_CARE_PROVIDER_SITE_OTHER): Payer: Medicare Other | Admitting: Internal Medicine

## 2018-03-28 VITALS — BP 90/60 | HR 71 | Temp 98.1°F | Ht 66.25 in | Wt 122.0 lb

## 2018-03-28 DIAGNOSIS — G894 Chronic pain syndrome: Secondary | ICD-10-CM

## 2018-03-28 DIAGNOSIS — E7841 Elevated Lipoprotein(a): Secondary | ICD-10-CM

## 2018-03-28 DIAGNOSIS — Z0000 Encounter for general adult medical examination without abnormal findings: Secondary | ICD-10-CM | POA: Diagnosis not present

## 2018-03-28 DIAGNOSIS — I10 Essential (primary) hypertension: Secondary | ICD-10-CM

## 2018-03-28 DIAGNOSIS — G4700 Insomnia, unspecified: Secondary | ICD-10-CM

## 2018-03-28 DIAGNOSIS — E559 Vitamin D deficiency, unspecified: Secondary | ICD-10-CM

## 2018-03-28 LAB — CBC
HCT: 39.1 % (ref 36.0–46.0)
HEMOGLOBIN: 12.9 g/dL (ref 12.0–15.0)
MCHC: 33 g/dL (ref 30.0–36.0)
MCV: 89 fl (ref 78.0–100.0)
PLATELETS: 222 10*3/uL (ref 150.0–400.0)
RBC: 4.39 Mil/uL (ref 3.87–5.11)
RDW: 15.1 % (ref 11.5–15.5)
WBC: 5.2 10*3/uL (ref 4.0–10.5)

## 2018-03-28 LAB — COMPREHENSIVE METABOLIC PANEL
ALT: 17 U/L (ref 0–35)
AST: 23 U/L (ref 0–37)
Albumin: 4.6 g/dL (ref 3.5–5.2)
Alkaline Phosphatase: 34 U/L — ABNORMAL LOW (ref 39–117)
BUN: 24 mg/dL — ABNORMAL HIGH (ref 6–23)
CO2: 31 mEq/L (ref 19–32)
Calcium: 10.1 mg/dL (ref 8.4–10.5)
Chloride: 100 mEq/L (ref 96–112)
Creatinine, Ser: 0.78 mg/dL (ref 0.40–1.20)
GFR: 71.53 mL/min (ref >60.00)
Glucose, Bld: 78 mg/dL (ref 70–99)
Potassium: 4.8 mEq/L (ref 3.5–5.1)
Sodium: 139 mEq/L (ref 135–145)
Total Bilirubin: 0.4 mg/dL (ref 0.2–1.2)
Total Protein: 7 g/dL (ref 6.0–8.3)

## 2018-03-28 LAB — LIPID PANEL
CHOLESTEROL: 173 mg/dL (ref 0–200)
HDL: 62.2 mg/dL (ref >39.00)
LDL Cholesterol: 96 mg/dL (ref 0–99)
NONHDL: 110.63
Total CHOL/HDL Ratio: 3
Triglycerides: 71 mg/dL (ref 0.0–149.0)
VLDL: 14.2 mg/dL (ref 0.0–40.0)

## 2018-03-28 LAB — VITAMIN D 25 HYDROXY (VIT D DEFICIENCY, FRACTURES): VITD: 59.48 ng/mL (ref 30.00–100.00)

## 2018-03-28 NOTE — Assessment & Plan Note (Signed)
Checking lipid panel and adjust lipitor 40 mg daily as needed. 

## 2018-03-28 NOTE — Assessment & Plan Note (Signed)
Reminded of risks and benefits of this including addiction, increased risk of memory changes and falls. She wishes to continue with ativan and ambien due to QOL improvement.

## 2018-03-28 NOTE — Assessment & Plan Note (Signed)
She is currently off tramadol and will remove from list. She is encouraged to use tylenol 1000 mg TID for pain. She is no longer seeing pain management.

## 2018-03-28 NOTE — Patient Instructions (Signed)
You can try taking tylenol 1000 mg 3 times per day for the pain. Let us know if this does not help enough.  Health Maintenance, Female Adopting a healthy lifestyle and getting preventive care can go a long way to promote health and wellness. Talk with your health care provider about what schedule of regular examinations is right for you. This is a good chance for you to check in with your provider about disease prevention and staying healthy. In between checkups, there are plenty of things you can do on your own. Experts have done a lot of research about which lifestyle changes and preventive measures are most likely to keep you healthy. Ask your health care provider for more information. Weight and diet Eat a healthy diet  Be sure to include plenty of vegetables, fruits, low-fat dairy products, and lean protein.  Do not eat a lot of foods high in solid fats, added sugars, or salt.  Get regular exercise. This is one of the most important things you can do for your health. ? Most adults should exercise for at least 150 minutes each week. The exercise should increase your heart rate and make you sweat (moderate-intensity exercise). ? Most adults should also do strengthening exercises at least twice a week. This is in addition to the moderate-intensity exercise. Maintain a healthy weight  Body mass index (BMI) is a measurement that can be used to identify possible weight problems. It estimates body fat based on height and weight. Your health care provider can help determine your BMI and help you achieve or maintain a healthy weight.  For females 20 years of age and older: ? A BMI below 18.5 is considered underweight. ? A BMI of 18.5 to 24.9 is normal. ? A BMI of 25 to 29.9 is considered overweight. ? A BMI of 30 and above is considered obese. Watch levels of cholesterol and blood lipids  You should start having your blood tested for lipids and cholesterol at 78 years of age, then have this  test every 5 years.  You may need to have your cholesterol levels checked more often if: ? Your lipid or cholesterol levels are high. ? You are older than 78 years of age. ? You are at high risk for heart disease. Cancer screening Lung Cancer  Lung cancer screening is recommended for adults 55-80 years old who are at high risk for lung cancer because of a history of smoking.  A yearly low-dose CT scan of the lungs is recommended for people who: ? Currently smoke. ? Have quit within the past 15 years. ? Have at least a 30-pack-year history of smoking. A pack year is smoking an average of one pack of cigarettes a day for 1 year.  Yearly screening should continue until it has been 15 years since you quit.  Yearly screening should stop if you develop a health problem that would prevent you from having lung cancer treatment. Breast Cancer  Practice breast self-awareness. This means understanding how your breasts normally appear and feel.  It also means doing regular breast self-exams. Let your health care provider know about any changes, no matter how small.  If you are in your 20s or 30s, you should have a clinical breast exam (CBE) by a health care provider every 1-3 years as part of a regular health exam.  If you are 40 or older, have a CBE every year. Also consider having a breast X-ray (mammogram) every year.  If you have a family   history of breast cancer, talk to your health care provider about genetic screening.  If you are at high risk for breast cancer, talk to your health care provider about having an MRI and a mammogram every year.  Breast cancer gene (BRCA) assessment is recommended for women who have family members with BRCA-related cancers. BRCA-related cancers include: ? Breast. ? Ovarian. ? Tubal. ? Peritoneal cancers.  Results of the assessment will determine the need for genetic counseling and BRCA1 and BRCA2 testing. Cervical Cancer Your health care provider may  recommend that you be screened regularly for cancer of the pelvic organs (ovaries, uterus, and vagina). This screening involves a pelvic examination, including checking for microscopic changes to the surface of your cervix (Pap test). You may be encouraged to have this screening done every 3 years, beginning at age 21.  For women ages 30-65, health care providers may recommend pelvic exams and Pap testing every 3 years, or they may recommend the Pap and pelvic exam, combined with testing for human papilloma virus (HPV), every 5 years. Some types of HPV increase your risk of cervical cancer. Testing for HPV may also be done on women of any age with unclear Pap test results.  Other health care providers may not recommend any screening for nonpregnant women who are considered low risk for pelvic cancer and who do not have symptoms. Ask your health care provider if a screening pelvic exam is right for you.  If you have had past treatment for cervical cancer or a condition that could lead to cancer, you need Pap tests and screening for cancer for at least 20 years after your treatment. If Pap tests have been discontinued, your risk factors (such as having a new sexual partner) need to be reassessed to determine if screening should resume. Some women have medical problems that increase the chance of getting cervical cancer. In these cases, your health care provider may recommend more frequent screening and Pap tests. Colorectal Cancer  This type of cancer can be detected and often prevented.  Routine colorectal cancer screening usually begins at 78 years of age and continues through 78 years of age.  Your health care provider may recommend screening at an earlier age if you have risk factors for colon cancer.  Your health care provider may also recommend using home test kits to check for hidden blood in the stool.  A small camera at the end of a tube can be used to examine your colon directly  (sigmoidoscopy or colonoscopy). This is done to check for the earliest forms of colorectal cancer.  Routine screening usually begins at age 50.  Direct examination of the colon should be repeated every 5-10 years through 78 years of age. However, you may need to be screened more often if early forms of precancerous polyps or small growths are found. Skin Cancer  Check your skin from head to toe regularly.  Tell your health care provider about any new moles or changes in moles, especially if there is a change in a mole's shape or color.  Also tell your health care provider if you have a mole that is larger than the size of a pencil eraser.  Always use sunscreen. Apply sunscreen liberally and repeatedly throughout the day.  Protect yourself by wearing long sleeves, pants, a wide-brimmed hat, and sunglasses whenever you are outside. Heart disease, diabetes, and high blood pressure  High blood pressure causes heart disease and increases the risk of stroke. High blood pressure is   more likely to develop in: ? People who have blood pressure in the high end of the normal range (130-139/85-89 mm Hg). ? People who are overweight or obese. ? People who are African American.  If you are 68-51 years of age, have your blood pressure checked every 3-5 years. If you are 46 years of age or older, have your blood pressure checked every year. You should have your blood pressure measured twice-once when you are at a hospital or clinic, and once when you are not at a hospital or clinic. Record the average of the two measurements. To check your blood pressure when you are not at a hospital or clinic, you can use: ? An automated blood pressure machine at a pharmacy. ? A home blood pressure monitor.  If you are between 35 years and 16 years old, ask your health care provider if you should take aspirin to prevent strokes.  Have regular diabetes screenings. This involves taking a blood sample to check your  fasting blood sugar level. ? If you are at a normal weight and have a low risk for diabetes, have this test once every three years after 78 years of age. ? If you are overweight and have a high risk for diabetes, consider being tested at a younger age or more often. Preventing infection Hepatitis B  If you have a higher risk for hepatitis B, you should be screened for this virus. You are considered at high risk for hepatitis B if: ? You were born in a country where hepatitis B is common. Ask your health care provider which countries are considered high risk. ? Your parents were born in a high-risk country, and you have not been immunized against hepatitis B (hepatitis B vaccine). ? You have HIV or AIDS. ? You use needles to inject street drugs. ? You live with someone who has hepatitis B. ? You have had sex with someone who has hepatitis B. ? You get hemodialysis treatment. ? You take certain medicines for conditions, including cancer, organ transplantation, and autoimmune conditions. Hepatitis C  Blood testing is recommended for: ? Everyone born from [birth year] through 1965. ? Anyone with known risk factors for hepatitis C. Sexually transmitted infections (STIs)  You should be screened for sexually transmitted infections (STIs) including gonorrhea and chlamydia if: ? You are sexually active and are younger than 78 years of age. ? You are older than 78 years of age and your health care provider tells you that you are at risk for this type of infection. ? Your sexual activity has changed since you were last screened and you are at an increased risk for chlamydia or gonorrhea. Ask your health care provider if you are at risk.  If you do not have HIV, but are at risk, it may be recommended that you take a prescription medicine daily to prevent HIV infection. This is called pre-exposure prophylaxis (PrEP). You are considered at risk if: ? You are sexually active and do not regularly use condoms or  know the HIV status of your partner(s). ? You take drugs by injection. ? You are sexually active with a partner who has HIV. Talk with your health care provider about whether you are at high risk of being infected with HIV. If you choose to begin PrEP, you should first be tested for HIV. You should then be tested every 3 months for as long as you are taking PrEP. Pregnancy  If you are premenopausal and you may become pregnant,  ask your health care provider about preconception counseling.  If you may become pregnant, take 400 to 800 micrograms (mcg) of folic acid every day.  If you want to prevent pregnancy, talk to your health care provider about birth control (contraception). Osteoporosis and menopause  Osteoporosis is a disease in which the bones lose minerals and strength with aging. This can result in serious bone fractures. Your risk for osteoporosis can be identified using a bone density scan.  If you are 67 years of age or older, or if you are at risk for osteoporosis and fractures, ask your health care provider if you should be screened.  Ask your health care provider whether you should take a calcium or vitamin D supplement to lower your risk for osteoporosis.  Menopause may have certain physical symptoms and risks.  Hormone replacement therapy may reduce some of these symptoms and risks. Talk to your health care provider about whether hormone replacement therapy is right for you. Follow these instructions at home:  Schedule regular health, dental, and eye exams.  Stay current with your immunizations.  Do not use any tobacco products including cigarettes, chewing tobacco, or electronic cigarettes.  If you are pregnant, do not drink alcohol.  If you are breastfeeding, limit how much and how often you drink alcohol.  Limit alcohol intake to no more than 1 drink per day for nonpregnant women. One drink equals 12 ounces of beer, 5 ounces of wine, or 1 ounces of hard  liquor.  Do not use street drugs.  Do not share needles.  Ask your health care provider for help if you need support or information about quitting drugs.  Tell your health care provider if you often feel depressed.  Tell your health care provider if you have ever been abused or do not feel safe at home. This information is not intended to replace advice given to you by your health care provider. Make sure you discuss any questions you have with your health care provider. Document Released: 08/14/2010 Document Revised: 07/07/2015 Document Reviewed: 11/02/2014 Elsevier Interactive Patient Education  2019 [employer].

## 2018-03-28 NOTE — Assessment & Plan Note (Signed)
BP at goal off meds. Checking CMP and adjust as needed. 

## 2018-03-28 NOTE — Progress Notes (Signed)
Subjective:   Patient ID: Hailey [name], female    DOB: [birth year], 78 y.o.   MRN: [id number]  HPI  Here for medicare wellness, no new complaints. Please see A/P for status and treatment of chronic medical problems.   HPI #2: Here for follow up insomnia (uses ambien nightly, sometimes taking lorazepam as well, also is taking zzquil and melatonin also, sometimes still has problems sleeping) and cholesterol (taking lipitor 40 mg daily, denies chest pains or stroke symptoms, denies side effects), and chronic pain (feeling improved since shoulder replacement as she is now more mobile left shoulder and chronic pain there is mostly gone, she does still have chronic pain in her back, did not come for visit so has not had tramadol in some months, she is taking tylenol 500 mg BID which is not helping much, she is still taking hemp pills, would like to try staying off tramadol but needs to know safe limits on tylenol)  Diet: heart healthy Physical activity: sedentary Depression/mood screen: negative Hearing: intact to whispered voice Visual acuity: grossly normal, performs annual eye exam  ADLs: capable Fall risk: none Home safety: good Cognitive evaluation: intact to orientation, naming, recall and repetition EOL planning: adv directives discussed    Office Visit from 03/28/2018 in [place]  PHQ-2 Total Score  0        Clinical Support from 01/28/2017 in [place]  PHQ-9 Total Score  1     I have personally reviewed and have noted 1. The patient's medical and social history - reviewed today no changes 2. Their use of alcohol, tobacco or illicit drugs 3. Their current medications and supplements 4. The patient's functional ability including ADL's, fall risks, home safety risks and hearing or visual impairment. 5. Diet and physical activities 6. Evidence for depression or mood disorders 7. Care team reviewed and updated (available in  snapshot)  Fax [phone] to [place]  Review of Systems  Constitutional: Negative.   HENT: Negative.   Eyes: Negative.   Respiratory: Negative for cough, chest tightness and shortness of breath.   Cardiovascular: Negative for chest pain, palpitations and leg swelling.  Gastrointestinal: Negative for abdominal distention, abdominal pain, constipation, diarrhea, nausea and vomiting.  Musculoskeletal: Positive for arthralgias and back pain.  Skin: Negative.   Neurological: Negative.   Psychiatric/Behavioral: Negative.     Objective:  Physical Exam Constitutional:      Appearance: She is well-developed.  HENT:     Head: Normocephalic and atraumatic.  Neck:     Musculoskeletal: Normal range of motion.  Cardiovascular:     Rate and Rhythm: Normal rate and regular rhythm.  Pulmonary:     Effort: Pulmonary effort is normal. No respiratory distress.     Breath sounds: Normal breath sounds. No wheezing or rales.  Abdominal:     General: Bowel sounds are normal. There is no distension.     Palpations: Abdomen is soft.     Tenderness: There is no abdominal tenderness. There is no rebound.  Musculoskeletal:        General: Tenderness present.  Skin:    General: Skin is warm and dry.  Neurological:     Mental Status: She is alert and oriented to person, place, and time.     Coordination: Coordination normal.     Vitals:   03/28/18 1506  BP: 90/60  Pulse: 71  Temp: 98.1 F (36.7 C)  TempSrc: Oral  SpO2: 98%  Weight:  122 lb (55.3 kg)  Height: 5' 6.25" (1.683 m)    Assessment & Plan:

## 2018-03-29 NOTE — Assessment & Plan Note (Signed)
Flu shot up to date. Pneumonia complete. Shingrix counseled. Tetanus up to date. Colonoscopy up to date and aged out before recall. Mammogram up to date, pap smear aged out and dexa up to date. Counseled about sun safety and mole surveillance. Counseled about the dangers of distracted driving. Given 10 year screening recommendations.

## 2018-04-03 ENCOUNTER — Other Ambulatory Visit: Payer: Self-pay | Admitting: Internal Medicine

## 2018-04-03 ENCOUNTER — Telehealth: Payer: Self-pay | Admitting: Internal Medicine

## 2018-04-03 MED ORDER — HYDROCORTISONE 2.5 % RE CREA: 1.0000 "application " | TOPICAL_CREAM | Freq: Two times a day (BID) | RECTAL | 0 refills | Status: DC

## 2018-04-03 MED ORDER — OMEPRAZOLE 20 MG PO CPDR: 20.0000 mg | DELAYED_RELEASE_CAPSULE | Freq: Every day | ORAL | 3 refills | Status: DC

## 2018-04-03 MED ORDER — TRAMADOL-ACETAMINOPHEN 37.5-325 MG PO TABS: 1.0000 | ORAL_TABLET | Freq: Two times a day (BID) | ORAL | 0 refills | Status: DC | PRN

## 2018-04-03 NOTE — Addendum Note (Signed)
Addended by: Pricilla Holm A on: 04/03/2018 01:56 PM   Modules accepted: Orders

## 2018-04-03 NOTE — Telephone Encounter (Signed)
Sent in tramadol for BID prn and she should still use tylenol in between to help with pain and only tramadol when needed.

## 2018-04-03 NOTE — Telephone Encounter (Signed)
Copied from [place] [phone]. Topic: General - Other >> Apr 03, 2018  1:14 PM Windy Kalata wrote: Reason for CRM: patient states she saw Dr. Sharlet Salina last week and she told her that is she needed a script for Tramadol to let her know, she states the Tylenol is not working for her and she also needs a script for acid reflux as well, also she needs the hemmroid cream they discussed.  Best call back is [phone]

## 2018-04-03 NOTE — Telephone Encounter (Signed)
Patient informed of MD response  

## 2018-04-15 ENCOUNTER — Other Ambulatory Visit: Payer: Self-pay | Admitting: Internal Medicine

## 2018-04-15 NOTE — Telephone Encounter (Signed)
Control database checked last refill: 03/18/2018 LOV: 03/28/2018 cpe NOV: none

## 2018-04-21 ENCOUNTER — Ambulatory Visit: Payer: Self-pay | Admitting: Internal Medicine

## 2018-04-23 DIAGNOSIS — M810 Age-related osteoporosis without current pathological fracture: Secondary | ICD-10-CM | POA: Diagnosis not present

## 2018-04-23 DIAGNOSIS — M19012 Primary osteoarthritis, left shoulder: Secondary | ICD-10-CM | POA: Diagnosis not present

## 2018-04-25 ENCOUNTER — Other Ambulatory Visit: Payer: Self-pay | Admitting: Internal Medicine

## 2018-05-15 ENCOUNTER — Other Ambulatory Visit: Payer: Self-pay | Admitting: Internal Medicine

## 2018-05-15 NOTE — Telephone Encounter (Signed)
Control database checked last refill: 04/03/2018 LOV: 03/28/2018 cpe NOV: none

## 2018-05-16 ENCOUNTER — Encounter: Payer: Self-pay | Admitting: *Deleted

## 2018-06-14 ENCOUNTER — Other Ambulatory Visit: Payer: Self-pay | Admitting: Internal Medicine

## 2018-06-16 NOTE — Telephone Encounter (Signed)
Control database checked last refill: 05/16/2018 LOV: 03/28/2018 CPE [id number]

## 2018-06-19 ENCOUNTER — Other Ambulatory Visit: Payer: Self-pay | Admitting: Internal Medicine

## 2018-07-15 ENCOUNTER — Other Ambulatory Visit: Payer: Self-pay | Admitting: Internal Medicine

## 2018-07-15 NOTE — Telephone Encounter (Signed)
Control database checked last refill: 06/14/2018 LOV:03/28/2018 cpe NOV: none

## 2018-08-06 DIAGNOSIS — M545 Low back pain: Secondary | ICD-10-CM | POA: Diagnosis not present

## 2018-08-07 ENCOUNTER — Other Ambulatory Visit: Payer: Self-pay | Admitting: Internal Medicine

## 2018-08-18 ENCOUNTER — Other Ambulatory Visit: Payer: Self-pay | Admitting: Internal Medicine

## 2018-08-19 DIAGNOSIS — G959 Disease of spinal cord, unspecified: Secondary | ICD-10-CM | POA: Diagnosis not present

## 2018-08-19 DIAGNOSIS — M5134 Other intervertebral disc degeneration, thoracic region: Secondary | ICD-10-CM | POA: Diagnosis not present

## 2018-08-19 DIAGNOSIS — M4854XD Collapsed vertebra, not elsewhere classified, thoracic region, subsequent encounter for fracture with routine healing: Secondary | ICD-10-CM | POA: Diagnosis not present

## 2018-08-19 DIAGNOSIS — M4324 Fusion of spine, thoracic region: Secondary | ICD-10-CM | POA: Diagnosis not present

## 2018-08-19 DIAGNOSIS — S22088D Other fracture of T11-T12 vertebra, subsequent encounter for fracture with routine healing: Secondary | ICD-10-CM | POA: Diagnosis not present

## 2018-08-19 NOTE — Telephone Encounter (Signed)
Control database checked last refill:07/15/2018 LOV:03/28/2018 cpe [id number]

## 2018-08-20 DIAGNOSIS — M4803 Spinal stenosis, cervicothoracic region: Secondary | ICD-10-CM | POA: Diagnosis not present

## 2018-08-20 DIAGNOSIS — M5124 Other intervertebral disc displacement, thoracic region: Secondary | ICD-10-CM | POA: Diagnosis not present

## 2018-09-03 DIAGNOSIS — M4321 Fusion of spine, occipito-atlanto-axial region: Secondary | ICD-10-CM | POA: Diagnosis not present

## 2018-09-03 DIAGNOSIS — M40204 Unspecified kyphosis, thoracic region: Secondary | ICD-10-CM | POA: Diagnosis not present

## 2018-09-03 DIAGNOSIS — G992 Myelopathy in diseases classified elsewhere: Secondary | ICD-10-CM | POA: Diagnosis not present

## 2018-09-03 DIAGNOSIS — M40294 Other kyphosis, thoracic region: Secondary | ICD-10-CM | POA: Diagnosis not present

## 2018-09-03 DIAGNOSIS — Z981 Arthrodesis status: Secondary | ICD-10-CM | POA: Diagnosis not present

## 2018-09-03 DIAGNOSIS — M4804 Spinal stenosis, thoracic region: Secondary | ICD-10-CM | POA: Diagnosis not present

## 2018-09-03 DIAGNOSIS — R262 Difficulty in walking, not elsewhere classified: Secondary | ICD-10-CM | POA: Diagnosis not present

## 2018-09-11 DIAGNOSIS — M4014 Other secondary kyphosis, thoracic region: Secondary | ICD-10-CM | POA: Diagnosis not present

## 2018-09-11 DIAGNOSIS — M4800 Spinal stenosis, site unspecified: Secondary | ICD-10-CM | POA: Diagnosis not present

## 2018-09-11 DIAGNOSIS — Z01812 Encounter for preprocedural laboratory examination: Secondary | ICD-10-CM | POA: Diagnosis not present

## 2018-09-11 DIAGNOSIS — M4324 Fusion of spine, thoracic region: Secondary | ICD-10-CM | POA: Diagnosis not present

## 2018-09-11 DIAGNOSIS — Z1159 Encounter for screening for other viral diseases: Secondary | ICD-10-CM | POA: Diagnosis not present

## 2018-09-15 DIAGNOSIS — R9431 Abnormal electrocardiogram [ECG] [EKG]: Secondary | ICD-10-CM | POA: Diagnosis not present

## 2018-09-18 DIAGNOSIS — M40294 Other kyphosis, thoracic region: Secondary | ICD-10-CM | POA: Diagnosis not present

## 2018-09-18 DIAGNOSIS — M4014 Other secondary kyphosis, thoracic region: Secondary | ICD-10-CM | POA: Diagnosis not present

## 2018-09-18 DIAGNOSIS — M4324 Fusion of spine, thoracic region: Secondary | ICD-10-CM | POA: Diagnosis not present

## 2018-09-18 DIAGNOSIS — D62 Acute posthemorrhagic anemia: Secondary | ICD-10-CM | POA: Diagnosis not present

## 2018-09-18 DIAGNOSIS — I951 Orthostatic hypotension: Secondary | ICD-10-CM | POA: Diagnosis not present

## 2018-09-18 DIAGNOSIS — I10 Essential (primary) hypertension: Secondary | ICD-10-CM | POA: Diagnosis present

## 2018-09-18 DIAGNOSIS — F329 Major depressive disorder, single episode, unspecified: Secondary | ICD-10-CM | POA: Diagnosis present

## 2018-09-18 DIAGNOSIS — T84226A Displacement of internal fixation device of vertebrae, initial encounter: Secondary | ICD-10-CM | POA: Diagnosis present

## 2018-09-18 DIAGNOSIS — Z472 Encounter for removal of internal fixation device: Secondary | ICD-10-CM | POA: Diagnosis not present

## 2018-09-18 DIAGNOSIS — Z87891 Personal history of nicotine dependence: Secondary | ICD-10-CM | POA: Diagnosis not present

## 2018-09-18 DIAGNOSIS — M546 Pain in thoracic spine: Secondary | ICD-10-CM | POA: Diagnosis not present

## 2018-09-18 DIAGNOSIS — K219 Gastro-esophageal reflux disease without esophagitis: Secondary | ICD-10-CM | POA: Diagnosis present

## 2018-09-18 DIAGNOSIS — G4700 Insomnia, unspecified: Secondary | ICD-10-CM | POA: Diagnosis present

## 2018-09-18 DIAGNOSIS — M542 Cervicalgia: Secondary | ICD-10-CM | POA: Diagnosis not present

## 2018-09-18 DIAGNOSIS — Z981 Arthrodesis status: Secondary | ICD-10-CM | POA: Diagnosis not present

## 2018-09-25 ENCOUNTER — Telehealth: Payer: Self-pay

## 2018-09-25 ENCOUNTER — Telehealth: Payer: Self-pay | Admitting: *Deleted

## 2018-09-25 DIAGNOSIS — Z4801 Encounter for change or removal of surgical wound dressing: Secondary | ICD-10-CM | POA: Diagnosis not present

## 2018-09-25 DIAGNOSIS — F419 Anxiety disorder, unspecified: Secondary | ICD-10-CM | POA: Diagnosis not present

## 2018-09-25 DIAGNOSIS — M5137 Other intervertebral disc degeneration, lumbosacral region: Secondary | ICD-10-CM | POA: Diagnosis not present

## 2018-09-25 DIAGNOSIS — Z4789 Encounter for other orthopedic aftercare: Secondary | ICD-10-CM | POA: Diagnosis not present

## 2018-09-25 DIAGNOSIS — F329 Major depressive disorder, single episode, unspecified: Secondary | ICD-10-CM | POA: Diagnosis not present

## 2018-09-25 DIAGNOSIS — J3089 Other allergic rhinitis: Secondary | ICD-10-CM | POA: Diagnosis not present

## 2018-09-25 DIAGNOSIS — M810 Age-related osteoporosis without current pathological fracture: Secondary | ICD-10-CM | POA: Diagnosis not present

## 2018-09-25 DIAGNOSIS — J439 Emphysema, unspecified: Secondary | ICD-10-CM | POA: Diagnosis not present

## 2018-09-25 DIAGNOSIS — E871 Hypo-osmolality and hyponatremia: Secondary | ICD-10-CM | POA: Diagnosis not present

## 2018-09-25 DIAGNOSIS — E785 Hyperlipidemia, unspecified: Secondary | ICD-10-CM | POA: Diagnosis not present

## 2018-09-25 DIAGNOSIS — M21379 Foot drop, unspecified foot: Secondary | ICD-10-CM | POA: Diagnosis not present

## 2018-09-25 DIAGNOSIS — M79606 Pain in leg, unspecified: Secondary | ICD-10-CM | POA: Diagnosis not present

## 2018-09-25 DIAGNOSIS — M4804 Spinal stenosis, thoracic region: Secondary | ICD-10-CM | POA: Diagnosis not present

## 2018-09-25 DIAGNOSIS — M4015 Other secondary kyphosis, thoracolumbar region: Secondary | ICD-10-CM | POA: Diagnosis not present

## 2018-09-25 DIAGNOSIS — I10 Essential (primary) hypertension: Secondary | ICD-10-CM | POA: Diagnosis not present

## 2018-09-25 DIAGNOSIS — J302 Other seasonal allergic rhinitis: Secondary | ICD-10-CM | POA: Diagnosis not present

## 2018-09-25 DIAGNOSIS — I77819 Aortic ectasia, unspecified site: Secondary | ICD-10-CM | POA: Diagnosis not present

## 2018-09-25 DIAGNOSIS — G894 Chronic pain syndrome: Secondary | ICD-10-CM | POA: Diagnosis not present

## 2018-09-25 DIAGNOSIS — I2510 Atherosclerotic heart disease of native coronary artery without angina pectoris: Secondary | ICD-10-CM | POA: Diagnosis not present

## 2018-09-25 DIAGNOSIS — K219 Gastro-esophageal reflux disease without esophagitis: Secondary | ICD-10-CM | POA: Diagnosis not present

## 2018-09-25 DIAGNOSIS — G4700 Insomnia, unspecified: Secondary | ICD-10-CM | POA: Diagnosis not present

## 2018-09-25 DIAGNOSIS — G4733 Obstructive sleep apnea (adult) (pediatric): Secondary | ICD-10-CM | POA: Diagnosis not present

## 2018-09-25 DIAGNOSIS — M5030 Other cervical disc degeneration, unspecified cervical region: Secondary | ICD-10-CM | POA: Diagnosis not present

## 2018-09-25 DIAGNOSIS — E7800 Pure hypercholesterolemia, unspecified: Secondary | ICD-10-CM | POA: Diagnosis not present

## 2018-09-25 DIAGNOSIS — I959 Hypotension, unspecified: Secondary | ICD-10-CM | POA: Diagnosis not present

## 2018-09-25 NOTE — Telephone Encounter (Signed)
I cannot as there is no face to face within last 3 months and I do not know what she needs home health for.

## 2018-09-25 NOTE — Telephone Encounter (Signed)
Gave MD response. Patient had a surgical repair of the kyphosis they are going to call the surgical provider Dr. Harl Bowie to see if they will sign off on the orders

## 2018-09-25 NOTE — Telephone Encounter (Signed)
Called Patient due to her being discharged from [facility]. Patient had fusion of spine, thoracic region performed by Dr. Jenne Campus. Patient states that her discharge follow-ups are all surgical, she has good support at home and has no discharge questions or concerns at this time.

## 2018-09-25 NOTE — Telephone Encounter (Signed)
Copied from [place] [phone]. Topic: General - Other >> Sep 25, 2018  2:42 PM Yvette Rack wrote: Reason for CRM: Nira Conn with [facility] called to ask if Dr. Sharlet Salina will be signing home health care orders. Cb# [phone]

## 2018-09-26 DIAGNOSIS — M5137 Other intervertebral disc degeneration, lumbosacral region: Secondary | ICD-10-CM | POA: Diagnosis not present

## 2018-09-26 DIAGNOSIS — Z4789 Encounter for other orthopedic aftercare: Secondary | ICD-10-CM | POA: Diagnosis not present

## 2018-09-26 DIAGNOSIS — M4015 Other secondary kyphosis, thoracolumbar region: Secondary | ICD-10-CM | POA: Diagnosis not present

## 2018-09-26 DIAGNOSIS — M5030 Other cervical disc degeneration, unspecified cervical region: Secondary | ICD-10-CM | POA: Diagnosis not present

## 2018-09-26 DIAGNOSIS — M4804 Spinal stenosis, thoracic region: Secondary | ICD-10-CM | POA: Diagnosis not present

## 2018-09-26 DIAGNOSIS — Z4801 Encounter for change or removal of surgical wound dressing: Secondary | ICD-10-CM | POA: Diagnosis not present

## 2018-09-30 DIAGNOSIS — Z4801 Encounter for change or removal of surgical wound dressing: Secondary | ICD-10-CM | POA: Diagnosis not present

## 2018-09-30 DIAGNOSIS — M5030 Other cervical disc degeneration, unspecified cervical region: Secondary | ICD-10-CM | POA: Diagnosis not present

## 2018-09-30 DIAGNOSIS — M5137 Other intervertebral disc degeneration, lumbosacral region: Secondary | ICD-10-CM | POA: Diagnosis not present

## 2018-09-30 DIAGNOSIS — M4015 Other secondary kyphosis, thoracolumbar region: Secondary | ICD-10-CM | POA: Diagnosis not present

## 2018-09-30 DIAGNOSIS — Z4789 Encounter for other orthopedic aftercare: Secondary | ICD-10-CM | POA: Diagnosis not present

## 2018-09-30 DIAGNOSIS — M4804 Spinal stenosis, thoracic region: Secondary | ICD-10-CM | POA: Diagnosis not present

## 2018-10-01 DIAGNOSIS — Z4789 Encounter for other orthopedic aftercare: Secondary | ICD-10-CM | POA: Diagnosis not present

## 2018-10-01 DIAGNOSIS — Z981 Arthrodesis status: Secondary | ICD-10-CM | POA: Diagnosis not present

## 2018-10-01 DIAGNOSIS — Z4802 Encounter for removal of sutures: Secondary | ICD-10-CM | POA: Diagnosis not present

## 2018-10-02 DIAGNOSIS — M5137 Other intervertebral disc degeneration, lumbosacral region: Secondary | ICD-10-CM | POA: Diagnosis not present

## 2018-10-02 DIAGNOSIS — M4015 Other secondary kyphosis, thoracolumbar region: Secondary | ICD-10-CM | POA: Diagnosis not present

## 2018-10-02 DIAGNOSIS — M4804 Spinal stenosis, thoracic region: Secondary | ICD-10-CM | POA: Diagnosis not present

## 2018-10-02 DIAGNOSIS — Z4801 Encounter for change or removal of surgical wound dressing: Secondary | ICD-10-CM | POA: Diagnosis not present

## 2018-10-02 DIAGNOSIS — Z4789 Encounter for other orthopedic aftercare: Secondary | ICD-10-CM | POA: Diagnosis not present

## 2018-10-02 DIAGNOSIS — M5030 Other cervical disc degeneration, unspecified cervical region: Secondary | ICD-10-CM | POA: Diagnosis not present

## 2018-10-03 DIAGNOSIS — M5030 Other cervical disc degeneration, unspecified cervical region: Secondary | ICD-10-CM | POA: Diagnosis not present

## 2018-10-03 DIAGNOSIS — M4804 Spinal stenosis, thoracic region: Secondary | ICD-10-CM | POA: Diagnosis not present

## 2018-10-03 DIAGNOSIS — M4015 Other secondary kyphosis, thoracolumbar region: Secondary | ICD-10-CM | POA: Diagnosis not present

## 2018-10-03 DIAGNOSIS — Z4801 Encounter for change or removal of surgical wound dressing: Secondary | ICD-10-CM | POA: Diagnosis not present

## 2018-10-03 DIAGNOSIS — Z4789 Encounter for other orthopedic aftercare: Secondary | ICD-10-CM | POA: Diagnosis not present

## 2018-10-03 DIAGNOSIS — M5137 Other intervertebral disc degeneration, lumbosacral region: Secondary | ICD-10-CM | POA: Diagnosis not present

## 2018-10-07 DIAGNOSIS — M5137 Other intervertebral disc degeneration, lumbosacral region: Secondary | ICD-10-CM | POA: Diagnosis not present

## 2018-10-07 DIAGNOSIS — M4804 Spinal stenosis, thoracic region: Secondary | ICD-10-CM | POA: Diagnosis not present

## 2018-10-07 DIAGNOSIS — Z4801 Encounter for change or removal of surgical wound dressing: Secondary | ICD-10-CM | POA: Diagnosis not present

## 2018-10-07 DIAGNOSIS — M4015 Other secondary kyphosis, thoracolumbar region: Secondary | ICD-10-CM | POA: Diagnosis not present

## 2018-10-07 DIAGNOSIS — M5030 Other cervical disc degeneration, unspecified cervical region: Secondary | ICD-10-CM | POA: Diagnosis not present

## 2018-10-07 DIAGNOSIS — Z4789 Encounter for other orthopedic aftercare: Secondary | ICD-10-CM | POA: Diagnosis not present

## 2018-10-13 DIAGNOSIS — M4015 Other secondary kyphosis, thoracolumbar region: Secondary | ICD-10-CM | POA: Diagnosis not present

## 2018-10-13 DIAGNOSIS — M5137 Other intervertebral disc degeneration, lumbosacral region: Secondary | ICD-10-CM | POA: Diagnosis not present

## 2018-10-13 DIAGNOSIS — Z4789 Encounter for other orthopedic aftercare: Secondary | ICD-10-CM | POA: Diagnosis not present

## 2018-10-13 DIAGNOSIS — Z4801 Encounter for change or removal of surgical wound dressing: Secondary | ICD-10-CM | POA: Diagnosis not present

## 2018-10-13 DIAGNOSIS — M4804 Spinal stenosis, thoracic region: Secondary | ICD-10-CM | POA: Diagnosis not present

## 2018-10-13 DIAGNOSIS — M5030 Other cervical disc degeneration, unspecified cervical region: Secondary | ICD-10-CM | POA: Diagnosis not present

## 2018-10-17 DIAGNOSIS — M4804 Spinal stenosis, thoracic region: Secondary | ICD-10-CM | POA: Diagnosis not present

## 2018-10-17 DIAGNOSIS — M4015 Other secondary kyphosis, thoracolumbar region: Secondary | ICD-10-CM | POA: Diagnosis not present

## 2018-10-17 DIAGNOSIS — M5137 Other intervertebral disc degeneration, lumbosacral region: Secondary | ICD-10-CM | POA: Diagnosis not present

## 2018-10-17 DIAGNOSIS — M5030 Other cervical disc degeneration, unspecified cervical region: Secondary | ICD-10-CM | POA: Diagnosis not present

## 2018-10-17 DIAGNOSIS — Z4801 Encounter for change or removal of surgical wound dressing: Secondary | ICD-10-CM | POA: Diagnosis not present

## 2018-10-17 DIAGNOSIS — Z4789 Encounter for other orthopedic aftercare: Secondary | ICD-10-CM | POA: Diagnosis not present

## 2018-10-21 DIAGNOSIS — M5030 Other cervical disc degeneration, unspecified cervical region: Secondary | ICD-10-CM | POA: Diagnosis not present

## 2018-10-21 DIAGNOSIS — M4015 Other secondary kyphosis, thoracolumbar region: Secondary | ICD-10-CM | POA: Diagnosis not present

## 2018-10-21 DIAGNOSIS — M5137 Other intervertebral disc degeneration, lumbosacral region: Secondary | ICD-10-CM | POA: Diagnosis not present

## 2018-10-21 DIAGNOSIS — Z4801 Encounter for change or removal of surgical wound dressing: Secondary | ICD-10-CM | POA: Diagnosis not present

## 2018-10-21 DIAGNOSIS — M4804 Spinal stenosis, thoracic region: Secondary | ICD-10-CM | POA: Diagnosis not present

## 2018-10-21 DIAGNOSIS — Z4789 Encounter for other orthopedic aftercare: Secondary | ICD-10-CM | POA: Diagnosis not present

## 2018-10-24 DIAGNOSIS — Z981 Arthrodesis status: Secondary | ICD-10-CM | POA: Diagnosis not present

## 2018-10-24 DIAGNOSIS — Z09 Encounter for follow-up examination after completed treatment for conditions other than malignant neoplasm: Secondary | ICD-10-CM | POA: Diagnosis not present

## 2018-10-28 ENCOUNTER — Other Ambulatory Visit: Payer: Self-pay | Admitting: Internal Medicine

## 2018-10-28 ENCOUNTER — Other Ambulatory Visit: Payer: Self-pay

## 2018-10-28 MED ORDER — ESCITALOPRAM OXALATE 20 MG PO TABS: 20.0000 mg | ORAL_TABLET | Freq: Every day | ORAL | 1 refills | Status: DC

## 2018-11-20 ENCOUNTER — Other Ambulatory Visit: Payer: Self-pay | Admitting: Internal Medicine

## 2018-11-20 NOTE — Telephone Encounter (Signed)
Control database checked last refill: 10/16/2018 30 tabs LOV:03/28/2018 cpe NOV: none

## 2018-11-26 DIAGNOSIS — E559 Vitamin D deficiency, unspecified: Secondary | ICD-10-CM | POA: Diagnosis not present

## 2018-11-26 DIAGNOSIS — M810 Age-related osteoporosis without current pathological fracture: Secondary | ICD-10-CM | POA: Diagnosis not present

## 2018-11-26 DIAGNOSIS — R5383 Other fatigue: Secondary | ICD-10-CM | POA: Diagnosis not present

## 2018-11-28 ENCOUNTER — Ambulatory Visit (INDEPENDENT_AMBULATORY_CARE_PROVIDER_SITE_OTHER): Payer: Medicare Other

## 2018-11-28 ENCOUNTER — Other Ambulatory Visit: Payer: Self-pay

## 2018-11-28 DIAGNOSIS — Z23 Encounter for immunization: Secondary | ICD-10-CM

## 2018-12-03 DIAGNOSIS — M810 Age-related osteoporosis without current pathological fracture: Secondary | ICD-10-CM | POA: Diagnosis not present

## 2018-12-03 DIAGNOSIS — M545 Low back pain: Secondary | ICD-10-CM | POA: Diagnosis not present

## 2018-12-17 DIAGNOSIS — T84296A Other mechanical complication of internal fixation device of vertebrae, initial encounter: Secondary | ICD-10-CM | POA: Diagnosis not present

## 2018-12-17 DIAGNOSIS — M4014 Other secondary kyphosis, thoracic region: Secondary | ICD-10-CM | POA: Diagnosis not present

## 2018-12-17 DIAGNOSIS — M4324 Fusion of spine, thoracic region: Secondary | ICD-10-CM | POA: Diagnosis not present

## 2018-12-17 DIAGNOSIS — Z9889 Other specified postprocedural states: Secondary | ICD-10-CM | POA: Diagnosis not present

## 2018-12-17 DIAGNOSIS — Z981 Arthrodesis status: Secondary | ICD-10-CM | POA: Diagnosis not present

## 2018-12-20 ENCOUNTER — Other Ambulatory Visit: Payer: Self-pay | Admitting: Internal Medicine

## 2018-12-22 ENCOUNTER — Other Ambulatory Visit: Payer: Self-pay | Admitting: Internal Medicine

## 2018-12-22 NOTE — Telephone Encounter (Signed)
This should still have a refill and does not need refill until next month.

## 2018-12-22 NOTE — Telephone Encounter (Signed)
Control database checked last refill: 11/20/2018 60 tabs LOV: 03/28/2018 cpe [id number]

## 2018-12-22 NOTE — Telephone Encounter (Signed)
I refused medication then pharmacy re-sent with note written with it. Called pharmacy states patient picked up first rx on 7/7, second one filled 8/2 picked up 8/6, third one filled 9/3picked up 9/6, and fourth one 10/8. States patient should be due since it was written on 7/7 with 3 refills

## 2019-03-19 ENCOUNTER — Other Ambulatory Visit: Payer: Self-pay | Admitting: Internal Medicine

## 2019-03-25 DIAGNOSIS — M545 Low back pain: Secondary | ICD-10-CM | POA: Diagnosis not present

## 2019-03-25 DIAGNOSIS — M9669 Fracture of other bone following insertion of orthopedic implant, joint prosthesis, or bone plate: Secondary | ICD-10-CM | POA: Diagnosis not present

## 2019-03-25 DIAGNOSIS — T84226A Displacement of internal fixation device of vertebrae, initial encounter: Secondary | ICD-10-CM | POA: Diagnosis not present

## 2019-03-25 DIAGNOSIS — T84216A Breakdown (mechanical) of internal fixation device of vertebrae, initial encounter: Secondary | ICD-10-CM | POA: Diagnosis not present

## 2019-03-25 DIAGNOSIS — Z981 Arthrodesis status: Secondary | ICD-10-CM | POA: Diagnosis not present

## 2019-03-25 DIAGNOSIS — M4317 Spondylolisthesis, lumbosacral region: Secondary | ICD-10-CM | POA: Diagnosis not present

## 2019-03-25 DIAGNOSIS — Z9889 Other specified postprocedural states: Secondary | ICD-10-CM | POA: Diagnosis not present

## 2019-03-25 DIAGNOSIS — M4854XA Collapsed vertebra, not elsewhere classified, thoracic region, initial encounter for fracture: Secondary | ICD-10-CM | POA: Diagnosis not present

## 2019-03-25 DIAGNOSIS — M4854XD Collapsed vertebra, not elsewhere classified, thoracic region, subsequent encounter for fracture with routine healing: Secondary | ICD-10-CM | POA: Diagnosis not present

## 2019-03-25 DIAGNOSIS — Z8781 Personal history of (healed) traumatic fracture: Secondary | ICD-10-CM | POA: Diagnosis not present

## 2019-03-27 DIAGNOSIS — Z20822 Contact with and (suspected) exposure to covid-19: Secondary | ICD-10-CM | POA: Diagnosis not present

## 2019-03-27 DIAGNOSIS — Z20828 Contact with and (suspected) exposure to other viral communicable diseases: Secondary | ICD-10-CM | POA: Diagnosis not present

## 2019-03-31 DIAGNOSIS — Z981 Arthrodesis status: Secondary | ICD-10-CM | POA: Diagnosis not present

## 2019-03-31 DIAGNOSIS — T84498A Other mechanical complication of other internal orthopedic devices, implants and grafts, initial encounter: Secondary | ICD-10-CM | POA: Diagnosis not present

## 2019-03-31 DIAGNOSIS — T84226A Displacement of internal fixation device of vertebrae, initial encounter: Secondary | ICD-10-CM | POA: Diagnosis not present

## 2019-03-31 DIAGNOSIS — S01511A Laceration without foreign body of lip, initial encounter: Secondary | ICD-10-CM | POA: Diagnosis not present

## 2019-03-31 DIAGNOSIS — S065X1A Traumatic subdural hemorrhage with loss of consciousness of 30 minutes or less, initial encounter: Secondary | ICD-10-CM | POA: Diagnosis not present

## 2019-03-31 DIAGNOSIS — E871 Hypo-osmolality and hyponatremia: Secondary | ICD-10-CM | POA: Diagnosis not present

## 2019-03-31 DIAGNOSIS — S01112A Laceration without foreign body of left eyelid and periocular area, initial encounter: Secondary | ICD-10-CM | POA: Diagnosis not present

## 2019-03-31 DIAGNOSIS — E7800 Pure hypercholesterolemia, unspecified: Secondary | ICD-10-CM | POA: Diagnosis not present

## 2019-03-31 DIAGNOSIS — S065X9A Traumatic subdural hemorrhage with loss of consciousness of unspecified duration, initial encounter: Secondary | ICD-10-CM | POA: Diagnosis not present

## 2019-03-31 DIAGNOSIS — E785 Hyperlipidemia, unspecified: Secondary | ICD-10-CM | POA: Diagnosis not present

## 2019-03-31 DIAGNOSIS — M4324 Fusion of spine, thoracic region: Secondary | ICD-10-CM | POA: Diagnosis not present

## 2019-03-31 DIAGNOSIS — W1839XA Other fall on same level, initial encounter: Secondary | ICD-10-CM | POA: Diagnosis not present

## 2019-03-31 DIAGNOSIS — R079 Chest pain, unspecified: Secondary | ICD-10-CM | POA: Diagnosis not present

## 2019-03-31 DIAGNOSIS — D649 Anemia, unspecified: Secondary | ICD-10-CM | POA: Diagnosis not present

## 2019-03-31 DIAGNOSIS — G894 Chronic pain syndrome: Secondary | ICD-10-CM | POA: Diagnosis not present

## 2019-04-03 DIAGNOSIS — T84498A Other mechanical complication of other internal orthopedic devices, implants and grafts, initial encounter: Secondary | ICD-10-CM | POA: Diagnosis not present

## 2019-04-03 DIAGNOSIS — I493 Ventricular premature depolarization: Secondary | ICD-10-CM | POA: Diagnosis not present

## 2019-04-03 DIAGNOSIS — K219 Gastro-esophageal reflux disease without esophagitis: Secondary | ICD-10-CM | POA: Diagnosis present

## 2019-04-03 DIAGNOSIS — I2510 Atherosclerotic heart disease of native coronary artery without angina pectoris: Secondary | ICD-10-CM | POA: Diagnosis present

## 2019-04-03 DIAGNOSIS — E7800 Pure hypercholesterolemia, unspecified: Secondary | ICD-10-CM | POA: Diagnosis present

## 2019-04-03 DIAGNOSIS — T84226A Displacement of internal fixation device of vertebrae, initial encounter: Secondary | ICD-10-CM | POA: Diagnosis present

## 2019-04-03 DIAGNOSIS — Z888 Allergy status to other drugs, medicaments and biological substances status: Secondary | ICD-10-CM | POA: Diagnosis not present

## 2019-04-03 DIAGNOSIS — E785 Hyperlipidemia, unspecified: Secondary | ICD-10-CM | POA: Diagnosis present

## 2019-04-03 DIAGNOSIS — S065X1A Traumatic subdural hemorrhage with loss of consciousness of 30 minutes or less, initial encounter: Secondary | ICD-10-CM | POA: Diagnosis present

## 2019-04-03 DIAGNOSIS — E871 Hypo-osmolality and hyponatremia: Secondary | ICD-10-CM | POA: Diagnosis not present

## 2019-04-03 DIAGNOSIS — R9431 Abnormal electrocardiogram [ECG] [EKG]: Secondary | ICD-10-CM | POA: Diagnosis not present

## 2019-04-03 DIAGNOSIS — E876 Hypokalemia: Secondary | ICD-10-CM | POA: Diagnosis present

## 2019-04-03 DIAGNOSIS — I348 Other nonrheumatic mitral valve disorders: Secondary | ICD-10-CM | POA: Diagnosis not present

## 2019-04-03 DIAGNOSIS — S022XXA Fracture of nasal bones, initial encounter for closed fracture: Secondary | ICD-10-CM | POA: Diagnosis present

## 2019-04-03 DIAGNOSIS — M810 Age-related osteoporosis without current pathological fracture: Secondary | ICD-10-CM | POA: Diagnosis present

## 2019-04-03 DIAGNOSIS — I10 Essential (primary) hypertension: Secondary | ICD-10-CM | POA: Diagnosis present

## 2019-04-03 DIAGNOSIS — G4700 Insomnia, unspecified: Secondary | ICD-10-CM | POA: Diagnosis present

## 2019-04-03 DIAGNOSIS — Z7982 Long term (current) use of aspirin: Secondary | ICD-10-CM | POA: Diagnosis not present

## 2019-04-03 DIAGNOSIS — R4020 Unspecified coma: Secondary | ICD-10-CM | POA: Diagnosis not present

## 2019-04-03 DIAGNOSIS — I491 Atrial premature depolarization: Secondary | ICD-10-CM | POA: Diagnosis not present

## 2019-04-03 DIAGNOSIS — I700 Atherosclerosis of aorta: Secondary | ICD-10-CM | POA: Diagnosis not present

## 2019-04-03 DIAGNOSIS — D649 Anemia, unspecified: Secondary | ICD-10-CM | POA: Diagnosis present

## 2019-04-03 DIAGNOSIS — S01112A Laceration without foreign body of left eyelid and periocular area, initial encounter: Secondary | ICD-10-CM | POA: Diagnosis not present

## 2019-04-03 DIAGNOSIS — Z823 Family history of stroke: Secondary | ICD-10-CM | POA: Diagnosis not present

## 2019-04-03 DIAGNOSIS — M4324 Fusion of spine, thoracic region: Secondary | ICD-10-CM | POA: Diagnosis not present

## 2019-04-03 DIAGNOSIS — S0083XA Contusion of other part of head, initial encounter: Secondary | ICD-10-CM | POA: Diagnosis not present

## 2019-04-03 DIAGNOSIS — Z043 Encounter for examination and observation following other accident: Secondary | ICD-10-CM | POA: Diagnosis not present

## 2019-04-03 DIAGNOSIS — S065X9A Traumatic subdural hemorrhage with loss of consciousness of unspecified duration, initial encounter: Secondary | ICD-10-CM | POA: Diagnosis not present

## 2019-04-03 DIAGNOSIS — F329 Major depressive disorder, single episode, unspecified: Secondary | ICD-10-CM | POA: Diagnosis present

## 2019-04-03 DIAGNOSIS — F419 Anxiety disorder, unspecified: Secondary | ICD-10-CM | POA: Diagnosis present

## 2019-04-03 DIAGNOSIS — G894 Chronic pain syndrome: Secondary | ICD-10-CM | POA: Diagnosis not present

## 2019-04-03 DIAGNOSIS — Z7901 Long term (current) use of anticoagulants: Secondary | ICD-10-CM | POA: Diagnosis not present

## 2019-04-03 DIAGNOSIS — I951 Orthostatic hypotension: Secondary | ICD-10-CM | POA: Diagnosis present

## 2019-04-03 DIAGNOSIS — Z8249 Family history of ischemic heart disease and other diseases of the circulatory system: Secondary | ICD-10-CM | POA: Diagnosis not present

## 2019-04-03 DIAGNOSIS — R55 Syncope and collapse: Secondary | ICD-10-CM | POA: Diagnosis not present

## 2019-04-03 DIAGNOSIS — G8929 Other chronic pain: Secondary | ICD-10-CM | POA: Diagnosis not present

## 2019-04-03 DIAGNOSIS — Z981 Arthrodesis status: Secondary | ICD-10-CM | POA: Diagnosis not present

## 2019-04-03 DIAGNOSIS — M549 Dorsalgia, unspecified: Secondary | ICD-10-CM | POA: Diagnosis not present

## 2019-04-03 DIAGNOSIS — Z87891 Personal history of nicotine dependence: Secondary | ICD-10-CM | POA: Diagnosis not present

## 2019-04-03 DIAGNOSIS — S01511A Laceration without foreign body of lip, initial encounter: Secondary | ICD-10-CM | POA: Diagnosis not present

## 2019-04-03 DIAGNOSIS — W1839XA Other fall on same level, initial encounter: Secondary | ICD-10-CM | POA: Diagnosis not present

## 2019-04-03 DIAGNOSIS — S0181XA Laceration without foreign body of other part of head, initial encounter: Secondary | ICD-10-CM | POA: Diagnosis present

## 2019-04-17 ENCOUNTER — Encounter: Payer: Self-pay | Admitting: Internal Medicine

## 2019-04-17 ENCOUNTER — Ambulatory Visit (INDEPENDENT_AMBULATORY_CARE_PROVIDER_SITE_OTHER): Payer: Medicare Other | Admitting: Internal Medicine

## 2019-04-17 ENCOUNTER — Other Ambulatory Visit: Payer: Self-pay

## 2019-04-17 VITALS — BP 118/76 | HR 84 | Temp 98.2°F | Ht 66.25 in | Wt 122.0 lb

## 2019-04-17 DIAGNOSIS — R55 Syncope and collapse: Secondary | ICD-10-CM

## 2019-04-17 DIAGNOSIS — S065XAA Traumatic subdural hemorrhage with loss of consciousness status unknown, initial encounter: Secondary | ICD-10-CM | POA: Insufficient documentation

## 2019-04-17 DIAGNOSIS — S065X9A Traumatic subdural hemorrhage with loss of consciousness of unspecified duration, initial encounter: Secondary | ICD-10-CM | POA: Diagnosis not present

## 2019-04-17 DIAGNOSIS — G4700 Insomnia, unspecified: Secondary | ICD-10-CM | POA: Diagnosis not present

## 2019-04-17 MED ORDER — ATORVASTATIN CALCIUM 40 MG PO TABS: 40.0000 mg | ORAL_TABLET | Freq: Every day | ORAL | 3 refills | Status: DC

## 2019-04-17 MED ORDER — ZOLPIDEM TARTRATE 5 MG PO TABS: 5.0000 mg | ORAL_TABLET | Freq: Every evening | ORAL | 3 refills | Status: DC | PRN

## 2019-04-17 NOTE — Progress Notes (Signed)
   Subjective:   Patient ID: Hailey [name], female    DOB: [birth year], 79 y.o.   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 She was taken off many medications she uses prn during hospital stay as they were concerned this caused the syncope. Truly she was NPO for a long time prior to presenting to surgical center and syncope with walking back. Cardiac workup inpatient unrevealing and wearing ziopatch to check for problems. Denies recurrence since being home and eating normally. Would appreciate refill of ambien as she uses this prn and is sleeping terribly since back surgery they did break 2 bones low back and she is due for another surgery to help stabilize and place titanium rods. Denies vision changes. Some fatigue and mild memory concerns since the hematoma but overall improving.  PMH, [facility], social history reviewed and updated  Review of Systems  Constitutional: Positive for fatigue.  HENT: Negative.   Eyes: Negative.   Respiratory: Negative for cough, chest tightness and shortness of breath.   Cardiovascular: Negative for chest pain, palpitations and leg swelling.  Gastrointestinal: Negative for abdominal distention, abdominal pain, constipation, diarrhea, nausea and vomiting.  Musculoskeletal: Positive for back pain.  Skin: Negative.   Neurological: Negative.   Psychiatric/Behavioral: Negative.     Objective:  Physical Exam Constitutional:      Appearance: She is well-developed.  HENT:     Head: Normocephalic and atraumatic.  Cardiovascular:     Rate and Rhythm: Normal rate and regular rhythm.  Pulmonary:     Effort: Pulmonary effort is normal. No respiratory distress.     Breath sounds: Normal breath sounds. No wheezing or rales.  Abdominal:     General: Bowel sounds are normal. There is no distension.     Palpations: Abdomen is soft.     Tenderness:  There is no abdominal tenderness. There is no rebound.  Musculoskeletal:        General: Tenderness present.     Cervical back: Normal range of motion.     Comments: Pain in back  Skin:    General: Skin is warm and dry.  Neurological:     Mental Status: She is alert and oriented to person, place, and time.     Coordination: Coordination normal.     Vitals:   04/17/19 1359  BP: 118/76  Pulse: 84  Temp: 98.2 F (36.8 C)  TempSrc: Oral  SpO2: 97%  Weight: 122 lb (55.3 kg)  Height: 5' 6.25" (1.683 m)    This visit occurred during the SARS-CoV-2 public health emergency.  Safety protocols were in place, including screening questions prior to the visit, additional usage of staff PPE, and extensive cleaning of exam room while observing appropriate contact time as indicated for disinfecting solutions.   Assessment & Plan:  Visit time 20 minutes in face to face communication with patient and coordination of care, additional 10 minutes spent in record review, coordination or care, ordering tests, communicating/referring to other healthcare professionals, documenting in medical records all on the same day of the visit for total time 30 minutes spent on the visit.

## 2019-04-17 NOTE — Assessment & Plan Note (Signed)
Resolving and will follow up with neurosurgery for imaging Monday. Overall mild concussion symptoms. Off antiepileptic at this time.

## 2019-04-17 NOTE — Assessment & Plan Note (Signed)
Suspect related to being NPO for surgical procedure and prior neurosurgical interventions.

## 2019-04-17 NOTE — Patient Instructions (Signed)
We have refilled the cholesterol and the ambien.

## 2019-04-17 NOTE — Assessment & Plan Note (Signed)
Refill Hailey [name] which she takes rarely. She is having a lot of problems sleeping now with back injury and needs back surgery in the near future.

## 2019-04-20 DIAGNOSIS — S065X9A Traumatic subdural hemorrhage with loss of consciousness of unspecified duration, initial encounter: Secondary | ICD-10-CM | POA: Diagnosis not present

## 2019-04-20 DIAGNOSIS — T84498A Other mechanical complication of other internal orthopedic devices, implants and grafts, initial encounter: Secondary | ICD-10-CM | POA: Diagnosis not present

## 2019-04-20 DIAGNOSIS — S065X9D Traumatic subdural hemorrhage with loss of consciousness of unspecified duration, subsequent encounter: Secondary | ICD-10-CM | POA: Diagnosis not present

## 2019-04-24 ENCOUNTER — Telehealth: Payer: Self-pay

## 2019-04-24 MED ORDER — NITROFURANTOIN MONOHYD MACRO 100 MG PO CAPS: 100.0000 mg | ORAL_CAPSULE | Freq: Two times a day (BID) | ORAL | 0 refills | Status: DC

## 2019-04-24 NOTE — Telephone Encounter (Signed)
Pt notified of macrobid being sent to pharmacy, she verbalized understanding.

## 2019-04-24 NOTE — Telephone Encounter (Signed)
New message    The patient calling C/o UTI offers an appt to see Dr. Sharlet Salina patient refuses at this time been up all night.   last seen  3.5.2021  The patient is asking can Dr. Sharlet Salina called in something for her to [facility] will deliver medication to her home.

## 2019-04-24 NOTE — Telephone Encounter (Signed)
Sent in macrobid to take 1 pill twice a day for 1 week. If no improvement or worsening needs visit.

## 2019-04-29 ENCOUNTER — Other Ambulatory Visit: Payer: Self-pay | Admitting: Internal Medicine

## 2019-05-01 DIAGNOSIS — R55 Syncope and collapse: Secondary | ICD-10-CM | POA: Diagnosis not present

## 2019-05-03 DIAGNOSIS — I471 Supraventricular tachycardia: Secondary | ICD-10-CM | POA: Diagnosis not present

## 2019-05-06 DIAGNOSIS — T84498A Other mechanical complication of other internal orthopedic devices, implants and grafts, initial encounter: Secondary | ICD-10-CM | POA: Diagnosis not present

## 2019-05-06 DIAGNOSIS — S065X9A Traumatic subdural hemorrhage with loss of consciousness of unspecified duration, initial encounter: Secondary | ICD-10-CM | POA: Diagnosis not present

## 2019-05-06 DIAGNOSIS — Z981 Arthrodesis status: Secondary | ICD-10-CM | POA: Diagnosis not present

## 2019-05-15 DIAGNOSIS — Z01812 Encounter for preprocedural laboratory examination: Secondary | ICD-10-CM | POA: Diagnosis not present

## 2019-05-15 DIAGNOSIS — M5137 Other intervertebral disc degeneration, lumbosacral region: Secondary | ICD-10-CM | POA: Diagnosis not present

## 2019-05-15 DIAGNOSIS — S065X9A Traumatic subdural hemorrhage with loss of consciousness of unspecified duration, initial encounter: Secondary | ICD-10-CM | POA: Diagnosis not present

## 2019-05-15 DIAGNOSIS — S065X9D Traumatic subdural hemorrhage with loss of consciousness of unspecified duration, subsequent encounter: Secondary | ICD-10-CM | POA: Diagnosis not present

## 2019-05-15 DIAGNOSIS — Z20822 Contact with and (suspected) exposure to covid-19: Secondary | ICD-10-CM | POA: Diagnosis not present

## 2019-05-21 DIAGNOSIS — Z87891 Personal history of nicotine dependence: Secondary | ICD-10-CM | POA: Diagnosis not present

## 2019-05-21 DIAGNOSIS — I10 Essential (primary) hypertension: Secondary | ICD-10-CM | POA: Diagnosis present

## 2019-05-21 DIAGNOSIS — F329 Major depressive disorder, single episode, unspecified: Secondary | ICD-10-CM | POA: Diagnosis present

## 2019-05-21 DIAGNOSIS — Z472 Encounter for removal of internal fixation device: Secondary | ICD-10-CM | POA: Diagnosis not present

## 2019-05-21 DIAGNOSIS — J42 Unspecified chronic bronchitis: Secondary | ICD-10-CM | POA: Diagnosis present

## 2019-05-21 DIAGNOSIS — M810 Age-related osteoporosis without current pathological fracture: Secondary | ICD-10-CM | POA: Diagnosis present

## 2019-05-21 DIAGNOSIS — M545 Low back pain: Secondary | ICD-10-CM | POA: Diagnosis not present

## 2019-05-21 DIAGNOSIS — E785 Hyperlipidemia, unspecified: Secondary | ICD-10-CM | POA: Diagnosis present

## 2019-05-21 DIAGNOSIS — J3089 Other allergic rhinitis: Secondary | ICD-10-CM | POA: Diagnosis present

## 2019-05-21 DIAGNOSIS — K219 Gastro-esophageal reflux disease without esophagitis: Secondary | ICD-10-CM | POA: Diagnosis present

## 2019-05-21 DIAGNOSIS — F419 Anxiety disorder, unspecified: Secondary | ICD-10-CM | POA: Diagnosis present

## 2019-05-21 DIAGNOSIS — Z79899 Other long term (current) drug therapy: Secondary | ICD-10-CM | POA: Diagnosis not present

## 2019-05-21 DIAGNOSIS — G894 Chronic pain syndrome: Secondary | ICD-10-CM | POA: Diagnosis present

## 2019-05-21 DIAGNOSIS — F5104 Psychophysiologic insomnia: Secondary | ICD-10-CM | POA: Diagnosis present

## 2019-05-21 DIAGNOSIS — T84216A Breakdown (mechanical) of internal fixation device of vertebrae, initial encounter: Secondary | ICD-10-CM | POA: Diagnosis not present

## 2019-05-21 DIAGNOSIS — J439 Emphysema, unspecified: Secondary | ICD-10-CM | POA: Diagnosis present

## 2019-05-21 DIAGNOSIS — E7800 Pure hypercholesterolemia, unspecified: Secondary | ICD-10-CM | POA: Diagnosis present

## 2019-05-21 DIAGNOSIS — Z981 Arthrodesis status: Secondary | ICD-10-CM | POA: Diagnosis not present

## 2019-05-21 DIAGNOSIS — M960 Pseudarthrosis after fusion or arthrodesis: Secondary | ICD-10-CM | POA: Diagnosis present

## 2019-06-01 DIAGNOSIS — R55 Syncope and collapse: Secondary | ICD-10-CM | POA: Diagnosis not present

## 2019-06-01 DIAGNOSIS — I471 Supraventricular tachycardia: Secondary | ICD-10-CM | POA: Diagnosis not present

## 2019-06-02 DIAGNOSIS — Z4802 Encounter for removal of sutures: Secondary | ICD-10-CM | POA: Diagnosis not present

## 2019-06-10 DIAGNOSIS — M810 Age-related osteoporosis without current pathological fracture: Secondary | ICD-10-CM | POA: Diagnosis not present

## 2019-08-02 ENCOUNTER — Other Ambulatory Visit: Payer: Self-pay | Admitting: Internal Medicine

## 2019-08-03 NOTE — Telephone Encounter (Signed)
01/19/2019 Lorazepam 1 Mg Tablet 60#  Last ov 04/17/19 Next ov n/s

## 2019-08-11 ENCOUNTER — Other Ambulatory Visit: Payer: Self-pay | Admitting: Internal Medicine

## 2019-08-19 DIAGNOSIS — M170 Bilateral primary osteoarthritis of knee: Secondary | ICD-10-CM | POA: Diagnosis not present

## 2019-08-19 DIAGNOSIS — M1811 Unilateral primary osteoarthritis of first carpometacarpal joint, right hand: Secondary | ICD-10-CM | POA: Diagnosis not present

## 2019-08-19 DIAGNOSIS — M19011 Primary osteoarthritis, right shoulder: Secondary | ICD-10-CM | POA: Diagnosis not present

## 2019-08-24 DIAGNOSIS — Z981 Arthrodesis status: Secondary | ICD-10-CM | POA: Diagnosis not present

## 2019-08-24 DIAGNOSIS — Z09 Encounter for follow-up examination after completed treatment for conditions other than malignant neoplasm: Secondary | ICD-10-CM | POA: Diagnosis not present

## 2019-09-02 DIAGNOSIS — M1811 Unilateral primary osteoarthritis of first carpometacarpal joint, right hand: Secondary | ICD-10-CM | POA: Diagnosis not present

## 2019-09-02 DIAGNOSIS — M1712 Unilateral primary osteoarthritis, left knee: Secondary | ICD-10-CM | POA: Diagnosis not present

## 2019-09-02 DIAGNOSIS — M7541 Impingement syndrome of right shoulder: Secondary | ICD-10-CM | POA: Diagnosis not present

## 2019-09-09 DIAGNOSIS — Z981 Arthrodesis status: Secondary | ICD-10-CM | POA: Diagnosis not present

## 2019-09-09 DIAGNOSIS — R2689 Other abnormalities of gait and mobility: Secondary | ICD-10-CM | POA: Diagnosis not present

## 2019-09-21 DIAGNOSIS — Z981 Arthrodesis status: Secondary | ICD-10-CM | POA: Diagnosis not present

## 2019-09-21 DIAGNOSIS — R2689 Other abnormalities of gait and mobility: Secondary | ICD-10-CM | POA: Diagnosis not present

## 2019-09-28 DIAGNOSIS — R55 Syncope and collapse: Secondary | ICD-10-CM | POA: Diagnosis not present

## 2019-09-28 DIAGNOSIS — R112 Nausea with vomiting, unspecified: Secondary | ICD-10-CM | POA: Diagnosis not present

## 2019-09-28 DIAGNOSIS — R531 Weakness: Secondary | ICD-10-CM | POA: Diagnosis not present

## 2019-09-28 DIAGNOSIS — R197 Diarrhea, unspecified: Secondary | ICD-10-CM | POA: Diagnosis not present

## 2019-10-03 ENCOUNTER — Other Ambulatory Visit: Payer: Self-pay | Admitting: Internal Medicine

## 2019-10-07 ENCOUNTER — Telehealth: Payer: Self-pay | Admitting: Internal Medicine

## 2019-10-07 NOTE — Telephone Encounter (Signed)
Error

## 2019-10-07 NOTE — Telephone Encounter (Signed)
1.Medication Requested:traMADol-acetaminophen (ULTRACET) 37.5-325 MG tablet  2. Pharmacy (Name, [place], City):[facility], [place]  3. On Med List: Yes   4. Last Visit with PCP: 3.5.21   5. Next visit date with PCP: n/a   Agent: Please be advised that RX refills may take up to 3 business days. We ask that you follow-up with your pharmacy.

## 2019-10-08 ENCOUNTER — Other Ambulatory Visit: Payer: Self-pay | Admitting: Internal Medicine

## 2019-10-09 NOTE — Telephone Encounter (Signed)
[facility] called and said that they have been sending over the patients traMADol-acetaminophen (ULTRACET) 37.5-325 MG tablet [[id number]]  Since 10/03/2019 and they were wanting a call back.  [phone] Ext. [phone]

## 2019-10-13 ENCOUNTER — Encounter: Payer: Self-pay | Admitting: Internal Medicine

## 2019-10-13 ENCOUNTER — Other Ambulatory Visit: Payer: Self-pay

## 2019-10-13 ENCOUNTER — Ambulatory Visit (INDEPENDENT_AMBULATORY_CARE_PROVIDER_SITE_OTHER): Payer: Medicare Other | Admitting: Internal Medicine

## 2019-10-13 VITALS — BP 116/70 | HR 89 | Temp 98.2°F | Resp 16 | Ht 66.25 in | Wt 124.0 lb

## 2019-10-13 DIAGNOSIS — M153 Secondary multiple arthritis: Secondary | ICD-10-CM | POA: Diagnosis not present

## 2019-10-13 DIAGNOSIS — G4701 Insomnia due to medical condition: Secondary | ICD-10-CM

## 2019-10-13 DIAGNOSIS — Z23 Encounter for immunization: Secondary | ICD-10-CM | POA: Diagnosis not present

## 2019-10-13 DIAGNOSIS — G8929 Other chronic pain: Secondary | ICD-10-CM | POA: Insufficient documentation

## 2019-10-13 MED ORDER — ZOLPIDEM TARTRATE 5 MG PO TABS: 5.0000 mg | ORAL_TABLET | Freq: Every evening | ORAL | 0 refills | Status: DC | PRN

## 2019-10-13 MED ORDER — TRAMADOL-ACETAMINOPHEN 37.5-325 MG PO TABS: ORAL_TABLET | ORAL | 0 refills | Status: DC

## 2019-10-13 NOTE — Patient Instructions (Signed)

## 2019-10-13 NOTE — Progress Notes (Signed)
Subjective:  Patient ID: Hailey [name], female    DOB: January 30, 1941  Age: 79 y.o. MRN: [id number]  CC: Osteoarthritis  This visit occurred during the SARS-CoV-2 public health emergency.  Safety protocols were in place, including screening questions prior to the visit, additional usage of staff PPE, and extensive cleaning of exam room while observing appropriate contact time as indicated for disinfecting solutions.    HPI [name] presents for f/up - She complains of chronic pain from osteoarthritis and requests a refill on tramadol with acetaminophen.  She tells me the pain keeps her awake at night so she also wants a refill of Ambien.  Outpatient Medications Prior to Visit  Medication Sig Dispense Refill  . atorvastatin (LIPITOR) 40 MG tablet Take 1 tablet (40 mg total) by mouth daily. 90 tablet 3  . B Complex Vitamins (B COMPLEX PO) Take 1 tablet by mouth daily.    . Bacillus Coagulans-Inulin (PROBIOTIC-PREBIOTIC PO) Take 1 capsule by mouth daily.    . Calcium Carbonate-Vitamin D (CALCIUM + D PO) Take 1,200 mg by mouth daily.     . Coenzyme Q10 (CO Q 10) 100 MG CAPS Take 200 mg by mouth daily.    Marland Kitchen denosumab (PROLIA) 60 MG/ML SOLN injection Inject 60 mg into the skin every 6 (six) months. Administer in upper arm, thigh, or abdomen    . DHA-EPA-Flaxseed Oil-Vitamin E (THERA TEARS NUTRITION PO) Place 1 drop into both eyes daily as needed (dry eyes).    Marland Kitchen diclofenac sodium (VOLTAREN) 1 % GEL Apply 2 g topically daily as needed (pain).    Marland Kitchen diphenhydrAMINE HCl, Sleep, (ZZZQUIL PO) Take 1 tablet by mouth daily.    Marland Kitchen escitalopram (LEXAPRO) 20 MG tablet TAKE 1 TABLET ONCE DAILY. 90 tablet 0  . ferrous sulfate 325 (65 FE) MG tablet Take 325 mg by mouth daily with breakfast.      . fexofenadine (ALLEGRA) 180 MG tablet Take 180 mg by mouth daily.      . fluticasone (FLONASE) 50 MCG/ACT nasal spray Use 1 spray each nostril 1-2 times daily as needed (Patient taking differently: Place 2 sprays  into both nostrils 2 (two) times daily. Use 1 spray each nostril 1-2 times daily as needed) 16 g 5  . hydrocortisone (ANUSOL-HC) 2.5 % rectal cream Place 1 application rectally 2 (two) times daily. 30 g 0  . lidocaine (LIDODERM) 5 % Place 1 patch onto the skin daily. Remove & Discard patch within 12 hours or as directed by MD    . LORazepam (ATIVAN) 1 MG tablet TAKE 1 OR 2 TABLETS AT BEDTIME. 60 tablet 5  . Misc Natural Products (OSTEO BI-FLEX TRIPLE STRENGTH PO) Take 1 tablet by mouth 2 (two) times daily.     . Multiple Vitamin (MULTIVITAMIN PO) Take 1 tablet by mouth daily.     . nitrofurantoin, macrocrystal-monohydrate, (MACROBID) 100 MG capsule Take 1 capsule (100 mg total) by mouth 2 (two) times daily. 14 capsule 0  . NON FORMULARY Take 5,000 mg by mouth daily.    . Omega 3-6-9 Fatty Acids (OMEGA 3-6-9 COMPLEX PO) Take 1 capsule by mouth daily.    Marland Kitchen omeprazole (PRILOSEC) 20 MG capsule Take 1 capsule (20 mg total) by mouth daily. 90 capsule 3  . OVER THE COUNTER MEDICATION Take 1 tablet by mouth daily. Maximum Green Complete    . sennosides-docusate sodium (SENOKOT-S) 8.6-50 MG tablet Take 2 tablets by mouth daily. 30 tablet 1  . Turmeric 500 MG TABS  Take 1,500 mg by mouth daily.    . vitamin E 400 UNIT capsule Take 400 Units by mouth daily.    . traMADol-acetaminophen (ULTRACET) 37.5-325 MG tablet TAKE (1) TABLET TWICE A DAY AS NEEDED. 60 tablet 2  . zolpidem (AMBIEN) 5 MG tablet TAKE 1 TABLET AT BEDTIME AS NEEDED. 30 tablet 0   No facility-administered medications prior to visit.    ROS Review of Systems  Constitutional: Negative.  Negative for diaphoresis and fatigue.  HENT: Negative.   Eyes: Negative.   Respiratory: Negative for cough, shortness of breath and wheezing.   Cardiovascular: Negative for chest pain, palpitations and leg swelling.  Gastrointestinal: Negative for abdominal pain, constipation, diarrhea and vomiting.  Endocrine: Negative.   Genitourinary: Negative.   Negative for difficulty urinating.  Musculoskeletal: Positive for arthralgias and gait problem. Negative for myalgias.  Skin: Negative.   Neurological: Negative for dizziness, weakness, light-headedness and numbness.  Hematological: Negative for adenopathy. Does not bruise/bleed easily.  Psychiatric/Behavioral: Positive for sleep disturbance. Negative for confusion and decreased concentration. The patient is not nervous/anxious.     Objective:  BP 116/70   Pulse 89   Temp 98.2 F (36.8 C) (Oral)   Resp 16   Ht 5' 6.25" (1.683 m)   Wt 124 lb (56.2 kg)   SpO2 97%   BMI 19.86 kg/m   BP Readings from Last 3 Encounters:  10/13/19 116/70  04/17/19 118/76  03/28/18 90/60    Wt Readings from Last 3 Encounters:  10/13/19 124 lb (56.2 kg)  04/17/19 122 lb (55.3 kg)  03/28/18 122 lb (55.3 kg)    Physical Exam Vitals reviewed.  HENT:     Nose: Nose normal.     Mouth/Throat:     Mouth: Mucous membranes are moist.  Eyes:     General: No scleral icterus.    Conjunctiva/sclera: Conjunctivae normal.  Cardiovascular:     Rate and Rhythm: Normal rate and regular rhythm.     Heart sounds: No murmur heard.   Pulmonary:     Effort: Pulmonary effort is normal.     Breath sounds: No stridor. No wheezing, rhonchi or rales.  Abdominal:     General: Abdomen is flat.     Palpations: There is no mass.     Tenderness: There is no abdominal tenderness. There is no guarding.  Musculoskeletal:        General: Deformity (djd) present.  Lymphadenopathy:     Cervical: No cervical adenopathy.  Skin:    General: Skin is warm and dry.  Neurological:     General: No focal deficit present.     Mental Status: She is alert.  Psychiatric:        Mood and Affect: Mood normal.        Behavior: Behavior normal.     Lab Results  Component Value Date   WBC 5.2 03/28/2018   HGB 12.9 03/28/2018   HCT 39.1 03/28/2018   PLT 222.0 03/28/2018   GLUCOSE 78 03/28/2018   CHOL 173 03/28/2018   TRIG  71.0 03/28/2018   HDL 62.20 03/28/2018   LDLDIRECT 170.9 03/20/2010   LDLCALC 96 03/28/2018   ALT 17 03/28/2018   AST 23 03/28/2018   NA 139 03/28/2018   K 4.8 03/28/2018   CL 100 03/28/2018   CREATININE 0.78 03/28/2018   BUN 24 (H) 03/28/2018   CO2 31 03/28/2018   TSH 1.08 03/20/2010   INR 0.99 11/18/2013   HGBA1C 6.0 03/01/2014   MICROALBUR  0.7 04/02/2006    No results found.  Assessment & Plan:   Tenzin was seen today for osteoarthritis.  Diagnoses and all orders for this visit:  Post-traumatic osteoarthritis of multiple joints -     traMADol-acetaminophen (ULTRACET) 37.5-325 MG tablet; TAKE (1) TABLET TWICE A DAY AS NEEDED.  Insomnia secondary to chronic pain -     zolpidem (AMBIEN) 5 MG tablet; Take 1 tablet (5 mg total) by mouth at bedtime as needed.  Other orders -     Flu Vaccine QUAD 6+ mos PF IM (Fluarix Quad PF)   I have changed Cheyla P. Majette's zolpidem. I am also having her maintain her fexofenadine, ferrous sulfate, Multiple Vitamin (MULTIVITAMIN PO), Misc Natural Products (OSTEO BI-FLEX TRIPLE STRENGTH PO), Calcium Carbonate-Vitamin D (CALCIUM + D PO), denosumab, fluticasone, Bacillus Coagulans-Inulin (PROBIOTIC-PREBIOTIC PO), (diphenhydrAMINE HCl, Sleep, (ZZZQUIL PO)), vitamin E, Co Q 10, Turmeric, diclofenac sodium, lidocaine, B Complex Vitamins (B COMPLEX PO), DHA-EPA-Flaxseed Oil-Vitamin E (THERA TEARS NUTRITION PO), Omega 3-6-9 Fatty Acids (OMEGA 3-6-9 COMPLEX PO), OVER THE COUNTER MEDICATION, sennosides-docusate sodium, NON FORMULARY, omeprazole, hydrocortisone, atorvastatin, nitrofurantoin (macrocrystal-monohydrate), LORazepam, escitalopram, and traMADol-acetaminophen.  Meds ordered this encounter  Medications  . traMADol-acetaminophen (ULTRACET) 37.5-325 MG tablet    Sig: TAKE (1) TABLET TWICE A DAY AS NEEDED.    Dispense:  180 tablet    Refill:  0  . zolpidem (AMBIEN) 5 MG tablet    Sig: Take 1 tablet (5 mg total) by mouth at bedtime as needed.     Dispense:  90 tablet    Refill:  0     Follow-up: Return in about 6 months (around 04/11/2020).  Scarlette Calico, MD

## 2019-10-22 DIAGNOSIS — Z23 Encounter for immunization: Secondary | ICD-10-CM | POA: Diagnosis not present

## 2019-11-25 ENCOUNTER — Other Ambulatory Visit: Payer: Self-pay | Admitting: Internal Medicine

## 2019-12-26 LAB — HM DEXA SCAN: HM Dexa Scan: NORMAL

## 2019-12-28 DIAGNOSIS — R5383 Other fatigue: Secondary | ICD-10-CM | POA: Diagnosis not present

## 2019-12-28 DIAGNOSIS — M810 Age-related osteoporosis without current pathological fracture: Secondary | ICD-10-CM | POA: Diagnosis not present

## 2019-12-28 DIAGNOSIS — E559 Vitamin D deficiency, unspecified: Secondary | ICD-10-CM | POA: Diagnosis not present

## 2019-12-30 DIAGNOSIS — S83242A Other tear of medial meniscus, current injury, left knee, initial encounter: Secondary | ICD-10-CM | POA: Diagnosis not present

## 2019-12-30 DIAGNOSIS — M810 Age-related osteoporosis without current pathological fracture: Secondary | ICD-10-CM | POA: Diagnosis not present

## 2020-01-05 DIAGNOSIS — M25562 Pain in left knee: Secondary | ICD-10-CM | POA: Diagnosis not present

## 2020-01-12 DIAGNOSIS — M7062 Trochanteric bursitis, left hip: Secondary | ICD-10-CM | POA: Diagnosis not present

## 2020-01-12 DIAGNOSIS — M1712 Unilateral primary osteoarthritis, left knee: Secondary | ICD-10-CM | POA: Diagnosis not present

## 2020-01-16 DIAGNOSIS — Z1231 Encounter for screening mammogram for malignant neoplasm of breast: Secondary | ICD-10-CM | POA: Diagnosis not present

## 2020-01-16 LAB — HM MAMMOGRAPHY

## 2020-01-20 DIAGNOSIS — M1712 Unilateral primary osteoarthritis, left knee: Secondary | ICD-10-CM | POA: Diagnosis not present

## 2020-01-21 ENCOUNTER — Encounter: Payer: Self-pay | Admitting: Internal Medicine

## 2020-01-27 DIAGNOSIS — M1712 Unilateral primary osteoarthritis, left knee: Secondary | ICD-10-CM | POA: Diagnosis not present

## 2020-01-29 ENCOUNTER — Other Ambulatory Visit: Payer: Self-pay | Admitting: Internal Medicine

## 2020-01-29 DIAGNOSIS — G4701 Insomnia due to medical condition: Secondary | ICD-10-CM

## 2020-01-29 DIAGNOSIS — G8929 Other chronic pain: Secondary | ICD-10-CM

## 2020-02-02 ENCOUNTER — Other Ambulatory Visit: Payer: Self-pay | Admitting: Internal Medicine

## 2020-02-02 DIAGNOSIS — G8929 Other chronic pain: Secondary | ICD-10-CM

## 2020-02-03 ENCOUNTER — Other Ambulatory Visit: Payer: Self-pay | Admitting: Internal Medicine

## 2020-02-03 DIAGNOSIS — G4701 Insomnia due to medical condition: Secondary | ICD-10-CM

## 2020-02-03 DIAGNOSIS — M1712 Unilateral primary osteoarthritis, left knee: Secondary | ICD-10-CM | POA: Diagnosis not present

## 2020-02-03 DIAGNOSIS — G8929 Other chronic pain: Secondary | ICD-10-CM

## 2020-02-03 NOTE — Telephone Encounter (Signed)
Refill request for zolpidem 5 mg tabs.  LOV- 10/13/19 Next OV- not scheduled Last refilled- 10/13/19

## 2020-02-10 DIAGNOSIS — M1712 Unilateral primary osteoarthritis, left knee: Secondary | ICD-10-CM | POA: Diagnosis not present

## 2020-03-19 ENCOUNTER — Other Ambulatory Visit: Payer: Self-pay | Admitting: Internal Medicine

## 2020-03-19 DIAGNOSIS — M153 Secondary multiple arthritis: Secondary | ICD-10-CM

## 2020-03-21 ENCOUNTER — Other Ambulatory Visit: Payer: Self-pay | Admitting: Internal Medicine

## 2020-03-21 DIAGNOSIS — M153 Secondary multiple arthritis: Secondary | ICD-10-CM

## 2020-03-22 NOTE — Telephone Encounter (Signed)
LR: 08-03-2019 Qty: 60 with 5 refills  Last office visit: 10-13-2019 Upcoming appointment: No pending appt

## 2020-03-23 NOTE — Telephone Encounter (Signed)
I have sent in 30 day supply of ativan but she needs visit for further refills as last time I saw her she was not taking tramadol and now she is taking concurrently ambien, lorazepam and tramadol so visit is needed to discuss the risk associated with this.

## 2020-04-27 DIAGNOSIS — H353131 Nonexudative age-related macular degeneration, bilateral, early dry stage: Secondary | ICD-10-CM | POA: Diagnosis not present

## 2020-04-27 DIAGNOSIS — H04123 Dry eye syndrome of bilateral lacrimal glands: Secondary | ICD-10-CM | POA: Diagnosis not present

## 2020-04-27 DIAGNOSIS — H2513 Age-related nuclear cataract, bilateral: Secondary | ICD-10-CM | POA: Diagnosis not present

## 2020-04-28 DIAGNOSIS — E531 Pyridoxine deficiency: Secondary | ICD-10-CM | POA: Diagnosis not present

## 2020-04-28 DIAGNOSIS — M810 Age-related osteoporosis without current pathological fracture: Secondary | ICD-10-CM | POA: Diagnosis not present

## 2020-04-28 DIAGNOSIS — E559 Vitamin D deficiency, unspecified: Secondary | ICD-10-CM | POA: Diagnosis not present

## 2020-05-17 ENCOUNTER — Other Ambulatory Visit: Payer: Self-pay | Admitting: Internal Medicine

## 2020-05-22 ENCOUNTER — Other Ambulatory Visit: Payer: Self-pay | Admitting: Internal Medicine

## 2020-05-24 ENCOUNTER — Other Ambulatory Visit: Payer: Self-pay | Admitting: Internal Medicine

## 2020-05-25 DIAGNOSIS — S22050D Wedge compression fracture of T5-T6 vertebra, subsequent encounter for fracture with routine healing: Secondary | ICD-10-CM | POA: Diagnosis not present

## 2020-05-25 DIAGNOSIS — Z981 Arthrodesis status: Secondary | ICD-10-CM | POA: Diagnosis not present

## 2020-05-25 DIAGNOSIS — S22058D Other fracture of T5-T6 vertebra, subsequent encounter for fracture with routine healing: Secondary | ICD-10-CM | POA: Diagnosis not present

## 2020-05-25 DIAGNOSIS — Z4789 Encounter for other orthopedic aftercare: Secondary | ICD-10-CM | POA: Diagnosis not present

## 2020-05-25 DIAGNOSIS — S22080D Wedge compression fracture of T11-T12 vertebra, subsequent encounter for fracture with routine healing: Secondary | ICD-10-CM | POA: Diagnosis not present

## 2020-05-25 DIAGNOSIS — M955 Acquired deformity of pelvis: Secondary | ICD-10-CM | POA: Diagnosis not present

## 2020-05-25 DIAGNOSIS — M1712 Unilateral primary osteoarthritis, left knee: Secondary | ICD-10-CM | POA: Diagnosis not present

## 2020-05-25 DIAGNOSIS — M1612 Unilateral primary osteoarthritis, left hip: Secondary | ICD-10-CM | POA: Diagnosis not present

## 2020-05-25 DIAGNOSIS — S22088D Other fracture of T11-T12 vertebra, subsequent encounter for fracture with routine healing: Secondary | ICD-10-CM | POA: Diagnosis not present

## 2020-05-30 NOTE — Telephone Encounter (Signed)
This was done last week, was it faxed?

## 2020-06-02 DIAGNOSIS — E531 Pyridoxine deficiency: Secondary | ICD-10-CM | POA: Diagnosis not present

## 2020-06-02 DIAGNOSIS — M810 Age-related osteoporosis without current pathological fracture: Secondary | ICD-10-CM | POA: Diagnosis not present

## 2020-06-26 ENCOUNTER — Telehealth: Payer: Self-pay | Admitting: Internal Medicine

## 2020-06-27 NOTE — Telephone Encounter (Signed)
Patient calling, wondering if she can get a short supply until her appointment on 05.23.22

## 2020-06-27 NOTE — Telephone Encounter (Signed)
Spoke to patient about Atorvastatin refill request denied. I advised patient it was denied because it has been a year since office visit with Dr Sharlet Salina. Patient has appointment on 5/23 or 5/26 to see Dr Sharlet Salina.

## 2020-06-29 DIAGNOSIS — M1612 Unilateral primary osteoarthritis, left hip: Secondary | ICD-10-CM | POA: Diagnosis not present

## 2020-06-29 DIAGNOSIS — S83242A Other tear of medial meniscus, current injury, left knee, initial encounter: Secondary | ICD-10-CM | POA: Diagnosis not present

## 2020-06-29 DIAGNOSIS — M810 Age-related osteoporosis without current pathological fracture: Secondary | ICD-10-CM | POA: Diagnosis not present

## 2020-07-04 ENCOUNTER — Ambulatory Visit: Payer: Medicare Other | Admitting: Internal Medicine

## 2020-07-04 DIAGNOSIS — M2242 Chondromalacia patellae, left knee: Secondary | ICD-10-CM | POA: Diagnosis not present

## 2020-07-04 DIAGNOSIS — M1612 Unilateral primary osteoarthritis, left hip: Secondary | ICD-10-CM | POA: Diagnosis not present

## 2020-07-12 ENCOUNTER — Telehealth: Payer: Self-pay | Admitting: Internal Medicine

## 2020-07-12 ENCOUNTER — Ambulatory Visit: Payer: Medicare Other | Admitting: Internal Medicine

## 2020-07-12 NOTE — Telephone Encounter (Signed)
Patient has asked or refills on medications sent to  [facility], [place] [address]  atorvastatin (LIPITOR) 40 MG tablet  escitalopram (LEXAPRO) 20 MG tablet  LORazepam (ATIVAN) 1 MG tablet

## 2020-07-13 MED ORDER — ESCITALOPRAM OXALATE 20 MG PO TABS: 1.0000 | ORAL_TABLET | Freq: Every day | ORAL | 0 refills | Status: DC

## 2020-07-13 MED ORDER — ATORVASTATIN CALCIUM 40 MG PO TABS: 40.0000 mg | ORAL_TABLET | Freq: Every day | ORAL | 0 refills | Status: DC

## 2020-07-13 NOTE — Telephone Encounter (Signed)
Lexapro and Atorvastatin has been sent to the patient's pharmacy.

## 2020-07-13 NOTE — Telephone Encounter (Signed)
Can have refill on lexapro and atorvastatin. Cannot have refill on lorazepam due to needing visit.

## 2020-07-19 DIAGNOSIS — M1612 Unilateral primary osteoarthritis, left hip: Secondary | ICD-10-CM | POA: Diagnosis not present

## 2020-07-28 DIAGNOSIS — Z23 Encounter for immunization: Secondary | ICD-10-CM | POA: Diagnosis not present

## 2020-08-21 ENCOUNTER — Other Ambulatory Visit: Payer: Self-pay | Admitting: Internal Medicine

## 2020-08-21 DIAGNOSIS — G8929 Other chronic pain: Secondary | ICD-10-CM

## 2020-08-21 DIAGNOSIS — G4701 Insomnia due to medical condition: Secondary | ICD-10-CM

## 2020-08-26 ENCOUNTER — Ambulatory Visit: Payer: Medicare Other | Admitting: Internal Medicine

## 2020-08-26 ENCOUNTER — Other Ambulatory Visit: Payer: Self-pay

## 2020-08-31 DIAGNOSIS — M1612 Unilateral primary osteoarthritis, left hip: Secondary | ICD-10-CM | POA: Diagnosis not present

## 2020-09-06 ENCOUNTER — Ambulatory Visit (INDEPENDENT_AMBULATORY_CARE_PROVIDER_SITE_OTHER): Payer: Medicare Other | Admitting: Internal Medicine

## 2020-09-06 ENCOUNTER — Other Ambulatory Visit: Payer: Self-pay

## 2020-09-06 ENCOUNTER — Encounter: Payer: Self-pay | Admitting: Internal Medicine

## 2020-09-06 VITALS — BP 118/68 | HR 76 | Temp 98.5°F | Resp 18 | Ht 66.25 in | Wt 129.8 lb

## 2020-09-06 DIAGNOSIS — I10 Essential (primary) hypertension: Secondary | ICD-10-CM

## 2020-09-06 DIAGNOSIS — F3341 Major depressive disorder, recurrent, in partial remission: Secondary | ICD-10-CM

## 2020-09-06 DIAGNOSIS — G8929 Other chronic pain: Secondary | ICD-10-CM

## 2020-09-06 DIAGNOSIS — E7841 Elevated Lipoprotein(a): Secondary | ICD-10-CM

## 2020-09-06 DIAGNOSIS — G4701 Insomnia due to medical condition: Secondary | ICD-10-CM | POA: Diagnosis not present

## 2020-09-06 DIAGNOSIS — G4700 Insomnia, unspecified: Secondary | ICD-10-CM | POA: Diagnosis not present

## 2020-09-06 DIAGNOSIS — Z01810 Encounter for preprocedural cardiovascular examination: Secondary | ICD-10-CM

## 2020-09-06 DIAGNOSIS — M153 Secondary multiple arthritis: Secondary | ICD-10-CM | POA: Diagnosis not present

## 2020-09-06 DIAGNOSIS — E559 Vitamin D deficiency, unspecified: Secondary | ICD-10-CM

## 2020-09-06 DIAGNOSIS — Z8349 Family history of other endocrine, nutritional and metabolic diseases: Secondary | ICD-10-CM | POA: Diagnosis not present

## 2020-09-06 DIAGNOSIS — G894 Chronic pain syndrome: Secondary | ICD-10-CM | POA: Diagnosis not present

## 2020-09-06 LAB — COMPREHENSIVE METABOLIC PANEL
ALT: 10 U/L (ref 0–35)
AST: 17 U/L (ref 0–37)
Albumin: 4.4 g/dL (ref 3.5–5.2)
Alkaline Phosphatase: 35 U/L — ABNORMAL LOW (ref 39–117)
BUN: 22 mg/dL (ref 6–23)
CO2: 33 mEq/L — ABNORMAL HIGH (ref 19–32)
Calcium: 9.8 mg/dL (ref 8.4–10.5)
Chloride: 99 mEq/L (ref 96–112)
Creatinine, Ser: 0.69 mg/dL (ref 0.40–1.20)
GFR: 82.26 mL/min (ref >60.00)
Glucose, Bld: 84 mg/dL (ref 70–99)
Potassium: 4.2 mEq/L (ref 3.5–5.1)
Sodium: 138 mEq/L (ref 135–145)
Total Bilirubin: 0.3 mg/dL (ref 0.2–1.2)
Total Protein: 7 g/dL (ref 6.0–8.3)

## 2020-09-06 LAB — CBC
HCT: 37.3 % (ref 36.0–46.0)
Hemoglobin: 12.4 g/dL (ref 12.0–15.0)
MCHC: 33.2 g/dL (ref 30.0–36.0)
MCV: 88.6 fl (ref 78.0–100.0)
Platelets: 201 10*3/uL (ref 150.0–400.0)
RBC: 4.21 Mil/uL (ref 3.87–5.11)
RDW: 13.9 % (ref 11.5–15.5)
WBC: 6.2 10*3/uL (ref 4.0–10.5)

## 2020-09-06 LAB — T4, FREE: Free T4: 0.93 ng/dL (ref 0.60–1.60)

## 2020-09-06 LAB — TSH: TSH: 1.74 u[IU]/mL (ref 0.35–5.50)

## 2020-09-06 LAB — LIPID PANEL
Cholesterol: 178 mg/dL (ref 0–200)
HDL: 52.4 mg/dL (ref >39.00)
LDL Cholesterol: 107 mg/dL — ABNORMAL HIGH (ref 0–99)
NonHDL: 125.18
Total CHOL/HDL Ratio: 3
Triglycerides: 92 mg/dL (ref 0.0–149.0)
VLDL: 18.4 mg/dL (ref 0.0–40.0)

## 2020-09-06 MED ORDER — ZOLPIDEM TARTRATE 5 MG PO TABS: 5.0000 mg | ORAL_TABLET | Freq: Every evening | ORAL | 1 refills | Status: DC | PRN

## 2020-09-06 MED ORDER — ATORVASTATIN CALCIUM 40 MG PO TABS: 40.0000 mg | ORAL_TABLET | Freq: Every day | ORAL | 3 refills | Status: DC

## 2020-09-06 MED ORDER — LORAZEPAM 1 MG PO TABS: 1.0000 mg | ORAL_TABLET | Freq: Every day | ORAL | 0 refills | Status: DC

## 2020-09-06 MED ORDER — TRAMADOL-ACETAMINOPHEN 37.5-325 MG PO TABS: 1.0000 | ORAL_TABLET | Freq: Two times a day (BID) | ORAL | 2 refills | Status: DC | PRN

## 2020-09-06 MED ORDER — ESCITALOPRAM OXALATE 20 MG PO TABS: 20.0000 mg | ORAL_TABLET | Freq: Every day | ORAL | 3 refills | Status: DC

## 2020-09-06 NOTE — Patient Instructions (Addendum)
We will clear you for the surgery and have done the refills today.

## 2020-09-06 NOTE — Progress Notes (Signed)
   Subjective:   Patient ID: Hailey [name], female    DOB: 28-Feb-1940, 80 y.o.   MRN: [id number]  HPI The patient is a 80 YO female coming in for being out of medications due to no visit with provider since March 2021. Last wellness visit 2020. She is using tramadol again for pain. Last visit with provider she was taking hydrocodone due to several surgeries. She is also needing pre-op visit today for upcoming left hip replacement. She is limited on activity due to pain. Denies chest pains. Denies SOB. Not able to exert much. Also needing refill on lorazepam and ambien which she takes in the evening for sleep only.   Review of Systems  Constitutional:  Positive for activity change.  HENT: Negative.    Eyes: Negative.   Respiratory:  Negative for cough, chest tightness and shortness of breath.   Cardiovascular:  Negative for chest pain, palpitations and leg swelling.  Gastrointestinal:  Negative for abdominal distention, abdominal pain, constipation, diarrhea, nausea and vomiting.  Musculoskeletal:  Positive for arthralgias, back pain and myalgias.  Skin: Negative.   Neurological: Negative.   Psychiatric/Behavioral:  Positive for sleep disturbance.    Objective:  Physical Exam Constitutional:      Appearance: She is well-developed.  HENT:     Head: Normocephalic and atraumatic.  Cardiovascular:     Rate and Rhythm: Normal rate and regular rhythm.  Pulmonary:     Effort: Pulmonary effort is normal. No respiratory distress.     Breath sounds: Normal breath sounds. No wheezing or rales.  Abdominal:     General: Bowel sounds are normal. There is no distension.     Palpations: Abdomen is soft.     Tenderness: There is no abdominal tenderness. There is no rebound.  Musculoskeletal:        General: Tenderness present.     Cervical back: Normal range of motion.  Skin:    General: Skin is warm and dry.  Neurological:     Mental Status: She is alert and oriented to person, place, and time.      Coordination: Coordination normal.    Vitals:   09/06/20 1515  BP: 118/68  Pulse: 76  Resp: 18  Temp: 98.5 F (36.9 C)  TempSrc: Oral  SpO2: 99%  Weight: 129 lb 12.8 oz (58.9 kg)  Height: 5' 6.25" (1.683 m)    This visit occurred during the SARS-CoV-2 public health emergency.  Safety protocols were in place, including screening questions prior to the visit, additional usage of staff PPE, and extensive cleaning of exam room while observing appropriate contact time as indicated for disinfecting solutions.   Assessment & Plan:

## 2020-09-07 LAB — VITAMIN D 25 HYDROXY (VIT D DEFICIENCY, FRACTURES): VITD: 70.23 ng/mL (ref 30.00–100.00)

## 2020-09-09 ENCOUNTER — Other Ambulatory Visit: Payer: Self-pay

## 2020-09-09 ENCOUNTER — Encounter: Payer: Self-pay | Admitting: Cardiology

## 2020-09-09 ENCOUNTER — Ambulatory Visit: Payer: Medicare Other | Admitting: Cardiology

## 2020-09-09 VITALS — BP 115/74 | HR 78 | Resp 16 | Ht 66.0 in | Wt 127.0 lb

## 2020-09-09 DIAGNOSIS — Z8349 Family history of other endocrine, nutritional and metabolic diseases: Secondary | ICD-10-CM | POA: Insufficient documentation

## 2020-09-09 DIAGNOSIS — F32A Depression, unspecified: Secondary | ICD-10-CM | POA: Insufficient documentation

## 2020-09-09 DIAGNOSIS — Z01810 Encounter for preprocedural cardiovascular examination: Secondary | ICD-10-CM

## 2020-09-09 NOTE — Assessment & Plan Note (Signed)
In partial remission, some symptoms remain due to chronic pain. She is satisfied with lexapro 20 mg daily and this is refilled today.

## 2020-09-09 NOTE — Assessment & Plan Note (Signed)
She declines EKG today as she is seeing cardiology later this week. Without current EKG will await that for clearance. Her metabolic activity is decreased due to pain so unable to assess for angina. She does not have any angina or SOB at her current activity level. Checking labs as none recent with CMP and CBC for any additional risk factors. BP is at goal. Seems reasonable if EKG normal to proceed with hip replacement surgery without further evaluation.

## 2020-09-09 NOTE — Progress Notes (Signed)
Patient referred by Hoyt Koch, * for pre-op evaluation  Subjective:   Hailey [name], female    DOB: 12-Oct-1940, 80 y.o.   MRN: [id number]   Chief Complaint  Patient presents with   Pre-op Exam   New Patient (Initial Visit)     HPI  80 y.o. Caucasian female with hypertension, OSAm referred for pre-op evaluation prior to lt total hip replacement  Patient lives alone with her cat.  She has had multiple orthopedic issues over the years, leading to multiple surgeries.  Most recently, she had a spinal surgery under general anesthesia and 2021.  Patient was previously followed by [facility] heart care cardiologist Dr. Oval Linsey, last seen in 2018.  She underwent echocardiogram and stress test in 2016, which showed tissue attenuation versus old inferior infarct.  Stress test was deemed low risk.  There has been no change in past few years with regards to any new symptoms of chest pain or shortness of breath.  Her lifestyle remains limited due to her severe hip pain.  She is hoping to undergo left hip replacement by Dr. Percell Miller soon.   Past Medical History:  Diagnosis Date   Allergic rhinitis, cause unspecified    Carotid artery disease ([place]) 12/13/2014   1-39% stenosis of bilateral CCAs.   Repeat ultrasound 11/2016.   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   OSA (obstructive sleep apnea)    Osteoarthrosis, unspecified whether generalized or localized, unspecified site    Other and unspecified hyperlipidemia    Personal history of colonic adenomas 11/28/2012   Personal history of other disorders of nervous system and sense organs    SAH (subarachnoid hemorrhage) ([place]) 02/02/2014   Unspecified closed  fracture of pelvis    x 2 2004/2006   Unspecified essential hypertension      Past Surgical History:  Procedure Laterality Date   ANKLE FUSION Left    BACK SURGERY     ; has  had 8 back surgeries   CERVICAL FUSION     C4-7   COLONOSCOPY     CYSTOSCOPY     HERNIA REPAIR     umbilical   LUMBAR FUSION     L2-3   REVERSE SHOULDER ARTHROPLASTY Left 12/10/2017   Procedure: LEFT REVERSE SHOULDER ARTHROPLASTY;  Surgeon: Marchia Bond, MD;  Location: [facility];  Service: Orthopedics;  Laterality: Left;  Interscalene Block   SACRAL FUSION  07/2012   TIBIA FRACTURE SURGERY Right    TONSILLECTOMY     TOTAL HIP ARTHROPLASTY Right 2012   TUBAL LIGATION     UPPER GASTROINTESTINAL ENDOSCOPY       Social History   Tobacco Use  Smoking Status Former   Packs/day: 2.00   Years: 10.00   Pack years: 20.00   Types: Cigarettes   Quit date: 02/13/1967   Years since quitting: 53.6  Smokeless Tobacco Never  Tobacco Comments   smoked 1961-1969 , up to 3 ppd, mainly 2 ppd    Social History   Substance and Sexual Activity  Alcohol Use No     Family History  Problem Relation Age of Onset   Dementia Mother    Hypertension Father  Heart attack Father    Allergic rhinitis Father    Cancer Maternal Aunt        breast cancer   Alzheimer's disease Maternal Aunt    Stroke Paternal Uncle    Cancer Cousin        breast cancer   Colon cancer Neg Hx    Angioedema Neg Hx    Asthma Neg Hx    Eczema Neg Hx      Current Outpatient Medications on File Prior to Visit  Medication Sig Dispense Refill   atorvastatin (LIPITOR) 40 MG tablet Take 1 tablet (40 mg total) by mouth daily. 90 tablet 3   B Complex Vitamins (B COMPLEX PO) Take 1 tablet by mouth daily.     Bacillus Coagulans-Inulin (PROBIOTIC-PREBIOTIC PO) Take 1 capsule by mouth daily.     Calcium Carbonate-Vitamin D (CALCIUM + D PO) Take 1,200 mg by mouth daily.      Coenzyme Q10 (CO Q 10) 100 MG CAPS Take 200 mg by mouth daily.      denosumab (PROLIA) 60 MG/ML SOLN injection Inject 60 mg into the skin every 6 (six) months. Administer in upper arm, thigh, or abdomen     DHA-EPA-Flaxseed Oil-Vitamin E (THERA TEARS NUTRITION PO) Place 1 drop into both eyes daily as needed (dry eyes).     diclofenac sodium (VOLTAREN) 1 % GEL Apply 2 g topically daily as needed (pain).     diphenhydrAMINE HCl, Sleep, (ZZZQUIL PO) Take 1 tablet by mouth daily.     escitalopram (LEXAPRO) 20 MG tablet Take 1 tablet (20 mg total) by mouth daily. 90 tablet 3   ferrous sulfate 325 (65 FE) MG tablet Take 325 mg by mouth daily with breakfast.       fexofenadine (ALLEGRA) 180 MG tablet Take 180 mg by mouth daily.       fluticasone (FLONASE) 50 MCG/ACT nasal spray Use 1 spray each nostril 1-2 times daily as needed (Patient taking differently: Place 2 sprays into both nostrils 2 (two) times daily. Use 1 spray each nostril 1-2 times daily as needed) 16 g 5   hydrocortisone (ANUSOL-HC) 2.5 % rectal cream Place 1 application rectally 2 (two) times daily. 30 g 0   lidocaine (LIDODERM) 5 % Place 1 patch onto the skin daily. Remove & Discard patch within 12 hours or as directed by MD     LORazepam (ATIVAN) 1 MG tablet Take 1 tablet (1 mg total) by mouth at bedtime. 90 tablet 0   Misc Natural Products (OSTEO BI-FLEX TRIPLE STRENGTH PO) Take 1 tablet by mouth 2 (two) times daily.      Multiple Vitamin (MULTIVITAMIN PO) Take 1 tablet by mouth daily.      traMADol-acetaminophen (ULTRACET) 37.5-325 MG tablet Take 1 tablet by mouth 2 (two) times daily as needed. This is a 30 day supply 45 tablet 2   Turmeric 500 MG TABS Take 1,500 mg by mouth daily.     vitamin E 400 UNIT capsule Take 400 Units by mouth daily.     zolpidem (AMBIEN) 5 MG tablet Take 1 tablet (5 mg total) by mouth at bedtime as needed. 90 tablet 1   No current facility-administered medications on file prior to visit.    Cardiovascular and other pertinent studies:  Echocardiogram 12/23/2014: - Left  ventricle: The cavity size was normal. Wall thickness was    normal. Systolic function was normal. The estimated ejection    fraction was in the range of 55% to 60%. Wall  motion was normal;    there were no regional wall motion abnormalities. Doppler    parameters are consistent with abnormal left ventricular    relaxation (grade 1 diastolic dysfunction).  - Aortic valve: Trileaflet; mildly thickened, mildly calcified    leaflets.  - Aorta: The aorta was mildly dilated. Ascending aortic diameter:    38 mm (S).  - Mitral valve: Calcified annulus.   Stress test 12/07/2014: Nuclear stress EF: 67%. The left ventricular ejection fraction is hyperdynamic (>65%). There was no ST segment deviation noted during stress. This is a low risk study.   Technically suboptimal due to subdiaphragmatic activity; low risk stress nuclear study with a medium size, severe intensity, fixed inferior apical defect suggestive of prior infarct; no ischemia; EF 67 with normal wall motion.    EKG 09/09/2020: Sinus rhythm 91 bpm  Lov voltage limg leads Nonspecific T-abnormality   Recent labs: 08/17/2020: Glucose 84, BUN/Cr 22/0.69. EGFR 82. Na/K 138/4.2. Rest of the CMP normal H/H 12/38. MCV 88. Platelets 201 HbA1C N/A Chol 178, TG 92, HDL 52, LDL 107 TSH 1.7 normal    Review of Systems  Cardiovascular:  Negative for chest pain, dyspnea on exertion, leg swelling, palpitations and syncope.  Musculoskeletal:  Positive for back pain and joint pain.        Vitals:   09/09/20 1319  BP: 115/74  Pulse: 78  Resp: 16  SpO2: 94%     Body mass index is 20.5 kg/m. Filed Weights   09/09/20 1319  Weight: 127 lb (57.6 kg)     Objective:   Physical Exam Vitals and nursing note reviewed.  Constitutional:      General: She is not in acute distress. Neck:     Vascular: No JVD.  Cardiovascular:     Rate and Rhythm: Normal rate and regular rhythm.     Heart sounds: Normal heart sounds. No murmur  heard. Pulmonary:     Effort: Pulmonary effort is normal.     Breath sounds: Normal breath sounds. No wheezing or rales.  Musculoskeletal:     Right lower leg: No edema.     Left lower leg: No edema.        Assessment & Recommendations:   80 y.o. Caucasian female with hypertension, controlled hyperlipidemia, OSA, referred for pre-op evaluation prior to lt total hip replacement  Preop risk stratification: No current angina or heart failure symptoms.  Unchanged EKG over several years.  Low risk stress test and echocardiogram in 2016 with no new symptoms.  Multiple orthopedic surgeries most recently in 2021, without any perioperative cardiac events.  High perioperative cardiac risk is acceptable for planned hip surgery.  I do not think any further cardiac testing will change her perioperative risk, which remains mildly elevated owing to her age.  Okay to proceed with hip surgery.  Thank you for referring the patient to [place]. Please feel free to contact with any questions.   Nigel Mormon, MD Pager: [phone] Office: [phone]

## 2020-09-09 NOTE — Assessment & Plan Note (Signed)
Needs labs so checking lipid panel and adjust atorvastatin 40 mg daily as needed.

## 2020-09-09 NOTE — Assessment & Plan Note (Signed)
She is needing clearance for hip replacement. Will be having visit with cardiology later this week.

## 2020-09-09 NOTE — Assessment & Plan Note (Signed)
Concern for abnormal levels given fatigue. Checking TSH and free T4.

## 2020-09-09 NOTE — Assessment & Plan Note (Signed)
She is having left hip replacement in the near future. She is currently taking tramadol/APAP for pain. I did check [place] controlled substance database and UDS today. It appears based on fill pattern that she is taking this mostly 1 pill per day which she agrees with and rarely takes a second. Previously another provider had been prescribing 60 per month which she was filling around every 2 months. We will decrease to 45 pills per month and depending on fill pattern may reduce further once usage has stabilized after hip replacement. She will need close follow up due to her multiple controlled substances. She is informed of risk of oversedation and memory change and risk of fall.

## 2020-09-09 NOTE — Assessment & Plan Note (Signed)
We did discuss this as she is taking lorazepam (1 mg) and ambien (5 mg) at bedtime along with her tramadol at bedtime and this puts her at risk for oversedation, falls, memory changes. She did acknowledge this and feels that her QOL is much poorer without her medications. Will refill lorazepam and ambien for sleep and needs close follow up.

## 2020-09-09 NOTE — Assessment & Plan Note (Signed)
BP is at goal off medications currently. Checking CMP.

## 2020-09-14 DIAGNOSIS — Z20822 Contact with and (suspected) exposure to covid-19: Secondary | ICD-10-CM | POA: Diagnosis not present

## 2020-09-26 DIAGNOSIS — M1612 Unilateral primary osteoarthritis, left hip: Secondary | ICD-10-CM | POA: Diagnosis not present

## 2020-09-27 ENCOUNTER — Other Ambulatory Visit: Payer: Self-pay | Admitting: Orthopedic Surgery

## 2020-10-04 NOTE — Patient Instructions (Signed)
DUE TO COVID-19 ONLY ONE VISITOR IS ALLOWED TO COME WITH YOU AND STAY IN THE WAITING ROOM ONLY DURING PRE OP AND PROCEDURE.   **NO VISITORS ARE ALLOWED IN THE SHORT STAY AREA OR RECOVERY ROOM!!**  IF YOU WILL BE ADMITTED INTO THE HOSPITAL YOU ARE ALLOWED ONLY TWO SUPPORT PEOPLE DURING VISITATION HOURS ONLY (10AM -8PM)   The support person(s) may change daily. The support person(s) must pass our screening, gel in and out, and wear a mask at all times, including in the patient's room. Patients must also wear a mask when staff or their support person are in the room.  No visitors under the age of 60. Any visitor under the age of 32 must be accompanied by an adult.    COVID SWAB TESTING MUST BE COMPLETED ON:  10/14/20 **MUST PRESENT COMPLETED FORM AT TESTING SITE**    [place] (backside of the building) You are not required to quarantine, however you are required to wear a well-fitted mask when you are out and around people not in your household.  Hand Hygiene often Do NOT share personal items Notify your provider if you are in close contact with someone who has COVID or you develop fever 100.4 or greater, new onset of sneezing, cough, sore throat, shortness of breath or body aches.  [address], [address], must go inside of the hospital, NOT A DRIVE THRU!  (Must self quarantine after testing. Follow instructions on handout.)       Your procedure is scheduled on: 10/18/20   Report to [facility] Main  Entrance    Report to admitting at : 7:15 AM   Call this number if you have problems the morning of surgery [phone]   Do not eat food :After Midnight.   May have liquids until : 7:00 AM   day of surgery  CLEAR LIQUID DIET  Foods Allowed                                                                     Foods Excluded  Water, Black Coffee and tea, regular and decaf                              liquids that you cannot  Plain Jell-O in any flavor  (No red)                                           see through such as: Fruit ices (not with fruit pulp)                                     milk, soups, orange juice              Iced Popsicles (No red)  All solid food                                   Apple juices Sports drinks like Gatorade (No red) Lightly seasoned clear broth or consume(fat free) Sugar, honey syrup  Sample Menu Breakfast                                Lunch                                     Supper Cranberry juice                    Beef broth                            Chicken broth Jell-O                                     Grape juice                           Apple juice Coffee or tea                        Jell-O                                      Popsicle                                                Coffee or tea                        Coffee or tea      Complete one Ensure drink the morning of surgery at: 7:00 AM       the day of surgery.   The day of surgery:  Drink ONE (1) Pre-Surgery Clear Ensure or G2 by am the morning of surgery. Drink in one sitting. Do not sip.  This drink was given to you during your hospital  pre-op appointment visit. Nothing else to drink after completing the  Pre-Surgery Clear Ensure or G2.          If you have questions, please contact your surgeon's office.     Oral Hygiene is also important to reduce your risk of infection.                                    Remember - BRUSH YOUR TEETH THE MORNING OF SURGERY WITH YOUR REGULAR TOOTHPASTE   Do NOT smoke after Midnight   Take these medicines the morning of surgery with A SIP OF WATER: escitalopram,allegra.Use flonase as usual.  You may not have any metal on your body including hair pins, jewelry, and body piercing             Do not wear make-up, lotions, powders, perfumes/cologne, or  deodorant  Do not wear nail polish including gel and S&S, artificial/acrylic nails, or any other type of covering on natural nails including finger and toenails. If you have artificial nails, gel coating, etc. that needs to be removed by a nail salon please have this removed prior to surgery or surgery may need to be canceled/ delayed if the surgeon/ anesthesia feels like they are unable to be safely monitored.   Do not shave  48 hours prior to surgery.    Do not bring valuables to the hospital. [place].   Contacts, dentures or bridgework may not be worn into surgery.   Bring small overnight bag day of surgery.    Patients discharged the day of surgery will not be allowed to drive home.   Special Instructions: Bring a copy of your healthcare power of attorney and living will documents         the day of surgery if you haven't scanned them in before.              Please read over the following fact sheets you were given: IF YOU HAVE QUESTIONS ABOUT YOUR PRE OP INSTRUCTIONS PLEASE CALL [phone]   [facility] - Preparing for Surgery Before surgery, you can play an important role.  Because skin is not sterile, your skin needs to be as free of germs as possible.  You can reduce the number of germs on your skin by washing with CHG (chlorahexidine gluconate) soap before surgery.  CHG is an antiseptic cleaner which kills germs and bonds with the skin to continue killing germs even after washing. Please DO NOT use if you have an allergy to CHG or antibacterial soaps.  If your skin becomes reddened/irritated stop using the CHG and inform your nurse when you arrive at Short Stay. Do not shave (including legs and underarms) for at least 48 hours prior to the first CHG shower.  You may shave your face/neck. Please follow these instructions carefully:  1.  Shower with CHG Soap the night before surgery and the  morning of Surgery.  2.  If you choose to  wash your hair, wash your hair first as usual with your  normal  shampoo.  3.  After you shampoo, rinse your hair and body thoroughly to remove the  shampoo.                           4.  Use CHG as you would any other liquid soap.  You can apply chg directly  to the skin and wash                       Gently with a scrungie or clean washcloth.  5.  Apply the CHG Soap to your body ONLY FROM THE NECK DOWN.   Do not use on face/ open                           Wound or open sores. Avoid contact with eyes, ears mouth and genitals (private parts).  Wash face,  Genitals (private parts) with your normal soap.             6.  Wash thoroughly, paying special attention to the area where your surgery  will be performed.  7.  Thoroughly rinse your body with warm water from the neck down.  8.  DO NOT shower/wash with your normal soap after using and rinsing off  the CHG Soap.                9.  Pat yourself dry with a clean towel.            10.  Wear clean pajamas.            11.  Place clean sheets on your bed the night of your first shower and do not  sleep with pets. Day of Surgery : Do not apply any lotions/deodorants the morning of surgery.  Please wear clean clothes to the hospital/surgery center.  FAILURE TO FOLLOW THESE INSTRUCTIONS MAY RESULT IN THE CANCELLATION OF YOUR SURGERY PATIENT SIGNATURE_________________________________  NURSE SIGNATURE__________________________________  ________________________________________________________________________   Adam Phenix  An incentive spirometer is a tool that can help keep your lungs clear and active. This tool measures how well you are filling your lungs with each breath. Taking long deep breaths may help reverse or decrease the chance of developing breathing (pulmonary) problems (especially infection) following: A long period of time when you are unable to move or be active. BEFORE THE PROCEDURE  If the spirometer  includes an indicator to show your best effort, your nurse or respiratory therapist will set it to a desired goal. If possible, sit up straight or lean slightly forward. Try not to slouch. Hold the incentive spirometer in an upright position. INSTRUCTIONS FOR USE  Sit on the edge of your bed if possible, or sit up as far as you can in bed or on a chair. Hold the incentive spirometer in an upright position. Breathe out normally. Place the mouthpiece in your mouth and seal your lips tightly around it. Breathe in slowly and as deeply as possible, raising the piston or the ball toward the top of the column. Hold your breath for 3-5 seconds or for as long as possible. Allow the piston or ball to fall to the bottom of the column. Remove the mouthpiece from your mouth and breathe out normally. Rest for a few seconds and repeat Steps 1 through 7 at least 10 times every 1-2 hours when you are awake. Take your time and take a few normal breaths between deep breaths. The spirometer may include an indicator to show your best effort. Use the indicator as a goal to work toward during each repetition. After each set of 10 deep breaths, practice coughing to be sure your lungs are clear. If you have an incision (the cut made at the time of surgery), support your incision when coughing by placing a pillow or rolled up towels firmly against it. Once you are able to get out of bed, walk around indoors and cough well. You may stop using the incentive spirometer when instructed by your caregiver.  RISKS AND COMPLICATIONS Take your time so you do not get dizzy or light-headed. If you are in pain, you may need to take or ask for pain medication before doing incentive spirometry. It is harder to take a deep breath if you are having pain. AFTER USE Rest and breathe slowly and easily. It can be helpful to keep track of  a log of your progress. Your caregiver can provide you with a simple table to help with this. If you are  using the spirometer at home, follow these instructions: [place] IF:  You are having difficultly using the spirometer. You have trouble using the spirometer as often as instructed. Your pain medication is not giving enough relief while using the spirometer. You develop fever of 100.5 F (38.1 C) or higher. SEEK IMMEDIATE MEDICAL CARE IF:  You cough up bloody sputum that had not been present before. You develop fever of 102 F (38.9 C) or greater. You develop worsening pain at or near the incision site. MAKE SURE YOU:  Understand these instructions. Will watch your condition. Will get help right away if you are not doing well or get worse. Document Released: 06/11/2006 Document Revised: 04/23/2011 Document Reviewed: 08/12/2006 [facility] Patient Information 2014 [place], [place].   ________________________________________________________________________

## 2020-10-05 ENCOUNTER — Encounter (HOSPITAL_COMMUNITY): Payer: Self-pay

## 2020-10-05 ENCOUNTER — Encounter (HOSPITAL_COMMUNITY)
Admission: RE | Admit: 2020-10-05 | Discharge: 2020-10-05 | Disposition: A | Discharge status: Home / Self Care | Payer: Medicare Other | Source: Ambulatory Visit | Attending: Orthopedic Surgery | Admitting: Orthopedic Surgery

## 2020-10-05 ENCOUNTER — Other Ambulatory Visit: Payer: Self-pay

## 2020-10-05 DIAGNOSIS — Z01812 Encounter for preprocedural laboratory examination: Secondary | ICD-10-CM | POA: Insufficient documentation

## 2020-10-05 HISTORY — DX: Depression, unspecified: F32.A

## 2020-10-05 LAB — CBC
HCT: 38.2 % (ref 36.0–46.0)
Hemoglobin: 12.4 g/dL (ref 12.0–15.0)
MCH: 30.1 pg (ref 26.0–34.0)
MCHC: 32.5 g/dL (ref 30.0–36.0)
MCV: 92.7 fL (ref 80.0–100.0)
Platelets: 203 10*3/uL (ref 150–400)
RBC: 4.12 MIL/uL (ref 3.87–5.11)
RDW: 13.1 % (ref 11.5–15.5)
WBC: 4.7 10*3/uL (ref 4.0–10.5)
nRBC: 0 % (ref 0.0–0.2)

## 2020-10-05 LAB — BASIC METABOLIC PANEL
Anion gap: 8 (ref 5–15)
BUN: 21 mg/dL (ref 8–23)
CO2: 32 mmol/L (ref 22–32)
Calcium: 9.4 mg/dL (ref 8.9–10.3)
Chloride: 100 mmol/L (ref 98–111)
Creatinine, Ser: 0.67 mg/dL (ref 0.44–1.00)
GFR, Estimated: >60 mL/min (ref >60)
Glucose, Bld: 120 mg/dL — ABNORMAL HIGH (ref 70–99)
Potassium: 3.9 mmol/L (ref 3.5–5.1)
Sodium: 140 mmol/L (ref 135–145)

## 2020-10-05 LAB — SURGICAL PCR SCREEN
MRSA, PCR: NEGATIVE
Staphylococcus aureus: NEGATIVE

## 2020-10-05 NOTE — Progress Notes (Addendum)
COVID Vaccine Completed: Yes Date COVID Vaccine completed: 07/28/20. X 4 COVID vaccine manufacturer:   Oralia Rud Test: 10/14/20 PCP - Dr. Pricilla Holm Cardiologist - Dr. Jacqulyn Liner: Clearance: 09/09/20: EPIC  Chest x-ray -  EKG - 09/09/20 Stress Test -  ECHO - 12/23/14 Cardiac Cath -  Pacemaker/ICD device last checked:  Sleep Study - Yes CPAP - NO  Fasting Blood Sugar -  Checks Blood Sugar _____ times a day  Blood Thinner Instructions: Aspirin Instructions: Last Dose:  Anesthesia review: Hx: HTN,OSA(No CPAP)  Patient denies shortness of breath, fever, cough and chest pain at PAT appointment   Patient verbalized understanding of instructions that were given to them at the PAT appointment. Patient was also instructed that they will need to review over the PAT instructions again at home before surgery.

## 2020-10-07 NOTE — H&P (Signed)
HIP ARTHROPLASTY ADMISSION H&P  Patient ID: Hailey [name] MRN: [id number] DOB/AGE: Mar 23, 1940 80 y.o.  Chief Complaint: left hip pain.  Planned Procedure Date: 10/18/20 Medical Clearance by Dr. Sharlet Salina   Cardiac Clearance by Dr. Virgina Jock   HPI: Hailey [name] is a 80 y.o. 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 The patient has a history of pain and functional disability in the left hip due to arthritis and has failed non-surgical conservative treatments for greater than 12 weeks to include NSAID's and/or analgesics, corticosteriod injections, supervised PT with diminished ADL's post treatment, use of assistive devices, and activity modification.  Onset of symptoms was gradual, starting 1 years ago with gradually worsening course since that time. The patient noted no past surgery on the left hip.  Patient currently rates pain at 10 out of 10 with activity. Patient has night pain, worsening of pain with activity and weight bearing, and pain that interferes with activities of daily living.  Patient has evidence of subchondral sclerosis, periarticular osteophytes, and joint space narrowing by imaging studies.  There is no active infection.  Past Medical History:  Diagnosis Date   Allergic rhinitis, cause unspecified    Carotid artery disease ([place]) 12/13/2014   1-39% stenosis of bilateral CCAs.   Repeat ultrasound 11/2016.   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   OSA (obstructive sleep apnea)    Osteoarthrosis, unspecified whether generalized or localized, unspecified site    Other and unspecified hyperlipidemia    Personal history of colonic adenomas 11/28/2012   Personal history of  other disorders of nervous system and sense organs    SAH (subarachnoid hemorrhage) ([place]) 02/02/2014   Unspecified closed fracture of pelvis    x 2 2004/2006   Unspecified essential hypertension    Past Surgical History:  Procedure Laterality Date   ANKLE FUSION Left    BACK SURGERY     ; has  had 8 back surgeries   CERVICAL FUSION     C4-7   COLONOSCOPY     CYSTOSCOPY     HERNIA REPAIR     umbilical   LUMBAR FUSION     L2-3   REVERSE SHOULDER ARTHROPLASTY Left 12/10/2017   Procedure: LEFT REVERSE SHOULDER ARTHROPLASTY;  Surgeon: Marchia Bond, MD;  Location: [facility];  Service: Orthopedics;  Laterality: Left;  Interscalene Block   SACRAL FUSION  07/2012   TIBIA FRACTURE SURGERY Right    TONSILLECTOMY     TOTAL HIP ARTHROPLASTY Right 2012   TUBAL LIGATION     UPPER GASTROINTESTINAL ENDOSCOPY     Allergies  Allergen Reactions   Resorcinol Swelling and Other (See Comments)    blisters   Prior to Admission medications   Medication Sig Start Date End Date Taking? Authorizing Provider  acetaminophen (TYLENOL) 500 MG tablet Take 1,000 mg by mouth every 6 (six) hours as needed for moderate pain or headache.   Yes [provider]  Ascorbic Acid (VITAMIN C WITH ROSE HIPS) 1000 MG tablet Take 1,000 mg by mouth daily.   Yes [provider]  atorvastatin (LIPITOR) 40 MG tablet Take 1 tablet (40 mg total) by mouth daily. 09/06/20  Yes Hoyt Koch, MD  B Complex Vitamins (B COMPLEX PO) Take 1 tablet by mouth daily.   Yes [provider]  Calcium Carbonate-Vit D-Min (CALCIUM 1200) 1200-1000 MG-UNIT CHEW Chew 1 tablet by mouth daily.   Yes [provider]  cholecalciferol (VITAMIN D3) 25 MCG (1000 UNIT) tablet Take 1,000 Units by mouth daily.   Yes [provider]  Coenzyme Q10 (COQ10) 400 MG CAPS Take 400 mg by mouth daily.   Yes [provider]  denosumab (PROLIA) 60 MG/ML SOLN injection Inject 60 mg into the skin every 6 (six)  months. Administer in upper arm, thigh, or abdomen   Yes [provider]  diclofenac sodium (VOLTAREN) 1 % GEL Apply 2 g topically at bedtime.   Yes [provider]  diphenhydrAMINE (BENADRYL) 25 MG tablet Take 25 mg by mouth daily.   Yes [provider]  diphenhydrAMINE HCl, Sleep, (ZZZQUIL) 25 MG CAPS Take 50 mg by mouth at bedtime.   Yes [provider]  escitalopram (LEXAPRO) 20 MG tablet Take 1 tablet (20 mg total) by mouth daily. 09/06/20  Yes Hoyt Koch, MD  Ferrous Sulfate (IRON PO) Take 25 mg by mouth daily.   Yes [provider]  fluticasone (FLONASE) 50 MCG/ACT nasal spray Use 1 spray each nostril 1-2 times daily as needed Patient taking differently: Place 1 spray into both nostrils 2 (two) times daily. 02/14/16  Yes Bobbitt, [place] Muta, MD  LORazepam (ATIVAN) 1 MG tablet Take 1 tablet (1 mg total) by mouth at bedtime. 09/06/20  Yes Hoyt Koch, MD  Misc Natural Products (OSTEO BI-FLEX TRIPLE STRENGTH PO) Take 1 tablet by mouth daily.   Yes [provider]  Multiple Vitamin (MULTIVITAMIN PO) Take 1 tablet by mouth daily.    Yes [provider]  OVER THE COUNTER MEDICATION Take 750 mg by mouth at bedtime. CBD gummies   Yes [provider]  traMADol-acetaminophen (ULTRACET) 37.5-325 MG tablet Take 1 tablet by mouth 2 (two) times daily as needed. This is a 30 day supply Patient taking differently: Take 1 tablet by mouth at bedtime. This is a 30 day supply 09/06/20  Yes Hoyt Koch, MD  TURMERIC CURCUMIN PO Take 1 tablet by mouth daily.   Yes [provider]  zolpidem (AMBIEN) 5 MG tablet Take 1 tablet (5 mg total) by mouth at bedtime as needed. Patient taking differently: Take 5 mg by mouth at bedtime. 09/06/20  Yes Hoyt Koch, MD  hydrocortisone (ANUSOL-HC) 2.5 % rectal cream Place 1 application rectally 2 (two) times daily. 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  Tobacco Use   Smoking status: Former    Packs/day: 2.00    Years: 10.00    Pack years: 20.00    Types: Cigarettes    Quit date: 02/13/1967    Years since quitting: 53.6   Smokeless tobacco: Never   Tobacco comments:    smoked 1961-1969 , up to 3 ppd, mainly 2 ppd  Vaping Use   Vaping Use: Never used  Substance and Sexual  Activity   Alcohol use: No   Drug use: No   Sexual activity: Never  Other Topics Concern   Not on file  Social History Narrative   Not on file   Social Determinants of Health   Financial Resource Strain: Not on file  Food Insecurity: Not on file  Transportation Needs: Not on file  Physical Activity: Not on file  Stress: Not on file  Social Connections: Not on file   Family History  Problem Relation Age of Onset   Dementia Mother    Hypertension Father    Heart attack Father    Allergic rhinitis Father    Cancer Maternal Aunt        breast cancer   Alzheimer's disease Maternal Aunt    Stroke Paternal Uncle    Cancer Cousin        breast cancer   Colon cancer Neg Hx    Angioedema Neg Hx    Asthma Neg Hx    Eczema Neg Hx     ROS: Currently denies lightheadedness, dizziness, Fever, chills, CP, SOB.   No personal history of DVT, PE, MI, or CVA. No loose teeth or dentures All other systems have been reviewed and were otherwise currently negative with the exception of those mentioned in the HPI and as above.  Objective: Vitals: Ht: 5'7" Wt: 130.2 lbs Temp: 98.1 BP: 89/42 Pulse: 77 O2 97% on room air.   Physical Exam: General: Alert, NAD. Trendelenberg Gait  HEENT: EOMI, Good Neck Extension  Pulm: No increased work  of breathing.  Clear B/L A/P w/o crackle or wheeze.  CV: RRR, No m/g/r appreciated GI: soft, NT, ND. BS x 4 quadrants Neuro: CN II-XII grossly intact without focal deficit.  Sensation intact distally Skin: No lesions in the area of chief complaint MSK/Surgical Site: TTP along lateral aspect of L hip. Decreased ROM left hip and left knee. Hip pain with ROM. + [place]. + SLR. + FABER/FADIR. Decreased strength.  NVI.    Imaging Review Plain radiographs demonstrate severe degenerative joint disease of the left hip.   The bone quality appears to be fair for age and reported activity level.  Preoperative templating of the joint replacement has been completed, documented, and submitted to the Operating Room personnel in order to optimize intra-operative equipment management.  Assessment: OA LEFT HIP Active Problems:   * No active hospital problems. *   Plan: Plan for Procedure(s): TOTAL HIP ARTHROPLASTY ANTERIOR APPROACH  The patient history, physical exam, clinical judgement of the provider and imaging are consistent with end stage degenerative joint disease and total joint arthroplasty is deemed medically necessary. The treatment options including medical management, injection therapy, and arthroplasty were discussed at length. The risks and benefits of Procedure(s): TOTAL HIP ARTHROPLASTY ANTERIOR APPROACH were presented and reviewed.  The risks of nonoperative treatment, versus surgical intervention including but not limited to continued pain, aseptic loosening, stiffness, dislocation/subluxation, infection, bleeding, nerve injury, blood clots, cardiopulmonary complications, morbidity, mortality, among others were discussed. The patient verbalizes understanding and wishes to proceed with the plan.  Patient is being admitted for surgery, pain control, PT, prophylactic antibiotics, VTE prophylaxis, progressive ambulation, ADL's and discharge planning. She may need to spend the night in  observation.   Dental prophylaxis discussed and recommended for 2 years postoperatively.  The patient does meet the criteria for TXA which will be used perioperatively.   ASA 81 mg BID will be used postoperatively for DVT prophylaxis in addition to SCDs, and early ambulation.  Plan for Norco 10, Mobic, Tylenol for pain.   Robaxin for muscle spasm.  Zofran for nausea and vomiting. Omeprazole for gastric protection. [place] The patient is planning to be discharged home with OPPT and into the care of her friend Jaynie Collins who can be reached at [phone] Follow up appt 11/02/20 at 4:15pm    Alisa Graff Office [phone] 10/07/2020 2:45 PM

## 2020-10-10 NOTE — Care Plan (Signed)
Ortho Bundle Case Management Note  Patient Details  Name: Hailey [name] MRN: [id number] Date of Birth: 1941-02-07  Met with patient in the office prior to surgery. She will discharge to home with family to assist. She has equipment at home. OPPT set up with [address]. Patient and MD in agreement with this plan. Choice offered.               DME Arranged:    DME Agency:     HH Arranged:    HH Agency:     Additional Comments: Please contact me with any questions of if this plan should need to change.  Ladell Heads,  [place] Specialist  [phone] 10/10/2020, 10:43 AM

## 2020-10-14 ENCOUNTER — Other Ambulatory Visit: Payer: Self-pay | Admitting: Orthopedic Surgery

## 2020-10-15 LAB — SARS CORONAVIRUS 2 (TAT 6-24 HRS): SARS Coronavirus 2: NEGATIVE

## 2020-10-18 ENCOUNTER — Encounter (HOSPITAL_COMMUNITY): Payer: Self-pay | Admitting: Orthopedic Surgery

## 2020-10-18 ENCOUNTER — Ambulatory Visit (HOSPITAL_COMMUNITY): Payer: Medicare Other

## 2020-10-18 ENCOUNTER — Ambulatory Visit (HOSPITAL_COMMUNITY): Payer: Medicare Other | Admitting: Anesthesiology

## 2020-10-18 ENCOUNTER — Encounter (HOSPITAL_COMMUNITY)
Admission: RE | Disposition: A | Discharge status: Home / Self Care | Payer: Self-pay | Source: Home / Self Care | Attending: Orthopedic Surgery

## 2020-10-18 ENCOUNTER — Other Ambulatory Visit: Payer: Self-pay

## 2020-10-18 ENCOUNTER — Observation Stay (HOSPITAL_COMMUNITY)
Admission: RE | Admit: 2020-10-18 | Discharge: 2020-10-19 | Disposition: A | Discharge status: Home / Self Care | Payer: Medicare Other | Attending: Orthopedic Surgery | Admitting: Orthopedic Surgery

## 2020-10-18 DIAGNOSIS — M1612 Unilateral primary osteoarthritis, left hip: Secondary | ICD-10-CM | POA: Diagnosis not present

## 2020-10-18 DIAGNOSIS — Z9989 Dependence on other enabling machines and devices: Secondary | ICD-10-CM | POA: Diagnosis not present

## 2020-10-18 DIAGNOSIS — M1712 Unilateral primary osteoarthritis, left knee: Secondary | ICD-10-CM | POA: Diagnosis not present

## 2020-10-18 DIAGNOSIS — Z96641 Presence of right artificial hip joint: Secondary | ICD-10-CM | POA: Diagnosis not present

## 2020-10-18 DIAGNOSIS — Z87891 Personal history of nicotine dependence: Secondary | ICD-10-CM | POA: Insufficient documentation

## 2020-10-18 DIAGNOSIS — Z79899 Other long term (current) drug therapy: Secondary | ICD-10-CM | POA: Diagnosis not present

## 2020-10-18 DIAGNOSIS — Z471 Aftercare following joint replacement surgery: Secondary | ICD-10-CM | POA: Diagnosis not present

## 2020-10-18 DIAGNOSIS — G4733 Obstructive sleep apnea (adult) (pediatric): Secondary | ICD-10-CM | POA: Diagnosis not present

## 2020-10-18 DIAGNOSIS — Z96642 Presence of left artificial hip joint: Secondary | ICD-10-CM | POA: Diagnosis present

## 2020-10-18 DIAGNOSIS — I2510 Atherosclerotic heart disease of native coronary artery without angina pectoris: Secondary | ICD-10-CM | POA: Diagnosis not present

## 2020-10-18 DIAGNOSIS — I10 Essential (primary) hypertension: Secondary | ICD-10-CM | POA: Insufficient documentation

## 2020-10-18 HISTORY — PX: TOTAL HIP ARTHROPLASTY: SHX124

## 2020-10-18 SURGERY — ARTHROPLASTY, HIP, TOTAL, ANTERIOR APPROACH
Anesthesia: General | Site: Hip | Laterality: Left

## 2020-10-18 MED ORDER — ROCURONIUM BROMIDE 10 MG/ML (PF) SYRINGE
PREFILLED_SYRINGE | INTRAVENOUS | Status: AC
  Filled 2020-10-18: qty 10

## 2020-10-18 MED ORDER — METHOCARBAMOL 500 MG IVPB - SIMPLE MED
500.0000 mg | Freq: Four times a day (QID) | INTRAVENOUS | Status: DC | PRN
  Administered 2020-10-18: 500 mg via INTRAVENOUS
  Filled 2020-10-18: qty 50

## 2020-10-18 MED ORDER — ZOLPIDEM TARTRATE 5 MG PO TABS: 5.0000 mg | ORAL_TABLET | Freq: Every evening | ORAL | Status: DC | PRN

## 2020-10-18 MED ORDER — ROCURONIUM 10MG/ML (10ML) SYRINGE FOR MEDFUSION PUMP - OPTIME
INTRAVENOUS | Status: DC | PRN
  Administered 2020-10-18: 50 mg via INTRAVENOUS

## 2020-10-18 MED ORDER — SODIUM CHLORIDE 0.9 % IV SOLN
INTRAVENOUS | Status: DC | PRN
  Administered 2020-10-18: 500 mL via INTRAVENOUS

## 2020-10-18 MED ORDER — MEPERIDINE HCL 50 MG/ML IJ SOLN: 6.2500 mg | INTRAMUSCULAR | Status: DC | PRN

## 2020-10-18 MED ORDER — HYDROMORPHONE HCL 1 MG/ML IJ SOLN
INTRAMUSCULAR | Status: AC
  Filled 2020-10-18: qty 1

## 2020-10-18 MED ORDER — DOCUSATE SODIUM 100 MG PO CAPS
100.0000 mg | ORAL_CAPSULE | Freq: Two times a day (BID) | ORAL | Status: DC
  Administered 2020-10-18 – 2020-10-19 (×3): 100 mg via ORAL
  Filled 2020-10-18 (×3): qty 1

## 2020-10-18 MED ORDER — TRAMADOL HCL 50 MG PO TABS
50.0000 mg | ORAL_TABLET | Freq: Four times a day (QID) | ORAL | Status: DC
  Administered 2020-10-18 – 2020-10-19 (×4): 50 mg via ORAL
  Filled 2020-10-18 (×4): qty 1

## 2020-10-18 MED ORDER — HYDROCODONE-ACETAMINOPHEN 7.5-325 MG PO TABS
1.0000 | ORAL_TABLET | ORAL | Status: DC | PRN
  Administered 2020-10-18 – 2020-10-19 (×3): 2 via ORAL
  Filled 2020-10-18 (×3): qty 2

## 2020-10-18 MED ORDER — DEXAMETHASONE SODIUM PHOSPHATE 10 MG/ML IJ SOLN
INTRAMUSCULAR | Status: AC
  Filled 2020-10-18: qty 1

## 2020-10-18 MED ORDER — POVIDONE-IODINE 10 % EX SWAB
2.0000 "application " | Freq: Once | CUTANEOUS | Status: AC
  Administered 2020-10-18: 2 via TOPICAL

## 2020-10-18 MED ORDER — BISACODYL 10 MG RE SUPP: 10.0000 mg | Freq: Every day | RECTAL | Status: DC | PRN

## 2020-10-18 MED ORDER — FENTANYL CITRATE PF 50 MCG/ML IJ SOSY
PREFILLED_SYRINGE | INTRAMUSCULAR | Status: AC
  Filled 2020-10-18: qty 3

## 2020-10-18 MED ORDER — POLYETHYLENE GLYCOL 3350 17 G PO PACK: 17.0000 g | PACK | Freq: Every day | ORAL | Status: DC | PRN

## 2020-10-18 MED ORDER — SUGAMMADEX SODIUM 200 MG/2ML IV SOLN
INTRAVENOUS | Status: DC | PRN
  Administered 2020-10-18: 200 mg via INTRAVENOUS

## 2020-10-18 MED ORDER — SUCCINYLCHOLINE CHLORIDE 200 MG/10ML IV SOSY
PREFILLED_SYRINGE | INTRAVENOUS | Status: AC
  Filled 2020-10-18: qty 10

## 2020-10-18 MED ORDER — ONDANSETRON HCL 4 MG PO TABS: 4.0000 mg | ORAL_TABLET | Freq: Four times a day (QID) | ORAL | Status: DC | PRN

## 2020-10-18 MED ORDER — 0.9 % SODIUM CHLORIDE (POUR BTL) OPTIME
TOPICAL | Status: DC | PRN
  Administered 2020-10-18: 1000 mL

## 2020-10-18 MED ORDER — CEFAZOLIN SODIUM-DEXTROSE 2-4 GM/100ML-% IV SOLN
2.0000 g | INTRAVENOUS | Status: AC
  Administered 2020-10-18: 2 g via INTRAVENOUS
  Filled 2020-10-18: qty 100

## 2020-10-18 MED ORDER — PANTOPRAZOLE SODIUM 40 MG PO TBEC
40.0000 mg | DELAYED_RELEASE_TABLET | Freq: Every day | ORAL | Status: DC
  Administered 2020-10-18 – 2020-10-19 (×2): 40 mg via ORAL
  Filled 2020-10-18 (×2): qty 1

## 2020-10-18 MED ORDER — ATORVASTATIN CALCIUM 40 MG PO TABS
40.0000 mg | ORAL_TABLET | Freq: Every day | ORAL | Status: DC
  Administered 2020-10-18 – 2020-10-19 (×2): 40 mg via ORAL
  Filled 2020-10-18 (×3): qty 1

## 2020-10-18 MED ORDER — ACETAMINOPHEN 500 MG PO TABS
500.0000 mg | ORAL_TABLET | Freq: Four times a day (QID) | ORAL | Status: AC
  Administered 2020-10-18 – 2020-10-19 (×4): 500 mg via ORAL
  Filled 2020-10-18 (×4): qty 1

## 2020-10-18 MED ORDER — POVIDONE-IODINE 10 % EX SWAB: 2.0000 "application " | Freq: Once | CUTANEOUS | Status: DC

## 2020-10-18 MED ORDER — FENTANYL CITRATE (PF) 100 MCG/2ML IJ SOLN
INTRAMUSCULAR | Status: AC
  Filled 2020-10-18: qty 2

## 2020-10-18 MED ORDER — LIDOCAINE 2% (20 MG/ML) 5 ML SYRINGE
INTRAMUSCULAR | Status: AC
  Filled 2020-10-18: qty 5

## 2020-10-18 MED ORDER — TRANEXAMIC ACID-NACL 1000-0.7 MG/100ML-% IV SOLN
1000.0000 mg | INTRAVENOUS | Status: AC
  Administered 2020-10-18: 1000 mg via INTRAVENOUS
  Filled 2020-10-18: qty 100

## 2020-10-18 MED ORDER — MENTHOL 3 MG MT LOZG: 1.0000 | LOZENGE | OROMUCOSAL | Status: DC | PRN

## 2020-10-18 MED ORDER — CEFAZOLIN SODIUM-DEXTROSE 1-4 GM/50ML-% IV SOLN
1.0000 g | Freq: Four times a day (QID) | INTRAVENOUS | Status: AC
  Administered 2020-10-18 (×2): 1 g via INTRAVENOUS
  Filled 2020-10-18 (×2): qty 50

## 2020-10-18 MED ORDER — PROPOFOL 10 MG/ML IV BOLUS
INTRAVENOUS | Status: AC
  Filled 2020-10-18: qty 20

## 2020-10-18 MED ORDER — PHENOL 1.4 % MT LIQD: 1.0000 | OROMUCOSAL | Status: DC | PRN

## 2020-10-18 MED ORDER — FERROUS SULFATE 325 (65 FE) MG PO TABS
325.0000 mg | ORAL_TABLET | Freq: Every day | ORAL | Status: DC
  Administered 2020-10-18 – 2020-10-19 (×2): 325 mg via ORAL
  Filled 2020-10-18 (×2): qty 1

## 2020-10-18 MED ORDER — OXYCODONE HCL 5 MG PO TABS: 5.0000 mg | ORAL_TABLET | Freq: Once | ORAL | Status: DC | PRN

## 2020-10-18 MED ORDER — TRANEXAMIC ACID 1000 MG/10ML IV SOLN: 2000.0000 mg | Freq: Once | INTRAVENOUS | Status: DC

## 2020-10-18 MED ORDER — ONDANSETRON HCL 4 MG/2ML IJ SOLN
INTRAMUSCULAR | Status: AC
  Filled 2020-10-18: qty 2

## 2020-10-18 MED ORDER — BUPIVACAINE LIPOSOME 1.3 % IJ SUSP: 10.0000 mL | Freq: Once | INTRAMUSCULAR | Status: DC

## 2020-10-18 MED ORDER — METHOCARBAMOL 500 MG PO TABS
500.0000 mg | ORAL_TABLET | Freq: Four times a day (QID) | ORAL | Status: DC | PRN
  Administered 2020-10-18 – 2020-10-19 (×2): 500 mg via ORAL
  Filled 2020-10-18 (×2): qty 1

## 2020-10-18 MED ORDER — CHLORHEXIDINE GLUCONATE 0.12 % MT SOLN: 15.0000 mL | Freq: Once | OROMUCOSAL | Status: AC

## 2020-10-18 MED ORDER — FENTANYL CITRATE PF 50 MCG/ML IJ SOSY
25.0000 ug | PREFILLED_SYRINGE | INTRAMUSCULAR | Status: DC | PRN
  Administered 2020-10-18 (×3): 50 ug via INTRAVENOUS

## 2020-10-18 MED ORDER — METOCLOPRAMIDE HCL 5 MG PO TABS: 5.0000 mg | ORAL_TABLET | Freq: Three times a day (TID) | ORAL | Status: DC | PRN

## 2020-10-18 MED ORDER — SODIUM CHLORIDE FLUSH 0.9 % IV SOLN
INTRAVENOUS | Status: DC | PRN
  Administered 2020-10-18: 20 mL

## 2020-10-18 MED ORDER — ASCORBIC ACID 500 MG PO TABS
1000.0000 mg | ORAL_TABLET | Freq: Every day | ORAL | Status: DC
  Administered 2020-10-18 – 2020-10-19 (×2): 1000 mg via ORAL
  Filled 2020-10-18 (×3): qty 2

## 2020-10-18 MED ORDER — LORAZEPAM 1 MG PO TABS
1.0000 mg | ORAL_TABLET | Freq: Every day | ORAL | Status: DC
  Administered 2020-10-19: 1 mg via ORAL
  Filled 2020-10-18: qty 1

## 2020-10-18 MED ORDER — DIPHENHYDRAMINE HCL 12.5 MG/5ML PO ELIX: 12.5000 mg | ORAL_SOLUTION | ORAL | Status: DC | PRN

## 2020-10-18 MED ORDER — ONDANSETRON HCL 4 MG/2ML IJ SOLN
4.0000 mg | Freq: Four times a day (QID) | INTRAMUSCULAR | Status: DC | PRN
  Administered 2020-10-18: 4 mg via INTRAVENOUS
  Filled 2020-10-18: qty 2

## 2020-10-18 MED ORDER — LIDOCAINE 2% (20 MG/ML) 5 ML SYRINGE
INTRAMUSCULAR | Status: AC
  Filled 2020-10-18: qty 10

## 2020-10-18 MED ORDER — ACETAMINOPHEN 160 MG/5ML PO SOLN: 325.0000 mg | ORAL | Status: DC | PRN

## 2020-10-18 MED ORDER — METHOCARBAMOL 500 MG IVPB - SIMPLE MED
INTRAVENOUS | Status: AC
  Filled 2020-10-18: qty 50

## 2020-10-18 MED ORDER — CALCIUM CARBONATE ANTACID 500 MG PO CHEW
1.0000 | CHEWABLE_TABLET | Freq: Every day | ORAL | Status: DC
  Filled 2020-10-18: qty 1

## 2020-10-18 MED ORDER — PROPOFOL 10 MG/ML IV BOLUS
INTRAVENOUS | Status: DC | PRN
  Administered 2020-10-18: 100 mg via INTRAVENOUS

## 2020-10-18 MED ORDER — ORAL CARE MOUTH RINSE
15.0000 mL | Freq: Once | OROMUCOSAL | Status: AC
  Administered 2020-10-18: 15 mL via OROMUCOSAL

## 2020-10-18 MED ORDER — ACETAMINOPHEN 325 MG PO TABS: 325.0000 mg | ORAL_TABLET | ORAL | Status: DC | PRN

## 2020-10-18 MED ORDER — OXYCODONE HCL 5 MG/5ML PO SOLN: 5.0000 mg | Freq: Once | ORAL | Status: DC | PRN

## 2020-10-18 MED ORDER — ASPIRIN 81 MG PO CHEW
81.0000 mg | CHEWABLE_TABLET | Freq: Two times a day (BID) | ORAL | Status: DC
  Administered 2020-10-18 – 2020-10-19 (×2): 81 mg via ORAL
  Filled 2020-10-18 (×2): qty 1

## 2020-10-18 MED ORDER — DEXAMETHASONE SODIUM PHOSPHATE 10 MG/ML IJ SOLN
10.0000 mg | Freq: Once | INTRAMUSCULAR | Status: AC
  Administered 2020-10-19: 10 mg via INTRAVENOUS
  Filled 2020-10-18: qty 1

## 2020-10-18 MED ORDER — BUPIVACAINE LIPOSOME 1.3 % IJ SUSP
INTRAMUSCULAR | Status: AC
  Filled 2020-10-18: qty 10

## 2020-10-18 MED ORDER — SODIUM CHLORIDE (PF) 0.9 % IJ SOLN
INTRAMUSCULAR | Status: AC
  Filled 2020-10-18: qty 20

## 2020-10-18 MED ORDER — METOCLOPRAMIDE HCL 5 MG/ML IJ SOLN: 5.0000 mg | Freq: Three times a day (TID) | INTRAMUSCULAR | Status: DC | PRN

## 2020-10-18 MED ORDER — HYDROMORPHONE HCL 1 MG/ML IJ SOLN
0.2500 mg | INTRAMUSCULAR | Status: DC | PRN
  Administered 2020-10-18 (×3): 0.5 mg via INTRAVENOUS

## 2020-10-18 MED ORDER — LIDOCAINE 2% (20 MG/ML) 5 ML SYRINGE
INTRAMUSCULAR | Status: DC | PRN
  Administered 2020-10-18: 80 mg via INTRAVENOUS

## 2020-10-18 MED ORDER — ADULT MULTIVITAMIN W/MINERALS CH
1.0000 | ORAL_TABLET | Freq: Every day | ORAL | Status: DC
  Administered 2020-10-18 – 2020-10-19 (×2): 1 via ORAL
  Filled 2020-10-18 (×2): qty 1

## 2020-10-18 MED ORDER — DEXAMETHASONE SODIUM PHOSPHATE 10 MG/ML IJ SOLN
8.0000 mg | Freq: Once | INTRAMUSCULAR | Status: AC
  Administered 2020-10-18: 10 mg via INTRAVENOUS

## 2020-10-18 MED ORDER — ACETAMINOPHEN 500 MG PO TABS
1000.0000 mg | ORAL_TABLET | Freq: Once | ORAL | Status: AC
  Administered 2020-10-18: 1000 mg via ORAL
  Filled 2020-10-18: qty 2

## 2020-10-18 MED ORDER — SUCCINYLCHOLINE CHLORIDE 200 MG/10ML IV SOSY
PREFILLED_SYRINGE | INTRAVENOUS | Status: DC | PRN
  Administered 2020-10-18: 100 mg via INTRAVENOUS

## 2020-10-18 MED ORDER — BUPIVACAINE LIPOSOME 1.3 % IJ SUSP
INTRAMUSCULAR | Status: DC | PRN
  Administered 2020-10-18: 20 mL

## 2020-10-18 MED ORDER — WATER FOR IRRIGATION, STERILE IR SOLN
Status: DC | PRN
  Administered 2020-10-18: 2000 mL

## 2020-10-18 MED ORDER — FENTANYL CITRATE (PF) 100 MCG/2ML IJ SOLN
INTRAMUSCULAR | Status: DC | PRN
  Administered 2020-10-18: 100 ug via INTRAVENOUS
  Administered 2020-10-18: 25 ug via INTRAVENOUS
  Administered 2020-10-18: 50 ug via INTRAVENOUS
  Administered 2020-10-18: 25 ug via INTRAVENOUS

## 2020-10-18 MED ORDER — HYDROCODONE-ACETAMINOPHEN 5-325 MG PO TABS: 1.0000 | ORAL_TABLET | ORAL | Status: DC | PRN

## 2020-10-18 MED ORDER — ALUM & MAG HYDROXIDE-SIMETH 200-200-20 MG/5ML PO SUSP: 30.0000 mL | ORAL | Status: DC | PRN

## 2020-10-18 MED ORDER — VITAMIN D 25 MCG (1000 UNIT) PO TABS
1000.0000 [IU] | ORAL_TABLET | Freq: Every day | ORAL | Status: DC
  Administered 2020-10-18 – 2020-10-19 (×2): 1000 [IU] via ORAL
  Filled 2020-10-18 (×2): qty 1

## 2020-10-18 MED ORDER — ESCITALOPRAM OXALATE 20 MG PO TABS
20.0000 mg | ORAL_TABLET | Freq: Every day | ORAL | Status: DC
  Administered 2020-10-18 – 2020-10-19 (×2): 20 mg via ORAL
  Filled 2020-10-18 (×2): qty 1

## 2020-10-18 MED ORDER — MORPHINE SULFATE (PF) 2 MG/ML IV SOLN: 0.5000 mg | INTRAVENOUS | Status: DC | PRN

## 2020-10-18 MED ORDER — ONDANSETRON HCL 4 MG/2ML IJ SOLN: 4.0000 mg | Freq: Once | INTRAMUSCULAR | Status: DC | PRN

## 2020-10-18 MED ORDER — TRANEXAMIC ACID-NACL 1000-0.7 MG/100ML-% IV SOLN
1000.0000 mg | Freq: Once | INTRAVENOUS | Status: AC
  Administered 2020-10-18: 1000 mg via INTRAVENOUS
  Filled 2020-10-18: qty 100

## 2020-10-18 MED ORDER — FLUTICASONE PROPIONATE 50 MCG/ACT NA SUSP
1.0000 | Freq: Two times a day (BID) | NASAL | Status: DC
  Administered 2020-10-19: 1 via NASAL
  Filled 2020-10-18: qty 16

## 2020-10-18 MED ORDER — ONDANSETRON HCL 4 MG/2ML IJ SOLN
INTRAMUSCULAR | Status: DC | PRN
  Administered 2020-10-18: 4 mg via INTRAVENOUS

## 2020-10-18 MED ORDER — ACETAMINOPHEN 325 MG PO TABS: 325.0000 mg | ORAL_TABLET | Freq: Four times a day (QID) | ORAL | Status: DC | PRN

## 2020-10-18 MED ORDER — LACTATED RINGERS IV SOLN: INTRAVENOUS | Status: DC

## 2020-10-18 SURGICAL SUPPLY — 43 items
BAG COUNTER SPONGE SURGICOUNT (BAG) IMPLANT
BAG ZIPLOCK 12X15 (MISCELLANEOUS) IMPLANT
BLADE SAG 18X100X1.27 (BLADE) ×2 IMPLANT
BLADE SURG SZ10 CARB STEEL (BLADE) IMPLANT
CHLORAPREP W/TINT 26 (MISCELLANEOUS) ×2 IMPLANT
CLSR STERI-STRIP ANTIMIC 1/2X4 (GAUZE/BANDAGES/DRESSINGS) ×2 IMPLANT
COVER PERINEAL POST (MISCELLANEOUS) ×2 IMPLANT
COVER SURGICAL LIGHT HANDLE (MISCELLANEOUS) ×2 IMPLANT
DECANTER SPIKE VIAL GLASS SM (MISCELLANEOUS) ×4 IMPLANT
DRAPE IMP U-DRAPE 54X76 (DRAPES) ×2 IMPLANT
DRAPE STERI IOBAN 125X83 (DRAPES) ×2 IMPLANT
DRAPE U-SHAPE 47X51 STRL (DRAPES) ×4 IMPLANT
DRSG MEPILEX BORDER 4X8 (GAUZE/BANDAGES/DRESSINGS) ×2 IMPLANT
ELECT REM PT RETURN 15FT ADLT (MISCELLANEOUS) ×2 IMPLANT
GLOVE SRG 8 PF TXTR STRL LF DI (GLOVE) ×1 IMPLANT
GLOVE SURG ENC MOIS LTX SZ7.5 (GLOVE) ×2 IMPLANT
GLOVE SURG POLYISO LF SZ7.5 (GLOVE) ×2 IMPLANT
GLOVE SURG UNDER POLY LF SZ7.5 (GLOVE) ×2 IMPLANT
GLOVE SURG UNDER POLY LF SZ8 (GLOVE) ×2
GOWN STRL REUS W/TWL LRG LVL3 (GOWN DISPOSABLE) ×2 IMPLANT
GOWN STRL REUS W/TWL XL LVL3 (GOWN DISPOSABLE) ×2 IMPLANT
HEAD BIOLOX HIP 36/-5 (Joint) ×1 IMPLANT
HIP BIOLOX HD 36/-5 (Joint) ×2 IMPLANT
HOLDER FOLEY CATH W/STRAP (MISCELLANEOUS) IMPLANT
INSERT 0 DEGREE 36 (Miscellaneous) ×2 IMPLANT
KIT TURNOVER KIT A (KITS) ×2 IMPLANT
MANIFOLD NEPTUNE II (INSTRUMENTS) ×2 IMPLANT
NS IRRIG 1000ML POUR BTL (IV SOLUTION) ×2 IMPLANT
PACK ANTERIOR HIP CUSTOM (KITS) ×2 IMPLANT
PROTECTOR NERVE ULNAR (MISCELLANEOUS) ×2 IMPLANT
SCREW HEX LP 6.5X20 (Screw) ×2 IMPLANT
SHELL TRIDENT II CLUST 50 (Shell) ×2 IMPLANT
STEM HIP 4 127DEG (Stem) ×2 IMPLANT
SUT MNCRL AB 3-0 PS2 18 (SUTURE) ×2 IMPLANT
SUT STRATAFIX 0 PDS 27 VIOLET (SUTURE) ×2
SUT VIC AB 0 CT1 36 (SUTURE) ×2 IMPLANT
SUT VIC AB 1 CT1 36 (SUTURE) ×2 IMPLANT
SUT VIC AB 2-0 CT1 27 (SUTURE) ×4
SUT VIC AB 2-0 CT1 TAPERPNT 27 (SUTURE) ×2 IMPLANT
SUTURE STRATFX 0 PDS 27 VIOLET (SUTURE) ×1 IMPLANT
TRAY FOLEY MTR SLVR 16FR STAT (SET/KITS/TRAYS/PACK) IMPLANT
TUBE SUCTION HIGH CAP CLEAR NV (SUCTIONS) ×2 IMPLANT
WATER STERILE IRR 1000ML POUR (IV SOLUTION) ×4 IMPLANT

## 2020-10-18 NOTE — Transfer of Care (Signed)
Immediate Anesthesia Transfer of Care Note  Patient: Hailey [name]  Procedure(s) Performed: TOTAL HIP ARTHROPLASTY ANTERIOR APPROACH (Left: Hip)  Patient Location: PACU  Anesthesia Type:General  Level of Consciousness: awake, alert  and oriented  Airway & Oxygen Therapy: Patient Spontanous Breathing and Patient connected to face mask oxygen  Post-op Assessment: Report given to RN and Post -op Vital signs reviewed and stable  Post vital signs: Reviewed and stable  Last Vitals:  Vitals Value Taken Time  BP 165/89 10/18/20 1156  Temp    Pulse 85 10/18/20 1158  Resp 12 10/18/20 1158  SpO2 100 % 10/18/20 1158  Vitals shown include unvalidated device data.  Last Pain:  Vitals:   10/18/20 0745  TempSrc: Oral         Complications: No notable events documented.

## 2020-10-18 NOTE — Progress Notes (Signed)
Handbook given to patient and plan of care initiated.

## 2020-10-18 NOTE — Op Note (Signed)
10/18/2020  11:17 AM  PATIENT:  Hailey [name]   MRN: [id number]  PRE-OPERATIVE DIAGNOSIS:  OA LEFT HIP  POST-OPERATIVE DIAGNOSIS:  OA LEFT HIP  PROCEDURE:  Procedure(s): TOTAL HIP ARTHROPLASTY ANTERIOR APPROACH  PREOPERATIVE INDICATIONS:    OLANDA BOUGHNER is an 80 y.o. female who has a diagnosis of <principal problem not specified> and elected for surgical management after failing conservative treatment.  The risks benefits and alternatives were discussed with the patient including but not limited to the risks of nonoperative treatment, versus surgical intervention including infection, bleeding, nerve injury, periprosthetic fracture, the need for revision surgery, dislocation, leg length discrepancy, blood clots, cardiopulmonary complications, morbidity, mortality, among others, and they were willing to proceed.     OPERATIVE REPORT     SURGEON:   Renette Butters, MD    ASSISTANT:  Aggie Moats, PA-C, he was present and scrubbed throughout the case, critical for completion in a timely fashion, and for retraction, instrumentation, and closure.     ANESTHESIA:  General    COMPLICATIONS:  None.     COMPONENTS:  Stryker acolade fit femur size 4 with a 36 mm -5 head ball and an acetabular shell size 50 with a  polyethylene liner    PROCEDURE IN DETAIL:   The patient was met in the holding area and  identified.  The appropriate hip was identified and marked at the operative site.  The patient was then transported to the OR  and  placed under anesthesia per that record.  At that point, the patient was  placed in the supine position and  secured to the operating room table and all bony prominences padded. He received pre-operative antibiotics    The operative lower extremity was prepped from the iliac crest to the distal leg.  Sterile draping was performed.  Time out was performed prior to incision.      Skin incision was made just 2 cm lateral to the ASIS  extending in line with the  tensor fascia lata. Electrocautery was used to control all bleeders. I dissected down sharply to the fascia of the tensor fascia lata was confirmed that the muscle fibers beneath were running posteriorly. I then incised the fascia over the superficial tensor fascia lata in line with the incision. The fascia was elevated off the anterior aspect of the muscle the muscle was retracted posteriorly and protected throughout the case. I then used electrocautery to incise the tensor fascia lata fascia control and all bleeders. Immediately visible was the fat over top of the anterior neck and capsule.  I removed the anterior fat from the capsule and elevated the rectus muscle off of the anterior capsule. I then removed a large time of capsule. The retractors were then placed over the anterior acetabulum as well as around the superior and inferior neck.  I then made a femoral neck cut. Then used the power corkscrew to remove the femoral head from the acetabulum and thoroughly irrigated the acetabulum. I sized the femoral head.    I then exposed the deep acetabulum, cleared out any tissue including the ligamentum teres.   After adequate visualization, I excised the labrum, and then sequentially reamed.  I then impacted the acetabular implant into place using fluoroscopy for guidance.  Appropriate version and inclination was confirmed clinically matching their bony anatomy, and with fluoroscopy.  I placed a 20 mm screw in the posterior/superio position with an excellent bite.    I then placed the polyethylene liner in  place  I then adducted the leg and released the external rotators from the posterior femur allowing it to be easily delivered up lateral and anterior to the acetabulum for preparation of the femoral canal.    I then prepared the proximal femur using the cookie-cutter and then sequentially reamed and broached.  A trial broach, neck, and head was utilized, and I reduced the hip and used floroscopy to  assess the neck length and femoral implant.  I then impacted the femoral prosthesis into place into the appropriate version. The hip was then reduced and fluoroscopy confirmed appropriate position. Leg lengths were restored.  I then irrigated the hip copiously again with, and repaired the fascia with Vicryl, followed by monocryl for the subcutaneous tissue, Monocryl for the skin, Steri-Strips and sterile gauze. The patient was then awakened and returned to PACU in stable and satisfactory condition. There were no complications.  POST OPERATIVE PLAN: WBAT, DVT px: SCD's/TED, ambulation and chemical dvt px  Timothy Murphy, MD Orthopedic Surgeon [phone]     

## 2020-10-18 NOTE — Evaluation (Signed)
Physical Therapy Evaluation Patient Details Name: Hailey [name] MRN: [id number] DOB: 11/12/1940 Today's Date: 10/18/2020   History of Present Illness  80 yo female S/P total Hip arthroplasty through Anterior Approach on 10/18/20. PMH:SAH, depression,Carotid artery dis., R tibia fx, sacral fusion,  neck and back surgeries.  Clinical Impression   The patient reports" I move slow."  Patient required mod assistance of 1 for bed mobility, stand and pivot using Rw to and from [facility]. Patient's caregiver present to assist also.  Patient plans for 24/7 caregiver,Pat.  Pt admitted with above diagnosis.  Pt currently with functional limitations due to the deficits listed below (see PT Problem List). Pt will benefit from skilled PT to increase their independence and safety with mobility to allow discharge to the venue listed below.        Follow Up Recommendations Follow surgeon's recommendation for DC plan and follow-up therapies    Equipment Recommendations  None recommended by PT    Recommendations for Other Services       Precautions / Restrictions Precautions Precautions: Fall Restrictions Weight Bearing Restrictions: No LLE Weight Bearing: Weight bearing as tolerated      Mobility  Bed Mobility Overal bed mobility: Needs Assistance Bed Mobility: Supine to Sit;Sit to Supine     Supine to sit: Mod assist Sit to supine: Max assist   General bed mobility comments: assit with legs, using bed pad to sit up. Both legs placed onto bed.    Transfers Overall transfer level: Needs assistance Equipment used: Rolling walker (2 wheeled) Transfers: Sit to/from [facility] Sit to Stand: Mod assist;From elevated surface Stand pivot transfers: Mod assist       General transfer comment: assist to rise from bed  min assist, mod fromlower toilet. Able to take small shuffle steps to BASC then back to bed.  Ambulation/Gait                Stairs             Wheelchair Mobility    Modified Rankin (Stroke Patients Only)       Balance Overall balance assessment: History of Falls;Needs assistance Sitting-balance support: Feet supported;Bilateral upper extremity supported Sitting balance-Leahy Scale: Fair     Standing balance support: During functional activity;Bilateral upper extremity supported Standing balance-Leahy Scale: Poor Standing balance comment: requires Rw and support                             Pertinent Vitals/Pain Pain Assessment: 0-10 Pain Score: 5  Pain Location: left hip Pain Descriptors / Indicators: Aching Pain Intervention(s): Monitored during session;Premedicated before session;Ice applied;Repositioned    Home Living Family/patient expects to be discharged to:: Private residence Living Arrangements: Non-relatives/Friends;Children Available Help at Discharge: Family;Friend(s) Type of Home: House Home Access: Level entry Entrance Stairs-Rails: Right;Left Entrance Stairs-Number of Steps: 2 Home Layout: One level Home Equipment: Walker - 2 wheels;Bedside commode;Cane - single point Additional Comments: Pat a caregiver staying 24/7    Prior Function Level of Independence: Needs assistance   Gait / Transfers Assistance Needed: uses a cane, has balance issues  ADL's / Homemaking Assistance Needed: requires assist with meals, States dresses self        Hand Dominance        Extremity/Trunk Assessment   Upper Extremity Assessment Upper Extremity Assessment: Overall WFL for tasks assessed (tremors noted)    Lower Extremity Assessment Lower Extremity Assessment: LLE deficits/detail LLE Deficits / Details:  patient in supne with hip/knee flexed, abe to [facility]    Cervical / Trunk Assessment Cervical / Trunk Assessment: Kyphotic;Other exceptions Cervical / Trunk Exceptions: lacks neck rotation  Communication   Communication: No difficulties  Cognition Arousal/Alertness:  Awake/alert Behavior During Therapy: WFL for tasks assessed/performed Overall Cognitive Status: Within Functional Limits for tasks assessed                                 General Comments: "I'm slow"      General Comments      Exercises     Assessment/Plan    PT Assessment Patient needs continued PT services  PT Problem List Decreased strength;Decreased mobility;Decreased safety awareness;Decreased range of motion;Decreased activity tolerance;Decreased knowledge of precautions;Decreased balance;Decreased knowledge of use of DME;Pain       PT Treatment Interventions DME instruction;Therapeutic activities;Gait training;Therapeutic exercise;Patient/family education;Stair training;Balance training;Functional mobility training    PT Goals (Current goals can be found in the Care Plan section)  Acute Rehab PT Goals Patient Stated Goal: to get my balance back PT Goal Formulation: With patient/family Time For Goal Achievement: 10/25/20 Potential to Achieve Goals: Good    Frequency 7X/week   Barriers to discharge        Co-evaluation               AM-PAC PT "6 Clicks" Mobility  Outcome Measure Help needed turning from your back to your side while in a flat bed without using bedrails?: A Lot Help needed moving from lying on your back to sitting on the side of a flat bed without using bedrails?: A Lot Help needed moving to and from a bed to a chair (including a wheelchair)?: A Lot Help needed standing up from a chair using your arms (e.g., wheelchair or bedside chair)?: A Lot Help needed to walk in hospital room?: A Lot Help needed climbing 3-5 steps with a railing? : A Lot 6 Click Score: 12    End of Session   Activity Tolerance: Patient limited by fatigue Patient left: in bed;with call bell/phone within reach;with bed alarm set;with family/visitor present Nurse Communication: Mobility status PT Visit Diagnosis: Unsteadiness on feet (R26.81);Repeated  falls (R29.6);Muscle weakness (generalized) (M62.81);Dizziness and giddiness (R42);Other symptoms and signs involving the nervous system (R29.898)    Time: 1535-1610 PT Time Calculation (min) (ACUTE ONLY): 35 min   Charges:   PT Evaluation $PT Eval Low Complexity: 1 Low PT Treatments $Therapeutic Activity: 8-22 mins        Tresa Endo PT Acute Rehabilitation Services Pager [phone] Office [phone]  Claretha Cooper 10/18/2020, 4:18 PM

## 2020-10-18 NOTE — Discharge Instructions (Signed)
You may bear weight as tolerated. Keep your dressing on and dry until follow up. Take medicine to prevent blood clots as directed. Take pain medicine as needed with the goal of transitioning to over the counter medicines.    INSTRUCTIONS AFTER JOINT REPLACEMENT   Remove items at home which could result in a fall. This includes throw rugs or furniture in walking pathways ICE to the affected joint every three hours while awake for 30 minutes at a time, for at least the first 3-5 days, and then as needed for pain and swelling.  Continue to use ice for pain and swelling. You may notice swelling that will progress down to the foot and ankle.  This is normal after surgery.  Elevate your leg when you are not up walking on it.   Continue to use the breathing machine you got in the hospital (incentive spirometer) which will help keep your temperature down.  It is common for your temperature to cycle up and down following surgery, especially at night when you are not up moving around and exerting yourself.  The breathing machine keeps your lungs expanded and your temperature down.   DIET:  As you were doing prior to hospitalization, we recommend a well-balanced diet.  DRESSING / WOUND CARE / SHOWERING  You may shower 3 days after surgery, but keep the wounds dry during showering.  You may use an occlusive plastic wrap (Press'n Seal for example) with blue painter's tape at edges, NO SOAKING/SUBMERGING IN THE BATHTUB.  If the bandage gets wet, call the office.   ACTIVITY  Increase activity slowly as tolerated, but follow the weight bearing instructions below.   No driving for 6 weeks or until further direction given by your physician.  You cannot drive while taking narcotics.  No lifting or carrying greater than 10 lbs. until further directed by your surgeon. Avoid periods of inactivity such as sitting longer than an hour when not asleep. This helps prevent blood clots.  You may return to work once you  are authorized by your doctor.    WEIGHT BEARING   Weight bearing as tolerated with assist device (walker, cane, etc) as directed, use it as long as suggested by your surgeon or therapist, typically at least 4-6 weeks.   EXERCISES  Results after joint replacement surgery are often greatly improved when you follow the exercise, range of motion and muscle strengthening exercises prescribed by your doctor. Safety measures are also important to protect the joint from further injury. Any time any of these exercises cause you to have increased pain or swelling, decrease what you are doing until you are comfortable again and then slowly increase them. If you have problems or questions, call your caregiver or physical therapist for advice.   Rehabilitation is important following a joint replacement. After just a few days of immobilization, the muscles of the leg can become weakened and shrink (atrophy).  These exercises are designed to build up the tone and strength of the thigh and leg muscles and to improve motion. Often times heat used for twenty to thirty minutes before working out will loosen up your tissues and help with improving the range of motion but do not use heat for the first two weeks following surgery (sometimes heat can increase post-operative swelling).   These exercises can be done on a training (exercise) mat, on the floor, on a table or on a bed. Use whatever works the best and is most comfortable for you.  Use music or television while you are exercising so that the exercises are a pleasant break in your day. This will make your life better with the exercises acting as a break in your routine that you can look forward to.   Perform all exercises about fifteen times, three times per day or as directed.  You should exercise both the operative leg and the other leg as well.  Exercises include:   Quad Sets - Tighten up the muscle on the front of the thigh (Quad) and hold for 5-10  seconds.   Straight Leg Raises - With your knee straight (if you were given a brace, keep it on), lift the leg to 60 degrees, hold for 3 seconds, and slowly lower the leg.  Perform this exercise against resistance later as your leg gets stronger.  Leg Slides: Lying on your back, slowly slide your foot toward your buttocks, bending your knee up off the floor (only go as far as is comfortable). Then slowly slide your foot back down until your leg is flat on the floor again.  Angel Wings: Lying on your back spread your legs to the side as far apart as you can without causing discomfort.  Hamstring Strength:  Lying on your back, push your heel against the floor with your leg straight by tightening up the muscles of your buttocks.  Repeat, but this time bend your knee to a comfortable angle, and push your heel against the floor.  You may put a pillow under the heel to make it more comfortable if necessary.   A rehabilitation program following joint replacement surgery can speed recovery and prevent re-injury in the future due to weakened muscles. Contact your doctor or a physical therapist for more information on knee rehabilitation.    CONSTIPATION  Constipation is defined medically as fewer than three stools per week and severe constipation as less than one stool per week.  Even if you have a regular bowel pattern at home, your normal regimen is likely to be disrupted due to multiple reasons following surgery.  Combination of anesthesia, postoperative narcotics, change in appetite and fluid intake all can affect your bowels.   YOU MUST use at least one of the following options; they are listed in order of increasing strength to get the job done.  They are all available over the counter, and you may need to use some, POSSIBLY even all of these options:    Drink plenty of fluids (prune juice may be helpful) and high fiber foods Colace 100 mg by mouth twice a day  Senokot for constipation as directed and  as needed Dulcolax (bisacodyl), take with full glass of water  Miralax (polyethylene glycol) once or twice a day as needed.  If you have tried all these things and are unable to have a bowel movement in the first 3-4 days after surgery call either your surgeon or your primary doctor.    If you experience loose stools or diarrhea, hold the medications until you stool forms back up.  If your symptoms do not get better within 1 week or if they get worse, check with your doctor.  If you experience "the worst abdominal pain ever" or develop nausea or vomiting, please contact the office immediately for further recommendations for treatment.   ITCHING:  If you experience itching with your medications, try taking only a single pain pill, or even half a pain pill at a time.  You can also use Benadryl over the counter  for itching or also to help with sleep.   TED HOSE STOCKINGS:  Use stockings on both legs until for at least 2 weeks or as directed by physician office. They may be removed at night for sleeping.  MEDICATIONS:  See your medication summary on the "After Visit Summary" that nursing will review with you.  You may have some home medications which will be placed on hold until you complete the course of blood thinner medication.  It is important for you to complete the blood thinner medication as prescribed.  Take medicines as prescribed.   You have several different medicines that work in different ways. - Tylenol is for mild to moderate pain. Try to take this medicine before turning to your narcotic medicines.  - Meloxicam is to reduce pain / inflammation - Robaxin is for muscle spasms. This medicine can make you drowsy. - Norco/Vicodin is a narcotic pain medicine.  Take this for severe pain. This medicine can be dehydrating / constipating. - Zofran is for nausea and vomiting. - Omeprazole is for gastric protection while taking pain medicine.  - Aspirin is to prevent blood clots after surgery.  YOU MUST TAKE THIS MEDICINE!!!  PRECAUTIONS:  If you experience chest pain or shortness of breath - call 911 immediately for transfer to the hospital emergency department.   If you develop a fever greater that 101 F, purulent drainage from wound, increased redness or drainage from wound, foul odor from the wound/dressing, or calf pain - CONTACT YOUR SURGEON.                                                   FOLLOW-UP APPOINTMENTS:  If you do not already have a post-op appointment, please call the office [phone] for an appointment to be seen by Dr. Percell Miller in 2 weeks.   OTHER INSTRUCTIONS:   MAKE SURE YOU:  Understand these instructions.  Get help right away if you are not doing well or get worse.    Thank you for letting us be a part of your medical care team.  It is a privilege we respect greatly.  We hope these instructions will help you stay on track for a fast and full recovery!      POST-OPERATIVE OPIOID TAPER INSTRUCTIONS: It is important to wean off of your opioid medication as soon as possible. If you do not need pain medication after your surgery it is ok to stop day one. Opioids include: Codeine, Hydrocodone(Norco, Vicodin), Oxycodone(Percocet, oxycontin) and hydromorphone amongst others.  Long term and even short term use of opiods can cause: Increased pain response Dependence Constipation Depression Respiratory depression And more.  Withdrawal symptoms can include Flu like symptoms Nausea, vomiting And more Techniques to manage these symptoms Hydrate well Eat regular healthy meals Stay active Use relaxation techniques(deep breathing, meditating, yoga) Do Not substitute Alcohol to help with tapering If you have been on opioids for less than two weeks and do not have pain than it is ok to stop all together.  Plan to wean off of opioids This plan should start within one week post op of your joint replacement. Maintain the same interval or time between taking  each dose and first decrease the dose.  Cut the total daily intake of opioids by one tablet each day Next start to increase the time between doses. The  last dose that should be eliminated is the evening dose.

## 2020-10-18 NOTE — Interval H&P Note (Signed)
History and Physical Interval Note:  10/18/2020 8:53 AM  [name]  has presented today for surgery, with the diagnosis of OA LEFT HIP.  The various methods of treatment have been discussed with the patient and family. After consideration of risks, benefits and other options for treatment, the patient has consented to  Procedure(s): TOTAL HIP ARTHROPLASTY ANTERIOR APPROACH (Left) as a surgical intervention.  The patient's history has been reviewed, patient examined, no change in status, stable for surgery.  I have reviewed the patient's chart and labs.  Questions were answered to the patient's satisfaction.     Hailey [name]

## 2020-10-18 NOTE — Anesthesia Preprocedure Evaluation (Addendum)
Anesthesia Evaluation  Patient identified by MRN, date of birth, ID band Patient awake    Reviewed: Allergy & Precautions, NPO status , Patient's Chart, lab work & pertinent test results  Airway Mallampati: I  TM Distance: >3 FB Neck ROM: Limited    Dental no notable dental hx. (+) Teeth Intact, Dental Advisory Given   Pulmonary sleep apnea and Continuous Positive Airway Pressure Ventilation , former smoker,    Pulmonary exam normal breath sounds clear to auscultation       Cardiovascular hypertension, Pt. on medications Normal cardiovascular exam Rhythm:Regular Rate:Normal     Neuro/Psych PSYCHIATRIC DISORDERS Anxiety Depression    GI/Hepatic Neg liver ROS, GERD  Medicated,  Endo/Other  negative endocrine ROS  Renal/GU negative Renal ROS  negative genitourinary   Musculoskeletal  (+) Arthritis , Osteoarthritis,    Abdominal   Peds negative pediatric ROS (+)  Hematology negative hematology ROS (+)   Anesthesia Other Findings   Reproductive/Obstetrics negative OB ROS                           Anesthesia Physical  Anesthesia Plan  ASA: 3  Anesthesia Plan: General   Post-op Pain Management:    Induction: Intravenous  PONV Risk Score and Plan: 3 and Ondansetron, Dexamethasone and Treatment may vary due to age or medical condition  Airway Management Planned: Oral ETT and LMA  Additional Equipment: None  Intra-op Plan:   Post-operative Plan: Extubation in OR  Informed Consent: I have reviewed the patients History and Physical, chart, labs and discussed the procedure including the risks, benefits and alternatives for the proposed anesthesia with the patient or authorized representative who has indicated his/her understanding and acceptance.     Dental advisory given  Plan Discussed with: CRNA and Anesthesiologist  Anesthesia Plan Comments:        Anesthesia Quick  Evaluation

## 2020-10-18 NOTE — Anesthesia Procedure Notes (Signed)
Procedure Name: Intubation Date/Time: 10/18/2020 10:08 AM Performed by: [place] Maday Guarino C, CRNA Pre-anesthesia Checklist: Patient identified, Emergency Drugs available, Suction available and Patient being monitored Patient Re-evaluated:Patient Re-evaluated prior to induction Oxygen Delivery Method: Circle system utilized Preoxygenation: Pre-oxygenation with 100% oxygen Induction Type: IV induction Ventilation: Mask ventilation without difficulty Grade View: Grade I Tube type: Oral Number of attempts: 1 Airway Equipment and Method: Oral airway, Video-laryngoscopy and Rigid stylet Placement Confirmation: ETT inserted through vocal cords under direct vision, positive ETCO2 and breath sounds checked- equal and bilateral Secured at: 22 cm Tube secured with: Tape Dental Injury: Teeth and Oropharynx as per pre-operative assessment  Difficulty Due To: Difficulty was anticipated and Difficult Airway- due to reduced neck mobility

## 2020-10-19 DIAGNOSIS — Z79899 Other long term (current) drug therapy: Secondary | ICD-10-CM | POA: Diagnosis not present

## 2020-10-19 DIAGNOSIS — I10 Essential (primary) hypertension: Secondary | ICD-10-CM | POA: Diagnosis not present

## 2020-10-19 DIAGNOSIS — M1612 Unilateral primary osteoarthritis, left hip: Secondary | ICD-10-CM | POA: Diagnosis not present

## 2020-10-19 DIAGNOSIS — Z96641 Presence of right artificial hip joint: Secondary | ICD-10-CM | POA: Diagnosis not present

## 2020-10-19 DIAGNOSIS — M1712 Unilateral primary osteoarthritis, left knee: Secondary | ICD-10-CM | POA: Diagnosis not present

## 2020-10-19 DIAGNOSIS — Z87891 Personal history of nicotine dependence: Secondary | ICD-10-CM | POA: Diagnosis not present

## 2020-10-19 MED ORDER — ONDANSETRON 4 MG PO TBDP: 4.0000 mg | ORAL_TABLET | Freq: Two times a day (BID) | ORAL | 0 refills | Status: AC | PRN

## 2020-10-19 MED ORDER — ASPIRIN EC 81 MG PO TBEC: 81.0000 mg | DELAYED_RELEASE_TABLET | Freq: Two times a day (BID) | ORAL | 0 refills | Status: AC

## 2020-10-19 MED ORDER — SODIUM CHLORIDE 0.9 % IV BOLUS
1000.0000 mL | Freq: Once | INTRAVENOUS | Status: AC
  Administered 2020-10-19: 1000 mL via INTRAVENOUS

## 2020-10-19 MED ORDER — HYDROCODONE-ACETAMINOPHEN 10-325 MG PO TABS: 1.0000 | ORAL_TABLET | Freq: Four times a day (QID) | ORAL | 0 refills | Status: DC | PRN

## 2020-10-19 MED ORDER — METHOCARBAMOL 500 MG PO TABS: 500.0000 mg | ORAL_TABLET | Freq: Three times a day (TID) | ORAL | 0 refills | Status: AC | PRN

## 2020-10-19 MED ORDER — ACETAMINOPHEN 500 MG PO TABS: 1000.0000 mg | ORAL_TABLET | Freq: Three times a day (TID) | ORAL | 0 refills | Status: AC | PRN

## 2020-10-19 MED ORDER — MELOXICAM 15 MG PO TABS: 15.0000 mg | ORAL_TABLET | Freq: Every day | ORAL | 0 refills | Status: AC | PRN

## 2020-10-19 MED ORDER — OMEPRAZOLE MAGNESIUM 20 MG PO TBEC: 20.0000 mg | DELAYED_RELEASE_TABLET | Freq: Every day | ORAL | 0 refills | Status: DC

## 2020-10-19 NOTE — Discharge Summary (Signed)
Physician Discharge Summary  Patient ID: Hailey [name] MRN: [id number] DOB/AGE: 80/08/1940 80 y.o.  Admit date: 10/18/2020 Discharge date: 10/19/2020  Admission Diagnoses: left hip OA  Discharge Diagnoses:  Active Problems:   S/P total left hip arthroplasty   Discharged Condition: fair  Hospital Course: Patient underwent a Left THA by Dr. Percell Miller on [id number] without complications. She spent the night in observation for pain control and mobilization. She had some issues this morning with orthostatic hypotension but was much better after a fluid bolus was given. She had passed her PT eval and is ready for discharge home with OPPT already set up.   Consults: None  Significant Diagnostic Studies: none  Treatments: IV hydration, antibiotics: Ancef, analgesia: acetaminophen, Vicodin, and Dilaudid, anticoagulation: ASA, and surgery: left THA  Discharge Exam: Blood pressure (!) 103/58, pulse 74, temperature 98.4 F (36.9 C), temperature source Oral, resp. rate 17, height '5\' 6"'$  (1.676 m), weight 58.5 kg, SpO2 94 %. General appearance: alert, cooperative, and no distress Head: Normocephalic, without obvious abnormality, atraumatic Eyes: conjunctivae/corneas clear. PERRL, EOM's intact. Fundi benign. Resp: clear to auscultation bilaterally Cardio: regular rate and rhythm, S1, S2 normal, no murmur, click, rub or gallop GI: soft, non-tender; bowel sounds normal; no masses,  no organomegaly Extremities: extremities normal, atraumatic, no cyanosis or edema Pulses:  L brachial 2+ R brachial 2+  L radial 2+ R radial 2+  L inguinal 2+ R inguinal 2+  L popliteal 2+ R popliteal 2+  L posterior tibial 2+ R posterior tibial 2+  L dorsalis pedis 2+ R dorsalis pedis 2+   Neurologic: Alert and oriented X 3, normal strength and tone. Normal symmetric reflexes. Normal coordination and gait Incision/Wound: c/d/i  Disposition: Discharge disposition: 01-Home or Self Care       Discharge Instructions      Call MD / Call 911   Complete by: As directed    If you experience chest pain or shortness of breath, CALL 911 and be transported to the hospital emergency room.  If you develope a fever above 101 F, pus (white drainage) or increased drainage or redness at the wound, or calf pain, call your surgeon's office.   Diet - low sodium heart healthy   Complete by: As directed    Discharge instructions   Complete by: As directed    You may bear weight as tolerated. Keep your dressing on and dry until follow up. Take medicine to prevent blood clots as directed. Take pain medicine as needed with the goal of transitioning to over the counter medicines.    INSTRUCTIONS AFTER JOINT REPLACEMENT   Remove items at home which could result in a fall. This includes throw rugs or furniture in walking pathways ICE to the affected joint every three hours while awake for 30 minutes at a time, for at least the first 3-5 days, and then as needed for pain and swelling.  Continue to use ice for pain and swelling. You may notice swelling that will progress down to the foot and ankle.  This is normal after surgery.  Elevate your leg when you are not up walking on it.   Continue to use the breathing machine you got in the hospital (incentive spirometer) which will help keep your temperature down.  It is common for your temperature to cycle up and down following surgery, especially at night when you are not up moving around and exerting yourself.  The breathing machine keeps your lungs expanded and your temperature down.  DIET:  As you were doing prior to hospitalization, we recommend a well-balanced diet.  DRESSING / WOUND CARE / SHOWERING  You may shower 3 days after surgery, but keep the wounds dry during showering.  You may use an occlusive plastic wrap (Press'n Seal for example) with blue painter's tape at edges, NO SOAKING/SUBMERGING IN THE BATHTUB.  If the bandage gets wet, call the office.    ACTIVITY  Increase activity slowly as tolerated, but follow the weight bearing instructions below.   No driving for 6 weeks or until further direction given by your physician.  You cannot drive while taking narcotics.  No lifting or carrying greater than 10 lbs. until further directed by your surgeon. Avoid periods of inactivity such as sitting longer than an hour when not asleep. This helps prevent blood clots.  You may return to work once you are authorized by your doctor.    WEIGHT BEARING   Weight bearing as tolerated with assist device (walker, cane, etc) as directed, use it as long as suggested by your surgeon or therapist, typically at least 4-6 weeks.   EXERCISES  Results after joint replacement surgery are often greatly improved when you follow the exercise, range of motion and muscle strengthening exercises prescribed by your doctor. Safety measures are also important to protect the joint from further injury. Any time any of these exercises cause you to have increased pain or swelling, decrease what you are doing until you are comfortable again and then slowly increase them. If you have problems or questions, call your caregiver or physical therapist for advice.   Rehabilitation is important following a joint replacement. After just a few days of immobilization, the muscles of the leg can become weakened and shrink (atrophy).  These exercises are designed to build up the tone and strength of the thigh and leg muscles and to improve motion. Often times heat used for twenty to thirty minutes before working out will loosen up your tissues and help with improving the range of motion but do not use heat for the first two weeks following surgery (sometimes heat can increase post-operative swelling).   These exercises can be done on a training (exercise) mat, on the floor, on a table or on a bed. Use whatever works the best and is most comfortable for you.    Use music or television while  you are exercising so that the exercises are a pleasant break in your day. This will make your life better with the exercises acting as a break in your routine that you can look forward to.   Perform all exercises about fifteen times, three times per day or as directed.  You should exercise both the operative leg and the other leg as well.  Exercises include:   Quad Sets - Tighten up the muscle on the front of the thigh (Quad) and hold for 5-10 seconds.   Straight Leg Raises - With your knee straight (if you were given a brace, keep it on), lift the leg to 60 degrees, hold for 3 seconds, and slowly lower the leg.  Perform this exercise against resistance later as your leg gets stronger.  Leg Slides: Lying on your back, slowly slide your foot toward your buttocks, bending your knee up off the floor (only go as far as is comfortable). Then slowly slide your foot back down until your leg is flat on the floor again.  Angel Wings: Lying on your back spread your legs to the side as  far apart as you can without causing discomfort.  Hamstring Strength:  Lying on your back, push your heel against the floor with your leg straight by tightening up the muscles of your buttocks.  Repeat, but this time bend your knee to a comfortable angle, and push your heel against the floor.  You may put a pillow under the heel to make it more comfortable if necessary.   A rehabilitation program following joint replacement surgery can speed recovery and prevent re-injury in the future due to weakened muscles. Contact your doctor or a physical therapist for more information on knee rehabilitation.    CONSTIPATION  Constipation is defined medically as fewer than three stools per week and severe constipation as less than one stool per week.  Even if you have a regular bowel pattern at home, your normal regimen is likely to be disrupted due to multiple reasons following surgery.  Combination of anesthesia, postoperative narcotics,  change in appetite and fluid intake all can affect your bowels.   YOU MUST use at least one of the following options; they are listed in order of increasing strength to get the job done.  They are all available over the counter, and you may need to use some, POSSIBLY even all of these options:    Drink plenty of fluids (prune juice may be helpful) and high fiber foods Colace 100 mg by mouth twice a day  Senokot for constipation as directed and as needed Dulcolax (bisacodyl), take with full glass of water  Miralax (polyethylene glycol) once or twice a day as needed.  If you have tried all these things and are unable to have a bowel movement in the first 3-4 days after surgery call either your surgeon or your primary doctor.    If you experience loose stools or diarrhea, hold the medications until you stool forms back up.  If your symptoms do not get better within 1 week or if they get worse, check with your doctor.  If you experience "the worst abdominal pain ever" or develop nausea or vomiting, please contact the office immediately for further recommendations for treatment.   ITCHING:  If you experience itching with your medications, try taking only a single pain pill, or even half a pain pill at a time.  You can also use Benadryl over the counter for itching or also to help with sleep.   TED HOSE STOCKINGS:  Use stockings on both legs until for at least 2 weeks or as directed by physician office. They may be removed at night for sleeping.  MEDICATIONS:  See your medication summary on the "After Visit Summary" that nursing will review with you.  You may have some home medications which will be placed on hold until you complete the course of blood thinner medication.  It is important for you to complete the blood thinner medication as prescribed.  Take medicines as prescribed.   You have several different medicines that work in different ways. - Tylenol is for mild to moderate pain. Try to take  this medicine before turning to your narcotic medicines.  - Meloxicam is to reduce pain / inflammation - Robaxin is for muscle spasms. This medicine can make you drowsy. - Vicodin/Norco is a narcotic pain medicine.  Take this for severe pain. This medicine can be dehydrating / constipating. - Zofran is for nausea and vomiting. - Omeprazole is for gastric protection while taking pain medicine.  - Aspirin is to prevent blood clots after surgery.  PRECAUTIONS:  If you experience chest pain or shortness of breath - call 911 immediately for transfer to the hospital emergency department.   If you develop a fever greater that 101 F, purulent drainage from wound, increased redness or drainage from wound, foul odor from the wound/dressing, or calf pain - CONTACT YOUR SURGEON.                                                   FOLLOW-UP APPOINTMENTS:  If you do not already have a post-op appointment, please call the office [phone] for an appointment to be seen by Dr. Percell Miller in 2 weeks.   OTHER INSTRUCTIONS:   MAKE SURE YOU:  Understand these instructions.  Get help right away if you are not doing well or get worse.    Thank you for letting us be a part of your medical care team.  It is a privilege we respect greatly.  We hope these instructions will help you stay on track for a fast and full recovery!   Post-operative opioid taper instructions:   Complete by: As directed    POST-OPERATIVE OPIOID TAPER INSTRUCTIONS: It is important to wean off of your opioid medication as soon as possible. If you do not need pain medication after your surgery it is ok to stop day one. Opioids include: Codeine, Hydrocodone(Norco, Vicodin), Oxycodone(Percocet, oxycontin) and hydromorphone amongst others.  Long term and even short term use of opiods can cause: Increased pain response Dependence Constipation Depression Respiratory depression And more.  Withdrawal symptoms can include Flu like  symptoms Nausea, vomiting And more Techniques to manage these symptoms Hydrate well Eat regular healthy meals Stay active Use relaxation techniques(deep breathing, meditating, yoga) Do Not substitute Alcohol to help with tapering If you have been on opioids for less than two weeks and do not have pain than it is ok to stop all together.  Plan to wean off of opioids This plan should start within one week post op of your joint replacement. Maintain the same interval or time between taking each dose and first decrease the dose.  Cut the total daily intake of opioids by one tablet each day Next start to increase the time between doses. The last dose that should be eliminated is the evening dose.      Weight bearing as tolerated   Complete by: As directed       Allergies as of 10/19/2020       Reactions   Resorcinol Swelling, Other (See Comments)   blisters        Medication List     STOP taking these medications    diphenhydrAMINE 25 MG tablet Commonly known as: BENADRYL   traMADol-acetaminophen 37.5-325 MG tablet Commonly known as: ULTRACET       TAKE these medications    acetaminophen 500 MG tablet Commonly known as: TYLENOL Take 2 tablets (1,000 mg total) by mouth every 8 (eight) hours as needed for mild pain or moderate pain. What changed:  when to take this reasons to take this   aspirin EC 81 MG tablet Take 1 tablet (81 mg total) by mouth 2 (two) times daily. For DVT prophylaxis for 30 days after surgery.   atorvastatin 40 MG tablet Commonly known as: LIPITOR Take 1 tablet (40 mg total) by mouth daily.   B COMPLEX PO Take 1  tablet by mouth daily.   Calcium 1200 1200-1000 MG-UNIT Chew Chew 1 tablet by mouth daily.   cholecalciferol 25 MCG (1000 UNIT) tablet Commonly known as: VITAMIN D3 Take 1,000 Units by mouth daily.   CoQ10 400 MG Caps Take 400 mg by mouth daily.   denosumab 60 MG/ML Soln injection Commonly known as: PROLIA Inject 60 mg  into the skin every 6 (six) months. Administer in upper arm, thigh, or abdomen   diclofenac sodium 1 % Gel Commonly known as: VOLTAREN Apply 2 g topically at bedtime.   escitalopram 20 MG tablet Commonly known as: LEXAPRO Take 1 tablet (20 mg total) by mouth daily.   fluticasone 50 MCG/ACT nasal spray Commonly known as: FLONASE Use 1 spray each nostril 1-2 times daily as needed What changed:  how much to take how to take this when to take this additional instructions   HYDROcodone-acetaminophen 10-325 MG tablet Commonly known as: Norco Take 1 tablet by mouth every 6 (six) hours as needed for severe pain.   hydrocortisone 2.5 % rectal cream Commonly known as: Anusol-HC Place 1 application rectally 2 (two) times daily.   IRON PO Take 25 mg by mouth daily.   LORazepam 1 MG tablet Commonly known as: ATIVAN Take 1 tablet (1 mg total) by mouth at bedtime.   meloxicam 15 MG tablet Commonly known as: MOBIC Take 1 tablet (15 mg total) by mouth daily as needed for pain (and inflammation).   methocarbamol 500 MG tablet Commonly known as: Robaxin Take 1 tablet (500 mg total) by mouth every 8 (eight) hours as needed for muscle spasms.   MULTIVITAMIN PO Take 1 tablet by mouth daily.   omeprazole 20 MG tablet Commonly known as: PriLOSEC OTC Take 1 tablet (20 mg total) by mouth daily. For gastric protection   ondansetron 4 MG disintegrating tablet Commonly known as: Zofran ODT Take 1 tablet (4 mg total) by mouth 2 (two) times daily as needed for nausea or vomiting.   OSTEO BI-FLEX TRIPLE STRENGTH PO Take 1 tablet by mouth daily.   OVER THE COUNTER MEDICATION Take 750 mg by mouth at bedtime. CBD gummies   TURMERIC CURCUMIN PO Take 1 tablet by mouth daily.   vitamin C with rose hips 1000 MG tablet Take 1,000 mg by mouth daily.   zolpidem 5 MG tablet Commonly known as: AMBIEN Take 1 tablet (5 mg total) by mouth at bedtime as needed. What changed: when to take this    ZzzQuil 25 MG Caps Generic drug: diphenhydrAMINE HCl (Sleep) Take 50 mg by mouth at bedtime.               Discharge Care Instructions  (From admission, onward)           Start     Ordered   10/19/20 0000  Weight bearing as tolerated        10/19/20 1615            Follow-up Information     Renette Butters, MD. Go on 11/02/2020.   Specialty: Orthopedic Surgery Why: Your appointment is scheduled for 4:15. Contact information: [address] [place] [address] [phone]         [employer], [place]. Go on 10/20/2020.   Why: Your outpatient physical therapy appointment is scheduled for 3:00. Contact information: [facility] [place] [address] [phone]  Signed: Britt Bottom PA-C 10/19/2020, 4:15 PM

## 2020-10-19 NOTE — Progress Notes (Addendum)
Physical Therapy Treatment Patient Details Name: Hailey [name] MRN: [id number] DOB: Mar 29, 1940 Today's Date: 10/19/2020    History of Present Illness 80 yo female S/P total Hip arthroplasty through Anterior Approach on 10/18/20. PMH:SAH, depression,Carotid artery dis., R tibia fx, sacral fusion,  neck and back surgeries.    PT Comments    Patient orthostatic with OT earlier. Has received almost 900 Bolus saline.  Orthostatics 127/65-sup 117/62 sit  121/63 post amb-no C/O dizziness.  Patient ambulated x 20' x 2 slowly with min assistance. Patient eager to go home. Fraser Din, caregiver, present. Patient does have a WC than can be used once enters home if needed.  Will return  at ~2:00 to practice 2 steps.  Pain 5/10-premedicated.  Patient reports feeling bladder not emptying and burning. RN notified.  Follow Up Recommendations  Follow surgeon's recommendation for DC plan and follow-up therapies     Equipment Recommendations  None recommended by PT    Recommendations for Other Services       Precautions / Restrictions Precautions Precautions: Fall Precaution Comments: orthostatic? Restrictions LLE Weight Bearing: Weight bearing as tolerated    Mobility  Bed Mobility   Bed Mobility: Supine to Sit;Sit to Supine     Supine to sit: Min assist Sit to supine: Mod assist   General bed mobility comments: patient able to move both legs  to bed edge, lifting with UE's. Patient gradually sat upright. PT assisted both legs together back into bed    Transfers Overall transfer level: Needs assistance Equipment used: Rolling walker (2 wheeled) Transfers: Sit to/from Stand Sit to Stand: Mod assist         General transfer comment: multi,odal cues for hand placement. required more assist to rise from low seat9Has a lift chair at home)  Ambulation/Gait Ambulation/Gait assistance: Min assist Gait Distance (Feet): 20 Feet (x2) Assistive device: Rolling walker (2 wheeled) Gait  Pattern/deviations: Step-to pattern Gait velocity: decr   General Gait Details: frequent cues to stay inside the RW, tends be out in front. Moves very slowly.   Stairs             Wheelchair Mobility    Modified Rankin (Stroke Patients Only)       Balance Overall balance assessment: Needs assistance;History of Falls Sitting-balance support: Bilateral upper extremity supported Sitting balance-Leahy Scale: Fair     Standing balance support: During functional activity Standing balance-Leahy Scale: Poor Standing balance comment: requires RW                            Cognition Arousal/Alertness: Awake/alert Behavior During Therapy: WFL for tasks assessed/performed Overall Cognitive Status: Within Functional Limits for tasks assessed                                 General Comments: "I'm slow"      Exercises      General Comments        Pertinent Vitals/Pain Pain Score: 5  Pain Location: left hip Pain Descriptors / Indicators: Discomfort;Grimacing Pain Intervention(s): Monitored during session;Premedicated before session;Repositioned    Home Living                      Prior Function            PT Goals (current goals can now be found in the care plan section) Progress towards PT goals: Progressing  toward goals    Frequency    7X/week      PT Plan Current plan remains appropriate    Co-evaluation              AM-PAC PT "6 Clicks" Mobility   Outcome Measure  Help needed turning from your back to your side while in a flat bed without using bedrails?: A Lot Help needed moving from lying on your back to sitting on the side of a flat bed without using bedrails?: A Lot Help needed moving to and from a bed to a chair (including a wheelchair)?: A Little Help needed standing up from a chair using your arms (e.g., wheelchair or bedside chair)?: A Little Help needed to walk in hospital room?: A Little Help  needed climbing 3-5 steps with a railing? : A Lot 6 Click Score: 15    End of Session Equipment Utilized During Treatment: Gait belt Activity Tolerance: Patient tolerated treatment well Patient left: in bed;with call bell/phone within reach;with family/visitor present Nurse Communication: Mobility status PT Visit Diagnosis: Unsteadiness on feet (R26.81);Repeated falls (R29.6);Muscle weakness (generalized) (M62.81);Dizziness and giddiness (R42);Other symptoms and signs involving the nervous system (R29.898)     Time: 1225-1300 PT Time Calculation (min) (ACUTE ONLY): 35 min  Charges:  $Gait Training: 23-37 mins                     Tresa Endo PT Acute Rehabilitation Services Pager [phone] Office [phone]    Claretha Cooper 10/19/2020, 1:27 PM

## 2020-10-19 NOTE — Progress Notes (Signed)
    Subjective: Patient reports pain as mild to moderate. Tolerating diet.  Urinating. No CP, SOB. Having issues earlier with orthostatic hypotension when trying to mobilize. Ordered 1L fluid bolus. PT/OT continuing to work with patient.  Objective:   VITALS:   Vitals:   10/18/20 2015 10/19/20 0134 10/19/20 0522 10/19/20 1027  BP: 119/69 108/66 112/61 (!) 112/54  Pulse: 80 66 67 75  Resp: '16 16 16 14  '$ Temp: (!) 97.5 F (36.4 C) 98.4 F (36.9 C) 98.1 F (36.7 C) 98.2 F (36.8 C)  TempSrc: Oral Oral Oral Oral  SpO2: 98% 99% 99% 98%  Weight:      Height:       CBC Latest Ref Rng & Units 10/05/2020 09/06/2020 03/28/2018  WBC 4.0 - 10.5 K/uL 4.7 6.2 5.2  Hemoglobin 12.0 - 15.0 g/dL 12.4 12.4 12.9  Hematocrit 36.0 - 46.0 % 38.2 37.3 39.1  Platelets 150 - 400 K/uL 203 201.0 222.0   BMP Latest Ref Rng & Units 10/05/2020 09/06/2020 03/28/2018  Glucose 70 - 99 mg/dL 120(H) 84 78  BUN 8 - 23 mg/dL 21 22 24(H)  Creatinine 0.44 - 1.00 mg/dL 0.67 0.69 0.78  Sodium 135 - 145 mmol/L 140 138 139  Potassium 3.5 - 5.1 mmol/L 3.9 4.2 4.8  Chloride 98 - 111 mmol/L 100 99 100  CO2 22 - 32 mmol/L 32 33(H) 31  Calcium 8.9 - 10.3 mg/dL 9.4 9.8 10.1   Intake/Output      09/06 0701 09/07 0700 09/07 0701 09/08 0700   P.O. 50    I.V. (mL/kg) 1429.5 (24.4)    IV Piggyback 150    Total Intake(mL/kg) 1629.5 (27.9)    Urine (mL/kg/hr) 300    Blood 350    Total Output 650    Net +979.5         Urine Occurrence 1 x       Physical Exam: General: NAD. Laying in bed.  Resp: No increased wob Cardio: regular rate and rhythm ABD soft Neurologically intact MSK Neurovascularly intact Sensation intact distally Intact pulses distally Dorsiflexion/Plantar flexion intact Incision: dressing C/D/I   Assessment: 1 Day Post-Op  S/P Procedure(s) (LRB): TOTAL HIP ARTHROPLASTY ANTERIOR APPROACH (Left) by Dr. Ernesta Amble. Murphy on 10/18/20  Active Problems:   S/P total left hip  arthroplasty   Plan: Feeling better now  Advance diet Up with therapy Incentive Spirometry Elevate and Apply ice  Weightbearing: WBAT LLE Insicional and dressing care: Dressings left intact until follow-up and Reinforce dressings as needed Orthopedic device(s): None Showering: Keep dressing dry VTE prophylaxis: Aspirin '81mg'$  BID  x 30 days , SCDs, ambulation Pain control: Continue current regimen Follow - up plan: 2 weeks Contact information:  Edmonia Lynch MD, Aggie Moats PA-C  Dispo: Home today if mobilizes better with PT/OT. Eager to go home    Britt Bottom, [place] Office [phone] 10/19/2020, 12:25 PM

## 2020-10-19 NOTE — Anesthesia Postprocedure Evaluation (Signed)
Anesthesia Post Note  Patient: Hailey [name]  Procedure(s) Performed: TOTAL HIP ARTHROPLASTY ANTERIOR APPROACH (Left: Hip)     Patient location during evaluation: PACU Anesthesia Type: General Level of consciousness: awake and alert Pain management: pain level controlled Vital Signs Assessment: post-procedure vital signs reviewed and stable Respiratory status: spontaneous breathing, nonlabored ventilation, respiratory function stable and patient connected to nasal cannula oxygen Cardiovascular status: blood pressure returned to baseline and stable Postop Assessment: no apparent nausea or vomiting Anesthetic complications: no   No notable events documented.  Last Vitals:  Vitals:   10/19/20 1027 10/19/20 1350  BP: (!) 112/54 (!) 103/58  Pulse: 75 74  Resp: 14 17  Temp: 36.8 C 36.9 C  SpO2: 98% 94%    Last Pain:  Vitals:   10/19/20 1350  TempSrc: Oral  PainSc:    Pain Goal: Patients Stated Pain Goal: 3 (10/19/20 0425)                 Opal Mckellips

## 2020-10-19 NOTE — Progress Notes (Signed)
Physical Therapy Treatment Patient Details Name: Hailey [name] MRN: [id number] DOB: 1940/11/09 Today's Date: 10/19/2020    History of Present Illness 80 yo female S/P total Hip arthroplasty through Anterior Approach on 10/18/20. PMH:SAH, depression,Carotid artery dis., R tibia fx, sacral fusion,  neck and back surgeries.    PT Comments    Patient  able to negotiate 2 steps with min assist. PT goals have been met. Patient eager to DC.   Follow Up Recommendations  Follow surgeon's recommendation for DC plan and follow-up therapies     Equipment Recommendations  None recommended by PT    Recommendations for Other Services       Precautions / Restrictions Precautions Precautions: Fall Precaution Comments: orthostatic? Restrictions LLE Weight Bearing: Weight bearing as tolerated    Mobility  Bed Mobility   Bed Mobility: Supine to Sit;Sit to Supine     Supine to sit: Min assist Sit to supine: Min assist   General bed mobility comments: patient able to move both legs  to bed edge, lifting with UE's. Patient gradually sat upright. Pat  assisted both legs together back into bed    Transfers Overall transfer level: Needs assistance Equipment used: Rolling walker (2 wheeled) Transfers: Sit to/from Stand Sit to Stand: Mod assist         General transfer comment: multi,odal cues for hand placement. required more assist to rise from low seat9Has a lift chair at home)  Ambulation/Gait Ambulation/Gait assistance: Min assist Gait Distance (Feet): 20 Feet (x2) Assistive device: Rolling walker (2 wheeled) Gait Pattern/deviations: Step-to pattern Gait velocity: decr   General Gait Details: frequent cues to stay inside the RW, tends be out in front. Moves very slowly.   Stairs Stairs: Yes Stairs assistance: Min assist Stair Management: Two rails;Forwards;Step to pattern Number of Stairs: 2 General stair comments: patient performed well, Pat present   Wheelchair  Mobility    Modified Rankin (Stroke Patients Only)       Balance Overall balance assessment: Needs assistance;History of Falls Sitting-balance support: Bilateral upper extremity supported Sitting balance-Leahy Scale: Fair     Standing balance support: During functional activity Standing balance-Leahy Scale: Poor Standing balance comment: requires RW                            Cognition Arousal/Alertness: Awake/alert Behavior During Therapy: WFL for tasks assessed/performed Overall Cognitive Status: Within Functional Limits for tasks assessed                                 General Comments: "I'm slow"      Exercises Total Joint Exercises Ankle Circles/Pumps: AROM;10 reps;Supine Short Arc Quad: AROM;Left;10 reps Heel Slides: AROM;Left;10 reps Hip ABduction/ADduction: AAROM;Left;10 reps;Supine    General Comments        Pertinent Vitals/Pain Pain Score: 4  Pain Location: left hip Pain Descriptors / Indicators: Discomfort;Grimacing Pain Intervention(s): Premedicated before session    Home Living                      Prior Function            PT Goals (current goals can now be found in the care plan section) Progress towards PT goals: Progressing toward goals    Frequency    7X/week      PT Plan Current plan remains appropriate    Co-evaluation  AM-PAC PT "6 Clicks" Mobility   Outcome Measure  Help needed turning from your back to your side while in a flat bed without using bedrails?: A Little Help needed moving from lying on your back to sitting on the side of a flat bed without using bedrails?: A Little Help needed moving to and from a bed to a chair (including a wheelchair)?: A Little Help needed standing up from a chair using your arms (e.g., wheelchair or bedside chair)?: A Little Help needed to walk in hospital room?: A Little Help needed climbing 3-5 steps with a railing? : A Little 6  Click Score: 18    End of Session Equipment Utilized During Treatment: Gait belt Activity Tolerance: Patient tolerated treatment well Patient left: in bed;with call bell/phone within reach;with family/visitor present Nurse Communication: Mobility status PT Visit Diagnosis: Unsteadiness on feet (R26.81);Repeated falls (R29.6);Muscle weakness (generalized) (M62.81);Dizziness and giddiness (R42);Other symptoms and signs involving the nervous system (R29.898)     Time: 1425-1500 PT Time Calculation (min) (ACUTE ONLY): 35 min  Charges:  $Gait Training: 8-22 mins $Therapeutic Exercise: 8-22 mins                     Tresa Endo PT Acute Rehabilitation Services Pager [phone] Office [phone]    Claretha Cooper 10/19/2020, 3:18 PM

## 2020-10-19 NOTE — TOC Transition Note (Signed)
Transition of Care [facility]) - CM/SW Discharge Note   Patient Details  Name: Hailey [name] MRN: [id number] Date of Birth: 1940/12/11  [facility] CM/SW Contact:  Lennart Pall, LCSW Phone Number: 10/19/2020, 10:16 AM   Clinical Narrative:    Met with pt and confirming she has all needed DME at home.  Plan for OPPT at [place].  No further TOC needs.   Final next level of care: OP Rehab Barriers to Discharge: No Barriers Identified   Patient Goals and CMS Choice Patient states their goals for this hospitalization and ongoing recovery are:: return home      Discharge Placement                       Discharge Plan and Services                DME Arranged: N/A DME Agency: NA                  Social Determinants of Health (SDOH) Interventions     Readmission Risk Interventions No flowsheet data found.

## 2020-10-19 NOTE — Plan of Care (Signed)
?  Problem: Activity: ?Goal: Risk for activity intolerance will decrease ?Outcome: Progressing ?  ?Problem: Safety: ?Goal: Ability to remain free from injury will improve ?Outcome: Progressing ?  ?Problem: Pain Managment: ?Goal: General experience of comfort will improve ?Outcome: Progressing ?  ?

## 2020-10-19 NOTE — Progress Notes (Signed)
Discharge package printed and instructions given to pt and caregiver. Both verbalize understanding. Notified pt that PA unable to contact pharmacy for meds delivery. Pt will call pharmacy once she is home.

## 2020-10-19 NOTE — Evaluation (Signed)
Occupational Therapy Evaluation Patient Details Name: Hailey [name] MRN: [id number] DOB: [birth year] Today's Date: 10/19/2020    History of Present Illness 80 yo female S/P total Hip arthroplasty through Anterior Approach on 10/18/20. PMH:SAH, depression,Carotid artery dis., R tibia fx, sacral fusion,  neck and back surgeries.   Clinical Impression   Patient is a 80 year old female who was admitted with above diagnosis. Currently patient is mod A for bed mobility, unable to participate in transfers with blood pressure dropping with transitions, increased fatigue. Patient indicated wanting to transition home today with caregiver support. Patient would need 24/7 care/physical assistance at home if transitioning home today. Patient would continue to benefit from skilled OT services at this time while admitted to address noted deficits in order to improve overall safety and independence in ADLs.   Blood pressures during session: Supine: 108/58 mmhg Sitting: 89/50 mmhg Standing: 75/44 mmhg With dizziness and feeling like passing out per patient report.     Follow Up Recommendations  Follow surgeon's recommendation for DC plan and follow-up therapies    Equipment Recommendations  Other (comment) (total hip kit)    Recommendations for Other Services       Precautions / Restrictions Precautions Precautions: Fall Precaution Comments: orthostatic? Restrictions Weight Bearing Restrictions: No LLE Weight Bearing: Weight bearing as tolerated      Mobility Bed Mobility Overal bed mobility: Needs Assistance Bed Mobility: Supine to Sit;Sit to Supine     Supine to sit: Mod assist Sit to supine: Max assist   General bed mobility comments: assit with legs, using bed pad to sit up. Both legs placed onto bed.    Transfers Overall transfer level: Needs assistance Equipment used: Rolling walker (2 wheeled) Transfers: Sit to/from Stand Sit to Stand: Mod assist         General transfer  comment: patient was noted to have strong posterior leaning in standing with onset of dizziness.    Balance Overall balance assessment: History of Falls;Needs assistance Sitting-balance support: Feet supported;Bilateral upper extremity supported Sitting balance-Leahy Scale: Fair     Standing balance support: During functional activity;Bilateral upper extremity supported Standing balance-Leahy Scale: Poor Standing balance comment: requires RW with strong posterior leaning noted in standing.                           ADL either performed or assessed with clinical judgement   ADL Overall ADL's : Needs assistance/impaired Eating/Feeding: Set up;Bed level   Grooming: Wash/dry face;Sitting Grooming Details (indicate cue type and reason): patient was noted to have ortostatic response with moving with patient unable to tolerate sitting or standing long Upper Body Bathing: Minimal assistance;Bed level   Lower Body Bathing: Bed level;Maximal assistance   Upper Body Dressing : Minimal assistance;Bed level   Lower Body Dressing: Bed level;Maximal assistance Lower Body Dressing Details (indicate cue type and reason): patient was demonstrated on how to use sock aid for LB Dressing but patient to fatigued to particiapte in attempting to participate in demonstration   Toilet Transfer Details (indicate cue type and reason): unable to attempt with patient feeling dizzy like she was going to pass out Toileting- Water quality scientist and Hygiene: Moderate assistance;Bed level         General ADL Comments: patients ADL tasks were limited on this date with patient having dizziness and drop in BP with transitions. patient was asked to take a few steps on second attempt at standing with patient reporting i am  going to pass out. patient was returned to supine with max A when sitting did not relieve symptoms. patient did not have LOC.     Vision   Vision Assessment?: No apparent visual  deficits     Perception     Praxis      Pertinent Vitals/Pain Pain Assessment: Faces Faces Pain Scale: Hurts a little bit Pain Location: left hip Pain Descriptors / Indicators: Aching Pain Intervention(s): Limited activity within patient's tolerance;Monitored during session     Hand Dominance Right   Extremity/Trunk Assessment Upper Extremity Assessment Upper Extremity Assessment: Overall WFL for tasks assessed   Lower Extremity Assessment Lower Extremity Assessment: Defer to PT evaluation   Cervical / Trunk Assessment Cervical / Trunk Assessment: Kyphotic;Other exceptions Cervical / Trunk Exceptions: lacks neck rotation   Communication Communication Communication: No difficulties   Cognition Arousal/Alertness: Awake/alert Behavior During Therapy: WFL for tasks assessed/performed Overall Cognitive Status: Within Functional Limits for tasks assessed                                 General Comments: "I'm slow"   General Comments       Exercises     Shoulder Instructions      Home Living Family/patient expects to be discharged to:: Private residence Living Arrangements: Non-relatives/Friends;Children Available Help at Discharge: Family;Friend(s);Available 24 hours/day Type of Home: House Home Access: Level entry Entrance Stairs-Number of Steps: 2 Entrance Stairs-Rails: Can reach both Home Layout: One level     Bathroom Shower/Tub: Occupational psychologist: Standard Bathroom Accessibility: Yes How Accessible: Accessible via walker Home Equipment: [place] - 2 wheels;Bedside commode;Cane - single point   Additional Comments: Pat a caregiver staying 24/7      Prior Functioning/Environment Level of Independence: Needs assistance  Gait / Transfers Assistance Needed: uses a cane, has balance issues ADL's / Homemaking Assistance Needed: requires assist with meal prep. patient reported she was doing bathing dressing and toileting herself.             OT Problem List: Decreased strength;Decreased activity tolerance;Impaired balance (sitting and/or standing);Decreased safety awareness;Cardiopulmonary status limiting activity;Decreased knowledge of precautions;Decreased knowledge of use of DME or AE      OT Treatment/Interventions: Self-care/ADL training;Therapeutic exercise;Energy conservation;DME and/or AE instruction;Therapeutic activities;Balance training;Patient/family education    OT Goals(Current goals can be found in the care plan section) Acute Rehab OT Goals Patient Stated Goal: to go home today OT Goal Formulation: With patient Time For Goal Achievement: 11/02/20 Potential to Achieve Goals: Good  OT Frequency: Min 2X/week   Barriers to D/C:            Co-evaluation              AM-PAC OT "6 Clicks" Daily Activity     Outcome Measure Help from another person eating meals?: None Help from another person taking care of personal grooming?: A Little Help from another person toileting, which includes using toliet, bedpan, or urinal?: A Lot Help from another person bathing (including washing, rinsing, drying)?: A Lot Help from another person to put on and taking off regular upper body clothing?: A Little Help from another person to put on and taking off regular lower body clothing?: A Lot 6 Click Score: 16   End of Session Equipment Utilized During Treatment: Gait belt;Rolling walker Nurse Communication: Mobility status;Other (comment) (blood pressures)  Activity Tolerance: Patient limited by fatigue Patient left: in bed;with call bell/phone  within reach;with family/visitor present  OT Visit Diagnosis: Unsteadiness on feet (R26.81);Muscle weakness (generalized) (M62.81);Dizziness and giddiness (R42)                Time: 3795-5831 OT Time Calculation (min): 43 min Charges:  OT General Charges $OT Visit: 1 Visit OT Evaluation $OT Eval Moderate Complexity: 1 Mod OT Treatments $Self Care/Home  Management : 23-37 mins  Jackelyn Poling OTR/L, MS Acute Rehabilitation Department Office# [phone] Pager# [phone]   [place] 10/19/2020, 8:38 AM

## 2020-10-21 ENCOUNTER — Encounter (HOSPITAL_COMMUNITY): Payer: Self-pay | Admitting: Orthopedic Surgery

## 2020-10-24 DIAGNOSIS — R262 Difficulty in walking, not elsewhere classified: Secondary | ICD-10-CM | POA: Diagnosis not present

## 2020-10-24 DIAGNOSIS — M25552 Pain in left hip: Secondary | ICD-10-CM | POA: Diagnosis not present

## 2020-10-24 DIAGNOSIS — M1612 Unilateral primary osteoarthritis, left hip: Secondary | ICD-10-CM | POA: Diagnosis not present

## 2020-10-24 DIAGNOSIS — M6281 Muscle weakness (generalized): Secondary | ICD-10-CM | POA: Diagnosis not present

## 2020-11-02 DIAGNOSIS — M1612 Unilateral primary osteoarthritis, left hip: Secondary | ICD-10-CM | POA: Diagnosis not present

## 2020-11-10 DIAGNOSIS — Z20822 Contact with and (suspected) exposure to covid-19: Secondary | ICD-10-CM | POA: Diagnosis not present

## 2020-11-18 ENCOUNTER — Inpatient Hospital Stay (HOSPITAL_COMMUNITY)
Admission: EM | Admit: 2020-11-18 | Discharge: 2020-11-23 | DRG: 312 | Disposition: A | Discharge status: Home Health | Payer: Medicare Other | Attending: Internal Medicine | Admitting: Internal Medicine

## 2020-11-18 ENCOUNTER — Encounter (HOSPITAL_COMMUNITY): Payer: Self-pay | Admitting: *Deleted

## 2020-11-18 ENCOUNTER — Emergency Department (HOSPITAL_COMMUNITY): Payer: Medicare Other

## 2020-11-18 ENCOUNTER — Other Ambulatory Visit: Payer: Self-pay

## 2020-11-18 DIAGNOSIS — K582 Mixed irritable bowel syndrome: Secondary | ICD-10-CM | POA: Diagnosis present

## 2020-11-18 DIAGNOSIS — W1839XA Other fall on same level, initial encounter: Secondary | ICD-10-CM | POA: Diagnosis present

## 2020-11-18 DIAGNOSIS — G8929 Other chronic pain: Secondary | ICD-10-CM | POA: Diagnosis present

## 2020-11-18 DIAGNOSIS — J302 Other seasonal allergic rhinitis: Secondary | ICD-10-CM

## 2020-11-18 DIAGNOSIS — S2243XS Multiple fractures of ribs, bilateral, sequela: Secondary | ICD-10-CM | POA: Diagnosis not present

## 2020-11-18 DIAGNOSIS — I5032 Chronic diastolic (congestive) heart failure: Secondary | ICD-10-CM | POA: Diagnosis not present

## 2020-11-18 DIAGNOSIS — Z96643 Presence of artificial hip joint, bilateral: Secondary | ICD-10-CM | POA: Diagnosis present

## 2020-11-18 DIAGNOSIS — E860 Dehydration: Secondary | ICD-10-CM | POA: Diagnosis present

## 2020-11-18 DIAGNOSIS — R55 Syncope and collapse: Secondary | ICD-10-CM | POA: Diagnosis present

## 2020-11-18 DIAGNOSIS — I6529 Occlusion and stenosis of unspecified carotid artery: Secondary | ICD-10-CM | POA: Diagnosis present

## 2020-11-18 DIAGNOSIS — Z23 Encounter for immunization: Secondary | ICD-10-CM

## 2020-11-18 DIAGNOSIS — E785 Hyperlipidemia, unspecified: Secondary | ICD-10-CM | POA: Diagnosis present

## 2020-11-18 DIAGNOSIS — Z7982 Long term (current) use of aspirin: Secondary | ICD-10-CM

## 2020-11-18 DIAGNOSIS — R6510 Systemic inflammatory response syndrome (SIRS) of non-infectious origin without acute organ dysfunction: Secondary | ICD-10-CM | POA: Diagnosis present

## 2020-11-18 DIAGNOSIS — M4312 Spondylolisthesis, cervical region: Secondary | ICD-10-CM | POA: Diagnosis not present

## 2020-11-18 DIAGNOSIS — N3010 Interstitial cystitis (chronic) without hematuria: Secondary | ICD-10-CM | POA: Diagnosis present

## 2020-11-18 DIAGNOSIS — G319 Degenerative disease of nervous system, unspecified: Secondary | ICD-10-CM | POA: Diagnosis not present

## 2020-11-18 DIAGNOSIS — S065XAA Traumatic subdural hemorrhage with loss of consciousness status unknown, initial encounter: Secondary | ICD-10-CM | POA: Diagnosis not present

## 2020-11-18 DIAGNOSIS — M1990 Unspecified osteoarthritis, unspecified site: Secondary | ICD-10-CM | POA: Diagnosis present

## 2020-11-18 DIAGNOSIS — R0902 Hypoxemia: Secondary | ICD-10-CM | POA: Diagnosis not present

## 2020-11-18 DIAGNOSIS — S0081XA Abrasion of other part of head, initial encounter: Secondary | ICD-10-CM | POA: Diagnosis present

## 2020-11-18 DIAGNOSIS — Z20822 Contact with and (suspected) exposure to covid-19: Secondary | ICD-10-CM | POA: Diagnosis present

## 2020-11-18 DIAGNOSIS — S0101XA Laceration without foreign body of scalp, initial encounter: Secondary | ICD-10-CM | POA: Diagnosis not present

## 2020-11-18 DIAGNOSIS — R509 Fever, unspecified: Secondary | ICD-10-CM

## 2020-11-18 DIAGNOSIS — I951 Orthostatic hypotension: Secondary | ICD-10-CM | POA: Diagnosis not present

## 2020-11-18 DIAGNOSIS — E222 Syndrome of inappropriate secretion of antidiuretic hormone: Secondary | ICD-10-CM | POA: Diagnosis not present

## 2020-11-18 DIAGNOSIS — G4700 Insomnia, unspecified: Secondary | ICD-10-CM | POA: Diagnosis present

## 2020-11-18 DIAGNOSIS — Z8679 Personal history of other diseases of the circulatory system: Secondary | ICD-10-CM

## 2020-11-18 DIAGNOSIS — E7841 Elevated Lipoprotein(a): Secondary | ICD-10-CM

## 2020-11-18 DIAGNOSIS — Y92002 Bathroom of unspecified non-institutional (private) residence single-family (private) house as the place of occurrence of the external cause: Secondary | ICD-10-CM

## 2020-11-18 DIAGNOSIS — J3089 Other allergic rhinitis: Secondary | ICD-10-CM

## 2020-11-18 DIAGNOSIS — Z79899 Other long term (current) drug therapy: Secondary | ICD-10-CM

## 2020-11-18 DIAGNOSIS — F32A Depression, unspecified: Secondary | ICD-10-CM | POA: Diagnosis present

## 2020-11-18 DIAGNOSIS — I6523 Occlusion and stenosis of bilateral carotid arteries: Secondary | ICD-10-CM | POA: Diagnosis present

## 2020-11-18 DIAGNOSIS — E876 Hypokalemia: Secondary | ICD-10-CM | POA: Diagnosis present

## 2020-11-18 DIAGNOSIS — Z9181 History of falling: Secondary | ICD-10-CM

## 2020-11-18 DIAGNOSIS — R64 Cachexia: Secondary | ICD-10-CM | POA: Diagnosis present

## 2020-11-18 DIAGNOSIS — Z888 Allergy status to other drugs, medicaments and biological substances status: Secondary | ICD-10-CM

## 2020-11-18 DIAGNOSIS — E440 Moderate protein-calorie malnutrition: Secondary | ICD-10-CM | POA: Diagnosis present

## 2020-11-18 DIAGNOSIS — G4733 Obstructive sleep apnea (adult) (pediatric): Secondary | ICD-10-CM | POA: Diagnosis present

## 2020-11-18 DIAGNOSIS — R197 Diarrhea, unspecified: Secondary | ICD-10-CM | POA: Diagnosis not present

## 2020-11-18 DIAGNOSIS — N19 Unspecified kidney failure: Secondary | ICD-10-CM | POA: Diagnosis present

## 2020-11-18 DIAGNOSIS — R7989 Other specified abnormal findings of blood chemistry: Secondary | ICD-10-CM | POA: Diagnosis present

## 2020-11-18 DIAGNOSIS — Z8249 Family history of ischemic heart disease and other diseases of the circulatory system: Secondary | ICD-10-CM

## 2020-11-18 DIAGNOSIS — Z96612 Presence of left artificial shoulder joint: Secondary | ICD-10-CM | POA: Diagnosis present

## 2020-11-18 DIAGNOSIS — S2239XA Fracture of one rib, unspecified side, initial encounter for closed fracture: Secondary | ICD-10-CM | POA: Diagnosis present

## 2020-11-18 DIAGNOSIS — S0001XA Abrasion of scalp, initial encounter: Secondary | ICD-10-CM | POA: Diagnosis present

## 2020-11-18 DIAGNOSIS — G9389 Other specified disorders of brain: Secondary | ICD-10-CM | POA: Diagnosis not present

## 2020-11-18 DIAGNOSIS — S199XXA Unspecified injury of neck, initial encounter: Secondary | ICD-10-CM | POA: Diagnosis not present

## 2020-11-18 DIAGNOSIS — D638 Anemia in other chronic diseases classified elsewhere: Secondary | ICD-10-CM | POA: Diagnosis present

## 2020-11-18 DIAGNOSIS — Z981 Arthrodesis status: Secondary | ICD-10-CM

## 2020-11-18 DIAGNOSIS — Z87891 Personal history of nicotine dependence: Secondary | ICD-10-CM

## 2020-11-18 DIAGNOSIS — M47812 Spondylosis without myelopathy or radiculopathy, cervical region: Secondary | ICD-10-CM | POA: Diagnosis not present

## 2020-11-18 DIAGNOSIS — Z681 Body mass index (BMI) 19 or less, adult: Secondary | ICD-10-CM

## 2020-11-18 DIAGNOSIS — F5104 Psychophysiologic insomnia: Secondary | ICD-10-CM | POA: Diagnosis present

## 2020-11-18 DIAGNOSIS — I110 Hypertensive heart disease with heart failure: Secondary | ICD-10-CM | POA: Diagnosis present

## 2020-11-18 DIAGNOSIS — I959 Hypotension, unspecified: Secondary | ICD-10-CM | POA: Diagnosis not present

## 2020-11-18 DIAGNOSIS — K219 Gastro-esophageal reflux disease without esophagitis: Secondary | ICD-10-CM | POA: Diagnosis present

## 2020-11-18 LAB — CBG MONITORING, ED: Glucose-Capillary: 99 mg/dL (ref 70–99)

## 2020-11-18 LAB — BASIC METABOLIC PANEL
Anion gap: 9 (ref 5–15)
BUN: 17 mg/dL (ref 8–23)
CO2: 24 mmol/L (ref 22–32)
Calcium: 8.5 mg/dL — ABNORMAL LOW (ref 8.9–10.3)
Chloride: 101 mmol/L (ref 98–111)
Creatinine, Ser: 0.73 mg/dL (ref 0.44–1.00)
GFR, Estimated: >60 mL/min (ref >60)
Glucose, Bld: 105 mg/dL — ABNORMAL HIGH (ref 70–99)
Potassium: 4.1 mmol/L (ref 3.5–5.1)
Sodium: 134 mmol/L — ABNORMAL LOW (ref 135–145)

## 2020-11-18 LAB — PROTIME-INR
INR: 1 (ref 0.8–1.2)
Prothrombin Time: 13.3 seconds (ref 11.4–15.2)

## 2020-11-18 LAB — CBC WITH DIFFERENTIAL/PLATELET
Abs Immature Granulocytes: 0.02 10*3/uL (ref 0.00–0.07)
Basophils Absolute: 0 10*3/uL (ref 0.0–0.1)
Basophils Relative: 0 %
Eosinophils Absolute: 0 10*3/uL (ref 0.0–0.5)
Eosinophils Relative: 1 %
HCT: 32.8 % — ABNORMAL LOW (ref 36.0–46.0)
Hemoglobin: 10.2 g/dL — ABNORMAL LOW (ref 12.0–15.0)
Immature Granulocytes: 0 %
Lymphocytes Relative: 11 %
Lymphs Abs: 0.7 10*3/uL (ref 0.7–4.0)
MCH: 29.1 pg (ref 26.0–34.0)
MCHC: 31.1 g/dL (ref 30.0–36.0)
MCV: 93.4 fL (ref 80.0–100.0)
Monocytes Absolute: 0.4 10*3/uL (ref 0.1–1.0)
Monocytes Relative: 7 %
Neutro Abs: 5.1 10*3/uL (ref 1.7–7.7)
Neutrophils Relative %: 81 %
Platelets: 141 10*3/uL — ABNORMAL LOW (ref 150–400)
RBC: 3.51 MIL/uL — ABNORMAL LOW (ref 3.87–5.11)
RDW: 14.4 % (ref 11.5–15.5)
WBC: 6.3 10*3/uL (ref 4.0–10.5)
nRBC: 0 % (ref 0.0–0.2)

## 2020-11-18 LAB — RESP PANEL BY RT-PCR (FLU A&B, COVID) ARPGX2
Influenza A by PCR: NEGATIVE
Influenza B by PCR: NEGATIVE
SARS Coronavirus 2 by RT PCR: NEGATIVE

## 2020-11-18 LAB — TROPONIN I (HIGH SENSITIVITY)
Troponin I (High Sensitivity): 5 ng/L (ref <18)
Troponin I (High Sensitivity): 4 ng/L (ref <18)

## 2020-11-18 MED ORDER — LACTATED RINGERS IV SOLN: INTRAVENOUS | Status: AC

## 2020-11-18 MED ORDER — SODIUM CHLORIDE 0.9 % IV SOLN: Freq: Once | INTRAVENOUS | Status: AC

## 2020-11-18 MED ORDER — SODIUM CHLORIDE 0.9 % IV BOLUS
500.0000 mL | Freq: Once | INTRAVENOUS | Status: AC
  Administered 2020-11-18: 500 mL via INTRAVENOUS

## 2020-11-18 MED ORDER — GABAPENTIN 300 MG PO CAPS
300.0000 mg | ORAL_CAPSULE | Freq: Every day | ORAL | Status: DC
  Administered 2020-11-19 – 2020-11-23 (×5): 300 mg via ORAL
  Filled 2020-11-18 (×5): qty 1

## 2020-11-18 MED ORDER — ESCITALOPRAM OXALATE 10 MG PO TABS
20.0000 mg | ORAL_TABLET | Freq: Every day | ORAL | Status: DC
  Administered 2020-11-19 – 2020-11-23 (×5): 20 mg via ORAL
  Filled 2020-11-18 (×5): qty 2

## 2020-11-18 MED ORDER — NALOXONE HCL 0.4 MG/ML IJ SOLN: 0.4000 mg | INTRAMUSCULAR | Status: DC | PRN

## 2020-11-18 MED ORDER — LIDOCAINE-EPINEPHRINE (PF) 2 %-1:200000 IJ SOLN
10.0000 mL | Freq: Once | INTRAMUSCULAR | Status: DC
  Filled 2020-11-18: qty 20

## 2020-11-18 MED ORDER — LORAZEPAM 1 MG PO TABS
1.0000 mg | ORAL_TABLET | Freq: Every evening | ORAL | Status: DC | PRN
  Administered 2020-11-19 – 2020-11-22 (×4): 1 mg via ORAL
  Filled 2020-11-18 (×4): qty 1

## 2020-11-18 MED ORDER — ACETAMINOPHEN 325 MG PO TABS
650.0000 mg | ORAL_TABLET | Freq: Once | ORAL | Status: AC
  Administered 2020-11-18: 650 mg via ORAL
  Filled 2020-11-18: qty 2

## 2020-11-18 MED ORDER — HYDROCODONE-ACETAMINOPHEN 5-325 MG PO TABS
1.0000 | ORAL_TABLET | Freq: Four times a day (QID) | ORAL | Status: DC | PRN
Start: 2020-11-18 — End: 2020-11-23
  Administered 2020-11-18 – 2020-11-22 (×7): 1 via ORAL
  Filled 2020-11-18 (×7): qty 1

## 2020-11-18 MED ORDER — FLUTICASONE PROPIONATE 50 MCG/ACT NA SUSP: 1.0000 | Freq: Two times a day (BID) | NASAL | Status: DC | PRN

## 2020-11-18 MED ORDER — FENTANYL CITRATE PF 50 MCG/ML IJ SOSY
25.0000 ug | PREFILLED_SYRINGE | Freq: Once | INTRAMUSCULAR | Status: AC
  Administered 2020-11-18: 25 ug via INTRAVENOUS
  Filled 2020-11-18: qty 1

## 2020-11-18 MED ORDER — ATORVASTATIN CALCIUM 40 MG PO TABS
40.0000 mg | ORAL_TABLET | Freq: Every day | ORAL | Status: DC
  Administered 2020-11-19 – 2020-11-23 (×5): 40 mg via ORAL
  Filled 2020-11-18 (×6): qty 1

## 2020-11-18 MED ORDER — ACETAMINOPHEN 650 MG RE SUPP: 650.0000 mg | Freq: Four times a day (QID) | RECTAL | Status: DC | PRN

## 2020-11-18 MED ORDER — ACETAMINOPHEN 325 MG PO TABS
650.0000 mg | ORAL_TABLET | Freq: Four times a day (QID) | ORAL | Status: DC | PRN
  Administered 2020-11-19 – 2020-11-23 (×7): 650 mg via ORAL
  Filled 2020-11-18 (×7): qty 2

## 2020-11-18 NOTE — H&P (Signed)
History and Physical    PLEASE NOTE THAT DRAGON DICTATION SOFTWARE WAS USED IN THE CONSTRUCTION OF THIS NOTE.   Hailey [name] UKG:[id number] DOB: 1940-05-11 DOA: 11/18/2020  PCP: Hoyt Koch, MD Patient coming from: home   I have personally briefly reviewed patient's old medical records in [place]  Chief Complaint: Loss of consciousness  HPI: Hailey [name] is a 80 y.o. female with medical history significant for chronic diastolic heart failure, hyperlipidemia, subarachnoid hemorrhage in December 2015, who is admitted to [facility] on 11/18/2020 for further evaluation management of syncope after presenting from home to [facility] ED complaining of such.   The patient reports that she awoke this morning around 9 AM without any acute symptoms at that time.  Shortly after waking up, she had 1 episode of loose stool in the absence of any associated melena or hematochezia.  Upon rising from a seated position on the toilet to standing, the patient developed dizziness, lightheadedness, and the subjective sensation of impending loss of consciousness.  While experiencing the symptoms, the patient attempted to ambulate back to her bedroom to lay down, but lost consciousness for reaching her bed, resulting in her falling to the floor.  She conveys that the loss of consciousness occurred prior to her actually hitting the floor.  Episode was unwitnessed, and the patient is unsure as to the specific duration in which she was unconscious, but believes that it was only a few seconds.  Upon regaining consciousness, the patient reports a left-sided headache from where she believes that she struck the left side of her head as a component of the above syncopal episode.   As relates to this episode of syncope, the patient denies any recent, immediately preceding, or ensuing chest pain, shortness of breath, palpitations, diaphoresis, nausea, vomiting.  She reports that the episode of loose stool that  she had this morning immediately prior to her syncopal episode was an isolated episode, denying any recent will in the days leading up to this event, and also denying any ensuing loose stool.  She confirms no recent melena or hematochezia.  This episode was not associated with any generalized tonic-clonic activity, nor associate with any tongue biting or loss of bowel/bladder function.  The patient denies any associated acute focal weakness, acute focal numbness, paresthesias, facial droop, slurred speech, expressive aphasia, acute change in vision, dysphagia, vertigo.  At the time of my encounter with her, she reports interval improvement in the mild left-sided headache that she was experiencing earlier today.  Denies any associated neck pain. Denies any additional resultant acute arthralgias or myalgias, acute hip pain.   Denies any recent subjective fever, chills, rigors, or generalized myalgias.  No recent neck stiffness, rhinitis, rhinorrhea, sore throat, wheezing, cough, abdominal pain, or rash.  No recent traveling or known COVID-19 exposures. Not associated with any recent dysuria, gross hematuria, or change in urinary urgency/frequency.  Of note, the patient recently underwent total left hip arthroplasty on 10/18/2020, following which she was on aspirin 81 mg p.o. twice daily for 30 days for postoperative DVT prophylaxis.  She completed this 30-day regimen of aspirin, with last doses administered on November 16, 2020.  Subsequently, she is on no blood thinners as an outpatient, including no residual aspirin.   She denies any recent chest pain, shortness of breath, hemoptysis, new calf tenderness, or new lower extremity erythema.  She also denies any recent worsening of peripheral edema.    Per chart review, there is a  history of subarachnoid hemorrhage in December 2015, but denies any ensuing intracranial hemorrhage.  Medical history notable for essential hypertension, which appears to be managed via  lifestyle modifications, in the absence of any antihypertensive medications at home.  Medical history also notable for chronic diastolic heart failure, with most recent echocardiogram in November 2016 showing normal ventricular cavity size, normal left ventricular wall thickness, LVEF 55 to 60%, no focal wall motion normalities, grade 1 diastolic dysfunction, no evidence of significant valvular pathology.  Additionally, she underwent bilateral carotid ultrasound in November 2018, which showed no evidence of hemodynamically significant plaque.  Per chart review, baseline hemoglobin range appears to be 10-13, with most recent prior hemoglobin found to be 12.4 on 10/05/2020.    ED Course:  Vital signs in the ED were notable for the following: Temperature max 99.5, heart rate 75-85; blood pressure 123/60 -151/77; respiratory rate 15-18, oxygen saturation 93 to 98% on room air.  BMP notable for the following: Potassium 4.1, carbon 24, BUN 17, creatinine 0.73 relative to most recent prior value 0.67 on 10/05/2020,  Labs were notable for the following: BUN to creatinine ratio 23.3, glucose 105.  High-sensitivity troponin high initially noted to be 5, peak value trending down to 4.  CBC notable for low blood cell count 6300, hemoglobin 10.2 associated with normocytic/normochromic findings as well as nonelevated RDW, platelet count 141.  INR 1.0.  Screening nasopharyngeal COVID-19/influenza PCR were checked in the ED today, with results currently pending.  Imaging and additional notable ED work-up: EKG showed sinus rhythm with heart rate 84, normal intervals, no evidence of T wave or ST changes, including no evidence of ST elevation.  Chest x-ray showed chronic appearing bilateral rib fractures without evidence of infiltrate, edema, effusion, or pneumothorax.  Noncontrast CT of the head showed a thin subdural hematoma on the left without evidence of associated mass-effect, also showing a small left parietal scalp  laceration without evidence of skull fracture.  CT cervical spine showed no evidence of acute cervical fracture or traumatic subluxation.  EDP discussed the patient's case and imaging with the on-call neurosurgeon, Dr.McDaniel, Who recommended serial neurochecks in the ED, with subsequent discharge from a neurosurgical standpoint if no evidence of acute focal neurologic deficit, without any recommended need for scheduled follow-up CT head, nor any need for outpatient follow-up in neurosurgery clinic.  While in the ED, the following were administered: Tylenol 650 mg p.o. x1, fentanyl 25 mg IV x1, normal saline bolus x500 cc.  Subsequently, the patient was admitted for overnight observation for further evaluation and management of presenting syncope.     Review of Systems: As per HPI otherwise 10 point review of systems negative.   Past Medical History:  Diagnosis Date   Allergic rhinitis, cause unspecified    Carotid artery disease ([place]) 12/13/2014   1-39% stenosis of bilateral CCAs.   Repeat ultrasound 11/2016.   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   OSA (obstructive sleep apnea)    Osteoarthrosis, unspecified whether generalized or localized, unspecified site    Other and unspecified hyperlipidemia    Personal history of colonic adenomas 11/28/2012   Personal history of other disorders of  nervous system and sense organs    SAH (subarachnoid hemorrhage) ([place]) 02/02/2014   Unspecified closed fracture of pelvis    x 2 2004/2006   Unspecified essential hypertension     Past Surgical History:  Procedure Laterality Date   ANKLE FUSION Left    BACK SURGERY     ; has  had 8 back surgeries    CERVICAL FUSION     C4-7   COLONOSCOPY     CYSTOSCOPY     HERNIA REPAIR     umbilical   LUMBAR FUSION     L2-3   REVERSE SHOULDER ARTHROPLASTY Left 12/10/2017   Procedure: LEFT REVERSE SHOULDER ARTHROPLASTY;  Surgeon: Marchia Bond, MD;  Location: [facility];  Service: Orthopedics;  Laterality: Left;  Interscalene Block   SACRAL FUSION  07/2012   TIBIA FRACTURE SURGERY Right    TONSILLECTOMY     TOTAL HIP ARTHROPLASTY Right 2012   TOTAL HIP ARTHROPLASTY Left 10/18/2020   Procedure: TOTAL HIP ARTHROPLASTY ANTERIOR APPROACH;  Surgeon: Renette Butters, MD;  Location: [facility];  Service: Orthopedics;  Laterality: Left;   TUBAL LIGATION     UPPER GASTROINTESTINAL ENDOSCOPY      Social History:  reports that she quit smoking about 53 years ago. Her smoking use included cigarettes. She has a 20.00 pack-year smoking history. She has never used smokeless tobacco. She reports that she does not drink alcohol and does not use drugs.   Allergies  Allergen Reactions   Resorcinol Swelling and Other (See Comments)    blisters    Family History  Problem Relation Age of Onset   Dementia Mother    Hypertension Father    Heart attack Father    Allergic rhinitis Father    Cancer Maternal Aunt        breast cancer   Alzheimer's disease Maternal Aunt    Stroke Paternal Uncle    Cancer Cousin        breast cancer   Colon cancer Neg Hx    Angioedema Neg Hx    Asthma Neg Hx    Eczema Neg Hx     Family history reviewed and not pertinent    Prior to Admission medications   Medication Sig Start Date End Date Taking? Authorizing Provider  acetaminophen (TYLENOL) 500 MG tablet Take 2 tablets (1,000 mg total) by mouth every 8 (eight) hours as needed for mild pain or moderate pain. 10/19/20  Yes Britt Bottom, PA-C  aspirin EC 81 MG tablet Take 1 tablet (81 mg total) by mouth 2 (two) times daily. For DVT prophylaxis for 30 days after surgery. 10/19/20  Yes Gawne, Meghan M, PA-C  atorvastatin  (LIPITOR) 40 MG tablet Take 1 tablet (40 mg total) by mouth daily. 09/06/20  Yes Hoyt Koch, MD  denosumab (PROLIA) 60 MG/ML SOLN injection Inject 60 mg into the skin every 6 (six) months. Administer in upper arm, thigh, or abdomen   Yes [provider]  diclofenac sodium (VOLTAREN) 1 % GEL Apply 2 g topically at bedtime.   Yes [provider]  escitalopram (LEXAPRO) 20 MG tablet Take 1 tablet (20 mg total) by mouth daily. 09/06/20  Yes Hoyt Koch, MD  fluticasone [facility]) 50 MCG/ACT nasal spray Use 1 spray each nostril 1-2 times daily as needed Patient taking differently: Place 1 spray into both nostrils 2 (two) times daily. 02/14/16  Yes Bobbitt, [place] Muta, MD  gabapentin (NEURONTIN) 300 MG capsule Take 300 mg by mouth daily.  Yes [provider]  HYDROcodone-acetaminophen (NORCO) 10-325 MG tablet Take 1 tablet by mouth every 6 (six) hours as needed for severe pain. 10/19/20  Yes Gawne, Meghan M, PA-C  loperamide (IMODIUM) 2 MG capsule Take 2 mg by mouth as needed for diarrhea or loose stools.   Yes [provider]  LORazepam (ATIVAN) 1 MG tablet Take 1 tablet (1 mg total) by mouth at bedtime. 09/06/20  Yes Hoyt Koch, MD  meloxicam (MOBIC) 15 MG tablet Take 1 tablet (15 mg total) by mouth daily as needed for pain (and inflammation). 10/19/20  Yes Gawne, Meghan M, PA-C  methocarbamol (ROBAXIN) 500 MG tablet Take 1 tablet (500 mg total) by mouth every 8 (eight) hours as needed for muscle spasms. 10/19/20  Yes Gawne, Meghan M, PA-C  ondansetron (ZOFRAN ODT) 4 MG disintegrating tablet Take 1 tablet (4 mg total) by mouth 2 (two) times daily as needed for nausea or vomiting. 10/19/20  Yes Gawne, Meghan M, PA-C  traMADol-acetaminophen (ULTRACET) 37.5-325 MG tablet Take 1 tablet by mouth 2 (two) times daily as needed. 11/09/20  Yes [provider]  zolpidem (AMBIEN) 5 MG tablet Take 1 tablet (5 mg total) by mouth at bedtime as  needed. Patient taking differently: Take 5 mg by mouth at bedtime. 09/06/20  Yes Hoyt Koch, MD  hydrocortisone (ANUSOL-HC) 2.5 % rectal cream Place 1 application rectally 2 (two) times daily. Patient not taking: No sig reported 04/03/18   Hoyt Koch, MD  omeprazole (PRILOSEC OTC) 20 MG tablet Take 1 tablet (20 mg total) by mouth daily. For gastric protection Patient not taking: No sig reported 10/19/20 11/18/20  Britt Bottom, PA-C     Objective    Physical Exam: Vitals:   11/18/20 1700 11/18/20 1715 11/18/20 1730 11/18/20 1900  BP: 112/68 123/67 125/69 (!) 151/77  Pulse: 75 78 79 79  Resp: 16 18 16 12   Temp:      TempSrc:      SpO2: 96% 93% 93% 98%  Weight:      Height:        General: appears to be stated age; alert, oriented Skin: warm, dry, no rash Head: Small left parietal scalp lac with appearance of hemostasis, otherwise, AT/ Mouth:  Oral mucosa membranes appear dry, normal dentition Neck: supple; trachea midline Heart:  RRR; did not appreciate any M/R/G Lungs: CTAB, did not appreciate any wheezes, rales, or rhonchi Abdomen: + BS; soft, ND, NT Vascular: 2+ pedal pulses b/l; 2+ radial pulses b/l Extremities: no peripheral edema, no muscle wasting Neuro: strength and sensation intact in upper and lower extremities b/l    Labs on Admission: I have personally reviewed following labs and imaging studies  CBC: Recent Labs  Lab 11/18/20 1201  WBC 6.3  NEUTROABS 5.1  HGB 10.2*  HCT 32.8*  MCV 93.4  PLT 970*   Basic Metabolic Panel: Recent Labs  Lab 11/18/20 1201  NA 134*  K 4.1  CL 101  CO2 24  GLUCOSE 105*  BUN 17  CREATININE 0.73  CALCIUM 8.5*   GFR: Estimated Creatinine Clearance: 51 mL/min (by C-G formula based on SCr of 0.73 mg/dL). Liver Function Tests: No results for input(s): AST, ALT, ALKPHOS, BILITOT, PROT, ALBUMIN in the last 168 hours. No results for input(s): LIPASE, AMYLASE in the last 168 hours. No results for  input(s): AMMONIA in the last 168 hours. Coagulation Profile: Recent Labs  Lab 11/18/20 1201  INR 1.0   Cardiac Enzymes: No results  for input(s): CKTOTAL, CKMB, CKMBINDEX, TROPONINI in the last 168 hours. BNP (last 3 results) No results for input(s): PROBNP in the last 8760 hours. HbA1C: No results for input(s): HGBA1C in the last 72 hours. CBG: Recent Labs  Lab 11/18/20 1900  GLUCAP 99   Lipid Profile: No results for input(s): CHOL, HDL, LDLCALC, TRIG, CHOLHDL, LDLDIRECT in the last 72 hours. Thyroid Function Tests: No results for input(s): TSH, T4TOTAL, FREET4, T3FREE, THYROIDAB in the last 72 hours. Anemia Panel: No results for input(s): VITAMINB12, FOLATE, FERRITIN, TIBC, IRON, RETICCTPCT in the last 72 hours. Urine analysis:    Component Value Date/Time   COLORURINE YELLOW 08/03/2014 0509   APPEARANCEUR CLEAR 08/03/2014 0509   LABSPEC 1.016 08/03/2014 0509   PHURINE 6.0 08/03/2014 0509   GLUCOSEU NEGATIVE 08/03/2014 0509   HGBUR NEGATIVE 08/03/2014 0509   HGBUR large 06/07/2008 1455   BILIRUBINUR NEGATIVE 08/03/2014 0509   KETONESUR NEGATIVE 08/03/2014 0509   PROTEINUR NEGATIVE 08/03/2014 0509   UROBILINOGEN 0.2 08/03/2014 0509   NITRITE NEGATIVE 08/03/2014 0509   LEUKOCYTESUR TRACE (A) 08/03/2014 0509    Radiological Exams on Admission: CT HEAD WO CONTRAST (5MM)  Result Date: 11/18/2020 CLINICAL DATA:  Syncopal episode.  Head trauma. EXAM: CT HEAD WITHOUT CONTRAST CT CERVICAL SPINE WITHOUT CONTRAST TECHNIQUE: Multidetector CT imaging of the head and cervical spine was performed following the standard protocol without intravenous contrast. Multiplanar CT image reconstructions of the cervical spine were also generated. COMPARISON:  CT head without contrast 08/18/2015. CT cervical spine without contrast 08/25/2015 FINDINGS: CT HEAD FINDINGS Brain: A thin isodense to slightly hyperdense extra-axial collection is present on the left. This is best seen on the coronal  images. No adjacent fracture is present. No significant mass effect is present. Moderate atrophy is present. Extensive white matter disease is noted. Basal ganglia are intact. None no cortical infarct is present. The brainstem and cerebellum are within normal limits. Vascular: No hyperdense vessel or unexpected calcification. Skull: Calvarium is intact. No focal lytic or blastic lesions are present. A high left parietal scalp laceration and hematoma is present without an underlying fracture. Sinuses/Orbits: The paranasal sinuses and mastoid air cells are clear. The globes and orbits are within normal limits. CT CERVICAL SPINE FINDINGS Alignment: Fused retrolisthesis is present at C5-6 and C6-7. Slight anterolisthesis at C3-4 is fused. Hard collar is in place. Skull base and vertebrae: Craniocervical junction within normal limits. Moderate degenerative changes present at C1-2. C2 lateral mass screws are present bilaterally. Cervical spine is fused posteriorly C2-C6. Wide laminectomy present at C5 and C6. Soft tissues and spinal canal: Soft tissues of the neck are unremarkable. Disc levels: Facet hypertrophy contributes to a multilevel foraminal narrowing at C3-4 fall with C4-5, C5-6, and C6-7. Wide laminectomy present at C5 and C6. Upper chest: The lung apices are clear. IMPRESSION: 1. Thin isodense to slightly hyperdense extra-axial collection on the left consistent with a thin subdural hematoma. No significant mass effect is present. 2. High left parietal scalp laceration and hematoma without underlying fracture. 3. Multilevel degenerative changes of the cervical spine without acute fracture or traumatic subluxation. 4. Posterior fusion C2-C6 with wide laminectomy at C5 and C6. These results were called by telephone at the time of interpretation on 11/18/2020 at 3:38pm to provider [facility] , who verbally acknowledged these results. Electronically Signed   By: San Morelle M.D.   On: 11/18/2020 15:54    CT Cervical Spine Wo Contrast  Result Date: 11/18/2020 CLINICAL DATA:  Syncopal episode.  Head trauma. EXAM: CT HEAD WITHOUT CONTRAST CT CERVICAL SPINE WITHOUT CONTRAST TECHNIQUE: Multidetector CT imaging of the head and cervical spine was performed following the standard protocol without intravenous contrast. Multiplanar CT image reconstructions of the cervical spine were also generated. COMPARISON:  CT head without contrast 08/18/2015. CT cervical spine without contrast 08/25/2015 FINDINGS: CT HEAD FINDINGS Brain: A thin isodense to slightly hyperdense extra-axial collection is present on the left. This is best seen on the coronal images. No adjacent fracture is present. No significant mass effect is present. Moderate atrophy is present. Extensive white matter disease is noted. Basal ganglia are intact. None no cortical infarct is present. The brainstem and cerebellum are within normal limits. Vascular: No hyperdense vessel or unexpected calcification. Skull: Calvarium is intact. No focal lytic or blastic lesions are present. A high left parietal scalp laceration and hematoma is present without an underlying fracture. Sinuses/Orbits: The paranasal sinuses and mastoid air cells are clear. The globes and orbits are within normal limits. CT CERVICAL SPINE FINDINGS Alignment: Fused retrolisthesis is present at C5-6 and C6-7. Slight anterolisthesis at C3-4 is fused. Hard collar is in place. Skull base and vertebrae: Craniocervical junction within normal limits. Moderate degenerative changes present at C1-2. C2 lateral mass screws are present bilaterally. Cervical spine is fused posteriorly C2-C6. Wide laminectomy present at C5 and C6. Soft tissues and spinal canal: Soft tissues of the neck are unremarkable. Disc levels: Facet hypertrophy contributes to a multilevel foraminal narrowing at C3-4 fall with C4-5, C5-6, and C6-7. Wide laminectomy present at C5 and C6. Upper chest: The lung apices are clear. IMPRESSION:  1. Thin isodense to slightly hyperdense extra-axial collection on the left consistent with a thin subdural hematoma. No significant mass effect is present. 2. High left parietal scalp laceration and hematoma without underlying fracture. 3. Multilevel degenerative changes of the cervical spine without acute fracture or traumatic subluxation. 4. Posterior fusion C2-C6 with wide laminectomy at C5 and C6. These results were called by telephone at the time of interpretation on 11/18/2020 at 3:38pm to provider [facility] , who verbally acknowledged these results. Electronically Signed   By: San Morelle M.D.   On: 11/18/2020 15:54   DG Chest Portable 1 View  Result Date: 11/18/2020 CLINICAL DATA:  Syncope EXAM: PORTABLE CHEST 1 VIEW COMPARISON:  Chest x-ray dated November 17, 2013; CT chest dated July 19, 2015 FINDINGS: Cardiac and mediastinal contours are unchanged. No focal opacity. No evidence of large pleural effusion or pneumothorax. Calcified structure projecting over the left hemithorax, was present on prior 2017 CT and is located in the anterior chest wall soft tissues. Prior posterior fusion of the thoracic spine. Bilateral rib fractures. IMPRESSION: No focal opacity, large pleural effusion or evidence of pneumothorax. Bilateral rib fractures which are likely chronic. Correlate for chest wall tenderness. Calcified structure projecting over the left hemithorax was present on prior 2017 CT and is located in the anterior chest wall soft tissues. Electronically Signed   By: Yetta Glassman M.D.   On: 11/18/2020 14:37     EKG: Independently reviewed, with result as described above.    Assessment/Plan   Hailey [name] is a 80 y.o. female with medical history significant for chronic diastolic heart failure, hyperlipidemia, subarachnoid hemorrhage in December 2015, who is admitted to [facility] on 11/18/2020 for further evaluation management of syncope after presenting from home to [facility] ED  complaining of such.    Principal Problem:   Syncope Active Problems:   HLD (hyperlipidemia)  Seasonal and perennial allergic rhinitis   INSOMNIA   Closed rib fracture   Subdural hematoma   Acute prerenal azotemia   Chronic diastolic CHF (congestive heart failure) ([place])     #) Syncope: episode of syncope that occurred when the patient was moving from a seated to a standing position after episode of loose stool and associated with prodrome, suggestive of potential orthostatic hypotension in the setting of dehydration given patient's report of aforementioned loose stool correlating with clinical evidence to suggest dehydration including the presence of dry oral mucosal membranes as well as as evidence of acute prerenal azotemia on presenting labs, versus vasovagal influence in the setting of preceding bowel movement versus element of micturition syncope in this setting.   Not associated with any overt acute focal neurologic deficits, and it appears that the small left subdural hematoma identified on today CT head was as a consequence of the patient hitting her head as a result of the syncopal episode, as opposed to representing a factor contributing to her syncopal episode, as the patient denies any acute headache leading up to hitting her head this morning as a component of the above syncopal episode. Clinically, acute ischemic stroke versus seizures appear less likely at this time. Presentation appears less consistent with ACS at this time, with serial troponin found to be nonelevated, presenting EKG showing no evidence of acute ischemic changes, and the absence of any associated CP.  While less likely, will continue to trend serial troponin and check echocardiogram in the morning as right-sided MRI can present with episode of copious diarrhea.   Will check orthostatic vital signs, but with the caveat that the patient has already received IVF's in the ED, potentially altering the results of  this evaluation.  In setting of associated prodrome, the possibility of ventricular arrhythmia appears to be less likely at this time, although will closely monitor on telemetry overnight for any evidence of potential contributory arrhythmia, while also assessing serum Mg level.  Of note, bilateral carotid ultrasound in 2018 showed no evidence of hemodynamically significant plaque.     Plan: I have placed a nursing communication order requesting that orthostatic vital signs x 1 set be checked and documented, following which will initiate gentle IV fluids in the form of lactated Ringer's at 50 cc/h x 10 hours. Monitor on telemetry.   Monitor strict I's and O's.  Add-on serum Mg level. Check CMP, CBC, serum Mg level in the AM. Fall precautions ordered.  Continue to trend troponin.  Echocardiogram ordered for the morning.  Every 4 hours neurochecks x5 occurrences.      #) Subdural hematoma: Episode of patient striking the left side of her head on the floor as a consequence of her presenting syncopal episode, noncontrast CT head shows an subdural hematoma on the left without evidence of associated mass-effect. patient's case and imaging were discussed with the on-call neurosurgeon, Dr.McDaniel, Who recommended serial neurochecks in the ED, with subsequent discharge from a neurosurgical standpoint if no evidence of acute focal neurologic deficit, without any recommended need for scheduled follow-up CT head, nor any need for outpatient follow-up in neurosurgery clinic.  No evidence of acute focal neurologic deficit at this time.  Patient recently completed a 30-day course of baby aspirin twice daily for DVT prophylaxis in the postoperative setting of her recent left total hip arthroplasty, she completed this course on November 16, 2020, and is no longer on any blood thinners as an outpatient, including no residual aspirin.  Presenting INR  1.0.   Plan: Serial checks every 4 hours x5 occurrences.  Fall  precautions.  Repeat INR in morning.  Refraining from pharmacologic DVT prophylaxis.  SCDs.  Repeat CBC in the morning.  Physical therapy consult in the morning.      #) Acute prerenal azotemia: Per presenting labs, and consistent with mild dehydration concomitant with appearance of dry oral mucous membranes, with contribution from aforementioned episode of loose stool.  Not associated with any acute kidney injury, no evidence of objective hypotension in the ED thus far.  We will provide gentle IV fluids overnight, will closely monitoring for interval development of acute volume overload in the context of the patient's history of chronic diastolic heart failure, as further detailed below.  Plan: Lactated Ringer's at 50 cc/h x 10 hours.  Monitor strict I's and O's Daily weights.  Repeat CMP in the morning.      #) Chronic bilateral rib fractures: As evidenced by today's chest x-ray, and in the absence of any acute cardiopulmonary process, including no evidence of electrical edema, effusion, or pneumothorax.  Patient denies any recent chest or chest wall discomfort.  Plan: Continue home meds as needed Norco.  Incentive spirometry.  Repeat CBC in the morning.     #) Allergic rhinitis: Documented history of such, on scheduled Flonase as an outpatient.  Plan: Continue on Flonase.     #) Depression: On Lexapro as an outpatient.  No evidence of QTC prolongation on presenting EKG.  Plan: Continue home Lexapro.     #) Hyperlipidemia: Documented history of such, on high intensity atorvastatin as an outpatient.  Plan: Continue home statin.     #) Insomnia: Patient reports that she is on scheduled nightly Ativan as well as scheduled nightly Ambien for underlying insomnia.  In the setting of presenting syncope with resultant fall and subdural hematoma, will hold home Ambien for now.  Additionally, I have changed her scheduled nightly Ativan to as needed for insomnia.  Plan: Hold home  scheduled Ambien, as above.  Change scheduled Ativan to as needed, as above.     #) Chronic diastolic heart failure: documented history of such, with most recent echocardiogram performed in November 2016 notable for grade 1 diastolic dysfunction, with additional findings as further detailed above.. No clinical evidence to suggest acutely decompensated heart failure at this time.  Not on any scheduled diuretic medications at home.. Presenting EKG shows sinus rhythm without evidence of acute ischemic changes, as further detailed above.  Clinically, the patient appears mildly dehydrated at presentation, and will monitor closely for ensuing development of evidence of acute volume overload given plan for gentle IV fluid resuscitation overnight.    Plan: monitor strict I's & O's and daily weights. Repeat BMP in the morning. Check serum magnesium level.      DVT prophylaxis: SCDs Code Status: Full code Family Communication: none Disposition Plan: Per Rounding Team ;  Consults called:Case/imaging were discussed with the on-call neurosurgeon, as further detailed above  Admission status: Observation;   Of note, this patient was added by me to the following Admit List/Treatment Team:  mcadmits.   Of note, the Adult Admission Order Set (Multimorbid order set) was used by me in the admission process for this patient.  PLEASE NOTE THAT DRAGON DICTATION SOFTWARE WAS USED IN THE CONSTRUCTION OF THIS NOTE.   Rhetta Mura DO [facility] Pager [phone] From [place]   11/18/2020, 7:48 PM

## 2020-11-18 NOTE — ED Notes (Signed)
Lab at [facility] 

## 2020-11-18 NOTE — ED Provider Notes (Signed)
Seen after prior EDP.  Patient is comfortable at time of my reevaluation.  She is presenting status post reported syncopal event.  CT imaging revealed possible minimal subdural hematoma.  Patient is currently without headache or other concerning neurologic findings.  Prior EDP did discuss CT findings with neurosurgeon.  No indication for acute intervention for neurosurgery.  Patient would likely benefit for continued observation in the hospital.  Hospitalist service is aware of case and will evaluate for same.   Valarie Merino, MD 11/18/20 1949

## 2020-11-18 NOTE — ED Provider Notes (Addendum)
[facility] EMERGENCY DEPARTMENT Provider Note   CSN: [id number] Arrival date & time: 11/18/20  1147     History Chief Complaint  Patient presents with   Hailey [name] Hailey [name] is a 80 y.o. female.   Fall Associated symptoms include headaches. Pertinent negatives include no chest pain, no abdominal pain and no shortness of breath.  80 year old female with past medical history significant for carotid artery disease, osteoarthrosis, presenting to the emergency department after 2 syncopal episodes at home.  The patient states that she had a bout of diarrhea this morning on the toilet.  When she stood up she immediately passed out and struck the back of her head.  Life alert notified EMS and well with EMS she had a second syncopal episode while there helping her to her feet from a sitting position.  The patient did not remember the exact etiology of her syncope.  She endorses sudden onset moderate intensity neck pain, sharp, nonradiating, worse with movement.  She denies any weakness or numbness.  She endorses pain in the back of her head described as dull and throbbing with some bleeding noted.    Past Medical History:  Diagnosis Date   Allergic rhinitis, cause unspecified    Carotid artery disease ([place]) 12/13/2014   1-39% stenosis of bilateral CCAs.   Repeat ultrasound 11/2016.   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   OSA (obstructive sleep apnea)    Osteoarthrosis, unspecified whether generalized or localized, unspecified site    Other and unspecified hyperlipidemia    Personal history of colonic adenomas 11/28/2012   Personal history of other disorders of nervous  system and sense organs    SAH (subarachnoid hemorrhage) ([place]) 02/02/2014   Unspecified closed fracture of pelvis    x 2 2004/2006   Unspecified essential hypertension     Patient Active Problem List   Diagnosis Date Noted   Syncope 11/18/2020   S/P total left hip arthroplasty 10/18/2020   Preop cardiovascular exam 09/09/2020   Family history of thyroid disease 09/09/2020   Depression 09/09/2020   Post-traumatic osteoarthritis of multiple joints 10/13/2019   Subdural hematoma 04/17/2019   Left rotator cuff tear arthropathy 12/10/2017   Chronic pain syndrome 12/24/2015   Allergic urticaria 06/02/2015   Routine general medical examination at a health care facility 12/19/2014   Carotid artery disease ([place]) 12/13/2014   Alcohol abuse 02/27/2014   Syncope and collapse 11/19/2013   History of tobacco use 02/08/2013   IBS 03/20/2010   HLD (hyperlipidemia) 07/29/2007   INSOMNIA 07/15/2007   GERD 01/15/2007   OSTEOARTHRITIS 01/15/2007   Essential hypertension 07/01/2006   Seasonal and perennial allergic rhinitis 07/01/2006   OSTEOPENIA 07/01/2006    Past Surgical History:  Procedure Laterality Date   ANKLE FUSION Left    BACK SURGERY     ; has  had 8 back surgeries   CERVICAL FUSION     C4-7   COLONOSCOPY     CYSTOSCOPY     HERNIA REPAIR     umbilical   LUMBAR FUSION     L2-3   REVERSE SHOULDER ARTHROPLASTY Left 12/10/2017   Procedure:  LEFT REVERSE SHOULDER ARTHROPLASTY;  Surgeon: Marchia Bond, MD;  Location: [facility];  Service: Orthopedics;  Laterality: Left;  Interscalene Block   SACRAL FUSION  07/2012   TIBIA FRACTURE SURGERY Right    TONSILLECTOMY     TOTAL HIP ARTHROPLASTY Right 2012   TOTAL HIP ARTHROPLASTY Left 10/18/2020   Procedure: TOTAL HIP ARTHROPLASTY ANTERIOR APPROACH;  Surgeon: Renette Butters, MD;  Location: [facility];  Service: Orthopedics;  Laterality: Left;   TUBAL LIGATION     UPPER GASTROINTESTINAL ENDOSCOPY       OB History   No obstetric history  on file.     Family History  Problem Relation Age of Onset   Dementia Mother    Hypertension Father    Heart attack Father    Allergic rhinitis Father    Cancer Maternal Aunt        breast cancer   Alzheimer's disease Maternal Aunt    Stroke Paternal Uncle    Cancer Cousin        breast cancer   Colon cancer Neg Hx    Angioedema Neg Hx    Asthma Neg Hx    Eczema Neg Hx     Social History   Tobacco Use   Smoking status: Former    Packs/day: 2.00    Years: 10.00    Pack years: 20.00    Types: Cigarettes    Quit date: 02/13/1967    Years since quitting: 53.8   Smokeless tobacco: Never   Tobacco comments:    smoked 1961-1969 , up to 3 ppd, mainly 2 ppd  Vaping Use   Vaping Use: Never used  Substance Use Topics   Alcohol use: No   Drug use: No    Home Medications Prior to Admission medications   Medication Sig Start Date End Date Taking? Authorizing Provider  acetaminophen (TYLENOL) 500 MG tablet Take 2 tablets (1,000 mg total) by mouth every 8 (eight) hours as needed for mild pain or moderate pain. 10/19/20  Yes Britt Bottom, PA-C  aspirin EC 81 MG tablet Take 1 tablet (81 mg total) by mouth 2 (two) times daily. For DVT prophylaxis for 30 days after surgery. 10/19/20  Yes Gawne, Meghan M, PA-C  atorvastatin (LIPITOR) 40 MG tablet Take 1 tablet (40 mg total) by mouth daily. 09/06/20  Yes Hoyt Koch, MD  denosumab (PROLIA) 60 MG/ML SOLN injection Inject 60 mg into the skin every 6 (six) months. Administer in upper arm, thigh, or abdomen   Yes [provider]  diclofenac sodium (VOLTAREN) 1 % GEL Apply 2 g topically at bedtime.   Yes [provider]  escitalopram (LEXAPRO) 20 MG tablet Take 1 tablet (20 mg total) by mouth daily. 09/06/20  Yes Hoyt Koch, MD  fluticasone [name]) 50 MCG/ACT nasal spray Use 1 spray each nostril 1-2 times daily as needed Patient taking differently: Place 1 spray into both nostrils 2 (two) times daily.  02/14/16  Yes Bobbitt, [place] Muta, MD  gabapentin (NEURONTIN) 300 MG capsule Take 300 mg by mouth daily.   Yes [provider]  HYDROcodone-acetaminophen (NORCO) 10-325 MG tablet Take 1 tablet by mouth every 6 (six) hours as needed for severe pain. 10/19/20  Yes Gawne, Meghan M, PA-C  loperamide (IMODIUM) 2 MG capsule Take 2 mg by mouth as needed for diarrhea or loose stools.   Yes [provider]  LORazepam (ATIVAN) 1 MG tablet Take 1 tablet (1 mg total) by mouth at bedtime. 09/06/20  Yes Hoyt Koch, MD  meloxicam (MOBIC) 15 MG tablet Take 1 tablet (15 mg total) by mouth daily as needed for pain (and inflammation). 10/19/20  Yes Gawne, Meghan M, PA-C  methocarbamol (ROBAXIN) 500 MG tablet Take 1 tablet (500 mg total) by mouth every 8 (eight) hours as needed for muscle spasms. 10/19/20  Yes Gawne, Meghan M, PA-C  ondansetron (ZOFRAN ODT) 4 MG disintegrating tablet Take 1 tablet (4 mg total) by mouth 2 (two) times daily as needed for nausea or vomiting. 10/19/20  Yes Gawne, Meghan M, PA-C  traMADol-acetaminophen (ULTRACET) 37.5-325 MG tablet Take 1 tablet by mouth 2 (two) times daily as needed. 11/09/20  Yes [provider]  zolpidem (AMBIEN) 5 MG tablet Take 1 tablet (5 mg total) by mouth at bedtime as needed. Patient taking differently: Take 5 mg by mouth at bedtime. 09/06/20  Yes Hoyt Koch, MD  hydrocortisone (ANUSOL-HC) 2.5 % rectal cream Place 1 application rectally 2 (two) times daily. Patient not taking: No sig reported 04/03/18   Hoyt Koch, MD  omeprazole (PRILOSEC OTC) 20 MG tablet Take 1 tablet (20 mg total) by mouth daily. For gastric protection Patient not taking: No sig reported 10/19/20 11/18/20  Britt Bottom, PA-C    Allergies    Resorcinol  Review of Systems   Review of Systems  Constitutional:  Negative for chills and fever.  HENT:  Negative for ear pain and sore throat.   Eyes:  Negative for pain and visual disturbance.   Respiratory:  Negative for cough and shortness of breath.   Cardiovascular:  Negative for chest pain and palpitations.  Gastrointestinal:  Negative for abdominal pain and vomiting.  Genitourinary:  Negative for dysuria and hematuria.  Musculoskeletal:  Positive for neck pain. Negative for arthralgias and back pain.  Skin:  Negative for color change and rash.  Neurological:  Positive for headaches. Negative for seizures and syncope.  All other systems reviewed and are negative.  Physical Exam Updated Vital Signs BP (!) 151/77   Pulse 79   Temp 99.5 F (37.5 C) (Oral)   Resp 12   Ht 5\' 7"  (1.702 m)   Wt 57.6 kg   SpO2 98%   BMI 19.89 kg/m   Physical Exam Vitals and nursing note reviewed.  Constitutional:      General: She is not in acute distress.    Appearance: She is well-developed.     Comments: GCS 15, ABC intact  HENT:     Head: Normocephalic.     Comments: Posterior scalp abrasion present, hemostatic, no clear laceration.  Abrasion to the left cheek. Eyes:     Extraocular Movements: Extraocular movements intact.     Conjunctiva/sclera: Conjunctivae normal.     Pupils: Pupils are equal, round, and reactive to light.  Neck:     Comments: Mild tenderness to palpation of the cervical spine.  C-collar in place Cardiovascular:     Rate and Rhythm: Normal rate and regular rhythm.     Heart sounds: No murmur heard. Pulmonary:     Effort: Pulmonary effort is normal. No respiratory distress.     Breath sounds: Normal breath sounds.  Chest:     Comments: Clavicles stable nontender to AP compression.  Chest wall stable and nontender to AP and lateral compression. Abdominal:     Palpations: Abdomen is soft.     Tenderness: There is no abdominal tenderness.  Musculoskeletal:     Cervical back: Neck supple.  Comments: No midline tenderness to palpation of the thoracic or lumbar spine.  Extremities atraumatic with intact range of motion  Skin:    General: Skin is warm and  dry.  Neurological:     Mental Status: She is alert.     Comments: Cranial nerves II through XII grossly intact.  Moving all 4 extremities spontaneously.  Sensation grossly intact all 4 extremities    ED Results / Procedures / Treatments   Labs (all labs ordered are listed, but only abnormal results are displayed) Labs Reviewed  BASIC METABOLIC PANEL - Abnormal; Notable for the following components:      Result Value   Sodium 134 (*)    Glucose, Bld 105 (*)    Calcium 8.5 (*)    All other components within normal limits  CBC WITH DIFFERENTIAL/PLATELET - Abnormal; Notable for the following components:   RBC 3.51 (*)    Hemoglobin 10.2 (*)    HCT 32.8 (*)    Platelets 141 (*)    All other components within normal limits  RESP PANEL BY RT-PCR (FLU A&B, COVID) ARPGX2  PROTIME-INR  MAGNESIUM  MAGNESIUM  COMPREHENSIVE METABOLIC PANEL  CBC  CBG MONITORING, ED  TROPONIN I (HIGH SENSITIVITY)  TROPONIN I (HIGH SENSITIVITY)    EKG EKG Interpretation  Date/Time:  Friday November 18 2020 12:07:44 EDT Ventricular Rate:  84 PR Interval:    QRS Duration: 74 QT Interval:  366 QTC Calculation: 433 R Axis:   56 Text Interpretation: Accelerated junctional rhythm Low voltage, extremity and precordial leads Confirmed by Regan Lemming ([id number]) on 11/18/2020 12:52:30 PM  Radiology CT HEAD WO CONTRAST (5MM)  Result Date: 11/18/2020 CLINICAL DATA:  Syncopal episode.  Head trauma. EXAM: CT HEAD WITHOUT CONTRAST CT CERVICAL SPINE WITHOUT CONTRAST TECHNIQUE: Multidetector CT imaging of the head and cervical spine was performed following the standard protocol without intravenous contrast. Multiplanar CT image reconstructions of the cervical spine were also generated. COMPARISON:  CT head without contrast 08/18/2015. CT cervical spine without contrast 08/25/2015 FINDINGS: CT HEAD FINDINGS Brain: A thin isodense to slightly hyperdense extra-axial collection is present on the left. This is best seen on  the coronal images. No adjacent fracture is present. No significant mass effect is present. Moderate atrophy is present. Extensive white matter disease is noted. Basal ganglia are intact. None no cortical infarct is present. The brainstem and cerebellum are within normal limits. Vascular: No hyperdense vessel or unexpected calcification. Skull: Calvarium is intact. No focal lytic or blastic lesions are present. A high left parietal scalp laceration and hematoma is present without an underlying fracture. Sinuses/Orbits: The paranasal sinuses and mastoid air cells are clear. The globes and orbits are within normal limits. CT CERVICAL SPINE FINDINGS Alignment: Fused retrolisthesis is present at C5-6 and C6-7. Slight anterolisthesis at C3-4 is fused. Hard collar is in place. Skull base and vertebrae: Craniocervical junction within normal limits. Moderate degenerative changes present at C1-2. C2 lateral mass screws are present bilaterally. Cervical spine is fused posteriorly C2-C6. Wide laminectomy present at C5 and C6. Soft tissues and spinal canal: Soft tissues of the neck are unremarkable. Disc levels: Facet hypertrophy contributes to a multilevel foraminal narrowing at C3-4 fall with C4-5, C5-6, and C6-7. Wide laminectomy present at C5 and C6. Upper chest: The lung apices are clear. IMPRESSION: 1. Thin isodense to slightly hyperdense extra-axial collection on the left consistent with a thin subdural hematoma. No significant mass effect is present. 2. High left parietal scalp laceration and hematoma without  underlying fracture. 3. Multilevel degenerative changes of the cervical spine without acute fracture or traumatic subluxation. 4. Posterior fusion C2-C6 with wide laminectomy at C5 and C6. These results were called by telephone at the time of interpretation on 11/18/2020 at 3:38pm to provider [facility] , who verbally acknowledged these results. Electronically Signed   By: San Morelle M.D.   On:  11/18/2020 15:54   CT Cervical Spine Wo Contrast  Result Date: 11/18/2020 CLINICAL DATA:  Syncopal episode.  Head trauma. EXAM: CT HEAD WITHOUT CONTRAST CT CERVICAL SPINE WITHOUT CONTRAST TECHNIQUE: Multidetector CT imaging of the head and cervical spine was performed following the standard protocol without intravenous contrast. Multiplanar CT image reconstructions of the cervical spine were also generated. COMPARISON:  CT head without contrast 08/18/2015. CT cervical spine without contrast 08/25/2015 FINDINGS: CT HEAD FINDINGS Brain: A thin isodense to slightly hyperdense extra-axial collection is present on the left. This is best seen on the coronal images. No adjacent fracture is present. No significant mass effect is present. Moderate atrophy is present. Extensive white matter disease is noted. Basal ganglia are intact. None no cortical infarct is present. The brainstem and cerebellum are within normal limits. Vascular: No hyperdense vessel or unexpected calcification. Skull: Calvarium is intact. No focal lytic or blastic lesions are present. A high left parietal scalp laceration and hematoma is present without an underlying fracture. Sinuses/Orbits: The paranasal sinuses and mastoid air cells are clear. The globes and orbits are within normal limits. CT CERVICAL SPINE FINDINGS Alignment: Fused retrolisthesis is present at C5-6 and C6-7. Slight anterolisthesis at C3-4 is fused. Hard collar is in place. Skull base and vertebrae: Craniocervical junction within normal limits. Moderate degenerative changes present at C1-2. C2 lateral mass screws are present bilaterally. Cervical spine is fused posteriorly C2-C6. Wide laminectomy present at C5 and C6. Soft tissues and spinal canal: Soft tissues of the neck are unremarkable. Disc levels: Facet hypertrophy contributes to a multilevel foraminal narrowing at C3-4 fall with C4-5, C5-6, and C6-7. Wide laminectomy present at C5 and C6. Upper chest: The lung apices are  clear. IMPRESSION: 1. Thin isodense to slightly hyperdense extra-axial collection on the left consistent with a thin subdural hematoma. No significant mass effect is present. 2. High left parietal scalp laceration and hematoma without underlying fracture. 3. Multilevel degenerative changes of the cervical spine without acute fracture or traumatic subluxation. 4. Posterior fusion C2-C6 with wide laminectomy at C5 and C6. These results were called by telephone at the time of interpretation on 11/18/2020 at 3:38pm to provider [name] , who verbally acknowledged these results. Electronically Signed   By: San Morelle M.D.   On: 11/18/2020 15:54   DG Chest Portable 1 View  Result Date: 11/18/2020 CLINICAL DATA:  Syncope EXAM: PORTABLE CHEST 1 VIEW COMPARISON:  Chest x-ray dated November 17, 2013; CT chest dated July 19, 2015 FINDINGS: Cardiac and mediastinal contours are unchanged. No focal opacity. No evidence of large pleural effusion or pneumothorax. Calcified structure projecting over the left hemithorax, was present on prior 2017 CT and is located in the anterior chest wall soft tissues. Prior posterior fusion of the thoracic spine. Bilateral rib fractures. IMPRESSION: No focal opacity, large pleural effusion or evidence of pneumothorax. Bilateral rib fractures which are likely chronic. Correlate for chest wall tenderness. Calcified structure projecting over the left hemithorax was present on prior 2017 CT and is located in the anterior chest wall soft tissues. Electronically Signed   By: Hosie Poisson.D.  On: 11/18/2020 14:37    Procedures Procedures   Medications Ordered in ED Medications  lidocaine-EPINEPHrine (XYLOCAINE W/EPI) 2 %-1:200000 (PF) injection 10 mL (10 mLs Intradermal Not Given 11/18/20 1603)  acetaminophen (TYLENOL) tablet 650 mg (has no administration in time range)    Or  acetaminophen (TYLENOL) suppository 650 mg (has no administration in time range)  sodium chloride  0.9 % bolus 500 mL (0 mLs Intravenous Stopped 11/18/20 1505)  fentaNYL (SUBLIMAZE) injection 25 mcg (25 mcg Intravenous Given 11/18/20 1453)  acetaminophen (TYLENOL) tablet 650 mg (650 mg Oral Given 11/18/20 1453)  0.9 %  sodium chloride infusion ( Intravenous New Bag/Given 11/18/20 1941)    ED Course  I have reviewed the triage vital signs and the nursing notes.  Pertinent labs & imaging results that were available during my care of the patient were reviewed by me and considered in my medical decision making (see chart for details).    MDM Rules/Calculators/A&P                           80 year old female with past medical history significant for carotid artery disease, osteoarthrosis, presenting to the emergency department after 2 syncopal episodes at home.  The patient states that she had a bout of diarrhea this morning on the toilet.  When she stood up she immediately passed out and struck the back of her head.  Life alert notified EMS and well with EMS she had a second syncopal episode while there helping her to her feet from a sitting position.  The patient did not remember the exact etiology of her syncope.  She endorses sudden onset moderate intensity neck pain, sharp, nonradiating, worse with movement.  She denies any weakness or numbness.  She endorses pain in the back of her head described as dull and throbbing with some bleeding noted.  On arrival, the patient was afebrile, hemodynamically stable, GCS 15, presenting to the emergency department after a syncopal episode at home and head trauma.  Abrasion noted to the scalp.  The patient's tetanus is up-to-date.  The patient has some cervical spinal tenderness and has a history of spinal surgery.  C-collar applied on patient arrival.  The patient presents after 2 syncopal episodes.  Differential diagnosis includes orthostatic hypotension, hypovolemia, vasovagal syncope, cardiac arrhythmia.  EKG obtained which revealed an accelerated junctional  rhythm, no ischemic changes.  Laboratory work-up was collected and pending at time of signout.  CT of the cervical spine was negative for acute fracture or malalignment.  Chest x-ray revealed chronic bilateral rib fractures which are known to the patient.  She has no chest wall tenderness to palpation.  CT of the head did show a small trace subdural hematoma with no midline shift.  Discussed the case with on-call neurosurgery who recommended a period of observation in the emergency department.  Given the patient's syncopal episode and subdural hematoma, plan at time of signout to observe the patient in the emergency department and consider admission due to her age and high risk syncope.  Signout given to Dr. Francia Greaves at [phone].  Final Clinical Impression(s) / ED Diagnoses Final diagnoses:  None    Rx / DC Orders ED Discharge Orders     None        Regan Lemming, MD 11/18/20 2004    Regan Lemming, MD 11/18/20 2006    Regan Lemming, MD 11/18/20 2007

## 2020-11-18 NOTE — ED Notes (Signed)
Visiting with family/ friend at [facility]

## 2020-11-18 NOTE — ED Notes (Signed)
C-collar removed by Dr.Messick

## 2020-11-18 NOTE — ED Notes (Signed)
Alert, NAD, calm, interactive, speech clear, resps e/u, [place] c-collar remains in place, lying with HOB 45 degrees, denies pain while still, reports neck pain with movement, visiting with family/ friend at [facility].

## 2020-11-18 NOTE — ED Triage Notes (Signed)
Patient presents to ed via [facility] states she go up this am to go to the bathroom had diarrhea and had a syncopal episode. Upon FD arrival patient was alert  and oriented however doesn't remember how she go on the floor.

## 2020-11-19 ENCOUNTER — Inpatient Hospital Stay (HOSPITAL_COMMUNITY): Payer: Medicare Other

## 2020-11-19 ENCOUNTER — Other Ambulatory Visit (HOSPITAL_COMMUNITY): Payer: Medicare Other

## 2020-11-19 ENCOUNTER — Observation Stay (HOSPITAL_COMMUNITY): Payer: Medicare Other

## 2020-11-19 DIAGNOSIS — Z9181 History of falling: Secondary | ICD-10-CM | POA: Diagnosis not present

## 2020-11-19 DIAGNOSIS — N3010 Interstitial cystitis (chronic) without hematuria: Secondary | ICD-10-CM | POA: Diagnosis present

## 2020-11-19 DIAGNOSIS — R509 Fever, unspecified: Secondary | ICD-10-CM | POA: Diagnosis not present

## 2020-11-19 DIAGNOSIS — Z681 Body mass index (BMI) 19 or less, adult: Secondary | ICD-10-CM | POA: Diagnosis not present

## 2020-11-19 DIAGNOSIS — I951 Orthostatic hypotension: Secondary | ICD-10-CM | POA: Diagnosis present

## 2020-11-19 DIAGNOSIS — N19 Unspecified kidney failure: Secondary | ICD-10-CM | POA: Diagnosis not present

## 2020-11-19 DIAGNOSIS — S2243XS Multiple fractures of ribs, bilateral, sequela: Secondary | ICD-10-CM | POA: Diagnosis not present

## 2020-11-19 DIAGNOSIS — I110 Hypertensive heart disease with heart failure: Secondary | ICD-10-CM | POA: Diagnosis present

## 2020-11-19 DIAGNOSIS — R55 Syncope and collapse: Secondary | ICD-10-CM

## 2020-11-19 DIAGNOSIS — W1839XA Other fall on same level, initial encounter: Secondary | ICD-10-CM | POA: Diagnosis present

## 2020-11-19 DIAGNOSIS — R64 Cachexia: Secondary | ICD-10-CM | POA: Diagnosis present

## 2020-11-19 DIAGNOSIS — I5032 Chronic diastolic (congestive) heart failure: Secondary | ICD-10-CM | POA: Diagnosis present

## 2020-11-19 DIAGNOSIS — I509 Heart failure, unspecified: Secondary | ICD-10-CM | POA: Diagnosis not present

## 2020-11-19 DIAGNOSIS — E860 Dehydration: Secondary | ICD-10-CM | POA: Diagnosis present

## 2020-11-19 DIAGNOSIS — G8929 Other chronic pain: Secondary | ICD-10-CM | POA: Diagnosis present

## 2020-11-19 DIAGNOSIS — Z23 Encounter for immunization: Secondary | ICD-10-CM | POA: Diagnosis not present

## 2020-11-19 DIAGNOSIS — F32A Depression, unspecified: Secondary | ICD-10-CM | POA: Diagnosis present

## 2020-11-19 DIAGNOSIS — Y92002 Bathroom of unspecified non-institutional (private) residence single-family (private) house as the place of occurrence of the external cause: Secondary | ICD-10-CM | POA: Diagnosis not present

## 2020-11-19 DIAGNOSIS — K582 Mixed irritable bowel syndrome: Secondary | ICD-10-CM | POA: Diagnosis present

## 2020-11-19 DIAGNOSIS — I10 Essential (primary) hypertension: Secondary | ICD-10-CM | POA: Diagnosis not present

## 2020-11-19 DIAGNOSIS — R6510 Systemic inflammatory response syndrome (SIRS) of non-infectious origin without acute organ dysfunction: Secondary | ICD-10-CM | POA: Diagnosis present

## 2020-11-19 DIAGNOSIS — E785 Hyperlipidemia, unspecified: Secondary | ICD-10-CM | POA: Diagnosis present

## 2020-11-19 DIAGNOSIS — E440 Moderate protein-calorie malnutrition: Secondary | ICD-10-CM | POA: Diagnosis present

## 2020-11-19 DIAGNOSIS — J3089 Other allergic rhinitis: Secondary | ICD-10-CM | POA: Diagnosis present

## 2020-11-19 DIAGNOSIS — D638 Anemia in other chronic diseases classified elsewhere: Secondary | ICD-10-CM | POA: Diagnosis present

## 2020-11-19 DIAGNOSIS — I6523 Occlusion and stenosis of bilateral carotid arteries: Secondary | ICD-10-CM | POA: Diagnosis present

## 2020-11-19 DIAGNOSIS — Z20822 Contact with and (suspected) exposure to covid-19: Secondary | ICD-10-CM | POA: Diagnosis present

## 2020-11-19 DIAGNOSIS — G4700 Insomnia, unspecified: Secondary | ICD-10-CM | POA: Diagnosis present

## 2020-11-19 DIAGNOSIS — E876 Hypokalemia: Secondary | ICD-10-CM | POA: Diagnosis present

## 2020-11-19 DIAGNOSIS — I6529 Occlusion and stenosis of unspecified carotid artery: Secondary | ICD-10-CM | POA: Diagnosis present

## 2020-11-19 DIAGNOSIS — S065XAA Traumatic subdural hemorrhage with loss of consciousness status unknown, initial encounter: Secondary | ICD-10-CM | POA: Diagnosis not present

## 2020-11-19 DIAGNOSIS — E222 Syndrome of inappropriate secretion of antidiuretic hormone: Secondary | ICD-10-CM | POA: Diagnosis present

## 2020-11-19 LAB — COMPREHENSIVE METABOLIC PANEL
ALT: 14 U/L (ref 0–44)
AST: 23 U/L (ref 15–41)
Albumin: 3.5 g/dL (ref 3.5–5.0)
Alkaline Phosphatase: 41 U/L (ref 38–126)
Anion gap: 12 (ref 5–15)
BUN: 12 mg/dL (ref 8–23)
CO2: 23 mmol/L (ref 22–32)
Calcium: 8.7 mg/dL — ABNORMAL LOW (ref 8.9–10.3)
Chloride: 99 mmol/L (ref 98–111)
Creatinine, Ser: 0.78 mg/dL (ref 0.44–1.00)
GFR, Estimated: >60 mL/min (ref >60)
Glucose, Bld: 133 mg/dL — ABNORMAL HIGH (ref 70–99)
Potassium: 3.6 mmol/L (ref 3.5–5.1)
Sodium: 134 mmol/L — ABNORMAL LOW (ref 135–145)
Total Bilirubin: 0.7 mg/dL (ref 0.3–1.2)
Total Protein: 6.1 g/dL — ABNORMAL LOW (ref 6.5–8.1)

## 2020-11-19 LAB — CORTISOL: Cortisol, Plasma: 9.4 ug/dL

## 2020-11-19 LAB — CBC
HCT: 34.4 % — ABNORMAL LOW (ref 36.0–46.0)
Hemoglobin: 10.9 g/dL — ABNORMAL LOW (ref 12.0–15.0)
MCH: 28.8 pg (ref 26.0–34.0)
MCHC: 31.7 g/dL (ref 30.0–36.0)
MCV: 90.8 fL (ref 80.0–100.0)
Platelets: 142 10*3/uL — ABNORMAL LOW (ref 150–400)
RBC: 3.79 MIL/uL — ABNORMAL LOW (ref 3.87–5.11)
RDW: 14.4 % (ref 11.5–15.5)
WBC: 7.6 10*3/uL (ref 4.0–10.5)
nRBC: 0 % (ref 0.0–0.2)

## 2020-11-19 LAB — PROTIME-INR
INR: 1.1 (ref 0.8–1.2)
Prothrombin Time: 13.9 seconds (ref 11.4–15.2)

## 2020-11-19 LAB — ECHOCARDIOGRAM LIMITED
Area-P 1/2: 4.21 cm2
Height: 67 in
Weight: 2032 oz

## 2020-11-19 LAB — TSH: TSH: 1.643 u[IU]/mL (ref 0.350–4.500)

## 2020-11-19 LAB — OSMOLALITY: Osmolality: 275 mOsm/kg (ref 275–295)

## 2020-11-19 LAB — MAGNESIUM: Magnesium: 1.9 mg/dL (ref 1.7–2.4)

## 2020-11-19 LAB — TROPONIN I (HIGH SENSITIVITY): Troponin I (High Sensitivity): 7 ng/L (ref <18)

## 2020-11-19 LAB — T4, FREE: Free T4: 0.87 ng/dL (ref 0.61–1.12)

## 2020-11-19 MED ORDER — DIPHENHYDRAMINE HCL 25 MG PO CAPS
25.0000 mg | ORAL_CAPSULE | Freq: Four times a day (QID) | ORAL | Status: AC | PRN
Start: 2020-11-20 — End: 2020-11-22
  Administered 2020-11-20 – 2020-11-22 (×3): 25 mg via ORAL
  Filled 2020-11-19 (×3): qty 1

## 2020-11-19 MED ORDER — SODIUM CHLORIDE 0.9 % IV SOLN: INTRAVENOUS | Status: DC

## 2020-11-19 MED ORDER — VANCOMYCIN HCL IN DEXTROSE 1-5 GM/200ML-% IV SOLN
1000.0000 mg | INTRAVENOUS | Status: DC
  Administered 2020-11-19 – 2020-11-20 (×2): 1000 mg via INTRAVENOUS
  Filled 2020-11-19 (×2): qty 200

## 2020-11-19 MED ORDER — INFLUENZA VAC A&B SA ADJ QUAD 0.5 ML IM PRSY
0.5000 mL | PREFILLED_SYRINGE | INTRAMUSCULAR | Status: AC
  Administered 2020-11-23: 0.5 mL via INTRAMUSCULAR
  Filled 2020-11-19: qty 0.5

## 2020-11-19 MED ORDER — LIP MEDEX EX OINT
1.0000 "application " | TOPICAL_OINTMENT | CUTANEOUS | Status: DC | PRN
  Administered 2020-11-19: 1 via TOPICAL
  Filled 2020-11-19: qty 7

## 2020-11-19 MED ORDER — DIPHENHYDRAMINE HCL 50 MG/ML IJ SOLN
25.0000 mg | Freq: Once | INTRAMUSCULAR | Status: AC
  Administered 2020-11-19: 25 mg via INTRAVENOUS
  Filled 2020-11-19: qty 1

## 2020-11-19 MED ORDER — SODIUM CHLORIDE 0.9 % IV SOLN
2.0000 g | Freq: Two times a day (BID) | INTRAVENOUS | Status: DC
  Administered 2020-11-19 – 2020-11-21 (×4): 2 g via INTRAVENOUS
  Filled 2020-11-19 (×4): qty 2

## 2020-11-19 MED ORDER — VANCOMYCIN HCL 1250 MG/250ML IV SOLN
1250.0000 mg | Freq: Once | INTRAVENOUS | Status: AC
  Administered 2020-11-19: 1250 mg via INTRAVENOUS
  Filled 2020-11-19: qty 250

## 2020-11-19 NOTE — ED Notes (Signed)
MD made aware of bp drop

## 2020-11-19 NOTE — Evaluation (Signed)
Physical Therapy Evaluation Patient Details Name: Hailey [name] MRN: [id number] DOB: Mar 13, 1940 Today's Date: 11/19/2020  History of Present Illness  Pt is an 80 y.o. female who presented 11/18/20 with syncope episode. Imaging revealed thin left SDH. 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 PMH: chronic diastolic heart failure, hyperlipidemia, subarachnoid hemorrhage in December 2015   Clinical Impression  Pt presents with condition above and deficits mentioned below, see PT Problem List. PTA, she was living in a one-level house with 2 STE with rails and being provided 24/7 assistance as needed by hired caregivers. PTA, she was mod I for mobility with her quad-cane. Currently, pt was limited to bed level mobility due to displaying symptomatic orthostatic hypotension, RN made aware. Pt is requiring minA for bed mobility at this time. She is displaying gross weakness. She has a hx of recent falls also. Pt would benefit from further acute PT and follow-up with HHPT to maximize her safety and independence with all functional mobility and reduce her risk for falls.     Recommendations for follow up therapy are one component of a multi-disciplinary discharge planning process, led by the attending physician.  Recommendations may be updated based on patient status, additional functional criteria and insurance authorization.  Follow Up Recommendations Home health PT;Supervision for mobility/OOB    Equipment Recommendations  None recommended by PT    Recommendations for Other Services       Precautions / Restrictions Precautions Precautions: Fall;Other (comment) Precaution Comments: orthostatic hypotension Restrictions Weight Bearing Restrictions: No      Mobility  Bed Mobility Overal bed mobility: Needs Assistance Bed Mobility: Rolling;Sidelying to Sit;Sit to Supine Rolling: Min guard Sidelying to sit: Min assist;HOB elevated   Sit to supine: Min  assist;HOB elevated   General bed mobility comments: Cued pt to log roll to manage neck pain. MinA to ascend trunk from elevated HOB. MinA to manage trunk and legs back to supine.    Transfers                 General transfer comment: Deferred as pt displayed symptomatic orthostatic hypotension upon sitting.  Ambulation/Gait             General Gait Details: Deferred as pt displayed symptomatic orthostatic hypotension upon sitting.  Stairs            Wheelchair Mobility    Modified Rankin (Stroke Patients Only) Modified Rankin (Stroke Patients Only) Pre-Morbid Rankin Score: Moderate disability Modified Rankin: Moderately severe disability     Balance Overall balance assessment: Needs assistance Sitting-balance support: No upper extremity supported;Feet supported Sitting balance-Leahy Scale: Fair Sitting balance - Comments: Static sitting EOB with supervision for safety.       Standing balance comment: Deferred as pt displayed symptomatic orthostatic hypotension upon sitting.                             Pertinent Vitals/Pain Pain Assessment: 0-10 Pain Score: 2  Pain Location: neck, L shoulder Pain Descriptors / Indicators: Discomfort;Grimacing;Guarding Pain Intervention(s): Limited activity within patient's tolerance;Monitored during session;Premedicated before session;Repositioned    Home Living Family/patient expects to be discharged to:: Private residence Living Arrangements: Non-relatives/Friends;Children Available Help at Discharge: Personal care attendant;Available 24 hours/day;Family;Friend(s) Type of Home: House Home Access: Stairs to enter Entrance Stairs-Rails: Can reach both Entrance Stairs-Number of Steps: 2 Home Layout: One level Home Equipment: Walker - 2 wheels;Bedside commode;Cane - single point;Toilet riser;Cane -  quad;Shower seat;Grab bars - tub/shower;Grab bars - toilet;Transport chair Additional Comments: Pt has a  caregiver staying 24/7    Prior Function Level of Independence: Needs assistance   Gait / Transfers Assistance Needed: Has been using a quad-cane most recently, can do several laps around the house mod I before fatigues  ADL's / Homemaking Assistance Needed: caregivers do cleaning. Pt can do some cooking. Dresses and bathes herself in sitting mod I.  Comments: Drives herself. Pt has a hx of falls, even recently since her hip surgery 10/18/20.     Hand Dominance   Dominant Hand: Right    Extremity/Trunk Assessment   Upper Extremity Assessment Upper Extremity Assessment: Generalized weakness    Lower Extremity Assessment Lower Extremity Assessment: Generalized weakness    Cervical / Trunk Assessment Cervical / Trunk Assessment: Other exceptions Cervical / Trunk Exceptions: neck pain causing stiff movements  Communication   Communication: No difficulties  Cognition Arousal/Alertness: Awake/alert Behavior During Therapy: WFL for tasks assessed/performed Overall Cognitive Status: Within Functional Limits for tasks assessed                                 General Comments: A&Ox4.      General Comments General comments (skin integrity, edema, etc.): BP: 96/52 supine, 62/54 sitting (lightheadedness noted) thus deferred OOB mobility, 90/59 once supine again, RN made aware    Exercises     Assessment/Plan    PT Assessment Patient needs continued PT services  PT Problem List Decreased strength;Decreased activity tolerance;Decreased balance;Decreased range of motion;Decreased mobility;Decreased coordination;Cardiopulmonary status limiting activity       PT Treatment Interventions DME instruction;Gait training;Functional mobility training;Stair training;Therapeutic activities;Therapeutic exercise;Balance training;Neuromuscular re-education;Patient/family education    PT Goals (Current goals can be found in the Care Plan section)  Acute Rehab PT Goals Patient  Stated Goal: to go home to her cat PT Goal Formulation: With patient Time For Goal Achievement: 12/03/20 Potential to Achieve Goals: Good    Frequency Min 3X/week   Barriers to discharge        Co-evaluation               AM-PAC PT "6 Clicks" Mobility  Outcome Measure Help needed turning from your back to your side while in a flat bed without using bedrails?: A Little Help needed moving from lying on your back to sitting on the side of a flat bed without using bedrails?: A Little Help needed moving to and from a bed to a chair (including a wheelchair)?: A Little Help needed standing up from a chair using your arms (e.g., wheelchair or bedside chair)?: A Little Help needed to walk in hospital room?: A Little Help needed climbing 3-5 steps with a railing? : A Little 6 Click Score: 18    End of Session Equipment Utilized During Treatment: Gait belt Activity Tolerance: Treatment limited secondary to medical complications (Comment) (symptomatic orthostatic hypotension) Patient left: in bed;with call bell/phone within reach;with bed alarm set;with family/visitor present Nurse Communication: Mobility status;Other (comment) (symptomatic orthostatic hypotension) PT Visit Diagnosis: Muscle weakness (generalized) (M62.81);History of falling (Z91.81);Difficulty in walking, not elsewhere classified (R26.2);Dizziness and giddiness (R42)    Time: 4944-9675 PT Time Calculation (min) (ACUTE ONLY): 27 min   Charges:   PT Evaluation $PT Eval Moderate Complexity: 1 Mod PT Treatments $Therapeutic Activity: 8-22 mins        Moishe Spice, PT, DPT Acute Rehabilitation Services  Pager: [phone] Office: [phone]  Maretta Bees Pettis 11/19/2020, 2:49 PM

## 2020-11-19 NOTE — Progress Notes (Signed)
  Echocardiogram 2D Echocardiogram has been performed.  Hailey [name] 11/19/2020, 10:51 AM

## 2020-11-19 NOTE — Progress Notes (Signed)
Pharmacy Antibiotic Note  Hailey [name] is a 80 y.o. female here with syncope and subdural hematoma  now with  possible pneumonia.  Pharmacy has been consulted for cefepime and vancomycin  dosing. -WBC= 7.6, tmax= 102.7 -CrCl ~ 50 -cultures ordered  Plan: Cefepime 2gm IV q12h Vancomycin 1250mg  IV x1 followed by 1000mg  IV q24h (estimated AUC= 490) Will follow renal function, cultures and clinical progress'   Height: 5\' 7"  (170.2 cm) Weight: 57.6 kg (127 lb) IBW/kg (Calculated) : 61.6  Temp (24hrs), Avg:99.7 F (37.6 C), Min:97.6 F (36.4 C), Max:102.7 F (39.3 C)  Recent Labs  Lab 11/18/20 1201 11/19/20 0320  WBC 6.3 7.6  CREATININE 0.73 0.78    Estimated Creatinine Clearance: 51 mL/min (by C-G formula based on SCr of 0.78 mg/dL).    Allergies  Allergen Reactions   Resorcinol Swelling and Other (See Comments)    blisters     Thank you for allowing pharmacy to be a part of this patient's care.  Hildred Laser, PharmD Clinical Pharmacist **Pharmacist phone directory can now be found on [email or web address] ([email or web address]).  Listed under [place].

## 2020-11-19 NOTE — Progress Notes (Signed)
PROGRESS NOTE    ELVIN BANKER  GNO:[id number] DOB: [birth year] DOA: 11/18/2020 PCP: Hoyt Koch, MD  Outpatient Specialists:   Brief Narrative:  Patient is an 80 year old Caucasian female with past medical history significant for chronic diastolic heart failure, hyperlipidemia and subarachnoid hemorrhage.  Patient is mildly cachectic with BMI of 19.89 kg per metered square.  Patient was admitted with syncope/fall at home.  Apparently, patient endorsed bouts of diarrhea prior to the syncope.  Patient is significantly orthostatic.  Patient is currently being worked up.  Temperature of 102.7 has been documented.  Patient has dryness of the tongue, but reports that this has been chronic.  Duration of unconsciousness following the syncope and fall is unknown.  Significant lab findings include sodium of 134, stable troponin, normal WBC but with left shift, hemoglobin of 10.2 to 10.9 g/dL (hemoglobin was 12.4 g/dL about 6 weeks ago).  CT head without contrast revealed thin subdural hematoma without mass-effect.  Assessment & Plan:   Principal Problem:   Syncope Active Problems:   HLD (hyperlipidemia)   Seasonal and perennial allergic rhinitis   INSOMNIA   Closed rib fracture   Subdural hematoma   Acute prerenal azotemia   Chronic diastolic CHF (congestive heart failure) (HCC)     Fever/Syncope/Orthostatic Hypotension/SIRS: -Work-up is in progress. -We will panculture the patient. -We will continue IV fluids. -We will start patient on broad-spectrum antibiotics. -Continue to monitor orthostasis. -Telemetry monitoring. -Further management depend on hospital course.   Volume depletion: -IV fluids as above. -Check urine sodium.  Hyponatremia: -Check urine sodium, urine and plasma osmolality. -Check TSH. -Check cortisol level. -Continue IV fluids for now. -Last sodium was 134.  Subdural hematoma: -Said to be obtained. -Neurosurgery is aware of the case. -Continue to  monitor closely.    Low voltage EKG: -Echocardiogram is negative for pericardial effusion. -Check TSH and free T4.  Chronic diastolic CHF: -Compensated. -Continue to monitor symptoms while on IV fluids.  Cachexia: -BNP of 19.89 kg per metered square. -Dietary consult. -Caloric count. -Follow results of TSH and free T4.   Chronic bilateral rib fractures: Allergic rhinitis. Depression. Hyperlipidemia  DVT prophylaxis: SCD. Code Status: Full code Family Communication:  Disposition Plan: This will depend on hospital course   Consultants:  Neurosurgery is aware of patient  Procedures:  None  Antimicrobials:  We will start patient on IV Vanco/cefepime as per pharmacy   Subjective: Fever 102.7 F noted. Syncope Orthostasis  Objective: Vitals:   11/19/20 1415 11/19/20 1500 11/19/20 1600 11/19/20 1638  BP: (!) 96/52 (!) 98/48 (!) 95/54   Pulse: 71 79 68   Resp: 15 16 20    Temp:    97.6 F (36.4 C)  TempSrc:    Oral  SpO2: 92% 94%    Weight:      Height:       No intake or output data in the 24 hours ending 11/19/20 1823 Filed Weights   11/18/20 1225  Weight: 57.6 kg    Examination:  General exam: Appears calm and comfortable.  Patient is thin/cachectic Respiratory system: Clear to auscultation.  Cardiovascular system: S1 & S2  Gastrointestinal system: Abdomen is nondistended, soft and nontender. No organomegaly or masses felt. Normal bowel sounds heard. Central nervous system: Awake and alert.  Patient moves all extremities.  Extremities: No leg edema. Data Reviewed: I have personally reviewed following labs and imaging studies  CBC: Recent Labs  Lab 11/18/20 1201 11/19/20 0320  WBC 6.3 7.6  NEUTROABS 5.1  --  HGB 10.2* 10.9*  HCT 32.8* 34.4*  MCV 93.4 90.8  PLT 141* 092*   Basic Metabolic Panel: Recent Labs  Lab 11/18/20 1201 11/19/20 0320  NA 134* 134*  K 4.1 3.6  CL 101 99  CO2 24 23  GLUCOSE 105* 133*  BUN 17 12  CREATININE  0.73 0.78  CALCIUM 8.5* 8.7*  MG  --  1.9   GFR: Estimated Creatinine Clearance: 51 mL/min (by C-G formula based on SCr of 0.78 mg/dL). Liver Function Tests: Recent Labs  Lab 11/19/20 0320  AST 23  ALT 14  ALKPHOS 41  BILITOT 0.7  PROT 6.1*  ALBUMIN 3.5   No results for input(s): LIPASE, AMYLASE in the last 168 hours. No results for input(s): AMMONIA in the last 168 hours. Coagulation Profile: Recent Labs  Lab 11/18/20 1201 11/19/20 0320  INR 1.0 1.1   Cardiac Enzymes: No results for input(s): CKTOTAL, CKMB, CKMBINDEX, TROPONINI in the last 168 hours. BNP (last 3 results) No results for input(s): PROBNP in the last 8760 hours. HbA1C: No results for input(s): HGBA1C in the last 72 hours. CBG: Recent Labs  Lab 11/18/20 1900  GLUCAP 99   Lipid Profile: No results for input(s): CHOL, HDL, LDLCALC, TRIG, CHOLHDL, LDLDIRECT in the last 72 hours. Thyroid Function Tests: No results for input(s): TSH, T4TOTAL, FREET4, T3FREE, THYROIDAB in the last 72 hours. Anemia Panel: No results for input(s): VITAMINB12, FOLATE, FERRITIN, TIBC, IRON, RETICCTPCT in the last 72 hours. Urine analysis:    Component Value Date/Time   COLORURINE YELLOW 08/03/2014 0509   APPEARANCEUR CLEAR 08/03/2014 0509   LABSPEC 1.016 08/03/2014 0509   PHURINE 6.0 08/03/2014 0509   GLUCOSEU NEGATIVE 08/03/2014 0509   HGBUR NEGATIVE 08/03/2014 0509   HGBUR large 06/07/2008 1455   BILIRUBINUR NEGATIVE 08/03/2014 0509   KETONESUR NEGATIVE 08/03/2014 0509   PROTEINUR NEGATIVE 08/03/2014 0509   UROBILINOGEN 0.2 08/03/2014 0509   NITRITE NEGATIVE 08/03/2014 0509   LEUKOCYTESUR TRACE (A) 08/03/2014 0509   Sepsis Labs: @LABRCNTIP (procalcitonin:4,lacticidven:4)  ) Recent Results (from the past 240 hour(s))  Resp Panel by RT-PCR (Flu A&B, Covid) Nasopharyngeal Swab     Status: None   Collection Time: 11/18/20  7:40 PM   Specimen: Nasopharyngeal Swab; Nasopharyngeal(NP) swabs in vial transport medium   Result Value Ref Range Status   SARS Coronavirus 2 by RT PCR NEGATIVE NEGATIVE Final    Comment: (NOTE) SARS-CoV-2 target nucleic acids are NOT DETECTED.  The SARS-CoV-2 RNA is generally detectable in upper respiratory specimens during the acute phase of infection. The lowest concentration of SARS-CoV-2 viral copies this assay can detect is 138 copies/mL. A negative result does not preclude SARS-Cov-2 infection and should not be used as the sole basis for treatment or other patient management decisions. A negative result may occur with  improper specimen collection/handling, submission of specimen other than nasopharyngeal swab, presence of viral mutation(s) within the areas targeted by this assay, and inadequate number of viral copies(<138 copies/mL). A negative result must be combined with clinical observations, patient history, and epidemiological information. The expected result is Negative.  Fact Sheet for Patients:  EntrepreneurPulse.com.au  Fact Sheet for Healthcare Providers:  IncredibleEmployment.be  This test is no t yet approved or cleared by the [place] FDA and  has been authorized for detection and/or diagnosis of SARS-CoV-2 by FDA under an Emergency Use Authorization (EUA). This EUA will remain  in effect (meaning this test can be used) for the duration of the COVID-19 declaration under Section 564(b)(1)  of the Act, 21 U.S.C.section 360bbb-3(b)(1), unless the authorization is terminated  or revoked sooner.       Influenza A by PCR NEGATIVE NEGATIVE Final   Influenza B by PCR NEGATIVE NEGATIVE Final    Comment: (NOTE) The Xpert Xpress SARS-CoV-2/FLU/RSV plus assay is intended as an aid in the diagnosis of influenza from Nasopharyngeal swab specimens and should not be used as a sole basis for treatment. Nasal washings and aspirates are unacceptable for Xpert Xpress SARS-CoV-2/FLU/RSV testing.  Fact Sheet for  Patients: EntrepreneurPulse.com.au  Fact Sheet for Healthcare Providers: IncredibleEmployment.be  This test is not yet approved or cleared by the [place] FDA and has been authorized for detection and/or diagnosis of SARS-CoV-2 by FDA under an Emergency Use Authorization (EUA). This EUA will remain in effect (meaning this test can be used) for the duration of the COVID-19 declaration under Section 564(b)(1) of the Act, 21 U.S.C. section 360bbb-3(b)(1), unless the authorization is terminated or revoked.  Performed at [facility], [place] [address], [place], [place] [address]          Radiology Studies: CT HEAD WO CONTRAST (5MM)  Result Date: 11/18/2020 CLINICAL DATA:  Syncopal episode.  Head trauma. EXAM: CT HEAD WITHOUT CONTRAST CT CERVICAL SPINE WITHOUT CONTRAST TECHNIQUE: Multidetector CT imaging of the head and cervical spine was performed following the standard protocol without intravenous contrast. Multiplanar CT image reconstructions of the cervical spine were also generated. COMPARISON:  CT head without contrast 08/18/2015. CT cervical spine without contrast 08/25/2015 FINDINGS: CT HEAD FINDINGS Brain: A thin isodense to slightly hyperdense extra-axial collection is present on the left. This is best seen on the coronal images. No adjacent fracture is present. No significant mass effect is present. Moderate atrophy is present. Extensive white matter disease is noted. Basal ganglia are intact. None no cortical infarct is present. The brainstem and cerebellum are within normal limits. Vascular: No hyperdense vessel or unexpected calcification. Skull: Calvarium is intact. No focal lytic or blastic lesions are present. A high left parietal scalp laceration and hematoma is present without an underlying fracture. Sinuses/Orbits: The paranasal sinuses and mastoid air cells are clear. The globes and orbits are within normal limits. CT CERVICAL SPINE  FINDINGS Alignment: Fused retrolisthesis is present at C5-6 and C6-7. Slight anterolisthesis at C3-4 is fused. Hard collar is in place. Skull base and vertebrae: Craniocervical junction within normal limits. Moderate degenerative changes present at C1-2. C2 lateral mass screws are present bilaterally. Cervical spine is fused posteriorly C2-C6. Wide laminectomy present at C5 and C6. Soft tissues and spinal canal: Soft tissues of the neck are unremarkable. Disc levels: Facet hypertrophy contributes to a multilevel foraminal narrowing at C3-4 fall with C4-5, C5-6, and C6-7. Wide laminectomy present at C5 and C6. Upper chest: The lung apices are clear. IMPRESSION: 1. Thin isodense to slightly hyperdense extra-axial collection on the left consistent with a thin subdural hematoma. No significant mass effect is present. 2. High left parietal scalp laceration and hematoma without underlying fracture. 3. Multilevel degenerative changes of the cervical spine without acute fracture or traumatic subluxation. 4. Posterior fusion C2-C6 with wide laminectomy at C5 and C6. These results were called by telephone at the time of interpretation on 11/18/2020 at 3:38pm to provider [facility] , who verbally acknowledged these results. Electronically Signed   By: San Morelle M.D.   On: 11/18/2020 15:54   CT Cervical Spine Wo Contrast  Result Date: 11/18/2020 CLINICAL DATA:  Syncopal episode.  Head trauma. EXAM:  CT HEAD WITHOUT CONTRAST CT CERVICAL SPINE WITHOUT CONTRAST TECHNIQUE: Multidetector CT imaging of the head and cervical spine was performed following the standard protocol without intravenous contrast. Multiplanar CT image reconstructions of the cervical spine were also generated. COMPARISON:  CT head without contrast 08/18/2015. CT cervical spine without contrast 08/25/2015 FINDINGS: CT HEAD FINDINGS Brain: A thin isodense to slightly hyperdense extra-axial collection is present on the left. This is best seen on  the coronal images. No adjacent fracture is present. No significant mass effect is present. Moderate atrophy is present. Extensive white matter disease is noted. Basal ganglia are intact. None no cortical infarct is present. The brainstem and cerebellum are within normal limits. Vascular: No hyperdense vessel or unexpected calcification. Skull: Calvarium is intact. No focal lytic or blastic lesions are present. A high left parietal scalp laceration and hematoma is present without an underlying fracture. Sinuses/Orbits: The paranasal sinuses and mastoid air cells are clear. The globes and orbits are within normal limits. CT CERVICAL SPINE FINDINGS Alignment: Fused retrolisthesis is present at C5-6 and C6-7. Slight anterolisthesis at C3-4 is fused. Hard collar is in place. Skull base and vertebrae: Craniocervical junction within normal limits. Moderate degenerative changes present at C1-2. C2 lateral mass screws are present bilaterally. Cervical spine is fused posteriorly C2-C6. Wide laminectomy present at C5 and C6. Soft tissues and spinal canal: Soft tissues of the neck are unremarkable. Disc levels: Facet hypertrophy contributes to a multilevel foraminal narrowing at C3-4 fall with C4-5, C5-6, and C6-7. Wide laminectomy present at C5 and C6. Upper chest: The lung apices are clear. IMPRESSION: 1. Thin isodense to slightly hyperdense extra-axial collection on the left consistent with a thin subdural hematoma. No significant mass effect is present. 2. High left parietal scalp laceration and hematoma without underlying fracture. 3. Multilevel degenerative changes of the cervical spine without acute fracture or traumatic subluxation. 4. Posterior fusion C2-C6 with wide laminectomy at C5 and C6. These results were called by telephone at the time of interpretation on 11/18/2020 at 3:38pm to provider [facility] , who verbally acknowledged these results. Electronically Signed   By: San Morelle M.D.   On:  11/18/2020 15:54   DG Chest Portable 1 View  Result Date: 11/18/2020 CLINICAL DATA:  Syncope EXAM: PORTABLE CHEST 1 VIEW COMPARISON:  Chest x-ray dated November 17, 2013; CT chest dated July 19, 2015 FINDINGS: Cardiac and mediastinal contours are unchanged. No focal opacity. No evidence of large pleural effusion or pneumothorax. Calcified structure projecting over the left hemithorax, was present on prior 2017 CT and is located in the anterior chest wall soft tissues. Prior posterior fusion of the thoracic spine. Bilateral rib fractures. IMPRESSION: No focal opacity, large pleural effusion or evidence of pneumothorax. Bilateral rib fractures which are likely chronic. Correlate for chest wall tenderness. Calcified structure projecting over the left hemithorax was present on prior 2017 CT and is located in the anterior chest wall soft tissues. Electronically Signed   By: Yetta Glassman M.D.   On: 11/18/2020 14:37   ECHOCARDIOGRAM LIMITED  Result Date: 11/19/2020    ECHOCARDIOGRAM REPORT   Patient Name:   TAURI ETHINGTON Date of Exam: 11/19/2020 Medical Rec #:  [id number]     Height:       67.0 in Accession #:    [id number]    Weight:       127.0 lb Date of Birth:  1940/12/08     BSA:          1.667 m  Patient Age:    56 years      BP:           115/77 mmHg Patient Gender: F             HR:           80 bpm. Exam Location:  Inpatient Procedure: Limited Echo, Limited Color Doppler and Cardiac Doppler Indications:    syncope  History:        Patient has prior history of Echocardiogram examinations, most                 recent 12/23/2014. Risk Factors:Dyslipidemia.  Sonographer:    Johny Chess RDCS Referring Phys: [id number] Rhetta Mura  Sonographer Comments: Technically difficult study due to poor echo windows, Technically challenging study due to limited acoustic windows, no parasternal window and no apical window. Image acquisition challenging due to respiratory motion. IMPRESSIONS  1. Left ventricular  ejection fraction, by estimation, is >75%. The left ventricle has hyperdynamic function. The left ventricle has no regional wall motion abnormalities. Left ventricular diastolic parameters are consistent with Grade I diastolic dysfunction (impaired relaxation).  2. Right ventricular systolic function is normal. The right ventricular size is normal.  3. The mitral valve is normal in structure. Trivial mitral valve regurgitation. No evidence of mitral stenosis. Moderate mitral annular calcification.  4. The aortic valve is normal in structure. Aortic valve regurgitation is trivial. No aortic stenosis is present.  5. The inferior vena cava is normal in size with greater than 50% respiratory variability, suggesting right atrial pressure of 3 mmHg. FINDINGS  Left Ventricle: Left ventricular ejection fraction, by estimation, is >75%. The left ventricle has hyperdynamic function. The left ventricle has no regional wall motion abnormalities. The left ventricular internal cavity size was normal in size. There is no left ventricular hypertrophy. Left ventricular diastolic parameters are consistent with Grade I diastolic dysfunction (impaired relaxation). Right Ventricle: The right ventricular size is normal. No increase in right ventricular wall thickness. Right ventricular systolic function is normal. Left Atrium: Left atrial size was normal in size. Right Atrium: Right atrial size was normal in size. Pericardium: There is no evidence of pericardial effusion. Mitral Valve: The mitral valve is normal in structure. Moderate mitral annular calcification. Trivial mitral valve regurgitation. No evidence of mitral valve stenosis. Tricuspid Valve: The tricuspid valve is normal in structure. Tricuspid valve regurgitation is not demonstrated. No evidence of tricuspid stenosis. Aortic Valve: The aortic valve is normal in structure. Aortic valve regurgitation is trivial. No aortic stenosis is present. Pulmonic Valve: The pulmonic valve  was normal in structure. Pulmonic valve regurgitation is not visualized. No evidence of pulmonic stenosis. Aorta: The aortic root is normal in size and structure. Venous: The inferior vena cava is normal in size with greater than 50% respiratory variability, suggesting right atrial pressure of 3 mmHg. IAS/Shunts: No atrial level shunt detected by color flow Doppler.   Diastology LV e' medial:    7.72 cm/s LV E/e' medial:  9.8 LV e' lateral:   8.27 cm/s LV E/e' lateral: 9.2  LEFT ATRIUM           Index        RIGHT ATRIUM           Index LA Vol (A4C): 48.3 ml 28.97 ml/m  RA Area:     11.50 cm  RA Volume:   24.10 ml  14.45 ml/m  AORTIC VALVE LVOT Vmax:   90.00 cm/s LVOT Vmean:  60.200 cm/s LVOT VTI:    0.187 m MITRAL VALVE MV Area (PHT): 4.21 cm     SHUNTS MV Decel Time: 180 msec     Systemic VTI: 0.19 m MV E velocity: 75.80 cm/s MV A velocity: 121.00 cm/s MV E/A ratio:  0.63 Candee Furbish MD Electronically signed by Candee Furbish MD Signature Date/Time: 11/19/2020/1:06:22 PM    Final         Scheduled Meds:  atorvastatin  40 mg Oral Daily   escitalopram  20 mg Oral Daily   gabapentin  300 mg Oral Daily   [START ON 11/20/2020] influenza vaccine adjuvanted  0.5 mL Intramuscular Tomorrow-1000   Continuous Infusions:  sodium chloride 75 mL/hr at 11/19/20 1526     LOS: 0 days    Time spent: 35 minutes    Dana Allan, MD  [facility] Pager #: [phone] 7PM-7AM contact night coverage as above

## 2020-11-20 LAB — RENAL FUNCTION PANEL
Albumin: 2.8 g/dL — ABNORMAL LOW (ref 3.5–5.0)
Anion gap: 7 (ref 5–15)
BUN: 11 mg/dL (ref 8–23)
CO2: 24 mmol/L (ref 22–32)
Calcium: 7.7 mg/dL — ABNORMAL LOW (ref 8.9–10.3)
Chloride: 100 mmol/L (ref 98–111)
Creatinine, Ser: 0.61 mg/dL (ref 0.44–1.00)
GFR, Estimated: >60 mL/min (ref >60)
Glucose, Bld: 106 mg/dL — ABNORMAL HIGH (ref 70–99)
Phosphorus: 2.2 mg/dL — ABNORMAL LOW (ref 2.5–4.6)
Potassium: 3.3 mmol/L — ABNORMAL LOW (ref 3.5–5.1)
Sodium: 131 mmol/L — ABNORMAL LOW (ref 135–145)

## 2020-11-20 LAB — MAGNESIUM: Magnesium: 1.8 mg/dL (ref 1.7–2.4)

## 2020-11-20 LAB — CBC WITH DIFFERENTIAL/PLATELET
Abs Immature Granulocytes: 0.03 10*3/uL (ref 0.00–0.07)
Basophils Absolute: 0 10*3/uL (ref 0.0–0.1)
Basophils Relative: 0 %
Eosinophils Absolute: 0.1 10*3/uL (ref 0.0–0.5)
Eosinophils Relative: 1 %
HCT: 28.5 % — ABNORMAL LOW (ref 36.0–46.0)
Hemoglobin: 9.1 g/dL — ABNORMAL LOW (ref 12.0–15.0)
Immature Granulocytes: 1 %
Lymphocytes Relative: 18 %
Lymphs Abs: 1.2 10*3/uL (ref 0.7–4.0)
MCH: 29.1 pg (ref 26.0–34.0)
MCHC: 31.9 g/dL (ref 30.0–36.0)
MCV: 91.1 fL (ref 80.0–100.0)
Monocytes Absolute: 0.4 10*3/uL (ref 0.1–1.0)
Monocytes Relative: 7 %
Neutro Abs: 4.7 10*3/uL (ref 1.7–7.7)
Neutrophils Relative %: 73 %
Platelets: 120 10*3/uL — ABNORMAL LOW (ref 150–400)
RBC: 3.13 MIL/uL — ABNORMAL LOW (ref 3.87–5.11)
RDW: 14.3 % (ref 11.5–15.5)
WBC: 6.4 10*3/uL (ref 4.0–10.5)
nRBC: 0 % (ref 0.0–0.2)

## 2020-11-20 MED ORDER — FLUTICASONE PROPIONATE 50 MCG/ACT NA SUSP
1.0000 | Freq: Every day | NASAL | Status: DC
  Administered 2020-11-20 – 2020-11-23 (×4): 1 via NASAL
  Filled 2020-11-20: qty 16

## 2020-11-20 MED ORDER — ALUM & MAG HYDROXIDE-SIMETH 200-200-20 MG/5ML PO SUSP
30.0000 mL | Freq: Four times a day (QID) | ORAL | Status: DC | PRN
  Administered 2020-11-20: 30 mL via ORAL
  Filled 2020-11-20 (×4): qty 30

## 2020-11-20 MED ORDER — POTASSIUM PHOSPHATES 15 MMOLE/5ML IV SOLN
30.0000 mmol | Freq: Once | INTRAVENOUS | Status: AC
  Administered 2020-11-20: 30 mmol via INTRAVENOUS
  Filled 2020-11-20: qty 10

## 2020-11-20 NOTE — Progress Notes (Signed)
Physical Therapy Treatment Patient Details Name: Hailey [name] MRN: [id number] DOB: 1940-12-08 Today's Date: 11/20/2020   History of Present Illness Pt is an 80 y.o. female who presented 11/18/20 with syncope episode. Imaging revealed thin left SDH. 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 PMH: chronic diastolic heart failure, hyperlipidemia, subarachnoid hemorrhage in December 2015    PT Comments    The pt was able to demo good progress with OOB mobility at this time with VSS. The pt continues to require minA to complete bed mobility due to pain in L shoulder and prior weakness/limitations. She was able to complete multiple sit-stand transfers from EOB and BSC with minA to minG with cues for hand positioning. The pt demos poor functional strength, power, and stability at this time, but was able to manage pivoting steps and standing marches with only minG for safety and BP steady. Pt with marked stiffness and pain in L cervical musculature and is unable to perform any gentle ROM due to prior fusions. Unable to tolerate gentle massage at this time due to tenderness, given ice pack and educated in gentle ROM of L shoulder to mobilize cervical musculature. Will continue to benefit from skilled PT acutely and following d/c home to maximize return to independence and reduce risk of future falls.     Recommendations for follow up therapy are one component of a multi-disciplinary discharge planning process, led by the attending physician.  Recommendations may be updated based on patient status, additional functional criteria and insurance authorization.  Follow Up Recommendations  Home health PT;Supervision for mobility/OOB     Equipment Recommendations  None recommended by PT    Recommendations for Other Services       Precautions / Restrictions Precautions Precautions: Fall;Other (comment) Precaution Comments: orthostatic hypotension at eval, not  today Restrictions Weight Bearing Restrictions: No     Mobility  Bed Mobility Overal bed mobility: Needs Assistance Bed Mobility: Rolling;Sidelying to Sit;Sit to Supine Rolling: Min guard Sidelying to sit: Min assist;HOB elevated   Sit to supine: Min assist;HOB elevated   General bed mobility comments: moves slowly, but able to complete log roll with use of bed rails and increased time. assist to move LE to EOB and to return to bed at end of session    Transfers Overall transfer level: Needs assistance Equipment used: Rolling walker (2 wheeled) Transfers: Sit to/from [facility] Sit to Stand: Min assist Stand pivot transfers: Min guard       General transfer comment: VSS, minA to power up with increased time and cues for hand positioning.  Ambulation/Gait Ambulation/Gait assistance: Min assist Gait Distance (Feet): 3 Feet (x2) Assistive device: Rolling walker (2 wheeled) Gait Pattern/deviations: Step-to pattern;Decreased stride length Gait velocity: decreased Gait velocity interpretation: <1.31 ft/sec, indicative of household ambulator General Gait Details: pt taking small lateral steps to [facility] with minA initially and then progressed to minG with reps. pt able to manage static standing marches with BUE support and minG VSS     Modified Rankin (Stroke Patients Only) Modified Rankin (Stroke Patients Only) Pre-Morbid Rankin Score: Moderate disability Modified Rankin: Moderately severe disability     Balance Overall balance assessment: Needs assistance Sitting-balance support: No upper extremity supported;Feet supported Sitting balance-Leahy Scale: Fair Sitting balance - Comments: Static sitting EOB with supervision for safety.   Standing balance support: Bilateral upper extremity supported;During functional activity Standing balance-Leahy Scale: Poor Standing balance comment: dependent on BUE support  Cognition  Arousal/Alertness: Awake/alert Behavior During Therapy: WFL for tasks assessed/performed Overall Cognitive Status: Within Functional Limits for tasks assessed                                 General Comments: A&Ox4.      Exercises      General Comments General comments (skin integrity, edema, etc.): VSS on RA, HB:716/96(78) in supine, 120/99 (107) in standing, 111/67 (81) after standing for 3 min      Pertinent Vitals/Pain Pain Assessment: Faces Pain Score: 7  Pain Location: neck, L shoulder Pain Descriptors / Indicators: Discomfort;Grimacing;Guarding Pain Intervention(s): Limited activity within patient's tolerance;Monitored during session;Repositioned;Ice applied (pt unable to tolerate gentle massage at this time)    Home Living Family/patient expects to be discharged to:: Private residence Living Arrangements: Alone Available Help at Discharge: Personal care attendant;Available 24 hours/day;Family;Friend(s)                    PT Goals (current goals can now be found in the care plan section) Acute Rehab PT Goals Patient Stated Goal: to go home to her cat PT Goal Formulation: With patient Time For Goal Achievement: 12/03/20 Potential to Achieve Goals: Good Progress towards PT goals: Progressing toward goals    Frequency    Min 3X/week      PT Plan Current plan remains appropriate       AM-PAC PT "6 Clicks" Mobility   Outcome Measure  Help needed turning from your back to your side while in a flat bed without using bedrails?: A Little Help needed moving from lying on your back to sitting on the side of a flat bed without using bedrails?: A Little Help needed moving to and from a bed to a chair (including a wheelchair)?: A Little Help needed standing up from a chair using your arms (e.g., wheelchair or bedside chair)?: A Little Help needed to walk in hospital room?: A Little Help needed climbing 3-5 steps with a railing? : A Little 6 Click  Score: 18    End of Session Equipment Utilized During Treatment: Gait belt Activity Tolerance: Patient limited by pain;Patient tolerated treatment well Patient left: in bed;with call bell/phone within reach;with bed alarm set;with family/visitor present Nurse Communication: Mobility status PT Visit Diagnosis: Muscle weakness (generalized) (M62.81);History of falling (Z91.81);Difficulty in walking, not elsewhere classified (R26.2);Dizziness and giddiness (R42)     Time: 9381-0175 PT Time Calculation (min) (ACUTE ONLY): 41 min  Charges:  $Gait Training: 8-22 mins $Therapeutic Activity: 23-37 mins                     West Carbo, PT, DPT   Acute Rehabilitation Department Pager #: [phone]   Sandra Cockayne 11/20/2020, 4:55 PM

## 2020-11-20 NOTE — Plan of Care (Signed)
Patient progressing 

## 2020-11-20 NOTE — Progress Notes (Signed)
PROGRESS NOTE    [name]  XTG:[id number] DOB: [birth year] DOA: 11/18/2020 PCP: Hoyt Koch, MD  Outpatient Specialists:   Brief Narrative:  Patient is an 80 year old Caucasian female with past medical history significant for chronic diastolic heart failure, hyperlipidemia and subarachnoid hemorrhage.  Patient is mildly cachectic with BMI of 19.89 kg per metered square.  Patient was admitted with syncope/fall at home.  Apparently, patient endorsed bouts of diarrhea prior to the syncope.  Patient is significantly orthostatic.  Patient is currently being worked up.  Temperature of 102.7 has been documented.  Patient has dryness of the tongue, but reports that this has been chronic.  Duration of unconsciousness following the syncope and fall is unknown.  Significant lab findings include sodium of 134, stable troponin, normal WBC but with left shift, hemoglobin of 10.2 to 10.9 g/dL (hemoglobin was 12.4 g/dL about 6 weeks ago).  CT head without contrast revealed thin subdural hematoma without mass-effect.  11/20/2020: Patient seen.  Patient feels a lot better today.  No fever noted.  Chest x-ray is nonrevealing.  Will resume orthostatic vital signs.  We will follow cultures.  Cultures to date is negative.  Low threshold to de-escalate antibiotics.  Physical therapy input is appreciated.  Will monitor on correct electrolytes.  Magnesium and potassium levels noted.  Assessment & Plan:   Principal Problem:   Syncope Active Problems:   HLD (hyperlipidemia)   Seasonal and perennial allergic rhinitis   INSOMNIA   Closed rib fracture   Subdural hematoma   Acute prerenal azotemia   Chronic diastolic CHF (congestive heart failure) (HCC)   SIRS (systemic inflammatory response syndrome) (HCC)     Fever/Syncope/Orthostatic Hypotension/SIRS: -Work-up is in progress. -Cultures have not grown any organisms. -Follow final cultures. -Continue IV antibiotics (broad-spectrum) for now. -Have a  low threshold to de-escalate antibiotics.   -Continue to monitor orthostasis. -Telemetry monitoring. -Further management depend on hospital course.   Volume depletion: -Resolved. -We will DC IV fluids.    Hyponatremia: -Urine sodium is 160, urine osmolality 601 and plasma osmolality is 275. 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   -Sodium today is 131.  Subdural hematoma: -Neurosurgery is aware of the case. -Continue to monitor closely.    Low voltage EKG: -Echocardiogram is negative for pericardial effusion. -TSH is 1.643 and free T4 is 0.87.    Chronic diastolic CHF: -Compensated. -Continue to monitor symptoms while on IV fluids.  Cachexia: -BNP of 19.89 kg per metered square. -Dietary consult. -Caloric count.  Chronic bilateral rib fractures: Allergic rhinitis. Depression. Hyperlipidemia  DVT prophylaxis: SCD. Code Status: Full code Family Communication:  Disposition Plan: This will depend on hospital course   Consultants:  Neurosurgery is aware of patient  Procedures:  None  Antimicrobials:  We will start patient on IV Vanco/cefepime as per pharmacy   Subjective: No further fever documented. Patient also denies fever or chills. Patient feels a lot better today.    Objective: Vitals:   11/19/20 1935 11/20/20 0022 11/20/20 0345 11/20/20 1126  BP: 119/69 (!) 105/57 (!) 110/50 (!) 113/57  Pulse: 77 71 70 77  Resp: 18 18 18 18   Temp: 98.5 F (36.9 C) 98.2 F (36.8 C) (!) 97.5 F (36.4 C) 97.9 F (36.6 C)  TempSrc: Oral Oral Oral Oral  SpO2: 95% 96% 96% 96%  Weight:  60.1 kg    Height:        Intake/Output Summary (Last 24 hours) at 11/20/2020 1707 Last data filed at 11/20/2020 1343 Gross  per 24 hour  Intake 340 ml  Output 1000 ml  Net -660 ml   Filed Weights   11/18/20 1225 11/20/20 0022  Weight: 57.6 kg 60.1 kg    Examination:  General exam: Appears calm and comfortable.   Respiratory system: Clear to auscultation.   Cardiovascular system: S1 & S2  Gastrointestinal system: Abdomen is nondistended, soft and nontender. No organomegaly or masses felt. Normal bowel sounds heard. Central nervous system: Awake and alert.  Patient moves all extremities.  Extremities: No leg edema. Data Reviewed: I have personally reviewed following labs and imaging studies  CBC: Recent Labs  Lab 11/18/20 1201 11/19/20 0320 11/20/20 0213  WBC 6.3 7.6 6.4  NEUTROABS 5.1  --  4.7  HGB 10.2* 10.9* 9.1*  HCT 32.8* 34.4* 28.5*  MCV 93.4 90.8 91.1  PLT 141* 142* 120*    Basic Metabolic Panel: Recent Labs  Lab 11/18/20 1201 11/19/20 0320 11/20/20 0213  NA 134* 134* 131*  K 4.1 3.6 3.3*  CL 101 99 100  CO2 24 23 24   GLUCOSE 105* 133* 106*  BUN 17 12 11   CREATININE 0.73 0.78 0.61  CALCIUM 8.5* 8.7* 7.7*  MG  --  1.9 1.8  PHOS  --   --  2.2*    GFR: Estimated Creatinine Clearance: 53.2 mL/min (by C-G formula based on SCr of 0.61 mg/dL). Liver Function Tests: Recent Labs  Lab 11/19/20 0320 11/20/20 0213  AST 23  --   ALT 14  --   ALKPHOS 41  --   BILITOT 0.7  --   PROT 6.1*  --   ALBUMIN 3.5 2.8*    No results for input(s): LIPASE, AMYLASE in the last 168 hours. No results for input(s): AMMONIA in the last 168 hours. Coagulation Profile: Recent Labs  Lab 11/18/20 1201 11/19/20 0320  INR 1.0 1.1    Cardiac Enzymes: No results for input(s): CKTOTAL, CKMB, CKMBINDEX, TROPONINI in the last 168 hours. BNP (last 3 results) No results for input(s): PROBNP in the last 8760 hours. HbA1C: No results for input(s): HGBA1C in the last 72 hours. CBG: Recent Labs  Lab 11/18/20 1900  GLUCAP 99    Lipid Profile: No results for input(s): CHOL, HDL, LDLCALC, TRIG, CHOLHDL, LDLDIRECT in the last 72 hours. Thyroid Function Tests: Recent Labs    11/19/20 1843  TSH 1.643  FREET4 0.87   Anemia Panel: No results for input(s): VITAMINB12, FOLATE, FERRITIN, TIBC, IRON, RETICCTPCT in the last 72  hours. Urine analysis:    Component Value Date/Time   COLORURINE YELLOW 08/03/2014 0509   APPEARANCEUR CLEAR 08/03/2014 0509   LABSPEC 1.016 08/03/2014 0509   PHURINE 6.0 08/03/2014 0509   GLUCOSEU NEGATIVE 08/03/2014 0509   HGBUR NEGATIVE 08/03/2014 0509   HGBUR large 06/07/2008 1455   BILIRUBINUR NEGATIVE 08/03/2014 0509   KETONESUR NEGATIVE 08/03/2014 0509   PROTEINUR NEGATIVE 08/03/2014 0509   UROBILINOGEN 0.2 08/03/2014 0509   NITRITE NEGATIVE 08/03/2014 0509   LEUKOCYTESUR TRACE (A) 08/03/2014 0509   Sepsis Labs: @LABRCNTIP (procalcitonin:4,lacticidven:4)  ) Recent Results (from the past 240 hour(s))  Resp Panel by RT-PCR (Flu A&B, Covid) Nasopharyngeal Swab     Status: None   Collection Time: 11/18/20  7:40 PM   Specimen: Nasopharyngeal Swab; Nasopharyngeal(NP) swabs in vial transport medium  Result Value Ref Range Status   SARS Coronavirus 2 by RT PCR NEGATIVE NEGATIVE Final    Comment: (NOTE) SARS-CoV-2 target nucleic acids are NOT DETECTED.  The SARS-CoV-2 RNA is generally detectable in  upper respiratory specimens during the acute phase of infection. The lowest concentration of SARS-CoV-2 viral copies this assay can detect is 138 copies/mL. A negative result does not preclude SARS-Cov-2 infection and should not be used as the sole basis for treatment or other patient management decisions. A negative result may occur with  improper specimen collection/handling, submission of specimen other than nasopharyngeal swab, presence of viral mutation(s) within the areas targeted by this assay, and inadequate number of viral copies(<138 copies/mL). A negative result must be combined with clinical observations, patient history, and epidemiological information. The expected result is Negative.  Fact Sheet for Patients:  EntrepreneurPulse.com.au  Fact Sheet for Healthcare Providers:  IncredibleEmployment.be  This test is no t yet  approved or cleared by the [place] FDA and  has been authorized for detection and/or diagnosis of SARS-CoV-2 by FDA under an Emergency Use Authorization (EUA). This EUA will remain  in effect (meaning this test can be used) for the duration of the COVID-19 declaration under Section 564(b)(1) of the Act, 21 U.S.C.section 360bbb-3(b)(1), unless the authorization is terminated  or revoked sooner.       Influenza A by PCR NEGATIVE NEGATIVE Final   Influenza B by PCR NEGATIVE NEGATIVE Final    Comment: (NOTE) The Xpert Xpress SARS-CoV-2/FLU/RSV plus assay is intended as an aid in the diagnosis of influenza from Nasopharyngeal swab specimens and should not be used as a sole basis for treatment. Nasal washings and aspirates are unacceptable for Xpert Xpress SARS-CoV-2/FLU/RSV testing.  Fact Sheet for Patients: EntrepreneurPulse.com.au  Fact Sheet for Healthcare Providers: IncredibleEmployment.be  This test is not yet approved or cleared by the [place] FDA and has been authorized for detection and/or diagnosis of SARS-CoV-2 by FDA under an Emergency Use Authorization (EUA). This EUA will remain in effect (meaning this test can be used) for the duration of the COVID-19 declaration under Section 564(b)(1) of the Act, 21 U.S.C. section 360bbb-3(b)(1), unless the authorization is terminated or revoked.  Performed at [facility], [place] [address], [place], [place] [address]   Culture, blood (routine x 2)     Status: None (Preliminary result)   Collection Time: 11/19/20  6:41 PM   Specimen: BLOOD LEFT FOREARM  Result Value Ref Range Status   Specimen Description BLOOD LEFT FOREARM  Final   Special Requests IN PEDIATRIC BOTTLE Blood Culture adequate volume  Final   Culture   Final    NO GROWTH < 24 HOURS Performed at [facility], [place] [address], [place], [place] [address]    Report Status PENDING  Incomplete  Culture, blood  (routine x 2)     Status: None (Preliminary result)   Collection Time: 11/19/20  6:43 PM   Specimen: BLOOD RIGHT ARM  Result Value Ref Range Status   Specimen Description BLOOD RIGHT ARM  Final   Special Requests IN PEDIATRIC BOTTLE Blood Culture adequate volume  Final   Culture   Final    NO GROWTH < 24 HOURS Performed at [facility], [place] [address] [place], [place] [address]    Report Status PENDING  Incomplete         Radiology Studies: DG Chest 2 View  Result Date: 11/19/2020 CLINICAL DATA:  Fever, congestive heart failure EXAM: CHEST - 2 VIEW COMPARISON:  11/18/2020 FINDINGS: The lungs are symmetrically well expanded and are clear. No pneumothorax or pleural effusion. Rim calcified density overlying the left mid lung zone corresponds to a subcutaneous collection, possibly  representing the sequela of remote surgery, better seen on CT examination of 08/18/2015. Cardiac size within normal limits. Stable mediastinal shift to the right. Left total shoulder arthroplasty has been performed. Remote bilateral distal clavicle fractures again noted. Multiple remote bilateral rib fractures again noted. No acute bone abnormality. Extensive spinal fusion hardware again noted. IMPRESSION: No active cardiopulmonary disease. Electronically Signed   By: Fidela Salisbury M.D.   On: 11/19/2020 19:18   ECHOCARDIOGRAM LIMITED  Result Date: 11/19/2020    ECHOCARDIOGRAM REPORT   Patient Name:   Hailey [name] Date of Exam: 11/19/2020 Medical Rec #:  [id number]     Height:       67.0 in Accession #:    [id number]    Weight:       127.0 lb Date of Birth:  [birth year]     BSA:          1.667 m Patient Age:    36 years      BP:           115/77 mmHg Patient Gender: F             HR:           80 bpm. Exam Location:  Inpatient Procedure: Limited Echo, Limited Color Doppler and Cardiac Doppler Indications:    syncope  History:        Patient has prior history of Echocardiogram examinations, most                  recent 12/23/2014. Risk Factors:Dyslipidemia.  Sonographer:    Johny Chess RDCS Referring Phys: [id number] Rhetta Mura  Sonographer Comments: Technically difficult study due to poor echo windows, Technically challenging study due to limited acoustic windows, no parasternal window and no apical window. Image acquisition challenging due to respiratory motion. IMPRESSIONS  1. Left ventricular ejection fraction, by estimation, is >75%. The left ventricle has hyperdynamic function. The left ventricle has no regional wall motion abnormalities. Left ventricular diastolic parameters are consistent with Grade I diastolic dysfunction (impaired relaxation).  2. Right ventricular systolic function is normal. The right ventricular size is normal.  3. The mitral valve is normal in structure. Trivial mitral valve regurgitation. No evidence of mitral stenosis. Moderate mitral annular calcification.  4. The aortic valve is normal in structure. Aortic valve regurgitation is trivial. No aortic stenosis is present.  5. The inferior vena cava is normal in size with greater than 50% respiratory variability, suggesting right atrial pressure of 3 mmHg. FINDINGS  Left Ventricle: Left ventricular ejection fraction, by estimation, is >75%. The left ventricle has hyperdynamic function. The left ventricle has no regional wall motion abnormalities. The left ventricular internal cavity size was normal in size. There is no left ventricular hypertrophy. Left ventricular diastolic parameters are consistent with Grade I diastolic dysfunction (impaired relaxation). Right Ventricle: The right ventricular size is normal. No increase in right ventricular wall thickness. Right ventricular systolic function is normal. Left Atrium: Left atrial size was normal in size. Right Atrium: Right atrial size was normal in size. Pericardium: There is no evidence of pericardial effusion. Mitral Valve: The mitral valve is normal in structure. Moderate mitral  annular calcification. Trivial mitral valve regurgitation. No evidence of mitral valve stenosis. Tricuspid Valve: The tricuspid valve is normal in structure. Tricuspid valve regurgitation is not demonstrated. No evidence of tricuspid stenosis. Aortic Valve: The aortic valve is normal in structure. Aortic valve regurgitation is trivial. No aortic stenosis is present. Pulmonic Valve: The pulmonic  valve was normal in structure. Pulmonic valve regurgitation is not visualized. No evidence of pulmonic stenosis. Aorta: The aortic root is normal in size and structure. Venous: The inferior vena cava is normal in size with greater than 50% respiratory variability, suggesting right atrial pressure of 3 mmHg. IAS/Shunts: No atrial level shunt detected by color flow Doppler.   Diastology LV e' medial:    7.72 cm/s LV E/e' medial:  9.8 LV e' lateral:   8.27 cm/s LV E/e' lateral: 9.2  LEFT ATRIUM           Index        RIGHT ATRIUM           Index LA Vol (A4C): 48.3 ml 28.97 ml/m  RA Area:     11.50 cm                                    RA Volume:   24.10 ml  14.45 ml/m  AORTIC VALVE LVOT Vmax:   90.00 cm/s LVOT Vmean:  60.200 cm/s LVOT VTI:    0.187 m MITRAL VALVE MV Area (PHT): 4.21 cm     SHUNTS MV Decel Time: 180 msec     Systemic VTI: 0.19 m MV E velocity: 75.80 cm/s MV A velocity: 121.00 cm/s MV E/A ratio:  0.63 Candee Furbish MD Electronically signed by Candee Furbish MD Signature Date/Time: 11/19/2020/1:06:22 PM    Final         Scheduled Meds:  atorvastatin  40 mg Oral Daily   escitalopram  20 mg Oral Daily   gabapentin  300 mg Oral Daily   influenza vaccine adjuvanted  0.5 mL Intramuscular Tomorrow-1000   Continuous Infusions:  sodium chloride 75 mL/hr at 11/20/20 0802   ceFEPime (MAXIPIME) IV 2 g (11/20/20 0805)   potassium PHOSPHATE IVPB (in mmol) 30 mmol (11/20/20 1345)   vancomycin 1,000 mg (11/19/20 2338)     LOS: 1 day    Time spent: 35 minutes    Dana Allan, MD  [facility] Pager #: [phone] 7PM-7AM contact night coverage as above

## 2020-11-21 MED ORDER — MAGNESIUM SULFATE 2 GM/50ML IV SOLN
2.0000 g | Freq: Once | INTRAVENOUS | Status: AC
  Administered 2020-11-21: 2 g via INTRAVENOUS
  Filled 2020-11-21: qty 50

## 2020-11-21 MED ORDER — SODIUM PHOSPHATES 45 MMOLE/15ML IV SOLN
30.0000 mmol | Freq: Once | INTRAVENOUS | Status: AC
  Administered 2020-11-21: 30 mmol via INTRAVENOUS
  Filled 2020-11-21: qty 10

## 2020-11-21 MED ORDER — POTASSIUM CHLORIDE CRYS ER 20 MEQ PO TBCR
40.0000 meq | EXTENDED_RELEASE_TABLET | Freq: Once | ORAL | Status: AC
  Administered 2020-11-21: 40 meq via ORAL
  Filled 2020-11-21: qty 2

## 2020-11-21 MED ORDER — ENSURE ENLIVE PO LIQD
237.0000 mL | Freq: Two times a day (BID) | ORAL | Status: DC
  Administered 2020-11-21 – 2020-11-23 (×4): 237 mL via ORAL

## 2020-11-21 MED ORDER — ADULT MULTIVITAMIN W/MINERALS CH
1.0000 | ORAL_TABLET | Freq: Every day | ORAL | Status: DC
  Administered 2020-11-21 – 2020-11-23 (×3): 1 via ORAL
  Filled 2020-11-21 (×3): qty 1

## 2020-11-21 NOTE — Progress Notes (Signed)
Initial Nutrition Assessment  DOCUMENTATION CODES:   Non-severe (moderate) malnutrition in context of chronic illness  INTERVENTION:   -D/c calorie count -Ensure Enlive po BID, each supplement provides 350 kcal and 20 grams of protein  -MVI with minerals daily  NUTRITION DIAGNOSIS:   Moderate Malnutrition related to chronic illness (CHF) as evidenced by moderate fat depletion, moderate muscle depletion, severe muscle depletion, severe fat depletion.  GOAL:   Patient will meet greater than or equal to 90% of their needs  MONITOR:   PO intake, Supplement acceptance, Labs, Weight trends, Skin, I & O's  REASON FOR ASSESSMENT:   Consult Assessment of nutrition requirement/status, Calorie Count  ASSESSMENT:   Hailey [name] is a 80 y.o. female with medical history significant for chronic diastolic heart failure, hyperlipidemia, subarachnoid hemorrhage in December 2015, who is admitted to [facility] on 11/18/2020 for further evaluation management of syncope after presenting from home to [facility] ED complaining of such.  Pt admitted with syncope.   Reviewed I/O's: -1.8 L x 24 hours and -2.1 L since admission  UOP: 2.3 L x 24 hours  Pt very somnolent at time of visit. She did not arouse to voice or touch. Lunch tray just delivered to pt, which was unattempted.   Pt with good meal intake. Noted meal completion 75-100%.   Reviewed wt hx; wt has been stable over the past 3 months. Suspect CHF/ fluid overload may be masking true weight loss as well as fat and muscle depletions.   Labs reviewed: Na: 131, K: 3.3, Phos: 2.2.   NUTRITION - FOCUSED PHYSICAL EXAM:  Flowsheet Row Most Recent Value  Orbital Region Moderate depletion  Upper Arm Region Severe depletion  Thoracic and Lumbar Region Moderate depletion  Buccal Region Moderate depletion  Temple Region Severe depletion  Clavicle Bone Region Severe depletion  Clavicle and Acromion Bone Region Moderate depletion  Scapular  Bone Region Moderate depletion  Dorsal Hand Moderate depletion  Patellar Region Moderate depletion  Anterior Thigh Region Moderate depletion  Posterior Calf Region Moderate depletion  Edema (RD Assessment) None  Hair Reviewed  Eyes Reviewed  Mouth Reviewed  Skin Reviewed  Nails Reviewed       Diet Order:   Diet Order             Diet regular Room service appropriate? Yes; Fluid consistency: Thin  Diet effective now                   EDUCATION NEEDS:   No education needs have been identified at this time  Skin:  Skin Assessment: Reviewed RN Assessment  Last BM:  11/20/20  Height:   Ht Readings from Last 1 Encounters:  11/18/20 5\' 7"  (1.702 m)    Weight:   Wt Readings from Last 1 Encounters:  11/20/20 60.1 kg    Ideal Body Weight:  61.4 kg  BMI:  Body mass index is 20.75 kg/m.  Estimated Nutritional Needs:   Kcal:  1800-2000  Protein:  90-105 grams  Fluid:  > 1.8 L    Loistine Chance, RD, LDN, [place] Registered Dietitian II Certified Diabetes Care and Education Specialist Please refer to [facility] for RD and/or RD on-call/weekend/after hours pager

## 2020-11-21 NOTE — Progress Notes (Signed)
PROGRESS NOTE    Hailey WENDELL  [name]:[id number] DOB: June 01, 1940 DOA: 11/18/2020 PCP: Hoyt Koch, MD    Chief Complaint  Patient presents with   Fall    Brief Narrative:  Patient is an 80 year old Caucasian female with past medical history significant for chronic diastolic heart failure, hyperlipidemia and subarachnoid hemorrhage, cathectic , admitted for recurrent syncope and falls. CT head without contrast revealed thin subdural hematoma without mass-effect.  Assessment & Plan:   Principal Problem:   Syncope Active Problems:   HLD (hyperlipidemia)   Seasonal and perennial allergic rhinitis   INSOMNIA   Closed rib fracture   Subdural hematoma   Acute prerenal azotemia   Chronic diastolic CHF (congestive heart failure) (HCC)   SIRS (systemic inflammatory response syndrome) (HCC)   Fever/ SIRS Resolved.  Cultures have been negative.    Syncope secondary to orthostatic hypotension - discussed with daughter who reports that she has been falling many times.  - echo is not significant for abnormalities.  - no arrhythmias on telemetry today.  - CT head showed acute sub dural hematoma.  - MRI brain without contrast ordered for further evaluation.  - if MRI is negative, will request cardiology to see if she needs event monitor placement.    Hyponatremia:  Improving.    Chronic diastolic CHF.  - she appears compensated.    Cachexia:  Will get dietary consult.     Chronic diarrhea:  Unclear etiology.  Will get stool for GI pathogen PCR.    Chronic bilateral rib fractures ? Recurrent falls.   Subdural hematoma:  Pt alert and oriented, denies any headache today.      DVT prophylaxis: (SCD'S) Code Status: (Full CODE) Family Communication: (Discussed with daughter over the phone) Disposition:   Status is: Inpatient  Remains inpatient appropriate because:Ongoing diagnostic testing needed not appropriate for outpatient work up, Unsafe d/c plan,  and IV treatments appropriate due to intensity of illness or inability to take PO  Dispo: The patient is from: Home              Anticipated d/c is to: Home              Patient currently is not medically stable to d/c.   Difficult to place patient No       Consultants:  None.   Procedures:  Echo MRI brain.   Antimicrobials: none.   Subjective: Reports having loose bowel movements.   Objective: Vitals:   11/20/20 1126 11/20/20 2000 11/21/20 0500 11/21/20 1208  BP: (!) 113/57 134/77 (!) 153/74 (!) 142/79  Pulse: 77 78 68 70  Resp: 18 18 18 20   Temp: 97.9 F (36.6 C) 98.1 F (36.7 C) 97.9 F (36.6 C) 98.2 F (36.8 C)  TempSrc: Oral Oral Oral Oral  SpO2: 96% 97% 98%   Weight:      Height:        Intake/Output Summary (Last 24 hours) at 11/21/2020 1452 Last data filed at 11/21/2020 0917 Gross per 24 hour  Intake 120 ml  Output 2300 ml  Net -2180 ml   Filed Weights   11/18/20 1225 11/20/20 0022  Weight: 57.6 kg 60.1 kg    Examination:  General exam: Appears calm and comfortable  Respiratory system: Clear to auscultation. Respiratory effort normal. Cardiovascular system: S1 & S2 heard, RRR. No JVD, No pedal edema. Gastrointestinal system: Abdomen is nondistended, soft and nontender. Normal bowel sounds heard. Central nervous system: Alert and oriented. No focal neurological deficits.  Extremities: Symmetric 5 x 5 power. Skin: No rashes, lesions or ulcers Psychiatry: Mood & affect appropriate.     Data Reviewed: I have personally reviewed following labs and imaging studies  CBC: Recent Labs  Lab 11/18/20 1201 11/19/20 0320 11/20/20 0213  WBC 6.3 7.6 6.4  NEUTROABS 5.1  --  4.7  HGB 10.2* 10.9* 9.1*  HCT 32.8* 34.4* 28.5*  MCV 93.4 90.8 91.1  PLT 141* 142* 120*    Basic Metabolic Panel: Recent Labs  Lab 11/18/20 1201 11/19/20 0320 11/20/20 0213  NA 134* 134* 131*  K 4.1 3.6 3.3*  CL 101 99 100  CO2 24 23 24   GLUCOSE 105* 133* 106*   BUN 17 12 11   CREATININE 0.73 0.78 0.61  CALCIUM 8.5* 8.7* 7.7*  MG  --  1.9 1.8  PHOS  --   --  2.2*    GFR: Estimated Creatinine Clearance: 53.2 mL/min (by C-G formula based on SCr of 0.61 mg/dL).  Liver Function Tests: Recent Labs  Lab 11/19/20 0320 11/20/20 0213  AST 23  --   ALT 14  --   ALKPHOS 41  --   BILITOT 0.7  --   PROT 6.1*  --   ALBUMIN 3.5 2.8*    CBG: Recent Labs  Lab 11/18/20 1900  GLUCAP 99     Recent Results (from the past 240 hour(s))  Resp Panel by RT-PCR (Flu A&B, Covid) Nasopharyngeal Swab     Status: None   Collection Time: 11/18/20  7:40 PM   Specimen: Nasopharyngeal Swab; Nasopharyngeal(NP) swabs in vial transport medium  Result Value Ref Range Status   SARS Coronavirus 2 by RT PCR NEGATIVE NEGATIVE Final    Comment: (NOTE) SARS-CoV-2 target nucleic acids are NOT DETECTED.  The SARS-CoV-2 RNA is generally detectable in upper respiratory specimens during the acute phase of infection. The lowest concentration of SARS-CoV-2 viral copies this assay can detect is 138 copies/mL. A negative result does not preclude SARS-Cov-2 infection and should not be used as the sole basis for treatment or other patient management decisions. A negative result may occur with  improper specimen collection/handling, submission of specimen other than nasopharyngeal swab, presence of viral mutation(s) within the areas targeted by this assay, and inadequate number of viral copies(<138 copies/mL). A negative result must be combined with clinical observations, patient history, and epidemiological information. The expected result is Negative.  Fact Sheet for Patients:  EntrepreneurPulse.com.au  Fact Sheet for Healthcare Providers:  IncredibleEmployment.be  This test is no t yet approved or cleared by the [place] FDA and  has been authorized for detection and/or diagnosis of SARS-CoV-2 by FDA under an Emergency Use  Authorization (EUA). This EUA will remain  in effect (meaning this test can be used) for the duration of the COVID-19 declaration under Section 564(b)(1) of the Act, 21 U.S.C.section 360bbb-3(b)(1), unless the authorization is terminated  or revoked sooner.       Influenza A by PCR NEGATIVE NEGATIVE Final   Influenza B by PCR NEGATIVE NEGATIVE Final    Comment: (NOTE) The Xpert Xpress SARS-CoV-2/FLU/RSV plus assay is intended as an aid in the diagnosis of influenza from Nasopharyngeal swab specimens and should not be used as a sole basis for treatment. Nasal washings and aspirates are unacceptable for Xpert Xpress SARS-CoV-2/FLU/RSV testing.  Fact Sheet for Patients: EntrepreneurPulse.com.au  Fact Sheet for Healthcare Providers: IncredibleEmployment.be  This test is not yet approved or cleared by the [place] FDA and has been authorized for detection  and/or diagnosis of SARS-CoV-2 by FDA under an Emergency Use Authorization (EUA). This EUA will remain in effect (meaning this test can be used) for the duration of the COVID-19 declaration under Section 564(b)(1) of the Act, 21 U.S.C. section 360bbb-3(b)(1), unless the authorization is terminated or revoked.  Performed at [facility], [place] [address] [place], [place] [address]   Culture, blood (routine x 2)     Status: None (Preliminary result)   Collection Time: 11/19/20  6:41 PM   Specimen: BLOOD LEFT FOREARM  Result Value Ref Range Status   Specimen Description BLOOD LEFT FOREARM  Final   Special Requests IN PEDIATRIC BOTTLE Blood Culture adequate volume  Final   Culture   Final    NO GROWTH 2 DAYS Performed at [facility], [place] [address], [place], [place] [address]    Report Status PENDING  Incomplete  Culture, blood (routine x 2)     Status: None (Preliminary result)   Collection Time: 11/19/20  6:43 PM   Specimen: BLOOD RIGHT ARM  Result Value Ref Range Status    Specimen Description BLOOD RIGHT ARM  Final   Special Requests IN PEDIATRIC BOTTLE Blood Culture adequate volume  Final   Culture   Final    NO GROWTH 2 DAYS Performed at [facility], [place] [address], [place], [place] [address]    Report Status PENDING  Incomplete         Radiology Studies: DG Chest 2 View  Result Date: 11/19/2020 CLINICAL DATA:  Fever, congestive heart failure EXAM: CHEST - 2 VIEW COMPARISON:  11/18/2020 FINDINGS: The lungs are symmetrically well expanded and are clear. No pneumothorax or pleural effusion. Rim calcified density overlying the left mid lung zone corresponds to a subcutaneous collection, possibly representing the sequela of remote surgery, better seen on CT examination of 08/18/2015. Cardiac size within normal limits. Stable mediastinal shift to the right. Left total shoulder arthroplasty has been performed. Remote bilateral distal clavicle fractures again noted. Multiple remote bilateral rib fractures again noted. No acute bone abnormality. Extensive spinal fusion hardware again noted. IMPRESSION: No active cardiopulmonary disease. Electronically Signed   By: Fidela Salisbury M.D.   On: 11/19/2020 19:18        Scheduled Meds:  atorvastatin  40 mg Oral Daily   escitalopram  20 mg Oral Daily   feeding supplement  237 mL Oral BID BM   fluticasone  1 spray Each Nare Daily   gabapentin  300 mg Oral Daily   influenza vaccine adjuvanted  0.5 mL Intramuscular Tomorrow-1000   multivitamin with minerals  1 tablet Oral Daily   Continuous Infusions:  ceFEPime (MAXIPIME) IV 2 g (11/21/20 0724)   sodium phosphate  Dextrose 5% IVPB     vancomycin Stopped (11/21/20 0015)     LOS: 2 days        Hosie Poisson, MD [facility]   To contact the attending provider between 7A-7P or the covering provider during after hours 7P-7A, please log into the web site [email or web address] and access using universal [place] password for that web site. If you do  not have the password, please call the hospital operator.  11/21/2020, 2:52 PM

## 2020-11-21 NOTE — Progress Notes (Signed)
Physical Therapy Treatment Patient Details Name: Hailey [name] MRN: [id number] DOB: [birth year] Today's Date: 11/21/2020   History of Present Illness Pt is an 80 y.o. female who presented 11/18/20 with syncope episode. Imaging revealed thin left SDH. 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 PMH: chronic diastolic heart failure, hyperlipidemia, subarachnoid hemorrhage in December 2015    PT Comments    Pt was seen for longer gait trips today with her regular caregiver in attendance.  Pt and caregiver are comfortable with pt going directly home due to the availability of help and the accessible design.  Pt will need to continue strengthening and control of standing to get her balance for gait on RW without help.    Pt is going to need hands on help at home until HHPT clears her for independent gait.  Follow for goals of PT as outlined in POC.  Recommend pt be OOB in chair and using support on neck along with pain management due to length of spine that is fused.    Recommendations for follow up therapy are one component of a multi-disciplinary discharge planning process, led by the attending physician.  Recommendations may be updated based on patient status, additional functional criteria and insurance authorization.  Follow Up Recommendations  Home health PT;Supervision for mobility/OOB     Equipment Recommendations  None recommended by PT    Recommendations for Other Services       Precautions / Restrictions Precautions Precautions: Fall;Other (comment) Precaution Comments: orthostatic hypotension at eval, not today Restrictions Weight Bearing Restrictions: No     Mobility  Bed Mobility Overal bed mobility: Needs Assistance Bed Mobility: Rolling;Sidelying to Sit;Sit to Supine Rolling: Min guard Sidelying to sit: Min guard   Sit to supine: Min guard   General bed mobility comments: pt is requiring help to move to stand and none needed  for getting in and out of bed    Transfers Overall transfer level: Needs assistance Equipment used: Rolling walker (2 wheeled) Transfers: Sit to/from [facility] Sit to Stand: Min guard;Min assist Stand pivot transfers: Min guard       General transfer comment: pt is up to walk with help and transfers are mainly assisted to power up and able to control descent more now  Ambulation/Gait Ambulation/Gait assistance: Min guard;Min assist Gait Distance (Feet): 70 Feet (35 x 2) Assistive device: Rolling walker (2 wheeled) Gait Pattern/deviations: Step-to pattern;Step-through pattern;Decreased stride length;Narrow base of support Gait velocity: decreased Gait velocity interpretation: <1.31 ft/sec, indicative of household ambulator General Gait Details: pt did a few sidesteps initially and then took off to the door and back in mult trips with pt demosntrating control of hips and knees, ankles stiff and less helpful to pivot   Stairs             Wheelchair Mobility    Modified Rankin (Stroke Patients Only)       Balance Overall balance assessment: Needs assistance Sitting-balance support: Feet supported Sitting balance-Leahy Scale: Fair     Standing balance support: Bilateral upper extremity supported;During functional activity Standing balance-Leahy Scale: Poor Standing balance comment: cued on her hand grip and posture                            Cognition Arousal/Alertness: Awake/alert Behavior During Therapy: WFL for tasks assessed/performed Overall Cognitive Status: Within Functional Limits for tasks assessed  Exercises General Exercises - Lower Extremity Ankle Circles/Pumps: AROM;5 reps Quad Sets: AROM;10 reps Gluteal Sets: AROM;10 reps    General Comments General comments (skin integrity, edema, etc.): pt is up to walk with help and motivated about trying to go home soon.   Feels comfortable she has enough help to stay around the clock but is in need of this to go that way      Pertinent Vitals/Pain Pain Assessment: Faces Faces Pain Scale: Hurts even more Pain Location: neck, L shoulder Pain Descriptors / Indicators: Discomfort;Grimacing;Guarding Pain Intervention(s): Limited activity within patient's tolerance;Monitored during session;Repositioned    Home Living                      Prior Function            PT Goals (current goals can now be found in the care plan section) Acute Rehab PT Goals Patient Stated Goal: to go home to her cat Progress towards PT goals: Progressing toward goals    Frequency    Min 3X/week      PT Plan Current plan remains appropriate    Co-evaluation              AM-PAC PT "6 Clicks" Mobility   Outcome Measure  Help needed turning from your back to your side while in a flat bed without using bedrails?: A Little Help needed moving from lying on your back to sitting on the side of a flat bed without using bedrails?: A Little Help needed moving to and from a bed to a chair (including a wheelchair)?: A Little Help needed standing up from a chair using your arms (e.g., wheelchair or bedside chair)?: A Little Help needed to walk in hospital room?: A Little Help needed climbing 3-5 steps with a railing? : A Little 6 Click Score: 18    End of Session Equipment Utilized During Treatment: Gait belt Activity Tolerance: Patient limited by pain;Patient tolerated treatment well Patient left: in bed;with call bell/phone within reach;with bed alarm set;with family/visitor present Nurse Communication: Mobility status PT Visit Diagnosis: Muscle weakness (generalized) (M62.81);History of falling (Z91.81);Difficulty in walking, not elsewhere classified (R26.2);Dizziness and giddiness (R42)     Time: 2130-8657 PT Time Calculation (min) (ACUTE ONLY): 30 min  Charges:  $Gait Training: 8-22 mins $Therapeutic  Exercise: 8-22 mins                Ramond Dial 11/21/2020, 4:05 PM  Mee Hives, PT PhD Acute Rehab Dept. Number: [place]

## 2020-11-21 NOTE — Plan of Care (Signed)
Patient progressing 

## 2020-11-22 ENCOUNTER — Inpatient Hospital Stay (HOSPITAL_COMMUNITY): Payer: Medicare Other

## 2020-11-22 DIAGNOSIS — E440 Moderate protein-calorie malnutrition: Secondary | ICD-10-CM | POA: Insufficient documentation

## 2020-11-22 LAB — CBC WITH DIFFERENTIAL/PLATELET
Abs Immature Granulocytes: 0.01 10*3/uL (ref 0.00–0.07)
Basophils Absolute: 0 10*3/uL (ref 0.0–0.1)
Basophils Relative: 0 %
Eosinophils Absolute: 0.4 10*3/uL (ref 0.0–0.5)
Eosinophils Relative: 7 %
HCT: 28.6 % — ABNORMAL LOW (ref 36.0–46.0)
Hemoglobin: 9.1 g/dL — ABNORMAL LOW (ref 12.0–15.0)
Immature Granulocytes: 0 %
Lymphocytes Relative: 30 %
Lymphs Abs: 1.5 10*3/uL (ref 0.7–4.0)
MCH: 28.3 pg (ref 26.0–34.0)
MCHC: 31.8 g/dL (ref 30.0–36.0)
MCV: 89.1 fL (ref 80.0–100.0)
Monocytes Absolute: 0.6 10*3/uL (ref 0.1–1.0)
Monocytes Relative: 11 %
Neutro Abs: 2.5 10*3/uL (ref 1.7–7.7)
Neutrophils Relative %: 52 %
Platelets: 179 10*3/uL (ref 150–400)
RBC: 3.21 MIL/uL — ABNORMAL LOW (ref 3.87–5.11)
RDW: 14.2 % (ref 11.5–15.5)
WBC: 4.8 10*3/uL (ref 4.0–10.5)
nRBC: 0 % (ref 0.0–0.2)

## 2020-11-22 LAB — URINALYSIS, ROUTINE W REFLEX MICROSCOPIC
Bacteria, UA: NONE SEEN
Bilirubin Urine: NEGATIVE
Glucose, UA: NEGATIVE mg/dL
Hgb urine dipstick: NEGATIVE
Ketones, ur: NEGATIVE mg/dL
Nitrite: NEGATIVE
Protein, ur: NEGATIVE mg/dL
Specific Gravity, Urine: 1.008 (ref 1.005–1.030)
pH: 7 (ref 5.0–8.0)

## 2020-11-22 LAB — BASIC METABOLIC PANEL
Anion gap: 4 — ABNORMAL LOW (ref 5–15)
BUN: 10 mg/dL (ref 8–23)
CO2: 28 mmol/L (ref 22–32)
Calcium: 8.3 mg/dL — ABNORMAL LOW (ref 8.9–10.3)
Chloride: 103 mmol/L (ref 98–111)
Creatinine, Ser: 0.53 mg/dL (ref 0.44–1.00)
GFR, Estimated: >60 mL/min (ref >60)
Glucose, Bld: 103 mg/dL — ABNORMAL HIGH (ref 70–99)
Potassium: 4.5 mmol/L (ref 3.5–5.1)
Sodium: 135 mmol/L (ref 135–145)

## 2020-11-22 LAB — SODIUM, URINE, RANDOM: Sodium, Ur: 79 mmol/L

## 2020-11-22 LAB — RAPID URINE DRUG SCREEN, HOSP PERFORMED
Amphetamines: NOT DETECTED
Barbiturates: NOT DETECTED
Benzodiazepines: NOT DETECTED
Cocaine: NOT DETECTED
Opiates: POSITIVE — AB
Tetrahydrocannabinol: NOT DETECTED

## 2020-11-22 LAB — OSMOLALITY, URINE: Osmolality, Ur: 283 mOsm/kg — ABNORMAL LOW (ref 300–900)

## 2020-11-22 NOTE — Plan of Care (Signed)
Patient progressing 

## 2020-11-22 NOTE — Progress Notes (Signed)
PROGRESS NOTE    Hailey [name]  ASN:[id number] DOB: 22-Oct-1940 DOA: 11/18/2020 PCP: Hoyt Koch, MD    Chief Complaint  Patient presents with   Fall    Brief Narrative:  Patient is an 80 year old Caucasian female with past medical history significant for chronic diastolic heart failure, hyperlipidemia and subarachnoid hemorrhage, cathectic , admitted for recurrent syncope and falls. CT head without contrast revealed thin subdural hematoma without mass-effect.  Discussed in detail with the patient about her falls. She reports that 2 years ago she had full complete work up for the recurrent syncope at [facility] but unable to diagnose her.   Assessment & Plan:   Principal Problem:   Syncope Active Problems:   HLD (hyperlipidemia)   Seasonal and perennial allergic rhinitis   INSOMNIA   Closed rib fracture   Subdural hematoma   Acute prerenal azotemia   Chronic diastolic CHF (congestive heart failure) (HCC)   SIRS (systemic inflammatory response syndrome) (HCC)   Malnutrition of moderate degree   Fever/ SIRS Resolved.  Cultures have been negative.  Discontinued antibiotics.    Syncope  probably secondary to orthostatic hypotension - echo is not significant for abnormalities.  - no arrhythmias on telemetry today.  - CT head showed acute sub dural hematoma.  - MRI brain without contrast ordered for further evaluation.  - if MRI is negative, will request cardiology to see if she needs Loop recorder placement.    Hyponatremia:  Resolved.    Chronic diastolic CHF.  - she appears compensated.    Cachexia:  Will get dietary consult.     H/o of IBS as per the patient,  Intermittent episodes of diarrhea with constipation.  No diarrhea since admission.    Chronic bilateral rib fractures ? Recurrent falls.   Subdural hematoma:  Pt alert and oriented, denies any headache today.    Hypokalemia and hypophosphatemia:  Replaced.  Recheck phos  levels in am.   Anemia of chronic disease:  Hemoglobin stable between 9 to 10.    DVT prophylaxis: (SCD'S) Code Status: (Full CODE) Family Communication: (Discussed with daughter over the phone) Disposition:   Status is: Inpatient  Remains inpatient appropriate because:Ongoing diagnostic testing needed not appropriate for outpatient work up Currently waiting for cardiology to evaluate, possible d/c in am.  Dispo: The patient is from: Home              Anticipated d/c is to: Home              Patient currently is not medically stable to d/c.   Difficult to place patient No       Consultants:  None.   Procedures:  Echo MRI brain.   Antimicrobials: none.   Subjective: No diarrhea. No new complaints. Reports feeling back to baseline.   Objective: Vitals:   11/21/20 1600 11/21/20 2227 11/22/20 0528 11/22/20 1147  BP: (!) 115/58 128/63 (!) 152/73 120/63  Pulse: 78 79 67 80  Resp: 20 20 18 18   Temp: 98.1 F (36.7 C) 98.7 F (37.1 C) 97.7 F (36.5 C) 97.8 F (36.6 C)  TempSrc: Oral Oral Oral Oral  SpO2: 98% 96% 97% 95%  Weight:   59.5 kg   Height:        Intake/Output Summary (Last 24 hours) at 11/22/2020 1737 Last data filed at 11/22/2020 1300 Gross per 24 hour  Intake 240 ml  Output 1000 ml  Net -760 ml    Filed Weights   11/18/20 1225  11/20/20 0022 11/22/20 0528  Weight: 57.6 kg 60.1 kg 59.5 kg    Examination:  General exam: Appears calm and comfortable  Respiratory system: Clear to auscultation. Respiratory effort normal. Cardiovascular system: S1 & S2 heard, RRR. No JVD, . No pedal edema. Gastrointestinal system: Abdomen is nondistended, soft and nontender. Normal bowel sounds heard. Central nervous system: Alert and oriented. No focal neurological deficits. Extremities: Symmetric 5 x 5 power. Skin: No rashes, lesions or ulcers Psychiatry: Judgement and insight appear normal. Mood & affect appropriate.      Data Reviewed: I have personally  reviewed following labs and imaging studies  CBC: Recent Labs  Lab 11/18/20 1201 11/19/20 0320 11/20/20 0213 11/22/20 0155  WBC 6.3 7.6 6.4 4.8  NEUTROABS 5.1  --  4.7 2.5  HGB 10.2* 10.9* 9.1* 9.1*  HCT 32.8* 34.4* 28.5* 28.6*  MCV 93.4 90.8 91.1 89.1  PLT 141* 142* 120* 179     Basic Metabolic Panel: Recent Labs  Lab 11/18/20 1201 11/19/20 0320 11/20/20 0213 11/22/20 0155  NA 134* 134* 131* 135  K 4.1 3.6 3.3* 4.5  CL 101 99 100 103  CO2 24 23 24 28   GLUCOSE 105* 133* 106* 103*  BUN 17 12 11 10   CREATININE 0.73 0.78 0.61 0.53  CALCIUM 8.5* 8.7* 7.7* 8.3*  MG  --  1.9 1.8  --   PHOS  --   --  2.2*  --      GFR: Estimated Creatinine Clearance: 52.7 mL/min (by C-G formula based on SCr of 0.53 mg/dL).  Liver Function Tests: Recent Labs  Lab 11/19/20 0320 11/20/20 0213  AST 23  --   ALT 14  --   ALKPHOS 41  --   BILITOT 0.7  --   PROT 6.1*  --   ALBUMIN 3.5 2.8*     CBG: Recent Labs  Lab 11/18/20 1900  GLUCAP 99      Recent Results (from the past 240 hour(s))  Resp Panel by RT-PCR (Flu A&B, Covid) Nasopharyngeal Swab     Status: None   Collection Time: 11/18/20  7:40 PM   Specimen: Nasopharyngeal Swab; Nasopharyngeal(NP) swabs in vial transport medium  Result Value Ref Range Status   SARS Coronavirus 2 by RT PCR NEGATIVE NEGATIVE Final    Comment: (NOTE) SARS-CoV-2 target nucleic acids are NOT DETECTED.  The SARS-CoV-2 RNA is generally detectable in upper respiratory specimens during the acute phase of infection. The lowest concentration of SARS-CoV-2 viral copies this assay can detect is 138 copies/mL. A negative result does not preclude SARS-Cov-2 infection and should not be used as the sole basis for treatment or other patient management decisions. A negative result may occur with  improper specimen collection/handling, submission of specimen other than nasopharyngeal swab, presence of viral mutation(s) within the areas targeted by this  assay, and inadequate number of viral copies(<138 copies/mL). A negative result must be combined with clinical observations, patient history, and epidemiological information. The expected result is Negative.  Fact Sheet for Patients:  EntrepreneurPulse.com.au  Fact Sheet for Healthcare Providers:  IncredibleEmployment.be  This test is no t yet approved or cleared by the [place] FDA and  has been authorized for detection and/or diagnosis of SARS-CoV-2 by FDA under an Emergency Use Authorization (EUA). This EUA will remain  in effect (meaning this test can be used) for the duration of the COVID-19 declaration under Section 564(b)(1) of the Act, 21 U.S.C.section 360bbb-3(b)(1), unless the authorization is terminated  or revoked sooner.  Influenza A by PCR NEGATIVE NEGATIVE Final   Influenza B by PCR NEGATIVE NEGATIVE Final    Comment: (NOTE) The Xpert Xpress SARS-CoV-2/FLU/RSV plus assay is intended as an aid in the diagnosis of influenza from Nasopharyngeal swab specimens and should not be used as a sole basis for treatment. Nasal washings and aspirates are unacceptable for Xpert Xpress SARS-CoV-2/FLU/RSV testing.  Fact Sheet for Patients: EntrepreneurPulse.com.au  Fact Sheet for Healthcare Providers: IncredibleEmployment.be  This test is not yet approved or cleared by the [place] FDA and has been authorized for detection and/or diagnosis of SARS-CoV-2 by FDA under an Emergency Use Authorization (EUA). This EUA will remain in effect (meaning this test can be used) for the duration of the COVID-19 declaration under Section 564(b)(1) of the Act, 21 U.S.C. section 360bbb-3(b)(1), unless the authorization is terminated or revoked.  Performed at [facility], [place] [address] [place], [place] [address]   Culture, blood (routine x 2)     Status: None (Preliminary result)   Collection  Time: 11/19/20  6:41 PM   Specimen: BLOOD LEFT FOREARM  Result Value Ref Range Status   Specimen Description BLOOD LEFT FOREARM  Final   Special Requests IN PEDIATRIC BOTTLE Blood Culture adequate volume  Final   Culture   Final    NO GROWTH 3 DAYS Performed at [facility], [place] [address], [place], [place] [address]    Report Status PENDING  Incomplete  Culture, blood (routine x 2)     Status: None (Preliminary result)   Collection Time: 11/19/20  6:43 PM   Specimen: BLOOD RIGHT ARM  Result Value Ref Range Status   Specimen Description BLOOD RIGHT ARM  Final   Special Requests IN PEDIATRIC BOTTLE Blood Culture adequate volume  Final   Culture   Final    NO GROWTH 3 DAYS Performed at [facility], [place] [address], [place], [place] [address]    Report Status PENDING  Incomplete          Radiology Studies: No results found.      Scheduled Meds:  atorvastatin  40 mg Oral Daily   escitalopram  20 mg Oral Daily   feeding supplement  237 mL Oral BID BM   fluticasone  1 spray Each Nare Daily   gabapentin  300 mg Oral Daily   influenza vaccine adjuvanted  0.5 mL Intramuscular Tomorrow-1000   multivitamin with minerals  1 tablet Oral Daily   Continuous Infusions:     LOS: 3 days        Hosie Poisson, MD [facility]   To contact the attending provider between 7A-7P or the covering provider during after hours 7P-7A, please log into the web site [email or web address] and access using universal [place] password for that web site. If you do not have the password, please call the hospital operator.  11/22/2020, 5:37 PM

## 2020-11-22 NOTE — Progress Notes (Signed)
Patient ID: Hailey [name], female   DOB: 1940-06-05, 80 y.o.   MRN: [id number]   LOS: 3 days   Subjective: Daughter called office worried about infection and wanted to make sure hip was not source. She reported fever of 102 though she's been afebrile with normal WBC since admission. Pt reports hip is doing well and was not the cause of her recent falls which have been do to syncopal events. She does note some more knee pain since the falls but it is minor.   Objective: Vital signs in last 24 hours: Temp:  [97.7 F (36.5 C)-98.7 F (37.1 C)] 97.8 F (36.6 C) (10/11 1147) Pulse Rate:  [67-80] 80 (10/11 1147) Resp:  [18-20] 18 (10/11 1147) BP: (115-152)/(58-73) 120/63 (10/11 1147) SpO2:  [95 %-98 %] 95 % (10/11 1147) Weight:  [59.5 kg] 59.5 kg (10/11 0528) Last BM Date: 11/21/20   Laboratory  CBC Recent Labs    11/20/20 0213 11/22/20 0155  WBC 6.4 4.8  HGB 9.1* 9.1*  HCT 28.5* 28.6*  PLT 120* 179   BMET Recent Labs    11/20/20 0213 11/22/20 0155  NA 131* 135  K 3.3* 4.5  CL 100 103  CO2 24 28  GLUCOSE 106* 103*  BUN 11 10  CREATININE 0.61 0.53  CALCIUM 7.7* 8.3*     Physical Exam General appearance: alert and no distress LLE: Incision C/D/I and well-healed. No erythema or discharge. No TTP. Hip AROM painless with normal ROM. Knee mild diffuse TTP, no obvious effusion.   Assessment/Plan: S/p THA -- No e/o issue with surgery. Continue w/u by medicine. May f/u with Dr. Percell Miller as OP.      Lisette Abu, PA-C Orthopedic Surgery [phone] 11/22/2020

## 2020-11-22 NOTE — Consult Note (Addendum)
Cardiology Consultation:   Patient ID: Hailey [name] MRN: [id number]; DOB: [birth year]  Admit date: 11/18/2020 Date of Consult: 11/22/2020  PCP:  Hoyt Koch, MD   [facility] Providers Cardiologist:  Skeet Latch, MD   Patient Profile:   Hailey [name] is a 80 y.o. female with a hx of HTN, HLD, [place] (2015), alcohol abuse, grade 1 DD, and multiple orthopedic surgeries who is being seen 11/22/2020 for the evaluation of synocpe at the request of Dr. Karleen Hampshire.  History of Present Illness:   [name] was last seen by Dr. Oval Linsey in 2018. She has a history of exertional dyspnea. Echo in 12/2014 with grade 1 DD and mild dilation of the ascending aorta. LVEF was normal with no WMA. Carotid dopplers at that time showed 1-39% stenosis bilaterally. Nuclear stress test was nonischemic. She was noted to have two episodes of syncope in 2017. Symptoms sounded orthostatic in nature. She self-discontinued all BP medications and felt better. She had also had a 20lb intentional weight loss. She has chronic neck pain. Repeat carotid artery [place] in 2018 with mild nonobstructive disease bilaterally.   She was seen by Dr. Virgina Jock 09/09/20 for preoperative clearance for hip surgery. She was told they "couldn't find Dr. Oval Linsey in the system." Per the patient, she considers Dr. Oval Linsey her cardiologist. She underwent hip surgery 10/18/20 and completed 30 day so 81 mg ASA BID for DVT PPX, last dose was Nov 16, 2020.   Per [facility], she was evaluated by [facility] EP for recurrent syncope. In 2021, she as walking into [facility] hospital NPO for surgery and passed out in the hall. Per the patient, workup was negative. She wore a zio patch that showed 17 short episodes of SVT that were asymptomatic and felt unrelated to her syncope.  Dr. Gelene Mink felt her syncopal episodes were vasovagal/orthostatic. She keeps a cup of water by her bed and hydrates throughout the night. She eats out 3 times weekly with friends  likely getting enough sodium. Compression stockings were also recommended. ILR was offered but she declined.   She presented to [facility]  11/18/20 after a syncopal event and fall with head injury at home in the setting of diarrhea. She was orthostatic on arrival: BP 96/52 lying and 62/54 sitting with HR in the low 70s. Afebrile, normal WBC, but with left shift. CT head revealed subdural hematoma without mass effect. Temp was 99.5 on arrival 11/18/20. Initial EKG appears sinus rhythm, nonischemic. CXR showed chronic bilateral rib fractures.   Due to left shift and Temp of 99, cultures were obtained and were negative. She was treated with IVF and orthostasis improved. Hyponatremia of 131 treated. At baseline, she has cachexia with BMI < 20. Dietary consulted.   Brain MRI pending.   Telemetry negative for arrhythmias. Echocardiogram was obtained and showed hyperdynamic LVEF of > 75% with grade 1 DD, and moderate MAC.   Cardiology was consulted for possible heart monitor.   She recounts apparently three episodes of syncope. She went to the bathroom and was surprised to have diarrhea. When she stood, she felt dizzy and lost consciousness hitting her head. When she regained consciousness, she pushed her emergency call button. When EMS arrived, they tried to get her to her bed and she passed out again. Apparently she passed out a third time in the ambulance. She has not had a syncopal episode since last year.  She reports no prodromal symptoms, no warning signs for impending syncope.    Past Medical History:  Diagnosis Date   Allergic rhinitis, cause unspecified    Carotid artery disease ([place]) 12/13/2014   1-39% stenosis of bilateral CCAs.   Repeat ultrasound 11/2016.   Cervical spine fracture (HCC) 02/01/2014   Chronic insomnia    Chronic interstitial cystitis    Closed fracture of rib(s), unspecified    Depression    Disorder of bone and cartilage, unspecified    Esophageal reflux    Injury to  unspecified nerve of pelvic girdle and lower limb(956.9)    Left rotator cuff tear arthropathy 12/10/2017   OSA (obstructive sleep apnea)    Osteoarthrosis, unspecified whether generalized or localized, unspecified site    Other and unspecified hyperlipidemia    Personal history of colonic adenomas 11/28/2012   Personal history of other disorders of nervous system and sense organs    SAH (subarachnoid hemorrhage) ([place]) 02/02/2014   Unspecified closed fracture of pelvis    x 2 2004/2006   Unspecified essential hypertension     Past Surgical History:  Procedure Laterality Date   ANKLE FUSION Left    BACK SURGERY     ; has  had 8 back surgeries   CERVICAL FUSION     C4-7   COLONOSCOPY     CYSTOSCOPY     HERNIA REPAIR     umbilical   LUMBAR FUSION     L2-3   REVERSE SHOULDER ARTHROPLASTY Left 12/10/2017   Procedure: LEFT REVERSE SHOULDER ARTHROPLASTY;  Surgeon: Marchia Bond, MD;  Location: [facility] ORS;  Service: Orthopedics;  Laterality: Left;  Interscalene Block   SACRAL FUSION  07/2012   TIBIA FRACTURE SURGERY Right    TONSILLECTOMY     TOTAL HIP ARTHROPLASTY Right 2012   TOTAL HIP ARTHROPLASTY Left 10/18/2020   Procedure: TOTAL HIP ARTHROPLASTY ANTERIOR APPROACH;  Surgeon: Renette Butters, MD;  Location: [facility] ORS;  Service: Orthopedics;  Laterality: Left;   TUBAL LIGATION     UPPER GASTROINTESTINAL ENDOSCOPY       Home Medications:  Prior to Admission medications   Medication Sig Start Date End Date Taking? Authorizing Provider  acetaminophen (TYLENOL) 500 MG tablet Take 2 tablets (1,000 mg total) by mouth every 8 (eight) hours as needed for mild pain or moderate pain. 10/19/20  Yes Britt Bottom, PA-C  aspirin EC 81 MG tablet Take 1 tablet (81 mg total) by mouth 2 (two) times daily. For DVT prophylaxis for 30 days after surgery. 10/19/20  Yes Gawne, Meghan M, PA-C  atorvastatin (LIPITOR) 40 MG tablet Take 1 tablet (40 mg total) by mouth daily. 09/06/20  Yes Hoyt Koch, MD   denosumab (PROLIA) 60 MG/ML SOLN injection Inject 60 mg into the skin every 6 (six) months. Administer in upper arm, thigh, or abdomen   Yes [provider]  diclofenac sodium (VOLTAREN) 1 % GEL Apply 2 g topically at bedtime.   Yes [provider]  escitalopram (LEXAPRO) 20 MG tablet Take 1 tablet (20 mg total) by mouth daily. 09/06/20  Yes Hoyt Koch, MD  fluticasone [facility]) 50 MCG/ACT nasal spray Use 1 spray each nostril 1-2 times daily as needed Patient taking differently: Place 1 spray into both nostrils 2 (two) times daily. 02/14/16  Yes Bobbitt, [place] Muta, MD  gabapentin (NEURONTIN) 300 MG capsule Take 300 mg by mouth daily.   Yes [provider]  HYDROcodone-acetaminophen (NORCO) 10-325 MG tablet Take 1 tablet by mouth every 6 (six) hours as needed for severe pain. 10/19/20  Yes Britt Bottom, PA-C  loperamide (IMODIUM) 2 MG capsule Take 2 mg by mouth as needed for diarrhea or loose stools.   Yes [provider]  LORazepam (ATIVAN) 1 MG tablet Take 1 tablet (1 mg total) by mouth at bedtime. 09/06/20  Yes Hoyt Koch, MD  meloxicam (MOBIC) 15 MG tablet Take 1 tablet (15 mg total) by mouth daily as needed for pain (and inflammation). 10/19/20  Yes Gawne, Meghan M, PA-C  methocarbamol (ROBAXIN) 500 MG tablet Take 1 tablet (500 mg total) by mouth every 8 (eight) hours as needed for muscle spasms. 10/19/20  Yes Gawne, Meghan M, PA-C  ondansetron (ZOFRAN ODT) 4 MG disintegrating tablet Take 1 tablet (4 mg total) by mouth 2 (two) times daily as needed for nausea or vomiting. 10/19/20  Yes Gawne, Meghan M, PA-C  traMADol-acetaminophen (ULTRACET) 37.5-325 MG tablet Take 1 tablet by mouth 2 (two) times daily as needed. 11/09/20  Yes [provider]  zolpidem (AMBIEN) 5 MG tablet Take 1 tablet (5 mg total) by mouth at bedtime as needed. Patient taking differently: Take 5 mg by mouth at bedtime. 09/06/20  Yes Hoyt Koch, MD   hydrocortisone (ANUSOL-HC) 2.5 % rectal cream Place 1 application rectally 2 (two) times daily. Patient not taking: No sig reported 04/03/18   Hoyt Koch, MD  omeprazole (PRILOSEC OTC) 20 MG tablet Take 1 tablet (20 mg total) by mouth daily. For gastric protection Patient not taking: No sig reported 10/19/20 11/18/20  Britt Bottom, PA-C    Inpatient Medications: Scheduled Meds:  atorvastatin  40 mg Oral Daily   escitalopram  20 mg Oral Daily   feeding supplement  237 mL Oral BID BM   fluticasone  1 spray Each Nare Daily   gabapentin  300 mg Oral Daily   influenza vaccine adjuvanted  0.5 mL Intramuscular Tomorrow-1000   multivitamin with minerals  1 tablet Oral Daily   Continuous Infusions:  PRN Meds: acetaminophen **OR** acetaminophen, alum & mag hydroxide-simeth, diphenhydrAMINE, HYDROcodone-acetaminophen, lip balm, LORazepam, naLOXone (NARCAN)  injection  Allergies:    Allergies  Allergen Reactions   Resorcinol Swelling and Other (See Comments)    blisters    Social History:   Social History   Socioeconomic History   Marital status: Widowed    Spouse name: Not on file   Number of children: Not on file   Years of education: Not on file   Highest education level: Not on file  Occupational History   Occupation: OWNER    Employer: [employer]    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  Tobacco Use   Smoking status: Former    Packs/day: 2.00    Years: 10.00    Pack years: 20.00    Types: Cigarettes    Quit date: 02/13/1967    Years since quitting: 53.8   Smokeless tobacco: Never   Tobacco comments:    smoked 1961-1969 , up to 3 ppd, mainly 2 ppd  Vaping Use   Vaping Use: Never used  Substance and Sexual Activity   Alcohol use: No   Drug use: No   Sexual activity: Never  Other Topics Concern   Not on file  Social History Narrative   Not on file   Social Determinants of Health   Financial Resource Strain: Not on file  Food Insecurity: Not on file   Transportation Needs: Not on file  Physical Activity: Not on file  Stress: Not on file  Social Connections: Not on file  Intimate Partner Violence: Not on  file    Family History:    Family History  Problem Relation Age of Onset   Dementia Mother    Hypertension Father    Heart attack Father    Allergic rhinitis Father    Cancer Maternal Aunt        breast cancer   Alzheimer's disease Maternal Aunt    Stroke Paternal Uncle    Cancer Cousin        breast cancer   Colon cancer Neg Hx    Angioedema Neg Hx    Asthma Neg Hx    Eczema Neg Hx      ROS:  Please see the history of present illness.   All other ROS reviewed and negative.     Physical Exam/Data:   Vitals:   11/21/20 1600 11/21/20 2227 11/22/20 0528 11/22/20 1147  BP: (!) 115/58 128/63 (!) 152/73 120/63  Pulse: 78 79 67 80  Resp: _0 Temp: 98.1 F (36.7 C) 98.7 F (37.1 C) 97.7 F (36.5 C) 97.8 F (36.6 C)  TempSrc: Oral Oral Oral Oral  SpO2: 98% 96% 97% 95%  Weight:   59.5 kg   Height:        Intake/Output Summary (Last 24 hours) at 11/22/2020 1547 Last data filed at 11/22/2020 1300 Gross per 24 hour  Intake 240 ml  Output 1000 ml  Net -760 ml   Last 3 Weights 11/22/2020 11/20/2020 11/18/2020  Weight (lbs) 131 lb 1.6 oz 132 lb 7.9 oz 127 lb  Weight (kg) 59.467 kg 60.1 kg 57.607 kg     Body mass index is 20.53 kg/m.  General:  elderly thin female in NAD HEENT: normal Neck: no JVD Vascular: No carotid bruits; Distal pulses 2+ bilaterally Cardiac:  normal S1, S2; RRR; no murmur  Lungs:  clear to auscultation bilaterally, no wheezing, rhonchi or rales  Abd: soft, nontender, no hepatomegaly  Ext: no edema Musculoskeletal:  No deformities, BUE and BLE strength normal and equal Skin: warm and dry  Neuro:  CNs 2-12 intact, no focal abnormalities noted Psych:  Normal affect   EKG:  The EKG was personally reviewed and demonstrates:  sinus rhythm with HR 84 Telemetry:  Telemetry was  personally reviewed and demonstrates:  sinus rhythm in the 60-80s  Relevant CV Studies:  Echo 11/19/20:  1. Left ventricular ejection fraction, by estimation, is >75%. The left  ventricle has hyperdynamic function. The left ventricle has no regional  wall motion abnormalities. Left ventricular diastolic parameters are  consistent with Grade I diastolic  dysfunction (impaired relaxation).   2. Right ventricular systolic function is normal. The right ventricular  size is normal.   3. The mitral valve is normal in structure. Trivial mitral valve  regurgitation. No evidence of mitral stenosis. Moderate mitral annular  calcification.   4. The aortic valve is normal in structure. Aortic valve regurgitation is  trivial. No aortic stenosis is present.   5. The inferior vena cava is normal in size with greater than 50%  respiratory variability, suggesting right atrial pressure of 3 mmHg.   Laboratory Data:  High Sensitivity Troponin:   Recent Labs  Lab 11/18/20 1201 11/18/20 1454 11/19/20 0320  TROPONINIHS _1 Chemistry Recent Labs  Lab 11/19/20 0320 11/20/20 0213 11/22/20 0155  NA 134* 131* 135  K 3.6 3.3* 4.5  CL 99 100 103  CO2 _2 GLUCOSE 133* 106* 103*  BUN _3 CREATININE  0.78 0.61 0.53  CALCIUM 8.7* 7.7* 8.3*  MG 1.9 1.8  --   GFRNONAA >60 >60 >60  ANIONGAP 12 7 4*    Recent Labs  Lab 11/19/20 0320 11/20/20 0213  PROT 6.1*  --   ALBUMIN 3.5 2.8*  AST 23  --   ALT 14  --   ALKPHOS 41  --   BILITOT 0.7  --    Lipids No results for input(s): CHOL, TRIG, HDL, LABVLDL, LDLCALC, CHOLHDL in the last 168 hours.  Hematology Recent Labs  Lab 11/19/20 0320 11/20/20 0213 11/22/20 0155  WBC 7.6 6.4 4.8  RBC 3.79* 3.13* 3.21*  HGB 10.9* 9.1* 9.1*  HCT 34.4* 28.5* 28.6*  MCV 90.8 91.1 89.1  MCH 28.8 29.1 28.3  MCHC 31.7 31.9 31.8  RDW 14.4 14.3 14.2  PLT 142* 120* 179   Thyroid  Recent Labs  Lab 11/19/20 1843  TSH 1.643  FREET4 0.87     BNPNo results for input(s): BNP, PROBNP in the last 168 hours.  DDimer No results for input(s): DDIMER in the last 168 hours.   Radiology/Studies:  DG Chest 2 View  Result Date: 11/19/2020 CLINICAL DATA:  Fever, congestive heart failure EXAM: CHEST - 2 VIEW COMPARISON:  11/18/2020 FINDINGS: The lungs are symmetrically well expanded and are clear. No pneumothorax or pleural effusion. Rim calcified density overlying the left mid lung zone corresponds to a subcutaneous collection, possibly representing the sequela of remote surgery, better seen on CT examination of 08/18/2015. Cardiac size within normal limits. Stable mediastinal shift to the right. Left total shoulder arthroplasty has been performed. Remote bilateral distal clavicle fractures again noted. Multiple remote bilateral rib fractures again noted. No acute bone abnormality. Extensive spinal fusion hardware again noted. IMPRESSION: No active cardiopulmonary disease. Electronically Signed   By: Fidela Salisbury M.D.   On: 11/19/2020 19:18   ECHOCARDIOGRAM LIMITED  Result Date: 11/19/2020    ECHOCARDIOGRAM REPORT   Patient Name:   MEEYA GOLDIN Date of Exam: 11/19/2020 Medical Rec #:  [id number]     Height:       67.0 in Accession #:    [id number]    Weight:       127.0 lb Date of Birth:  02-22-1940     BSA:          1.667 m Patient Age:    30 years      BP:           115/77 mmHg Patient Gender: F             HR:           80 bpm. Exam Location:  Inpatient Procedure: Limited Echo, Limited Color Doppler and Cardiac Doppler Indications:    syncope  History:        Patient has prior history of Echocardiogram examinations, most                 recent 12/23/2014. Risk Factors:Dyslipidemia.  Sonographer:    Johny Chess RDCS Referring Phys: [id number] Rhetta Mura  Sonographer Comments: Technically difficult study due to poor echo windows, Technically challenging study due to limited acoustic windows, no parasternal window and no apical window. Image  acquisition challenging due to respiratory motion. IMPRESSIONS  1. Left ventricular ejection fraction, by estimation, is >75%. The left ventricle has hyperdynamic function. The left ventricle has no regional wall motion abnormalities. Left ventricular diastolic parameters are consistent with Grade I diastolic dysfunction (impaired relaxation).  2. Right  ventricular systolic function is normal. The right ventricular size is normal.  3. The mitral valve is normal in structure. Trivial mitral valve regurgitation. No evidence of mitral stenosis. Moderate mitral annular calcification.  4. The aortic valve is normal in structure. Aortic valve regurgitation is trivial. No aortic stenosis is present.  5. The inferior vena cava is normal in size with greater than 50% respiratory variability, suggesting right atrial pressure of 3 mmHg. FINDINGS  Left Ventricle: Left ventricular ejection fraction, by estimation, is >75%. The left ventricle has hyperdynamic function. The left ventricle has no regional wall motion abnormalities. The left ventricular internal cavity size was normal in size. There is no left ventricular hypertrophy. Left ventricular diastolic parameters are consistent with Grade I diastolic dysfunction (impaired relaxation). Right Ventricle: The right ventricular size is normal. No increase in right ventricular wall thickness. Right ventricular systolic function is normal. Left Atrium: Left atrial size was normal in size. Right Atrium: Right atrial size was normal in size. Pericardium: There is no evidence of pericardial effusion. Mitral Valve: The mitral valve is normal in structure. Moderate mitral annular calcification. Trivial mitral valve regurgitation. No evidence of mitral valve stenosis. Tricuspid Valve: The tricuspid valve is normal in structure. Tricuspid valve regurgitation is not demonstrated. No evidence of tricuspid stenosis. Aortic Valve: The aortic valve is normal in structure. Aortic valve  regurgitation is trivial. No aortic stenosis is present. Pulmonic Valve: The pulmonic valve was normal in structure. Pulmonic valve regurgitation is not visualized. No evidence of pulmonic stenosis. Aorta: The aortic root is normal in size and structure. Venous: The inferior vena cava is normal in size with greater than 50% respiratory variability, suggesting right atrial pressure of 3 mmHg. IAS/Shunts: No atrial level shunt detected by color flow Doppler.   Diastology LV e' medial:    7.72 cm/s LV E/e' medial:  9.8 LV e' lateral:   8.27 cm/s LV E/e' lateral: 9.2  LEFT ATRIUM           Index        RIGHT ATRIUM           Index LA Vol (A4C): 48.3 ml 28.97 ml/m  RA Area:     11.50 cm                                    RA Volume:   24.10 ml  14.45 ml/m  AORTIC VALVE LVOT Vmax:   90.00 cm/s LVOT Vmean:  60.200 cm/s LVOT VTI:    0.187 m MITRAL VALVE MV Area (PHT): 4.21 cm     SHUNTS MV Decel Time: 180 msec     Systemic VTI: 0.19 m MV E velocity: 75.80 cm/s MV A velocity: 121.00 cm/s MV E/A ratio:  0.63 Candee Furbish MD Electronically signed by Candee Furbish MD Signature Date/Time: 11/19/2020/1:06:22 PM    Final      Assessment and Plan:   Syncope Recurrent falls - I believe she is very sensitive to dehydration - she eats sodium with dining out 3 times weekly - recommend compression stockings and keeping gatorade in the bathroom - I have offered a repeat zio patch, but given her infrequent episodes (none in the past year), I'm not sure this will be revealing - can again discuss possible loop recorder if she has another syncopal episode   PSVT - noted on zio patch lats year - asymptomatic - BB not initiated given her  bouts of orthostasis and polypharmacy   Orthostatic hypotension - BP 152/73 lying --> 135/80 standing - no longer orthosatic   Acute subdural hematoma - per neurosurgery   Carotid artery stenosis - nonobstructive by [place] in 2016 - repeat US in 2018 appeared stable - can opt to  repeat in OP setting   Anemia - Hb 9.1 (10.9) - Hb was 12.4 in Aug 2022 - denies blood with diarrhea - per primary   I will make her follow up with Dr. Oval Linsey.     Risk Assessment/Risk Scores:       For questions or updates, please contact [place] Please consult [email or web address] for contact info under    Signed, Ledora Bottcher, PA  11/22/2020 3:47 PM  History and all data above reviewed.  Patient examined.  I agree with the findings as above.  The patient presents after syncopal episode as documented above.  She had a long history of this.  She clearly was orthostatic and has a history of vasovagal syncope.  She has had work-up with no evidence of dysrhythmia or conduction disturbance.  She said she had been fine up to the point where she developed diarrhea.  She does not describe any palpitations.  She has had no chest pressure, neck or arm discomfort.  She lives independently.  The patient exam reveals COR: Regular rate and rhythm, no murmurs,  Lungs: Clear to auscultation bilaterally,  Abd: Positive bowel sounds normal in frequency and pitch, no rebound, guarding, Ext 2+ pulses, no edema..  All available labs, radiology testing, previous records reviewed. Agree with documented assessment and plan.  Syncope: Patient clearly had orthostasis.  She has had extensive previous work-up.  There been no arrhythmias on telemetry.  She has improved with hydration.  Most recent check was not orthostatic.  Echocardiogram was unremarkable.  At this point I do not think an arrhythmia is a high likelihood and she would not want an implanted monitor.  She is already 1 external monitor.  Rather we talked about conservative measures.  She does not want to use compression garments.  She does agree to salt load and to hydrate with sports drinks.  Work-up of her anemia will be per the primary team.  We certainly will arrange follow-up as she prefers with Dr. Oval Linsey.Minus Breeding  6:36 PM   11/22/2020

## 2020-11-23 DIAGNOSIS — I10 Essential (primary) hypertension: Secondary | ICD-10-CM

## 2020-11-23 DIAGNOSIS — E440 Moderate protein-calorie malnutrition: Secondary | ICD-10-CM

## 2020-11-23 LAB — BASIC METABOLIC PANEL
Anion gap: 6 (ref 5–15)
BUN: 11 mg/dL (ref 8–23)
CO2: 29 mmol/L (ref 22–32)
Calcium: 8.8 mg/dL — ABNORMAL LOW (ref 8.9–10.3)
Chloride: 97 mmol/L — ABNORMAL LOW (ref 98–111)
Creatinine, Ser: 0.63 mg/dL (ref 0.44–1.00)
GFR, Estimated: >60 mL/min (ref >60)
Glucose, Bld: 108 mg/dL — ABNORMAL HIGH (ref 70–99)
Potassium: 4.4 mmol/L (ref 3.5–5.1)
Sodium: 132 mmol/L — ABNORMAL LOW (ref 135–145)

## 2020-11-23 LAB — PHOSPHORUS: Phosphorus: 3 mg/dL (ref 2.5–4.6)

## 2020-11-23 MED ORDER — COVID-19MRNA BIVAL VACC PFIZER 30 MCG/0.3ML IM SUSP
0.3000 mL | Freq: Once | INTRAMUSCULAR | Status: DC
  Filled 2020-11-23: qty 0.3

## 2020-11-23 NOTE — Discharge Summary (Signed)
Physician Discharge Summary  Hailey [name] DUK:[id number] DOB: 1940/06/28 DOA: 11/18/2020  PCP: Hoyt Koch, MD  Admit date: 11/18/2020 Discharge date: 11/23/2020  Admitted From: Home Disposition: Home with home health  Recommendations for Outpatient Follow-up:  Follow up with PCP in 1-2 weeks Please obtain BMP/CBC in one week All-time orthostatic precautions.  Home Health: PT/OT.  Patient declined. Equipment/Devices: None.  Discharge Condition: Stable. CODE STATUS: Full code Diet recommendation: Regular diet  Discharge summary: 80 year old with history of chronic diastolic heart failure, hyperlipidemia and subarachnoid hemorrhage admitted with recurrent syncope and fall.  Skeletal survey negative.  She does have IBS and has chronic diarrhea.  Fall was associated with episode of bowel movement and syncope.  Recent extensive investigation as outpatient and positive for orthostatic but no arrhythmias were found.  Syncope probably secondary to orthostatic hypotension: Normal echocardiogram.  No arrhythmias on telemetry.  CT head showed acute subdural hematoma.  MRI with a stable hematoma, not requiring any intervention. Positive orthostatic with some dizziness.  Supine blood pressures 160.  30-40 point drop in systolic blood pressure.  Not a candidate for midodrine because of supine hypertension.  Compression stockings.  Abdominal binder before getting out of the bed.  Orthostatic precautions all the time.  Electrolytes are adequate.  No change in therapy.  Stable for discharge home.  Discharge Diagnoses:  Principal Problem:   Syncope Active Problems:   HLD (hyperlipidemia)   Seasonal and perennial allergic rhinitis   INSOMNIA   Closed rib fracture   Subdural hematoma   Acute prerenal azotemia   Chronic diastolic CHF (congestive heart failure) (HCC)   SIRS (systemic inflammatory response syndrome) (HCC)   Malnutrition of moderate degree    Discharge  Instructions  Discharge Instructions     Call MD for:  extreme fatigue   Complete by: As directed    Call MD for:  persistant dizziness or light-headedness   Complete by: As directed    Diet general   Complete by: As directed    Discharge instructions   Complete by: As directed    Hydrate yourself well. Use imodium for diarrhea  Orthostatic precautions all the time , fall precautions   Increase activity slowly   Complete by: As directed       Allergies as of 11/23/2020       Reactions   Resorcinol Swelling, Other (See Comments)   blisters        Medication List     STOP taking these medications    hydrocortisone 2.5 % rectal cream Commonly known as: Anusol-HC   omeprazole 20 MG tablet Commonly known as: PriLOSEC OTC       TAKE these medications    acetaminophen 500 MG tablet Commonly known as: TYLENOL Take 2 tablets (1,000 mg total) by mouth every 8 (eight) hours as needed for mild pain or moderate pain.   aspirin EC 81 MG tablet Take 1 tablet (81 mg total) by mouth 2 (two) times daily. For DVT prophylaxis for 30 days after surgery.   atorvastatin 40 MG tablet Commonly known as: LIPITOR Take 1 tablet (40 mg total) by mouth daily.   denosumab 60 MG/ML Soln injection Commonly known as: PROLIA Inject 60 mg into the skin every 6 (six) months. Administer in upper arm, thigh, or abdomen   diclofenac sodium 1 % Gel Commonly known as: VOLTAREN Apply 2 g topically at bedtime.   escitalopram 20 MG tablet Commonly known as: LEXAPRO Take 1 tablet (20 mg total) by mouth  daily.   fluticasone 50 MCG/ACT nasal spray Commonly known as: FLONASE Use 1 spray each nostril 1-2 times daily as needed What changed:  how much to take how to take this when to take this additional instructions   gabapentin 300 MG capsule Commonly known as: NEURONTIN Take 300 mg by mouth daily.   HYDROcodone-acetaminophen 10-325 MG tablet Commonly known as: Norco Take 1 tablet by  mouth every 6 (six) hours as needed for severe pain.   loperamide 2 MG capsule Commonly known as: IMODIUM Take 2 mg by mouth as needed for diarrhea or loose stools.   LORazepam 1 MG tablet Commonly known as: ATIVAN Take 1 tablet (1 mg total) by mouth at bedtime.   meloxicam 15 MG tablet Commonly known as: MOBIC Take 1 tablet (15 mg total) by mouth daily as needed for pain (and inflammation).   methocarbamol 500 MG tablet Commonly known as: Robaxin Take 1 tablet (500 mg total) by mouth every 8 (eight) hours as needed for muscle spasms.   ondansetron 4 MG disintegrating tablet Commonly known as: Zofran ODT Take 1 tablet (4 mg total) by mouth 2 (two) times daily as needed for nausea or vomiting.   traMADol-acetaminophen 37.5-325 MG tablet Commonly known as: ULTRACET Take 1 tablet by mouth 2 (two) times daily as needed.   zolpidem 5 MG tablet Commonly known as: AMBIEN Take 1 tablet (5 mg total) by mouth at bedtime as needed. What changed: when to take this        Follow-up Information     Hoyt Koch, MD Follow up in 1 week(s).   Specialty: Internal Medicine Contact information: [place] [address] [phone]         Skeet Latch, MD .   Specialty: Cardiology Contact information: [address] [place] [address] [phone]         [facility], [facility] Follow up.   Specialty: Home Health Services Why: HHPT Contact information: [place] [address] [phone]                Allergies  Allergen Reactions   Resorcinol Swelling and Other (See Comments)    blisters    Consultations: Cardiology   Procedures/Studies: DG Chest 2 View  Result Date: 11/19/2020 CLINICAL DATA:  Fever, congestive heart failure EXAM: CHEST - 2 VIEW COMPARISON:  11/18/2020 FINDINGS: The lungs are symmetrically well expanded and are clear. No pneumothorax or pleural effusion. Rim  calcified density overlying the left mid lung zone corresponds to a subcutaneous collection, possibly representing the sequela of remote surgery, better seen on CT examination of 08/18/2015. Cardiac size within normal limits. Stable mediastinal shift to the right. Left total shoulder arthroplasty has been performed. Remote bilateral distal clavicle fractures again noted. Multiple remote bilateral rib fractures again noted. No acute bone abnormality. Extensive spinal fusion hardware again noted. IMPRESSION: No active cardiopulmonary disease. Electronically Signed   By: Fidela Salisbury M.D.   On: 11/19/2020 19:18   CT HEAD WO CONTRAST (5MM)  Result Date: 11/18/2020 CLINICAL DATA:  Syncopal episode.  Head trauma. EXAM: CT HEAD WITHOUT CONTRAST CT CERVICAL SPINE WITHOUT CONTRAST TECHNIQUE: Multidetector CT imaging of the head and cervical spine was performed following the standard protocol without intravenous contrast. Multiplanar CT image reconstructions of the cervical spine were also generated. COMPARISON:  CT head without contrast 08/18/2015. CT cervical spine without contrast 08/25/2015 FINDINGS: CT HEAD FINDINGS Brain: A thin isodense to slightly hyperdense  extra-axial collection is present on the left. This is best seen on the coronal images. No adjacent fracture is present. No significant mass effect is present. Moderate atrophy is present. Extensive white matter disease is noted. Basal ganglia are intact. None no cortical infarct is present. The brainstem and cerebellum are within normal limits. Vascular: No hyperdense vessel or unexpected calcification. Skull: Calvarium is intact. No focal lytic or blastic lesions are present. A high left parietal scalp laceration and hematoma is present without an underlying fracture. Sinuses/Orbits: The paranasal sinuses and mastoid air cells are clear. The globes and orbits are within normal limits. CT CERVICAL SPINE FINDINGS Alignment: Fused retrolisthesis is present at  C5-6 and C6-7. Slight anterolisthesis at C3-4 is fused. Hard collar is in place. Skull base and vertebrae: Craniocervical junction within normal limits. Moderate degenerative changes present at C1-2. C2 lateral mass screws are present bilaterally. Cervical spine is fused posteriorly C2-C6. Wide laminectomy present at C5 and C6. Soft tissues and spinal canal: Soft tissues of the neck are unremarkable. Disc levels: Facet hypertrophy contributes to a multilevel foraminal narrowing at C3-4 fall with C4-5, C5-6, and C6-7. Wide laminectomy present at C5 and C6. Upper chest: The lung apices are clear. IMPRESSION: 1. Thin isodense to slightly hyperdense extra-axial collection on the left consistent with a thin subdural hematoma. No significant mass effect is present. 2. High left parietal scalp laceration and hematoma without underlying fracture. 3. Multilevel degenerative changes of the cervical spine without acute fracture or traumatic subluxation. 4. Posterior fusion C2-C6 with wide laminectomy at C5 and C6. These results were called by telephone at the time of interpretation on 11/18/2020 at 3:38pm to provider [employer] , who verbally acknowledged these results. Electronically Signed   By: San Morelle M.D.   On: 11/18/2020 15:54   CT Cervical Spine Wo Contrast  Result Date: 11/18/2020 CLINICAL DATA:  Syncopal episode.  Head trauma. EXAM: CT HEAD WITHOUT CONTRAST CT CERVICAL SPINE WITHOUT CONTRAST TECHNIQUE: Multidetector CT imaging of the head and cervical spine was performed following the standard protocol without intravenous contrast. Multiplanar CT image reconstructions of the cervical spine were also generated. COMPARISON:  CT head without contrast 08/18/2015. CT cervical spine without contrast 08/25/2015 FINDINGS: CT HEAD FINDINGS Brain: A thin isodense to slightly hyperdense extra-axial collection is present on the left. This is best seen on the coronal images. No adjacent fracture is present. No  significant mass effect is present. Moderate atrophy is present. Extensive white matter disease is noted. Basal ganglia are intact. None no cortical infarct is present. The brainstem and cerebellum are within normal limits. Vascular: No hyperdense vessel or unexpected calcification. Skull: Calvarium is intact. No focal lytic or blastic lesions are present. A high left parietal scalp laceration and hematoma is present without an underlying fracture. Sinuses/Orbits: The paranasal sinuses and mastoid air cells are clear. The globes and orbits are within normal limits. CT CERVICAL SPINE FINDINGS Alignment: Fused retrolisthesis is present at C5-6 and C6-7. Slight anterolisthesis at C3-4 is fused. Hard collar is in place. Skull base and vertebrae: Craniocervical junction within normal limits. Moderate degenerative changes present at C1-2. C2 lateral mass screws are present bilaterally. Cervical spine is fused posteriorly C2-C6. Wide laminectomy present at C5 and C6. Soft tissues and spinal canal: Soft tissues of the neck are unremarkable. Disc levels: Facet hypertrophy contributes to a multilevel foraminal narrowing at C3-4 fall with C4-5, C5-6, and C6-7. Wide laminectomy present at C5 and C6. Upper chest: The lung apices are clear.  IMPRESSION: 1. Thin isodense to slightly hyperdense extra-axial collection on the left consistent with a thin subdural hematoma. No significant mass effect is present. 2. High left parietal scalp laceration and hematoma without underlying fracture. 3. Multilevel degenerative changes of the cervical spine without acute fracture or traumatic subluxation. 4. Posterior fusion C2-C6 with wide laminectomy at C5 and C6. These results were called by telephone at the time of interpretation on 11/18/2020 at 3:38pm to provider [facility] , who verbally acknowledged these results. Electronically Signed   By: San Morelle M.D.   On: 11/18/2020 15:54   MR BRAIN WO CONTRAST  Result Date:  11/22/2020 CLINICAL DATA:  Recurrent syncope.  Intracranial hemorrhage. EXAM: MRI HEAD WITHOUT CONTRAST TECHNIQUE: Multiplanar, multiecho pulse sequences of the brain and surrounding structures were obtained without intravenous contrast. COMPARISON:  Head CT 11/18/2020 FINDINGS: Brain: There is a small amount of subdural blood over the left hemisphere. No acute infarct. Hyperintense T2-weighted signal is moderately widespread throughout the white matter. Generalized volume loss without a clear lobar predilection. The midline structures are normal. Vascular: Major flow voids are preserved. Skull and upper cervical spine: Normal calvarium and skull base. Visualized upper cervical spine and soft tissues are normal. Sinuses/Orbits:No paranasal sinus fluid levels or advanced mucosal thickening. No mastoid or middle ear effusion. Normal orbits. IMPRESSION: 1. Small amount of subdural blood over the left hemisphere, unchanged. No mass effect or other acute intracranial abnormality. 2. Moderate chronic small vessel ischemic disease and generalized volume loss. Electronically Signed   By: Ulyses Jarred M.D.   On: 11/22/2020 21:17   DG Chest Portable 1 View  Result Date: 11/18/2020 CLINICAL DATA:  Syncope EXAM: PORTABLE CHEST 1 VIEW COMPARISON:  Chest x-ray dated November 17, 2013; CT chest dated July 19, 2015 FINDINGS: Cardiac and mediastinal contours are unchanged. No focal opacity. No evidence of large pleural effusion or pneumothorax. Calcified structure projecting over the left hemithorax, was present on prior 2017 CT and is located in the anterior chest wall soft tissues. Prior posterior fusion of the thoracic spine. Bilateral rib fractures. IMPRESSION: No focal opacity, large pleural effusion or evidence of pneumothorax. Bilateral rib fractures which are likely chronic. Correlate for chest wall tenderness. Calcified structure projecting over the left hemithorax was present on prior 2017 CT and is located in the  anterior chest wall soft tissues. Electronically Signed   By: Yetta Glassman M.D.   On: 11/18/2020 14:37   ECHOCARDIOGRAM LIMITED  Result Date: 11/19/2020    ECHOCARDIOGRAM REPORT   Patient Name:   ALLISHA HARTER Date of Exam: 11/19/2020 Medical Rec #:  [id number]     Height:       67.0 in Accession #:    [id number]    Weight:       127.0 lb Date of Birth:  10-10-1940     BSA:          1.667 m Patient Age:    4 years      BP:           115/77 mmHg Patient Gender: F             HR:           80 bpm. Exam Location:  Inpatient Procedure: Limited Echo, Limited Color Doppler and Cardiac Doppler Indications:    syncope  History:        Patient has prior history of Echocardiogram examinations, most  recent 12/23/2014. Risk Factors:Dyslipidemia.  Sonographer:    Johny Chess RDCS Referring Phys: [id number] Rhetta Mura  Sonographer Comments: Technically difficult study due to poor echo windows, Technically challenging study due to limited acoustic windows, no parasternal window and no apical window. Image acquisition challenging due to respiratory motion. IMPRESSIONS  1. Left ventricular ejection fraction, by estimation, is >75%. The left ventricle has hyperdynamic function. The left ventricle has no regional wall motion abnormalities. Left ventricular diastolic parameters are consistent with Grade I diastolic dysfunction (impaired relaxation).  2. Right ventricular systolic function is normal. The right ventricular size is normal.  3. The mitral valve is normal in structure. Trivial mitral valve regurgitation. No evidence of mitral stenosis. Moderate mitral annular calcification.  4. The aortic valve is normal in structure. Aortic valve regurgitation is trivial. No aortic stenosis is present.  5. The inferior vena cava is normal in size with greater than 50% respiratory variability, suggesting right atrial pressure of 3 mmHg. FINDINGS  Left Ventricle: Left ventricular ejection fraction, by  estimation, is >75%. The left ventricle has hyperdynamic function. The left ventricle has no regional wall motion abnormalities. The left ventricular internal cavity size was normal in size. There is no left ventricular hypertrophy. Left ventricular diastolic parameters are consistent with Grade I diastolic dysfunction (impaired relaxation). Right Ventricle: The right ventricular size is normal. No increase in right ventricular wall thickness. Right ventricular systolic function is normal. Left Atrium: Left atrial size was normal in size. Right Atrium: Right atrial size was normal in size. Pericardium: There is no evidence of pericardial effusion. Mitral Valve: The mitral valve is normal in structure. Moderate mitral annular calcification. Trivial mitral valve regurgitation. No evidence of mitral valve stenosis. Tricuspid Valve: The tricuspid valve is normal in structure. Tricuspid valve regurgitation is not demonstrated. No evidence of tricuspid stenosis. Aortic Valve: The aortic valve is normal in structure. Aortic valve regurgitation is trivial. No aortic stenosis is present. Pulmonic Valve: The pulmonic valve was normal in structure. Pulmonic valve regurgitation is not visualized. No evidence of pulmonic stenosis. Aorta: The aortic root is normal in size and structure. Venous: The inferior vena cava is normal in size with greater than 50% respiratory variability, suggesting right atrial pressure of 3 mmHg. IAS/Shunts: No atrial level shunt detected by color flow Doppler.   Diastology LV e' medial:    7.72 cm/s LV E/e' medial:  9.8 LV e' lateral:   8.27 cm/s LV E/e' lateral: 9.2  LEFT ATRIUM           Index        RIGHT ATRIUM           Index LA Vol (A4C): 48.3 ml 28.97 ml/m  RA Area:     11.50 cm                                    RA Volume:   24.10 ml  14.45 ml/m  AORTIC VALVE LVOT Vmax:   90.00 cm/s LVOT Vmean:  60.200 cm/s LVOT VTI:    0.187 m MITRAL VALVE MV Area (PHT): 4.21 cm     SHUNTS MV Decel Time:  180 msec     Systemic VTI: 0.19 m MV E velocity: 75.80 cm/s MV A velocity: 121.00 cm/s MV E/A ratio:  0.63 Candee Furbish MD Electronically signed by Candee Furbish MD Signature Date/Time: 11/19/2020/1:06:22 PM    Final    (Echo,  Carotid, EGD, Colonoscopy, ERCP)    Subjective: Patient was seen and examined.  Her caretaker was at the bedside. Patient earlier work with physical therapy and she had 30 point drop in systolic blood pressure and had some dizziness when trying to get up from the commode.  Did not pass out.  Discussed extensively about orthostatic precautions and patient is very comfortable.  Has adequate support system at home.   Discharge Exam: Vitals:   11/23/20 0459 11/23/20 1102  BP: (!) 143/78 111/65  Pulse: 72 79  Resp: 20 16  Temp: 97.9 F (36.6 C) 98.3 F (36.8 C)  SpO2: 95% 95%   Vitals:   11/22/20 1147 11/22/20 2253 11/23/20 0459 11/23/20 1102  BP: 120/63 (!) 143/78 (!) 143/78 111/65  Pulse: 80 70 72 79  Resp: 18 16 20 16   Temp: 97.8 F (36.6 C) 98.6 F (37 C) 97.9 F (36.6 C) 98.3 F (36.8 C)  TempSrc: Oral Oral Oral Oral  SpO2: 95% 97% 95% 95%  Weight:   58.5 kg   Height:        General: Pt is alert, awake, not in acute distress Cardiovascular: RRR, S1/S2 +, no rubs, no gallops Respiratory: CTA bilaterally, no wheezing, no rhonchi Abdominal: Soft, NT, ND, bowel sounds + Extremities: no edema, no cyanosis    The results of significant diagnostics from this hospitalization (including imaging, microbiology, ancillary and laboratory) are listed below for reference.     Microbiology: Recent Results (from the past 240 hour(s))  Resp Panel by RT-PCR (Flu A&B, Covid) Nasopharyngeal Swab     Status: None   Collection Time: 11/18/20  7:40 PM   Specimen: Nasopharyngeal Swab; Nasopharyngeal(NP) swabs in vial transport medium  Result Value Ref Range Status   SARS Coronavirus 2 by RT PCR NEGATIVE NEGATIVE Final    Comment: (NOTE) SARS-CoV-2 target nucleic acids  are NOT DETECTED.  The SARS-CoV-2 RNA is generally detectable in upper respiratory specimens during the acute phase of infection. The lowest concentration of SARS-CoV-2 viral copies this assay can detect is 138 copies/mL. A negative result does not preclude SARS-Cov-2 infection and should not be used as the sole basis for treatment or other patient management decisions. A negative result may occur with  improper specimen collection/handling, submission of specimen other than nasopharyngeal swab, presence of viral mutation(s) within the areas targeted by this assay, and inadequate number of viral copies(<138 copies/mL). A negative result must be combined with clinical observations, patient history, and epidemiological information. The expected result is Negative.  Fact Sheet for Patients:  EntrepreneurPulse.com.au  Fact Sheet for Healthcare Providers:  IncredibleEmployment.be  This test is no t yet approved or cleared by the [place] FDA and  has been authorized for detection and/or diagnosis of SARS-CoV-2 by FDA under an Emergency Use Authorization (EUA). This EUA will remain  in effect (meaning this test can be used) for the duration of the COVID-19 declaration under Section 564(b)(1) of the Act, 21 U.S.C.section 360bbb-3(b)(1), unless the authorization is terminated  or revoked sooner.       Influenza A by PCR NEGATIVE NEGATIVE Final   Influenza B by PCR NEGATIVE NEGATIVE Final    Comment: (NOTE) The Xpert Xpress SARS-CoV-2/FLU/RSV plus assay is intended as an aid in the diagnosis of influenza from Nasopharyngeal swab specimens and should not be used as a sole basis for treatment. Nasal washings and aspirates are unacceptable for Xpert Xpress SARS-CoV-2/FLU/RSV testing.  Fact Sheet for Patients: EntrepreneurPulse.com.au  Fact Sheet for Healthcare Providers:  IncredibleEmployment.be  This test is  not yet approved or cleared by the [place] and has been authorized for detection and/or diagnosis of SARS-CoV-2 by FDA under an Emergency Use Authorization (EUA). This EUA will remain in effect (meaning this test can be used) for the duration of the COVID-19 declaration under Section 564(b)(1) of the Act, 21 U.S.C. section 360bbb-3(b)(1), unless the authorization is terminated or revoked.  Performed at [facility], [place] [address] [place], [place] [address]   Culture, blood (routine x 2)     Status: None (Preliminary result)   Collection Time: 11/19/20  6:41 PM   Specimen: BLOOD LEFT FOREARM  Result Value Ref Range Status   Specimen Description BLOOD LEFT FOREARM  Final   Special Requests IN PEDIATRIC BOTTLE Blood Culture adequate volume  Final   Culture   Final    NO GROWTH 4 DAYS Performed at [facility], [place] [address] [place], [place] [address]    Report Status PENDING  Incomplete  Culture, blood (routine x 2)     Status: None (Preliminary result)   Collection Time: 11/19/20  6:43 PM   Specimen: BLOOD RIGHT ARM  Result Value Ref Range Status   Specimen Description BLOOD RIGHT ARM  Final   Special Requests IN PEDIATRIC BOTTLE Blood Culture adequate volume  Final   Culture   Final    NO GROWTH 4 DAYS Performed at [facility], [place] [address], [place], [place] [address]    Report Status PENDING  Incomplete     Labs: BNP (last 3 results) No results for input(s): BNP in the last 8760 hours. Basic Metabolic Panel: Recent Labs  Lab 11/18/20 1201 11/19/20 0320 11/20/20 0213 11/22/20 0155 11/23/20 0306  NA 134* 134* 131* 135 132*  K 4.1 3.6 3.3* 4.5 4.4  CL 101 99 100 103 97*  CO2 24 23 24 28 29   GLUCOSE 105* 133* 106* 103* 108*  BUN 17 12 11 10 11   CREATININE 0.73 0.78 0.61 0.53 0.63  CALCIUM 8.5* 8.7* 7.7* 8.3* 8.8*  MG  --  1.9 1.8  --   --   PHOS  --   --  2.2*  --  3.0   Liver Function Tests: Recent Labs  Lab 11/19/20 0320  11/20/20 0213  AST 23  --   ALT 14  --   ALKPHOS 41  --   BILITOT 0.7  --   PROT 6.1*  --   ALBUMIN 3.5 2.8*   No results for input(s): LIPASE, AMYLASE in the last 168 hours. No results for input(s): AMMONIA in the last 168 hours. CBC: Recent Labs  Lab 11/18/20 1201 11/19/20 0320 11/20/20 0213 11/22/20 0155  WBC 6.3 7.6 6.4 4.8  NEUTROABS 5.1  --  4.7 2.5  HGB 10.2* 10.9* 9.1* 9.1*  HCT 32.8* 34.4* 28.5* 28.6*  MCV 93.4 90.8 91.1 89.1  PLT 141* 142* 120* 179   Cardiac Enzymes: No results for input(s): CKTOTAL, CKMB, CKMBINDEX, TROPONINI in the last 168 hours. BNP: Invalid input(s): POCBNP CBG: Recent Labs  Lab 11/18/20 1900  GLUCAP 99   D-Dimer No results for input(s): DDIMER in the last 72 hours. Hgb A1c No results for input(s): HGBA1C in the last 72 hours. Lipid Profile No results for input(s): CHOL, HDL, LDLCALC, TRIG, CHOLHDL, LDLDIRECT in the last 72 hours. Thyroid function studies No results for input(s): TSH, T4TOTAL, T3FREE, THYROIDAB in the last 72 hours.  Invalid input(s): FREET3 Anemia work  up No results for input(s): VITAMINB12, FOLATE, FERRITIN, TIBC, IRON, RETICCTPCT in the last 72 hours. Urinalysis    Component Value Date/Time   COLORURINE STRAW (A) 11/22/2020 0022   APPEARANCEUR CLEAR 11/22/2020 0022   LABSPEC 1.008 11/22/2020 0022   PHURINE 7.0 11/22/2020 0022   GLUCOSEU NEGATIVE 11/22/2020 0022   HGBUR NEGATIVE 11/22/2020 0022   HGBUR large 06/07/2008 1455   BILIRUBINUR NEGATIVE 11/22/2020 0022   KETONESUR NEGATIVE 11/22/2020 0022   PROTEINUR NEGATIVE 11/22/2020 0022   UROBILINOGEN 0.2 08/03/2014 0509   NITRITE NEGATIVE 11/22/2020 0022   LEUKOCYTESUR MODERATE (A) 11/22/2020 0022   Sepsis Labs Invalid input(s): PROCALCITONIN,  WBC,  LACTICIDVEN Microbiology Recent Results (from the past 240 hour(s))  Resp Panel by RT-PCR (Flu A&B, Covid) Nasopharyngeal Swab     Status: None   Collection Time: 11/18/20  7:40 PM   Specimen:  Nasopharyngeal Swab; Nasopharyngeal(NP) swabs in vial transport medium  Result Value Ref Range Status   SARS Coronavirus 2 by RT PCR NEGATIVE NEGATIVE Final    Comment: (NOTE) SARS-CoV-2 target nucleic acids are NOT DETECTED.  The SARS-CoV-2 RNA is generally detectable in upper respiratory specimens during the acute phase of infection. The lowest concentration of SARS-CoV-2 viral copies this assay can detect is 138 copies/mL. A negative result does not preclude SARS-Cov-2 infection and should not be used as the sole basis for treatment or other patient management decisions. A negative result may occur with  improper specimen collection/handling, submission of specimen other than nasopharyngeal swab, presence of viral mutation(s) within the areas targeted by this assay, and inadequate number of viral copies(<138 copies/mL). A negative result must be combined with clinical observations, patient history, and epidemiological information. The expected result is Negative.  Fact Sheet for Patients:  EntrepreneurPulse.com.au  Fact Sheet for Healthcare Providers:  IncredibleEmployment.be  This test is no t yet approved or cleared by the [place] FDA and  has been authorized for detection and/or diagnosis of SARS-CoV-2 by FDA under an Emergency Use Authorization (EUA). This EUA will remain  in effect (meaning this test can be used) for the duration of the COVID-19 declaration under Section 564(b)(1) of the Act, 21 U.S.C.section 360bbb-3(b)(1), unless the authorization is terminated  or revoked sooner.       Influenza A by PCR NEGATIVE NEGATIVE Final   Influenza B by PCR NEGATIVE NEGATIVE Final    Comment: (NOTE) The Xpert Xpress SARS-CoV-2/FLU/RSV plus assay is intended as an aid in the diagnosis of influenza from Nasopharyngeal swab specimens and should not be used as a sole basis for treatment. Nasal washings and aspirates are unacceptable for  Xpert Xpress SARS-CoV-2/FLU/RSV testing.  Fact Sheet for Patients: EntrepreneurPulse.com.au  Fact Sheet for Healthcare Providers: IncredibleEmployment.be  This test is not yet approved or cleared by the [place] FDA and has been authorized for detection and/or diagnosis of SARS-CoV-2 by FDA under an Emergency Use Authorization (EUA). This EUA will remain in effect (meaning this test can be used) for the duration of the COVID-19 declaration under Section 564(b)(1) of the Act, 21 U.S.C. section 360bbb-3(b)(1), unless the authorization is terminated or revoked.  Performed at [facility], [place] [address], [place], [place] [address]   Culture, blood (routine x 2)     Status: None (Preliminary result)   Collection Time: 11/19/20  6:41 PM   Specimen: BLOOD LEFT FOREARM  Result Value Ref Range Status   Specimen Description BLOOD LEFT FOREARM  Final   Special Requests IN PEDIATRIC BOTTLE Blood  Culture adequate volume  Final   Culture   Final    NO GROWTH 4 DAYS Performed at [facility], [place] [address] [place], [place] [address]    Report Status PENDING  Incomplete  Culture, blood (routine x 2)     Status: None (Preliminary result)   Collection Time: 11/19/20  6:43 PM   Specimen: BLOOD RIGHT ARM  Result Value Ref Range Status   Specimen Description BLOOD RIGHT ARM  Final   Special Requests IN PEDIATRIC BOTTLE Blood Culture adequate volume  Final   Culture   Final    NO GROWTH 4 DAYS Performed at [facility], [place] [address] [place], [place] [address]    Report Status PENDING  Incomplete     Time coordinating discharge: 28 minutes  SIGNED:   Barb Merino, MD  [facility] 11/23/2020, 2:51 PM

## 2020-11-23 NOTE — Progress Notes (Signed)
   11/23/20 1400  Mobility  Activity Refused mobility (Declined d/t getting discharged)

## 2020-11-23 NOTE — Progress Notes (Signed)
    Subjective: Patient is 5 weeks out from a Left THA. She has been doing well in her recovery until she fell at home last week during a syncopal episode. She reports hitting her head. Her daughter called our office to notify us that the patient was in the hospital after falling so we wanted to check on her. No pain in the left hip. No n/t. She has been doing PT on her own at home.   Objective:   VITALS:   Vitals:   11/22/20 1147 11/22/20 2253 11/23/20 0459 11/23/20 1102  BP: 120/63 (!) 143/78 (!) 143/78 111/65  Pulse: 80 70 72 79  Resp: 18 16 20 16   Temp: 97.8 F (36.6 C) 98.6 F (37 C) 97.9 F (36.6 C) 98.3 F (36.8 C)  TempSrc: Oral Oral Oral Oral  SpO2: 95% 97% 95% 95%  Weight:   58.5 kg   Height:       CBC Latest Ref Rng & Units 11/22/2020 11/20/2020 11/19/2020  WBC 4.0 - 10.5 K/uL 4.8 6.4 7.6  Hemoglobin 12.0 - 15.0 g/dL 9.1(L) 9.1(L) 10.9(L)  Hematocrit 36.0 - 46.0 % 28.6(L) 28.5(L) 34.4(L)  Platelets 150 - 400 K/uL 179 120(L) 142(L)   BMP Latest Ref Rng & Units 11/23/2020 11/22/2020 11/20/2020  Glucose 70 - 99 mg/dL 108(H) 103(H) 106(H)  BUN 8 - 23 mg/dL 11 10 11   Creatinine 0.44 - 1.00 mg/dL 0.63 0.53 0.61  Sodium 135 - 145 mmol/L 132(L) 135 131(L)  Potassium 3.5 - 5.1 mmol/L 4.4 4.5 3.3(L)  Chloride 98 - 111 mmol/L 97(L) 103 100  CO2 22 - 32 mmol/L 29 28 24   Calcium 8.9 - 10.3 mg/dL 8.8(L) 8.3(L) 7.7(L)   Intake/Output      10/11 0701 10/12 0700 10/12 0701 10/13 0700   P.O. 480 240   Total Intake(mL/kg) 480 (8.2) 240 (4.1)   Urine (mL/kg/hr) 1625 (1.2) 1250 (3)   Stool 0 0   Total Output 1625 1250   Net -1145 -1010        Stool Occurrence 2 x 1 x      Physical Exam: General: NAD.  Laying in bed, calm Resp: No increased wob Cardio: regular rate and rhythm ABD soft Neurologically intact MSK Neurovascularly intact Sensation intact distally Intact pulses distally Dorsiflexion/Plantar flexion intact Incision: C/D/I   Assessment:  Principal  Problem:   Syncope Active Problems:   HLD (hyperlipidemia)   Seasonal and perennial allergic rhinitis   INSOMNIA   Closed rib fracture   Subdural hematoma   Acute prerenal azotemia   Chronic diastolic CHF (congestive heart failure) (HCC)   SIRS (systemic inflammatory response syndrome) (HCC)   Malnutrition of moderate degree   Plan:  Weightbearing: WBAT LLE Orthopedic device(s): None Follow - up plan:  as already scheduled Contact information:  Edmonia Lynch MD, Aggie Moats PA-C  Dispo: Notes per primary team sound like she is doing to be d/c Home today with PCP and Cardiology f/u for her syncope and orthostatic hypotension. Orthopedic wise she is doing well and will follow up with [place] as normal. Continue PT at home as tolerated.   Britt Bottom, PA-C Office [phone] 11/23/2020, 2:11 PM

## 2020-11-23 NOTE — TOC CAGE-AID Note (Signed)
Transition of Care [facility]) - CAGE-AID Screening   Patient Details  Name: Hailey [name] MRN: [id number] Date of Birth: 1940-10-19  Transition [facility]) CM/SW Contact:    Gaetano Hawthorne Tarpley-Carter, [place] Phone Number: 11/23/2020, 12:04 PM   Clinical Narrative: Pt participated in [place].  Pt stated she does not use substance or ETOH. Pt has a history of smoking cigarettes and drinking ETOH. Pt was not offered resources, due to no usage of substance or ETOH.     Hannan Hutmacher Tarpley-Carter, MSW, LCSW-A Pronouns:  She/Her/Hers [facility]itions of Care Clinical Social Worker Direct Number:  [phone] [email or web address]  CAGE-AID Screening:    Have You Ever Felt You Ought to Cut Down on Your Drinking or Drug Use?: No Have People Annoyed You By [employer] Your Drinking Or Drug Use?: No Have You Felt Bad Or Guilty About Your Drinking Or Drug Use?: No Have You Ever Had a Drink or Used Drugs First Thing In The Morning to Steady Your Nerves or to Get Rid of a Hangover?: No CAGE-AID Score: 0  Substance Abuse Education Offered: No

## 2020-11-23 NOTE — TOC Transition Note (Signed)
Transition of Care [facility]) - CM/SW Discharge Note   Patient Details  Name: Hailey [name] MRN: [id number] Date of Birth: 1940/10/02  [facility] of [facility]) CM/SW Contact:  Zenon Mayo, RN Phone Number: 11/23/2020, 1:51 PM   Clinical Narrative:    NCM spoke with patient offered choice , she states she does not have a preference, NCM made referral to [facility] with [facility], she is able to take referral for HHPT. Soc will begin 24 to 48 hrs post dc. Patient states she has all needed DME at home.   She has a 24 hr caregiver also at home.     Final next level of care: [place] Barriers to Discharge: No Barriers Identified   Patient Goals and CMS Choice Patient states their goals for this hospitalization and ongoing recovery are:: return home CMS Medicare.gov Compare Post Acute Care list provided to:: Patient Choice offered to / list presented to : Patient  Discharge Placement                       Discharge Plan and Services                  DME Agency: NA       HH Arranged: PT HH Agency: [place] Date [place]: 11/23/20 Time [place]: 1350 Representative spoke with at [place]: [place] Determinants of Health ([place]) Interventions     Readmission Risk Interventions No flowsheet data found.

## 2020-11-23 NOTE — TOC Progression Note (Signed)
Transition of Care [facility]) - Progression Note    Patient Details  Name: Hailey [name] MRN: [id number] Date of Birth: 1940-10-12  Transition [facility]) CM/SW Contact  Zenon Mayo, RN Phone Number: 11/23/2020, 12:17 PM  Clinical Narrative:    NCM spoke with paitent, offered choice, she states she does not have a preference, NCM made referral to [facility] with [facility], she is able to take referral for HHPT. Soc will begin 24 to 48 hrs post dc.        Expected Discharge Plan and Services           Expected Discharge Date: 11/23/20                                     Social Determinants of Health (SDOH) Interventions    Readmission Risk Interventions No flowsheet data found.

## 2020-11-23 NOTE — Progress Notes (Signed)
RN educated patient on the flu vaccine and patient expresses she has no previous reaction to flu vaccine. RN provided flu vaccine  education sheet to the patient. RN administered flu vaccine x IM on the left deltoid. Patient tolerated well. Site is covered by a Band-Aid.

## 2020-11-23 NOTE — Progress Notes (Signed)
Progress Note  Patient Name: Hailey [name] Date of Encounter: 11/23/2020  Primary Cardiologist:   Skeet Latch, MD   Subjective   She denies any SOB or dizziness  Inpatient Medications    Scheduled Meds:  atorvastatin  40 mg Oral Daily   escitalopram  20 mg Oral Daily   feeding supplement  237 mL Oral BID BM   fluticasone  1 spray Each Nare Daily   gabapentin  300 mg Oral Daily   influenza vaccine adjuvanted  0.5 mL Intramuscular Tomorrow-1000   multivitamin with minerals  1 tablet Oral Daily   Continuous Infusions:  PRN Meds: acetaminophen **OR** acetaminophen, alum & mag hydroxide-simeth, HYDROcodone-acetaminophen, lip balm, LORazepam, naLOXone (NARCAN)  injection   Vital Signs    Vitals:   11/22/20 0528 11/22/20 1147 11/22/20 2253 11/23/20 0459  BP: (!) 152/73 120/63 (!) 143/78 (!) 143/78  Pulse: 67 80 70 72  Resp: 18 18 16 20   Temp: 97.7 F (36.5 C) 97.8 F (36.6 C) 98.6 F (37 C) 97.9 F (36.6 C)  TempSrc: Oral Oral Oral Oral  SpO2: 97% 95% 97% 95%  Weight: 59.5 kg   58.5 kg  Height:        Intake/Output Summary (Last 24 hours) at 11/23/2020 0952 Last data filed at 11/23/2020 0849 Gross per 24 hour  Intake 720 ml  Output 2475 ml  Net -1755 ml   Filed Weights   11/20/20 0022 11/22/20 0528 11/23/20 0459  Weight: 60.1 kg 59.5 kg 58.5 kg    Telemetry    NSR - Personally Reviewed  ECG    NA - Personally Reviewed  Physical Exam   GEN: No acute distress.   Neck: No  JVD Cardiac: RRR, no murmurs, rubs, or gallops.  Respiratory: Clear  to auscultation bilaterally. GI: Soft, nontender, non-distended  MS: No  edema; No deformity. Neuro:  Nonfocal  Psych: Normal affect   Labs    Chemistry Recent Labs  Lab 11/19/20 0320 11/20/20 0213 11/22/20 0155 11/23/20 0306  NA 134* 131* 135 132*  K 3.6 3.3* 4.5 4.4  CL 99 100 103 97*  CO2 23 24 28 29   GLUCOSE 133* 106* 103* 108*  BUN 12 11 10 11   CREATININE 0.78 0.61 0.53 0.63  CALCIUM  8.7* 7.7* 8.3* 8.8*  PROT 6.1*  --   --   --   ALBUMIN 3.5 2.8*  --   --   AST 23  --   --   --   ALT 14  --   --   --   ALKPHOS 41  --   --   --   BILITOT 0.7  --   --   --   GFRNONAA >60 >60 >60 >60  ANIONGAP 12 7 4* 6     Hematology Recent Labs  Lab 11/19/20 0320 11/20/20 0213 11/22/20 0155  WBC 7.6 6.4 4.8  RBC 3.79* 3.13* 3.21*  HGB 10.9* 9.1* 9.1*  HCT 34.4* 28.5* 28.6*  MCV 90.8 91.1 89.1  MCH 28.8 29.1 28.3  MCHC 31.7 31.9 31.8  RDW 14.4 14.3 14.2  PLT 142* 120* 179    Cardiac EnzymesNo results for input(s): TROPONINI in the last 168 hours. No results for input(s): TROPIPOC in the last 168 hours.   BNPNo results for input(s): BNP, PROBNP in the last 168 hours.   DDimer No results for input(s): DDIMER in the last 168 hours.   Radiology    MR BRAIN WO CONTRAST  Result Date:  11/22/2020 CLINICAL DATA:  Recurrent syncope.  Intracranial hemorrhage. EXAM: MRI HEAD WITHOUT CONTRAST TECHNIQUE: Multiplanar, multiecho pulse sequences of the brain and surrounding structures were obtained without intravenous contrast. COMPARISON:  Head CT 11/18/2020 FINDINGS: Brain: There is a small amount of subdural blood over the left hemisphere. No acute infarct. Hyperintense T2-weighted signal is moderately widespread throughout the white matter. Generalized volume loss without a clear lobar predilection. The midline structures are normal. Vascular: Major flow voids are preserved. Skull and upper cervical spine: Normal calvarium and skull base. Visualized upper cervical spine and soft tissues are normal. Sinuses/Orbits:No paranasal sinus fluid levels or advanced mucosal thickening. No mastoid or middle ear effusion. Normal orbits. IMPRESSION: 1. Small amount of subdural blood over the left hemisphere, unchanged. No mass effect or other acute intracranial abnormality. 2. Moderate chronic small vessel ischemic disease and generalized volume loss. Electronically Signed   By: Ulyses Jarred M.D.    On: 11/22/2020 21:17    Cardiac Studies   ECHO:   1. Left ventricular ejection fraction, by estimation, is >75%. The left  ventricle has hyperdynamic function. The left ventricle has no regional  wall motion abnormalities. Left ventricular diastolic parameters are  consistent with Grade I diastolic  dysfunction (impaired relaxation).   2. Right ventricular systolic function is normal. The right ventricular  size is normal.   3. The mitral valve is normal in structure. Trivial mitral valve  regurgitation. No evidence of mitral stenosis. Moderate mitral annular  calcification.   4. The aortic valve is normal in structure. Aortic valve regurgitation is  trivial. No aortic stenosis is present.   5. The inferior vena cava is normal in size with greater than 50%  respiratory variability, suggesting right atrial pressure of 3 mmHg.   Patient Profile     80 y.o. female  with a hx of HTN, HLD, SAH (2015), alcohol abuse, grade 1 DD, and multiple orthopedic surgeries who is being seen 11/22/2020 for the evaluation of synocpe at the request of Dr. Karleen Hampshire.  Assessment & Plan      Syncope Recurrent falls See below   PSVT No symptoms related to this.  No change in therapy.     Orthostatic hypotension Still with an orthostatic drop this morning but not as severe.  Supine BP is slightly elevated.  This is the reason for her syncope and she and I discussed this.  She does not want abdominal binders but will look for some that she might be able to put on herself and tolerate. .  I would not suggest a loop implant and she would decline regardless.    Given the supine HTN I would not suggest midodrine or fludrocortisone.     Acute subdural hematoma Small on CT and MRI.     Anemia Per primary team.     For questions or updates, please contact [place] Please consult [email or web address] for contact info under Cardiology/STEMI.   Signed, Minus Breeding, MD  11/23/2020, 9:52 AM

## 2020-11-23 NOTE — Progress Notes (Signed)
Physical Therapy Treatment Patient Details Name: Hailey [name] MRN: [id number] DOB: December 09, 1940 Today's Date: 11/23/2020   History of Present Illness Pt is an 80 y.o. female who presented 11/18/20 with syncope episode. Imaging revealed thin left SDH. 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 PMH: chronic diastolic heart failure, hyperlipidemia, subarachnoid hemorrhage in December 2015    PT Comments    Upon standing from EOB she began to have a runny bowel movement and was thus quickly walked to the bathroom. Pt was then able to stand at the sink to wash her hands without UE support or LOB. After several minutes standing at the sink she began to experience symptoms of lightheadedness, see BP measures below. Pt requesting abdominal binder to manage her symptomatic orthostatic hypotension since she has difficulty donning her TED hose at home and her caregivers are only available 24/7 initially going home until she feels better and no longer needs them. Will continue to follow acutely. Current recommendations remain appropriate.    BP:  143/65 supine 142/76 sitting 115/70 standing 114/86 standing several minutes (lightheaded after standing several minutes after bowel movement)     Recommendations for follow up therapy are one component of a multi-disciplinary discharge planning process, led by the attending physician.  Recommendations may be updated based on patient status, additional functional criteria and insurance authorization.  Follow Up Recommendations  Home health PT;Supervision for mobility/OOB     Equipment Recommendations  None recommended by PT    Recommendations for Other Services       Precautions / Restrictions Precautions Precautions: Fall;Other (comment) Precaution Comments: orthostatic hypotension Restrictions Weight Bearing Restrictions: No     Mobility  Bed Mobility Overal bed mobility: Needs Assistance Bed  Mobility: Rolling;Sidelying to Sit;Sit to Supine Rolling: Supervision Sidelying to sit: Min assist;HOB elevated   Sit to supine: Min guard   General bed mobility comments: Pt rolls using bed rail, supervision. MinA to complete trunk ascension to sit from elevated HOB. Min guard for return to supine.    Transfers Overall transfer level: Needs assistance Equipment used: Rolling walker (2 wheeled) Transfers: Sit to/from Stand Sit to Stand: Min guard;Min assist         General transfer comment: Min guard assist and extra time to power up to stand from elevated EOB, but minA and cues to pull up on grab bars to come to stand from low toilet.  Ambulation/Gait Ambulation/Gait assistance: Min guard Gait Distance (Feet): 15 Feet (x2 bouts of ~15 ft each) Assistive device: Rolling walker (2 wheeled) Gait Pattern/deviations: Step-through pattern;Decreased stride length;Narrow base of support;Trunk flexed Gait velocity: decreased Gait velocity interpretation: <1.31 ft/sec, indicative of household ambulator General Gait Details: Pt with stiff movement and kyphotic posture, slow gait pace. However, no LOB, managing RW safely through doorway and tight space of the bathroom. Min guard assist for safety.   Stairs             Wheelchair Mobility    Modified Rankin (Stroke Patients Only) Modified Rankin (Stroke Patients Only) Pre-Morbid Rankin Score: Moderate disability Modified Rankin: Moderately severe disability     Balance Overall balance assessment: Needs assistance Sitting-balance support: Feet supported Sitting balance-Leahy Scale: Fair Sitting balance - Comments: Static sitting EOB with supervision for safety.   Standing balance support: Bilateral upper extremity supported;During functional activity;No upper extremity supported Standing balance-Leahy Scale: Fair Standing balance comment: Able to reach min off BOS without UE support to wash hands at sink. Bil UE  support to  ambulate.                            Cognition Arousal/Alertness: Awake/alert Behavior During Therapy: WFL for tasks assessed/performed Overall Cognitive Status: Within Functional Limits for tasks assessed                                        Exercises      General Comments General comments (skin integrity, edema, etc.): BP: 143/65 supine, 142/76 sitting, 115/70 standing, 114/86 standing several minutes (lightheaded after standing several minutes after bowel movement); runny bowel movement upon standing from EOB, thus pt quickly transferred to bathroom. pt fatigued afterwards and thus unable to progress distance further      Pertinent Vitals/Pain Pain Assessment: Faces Faces Pain Scale: Hurts little more Pain Location: headache Pain Descriptors / Indicators: Discomfort;Grimacing;Guarding;Headache Pain Intervention(s): Limited activity within patient's tolerance;Monitored during session;Patient requesting pain meds-RN notified;Repositioned    Home Living                      Prior Function            PT Goals (current goals can now be found in the care plan section) Acute Rehab PT Goals Patient Stated Goal: to go home to her cat PT Goal Formulation: With patient Time For Goal Achievement: 12/03/20 Potential to Achieve Goals: Good Progress towards PT goals: Progressing toward goals    Frequency    Min 3X/week      PT Plan Current plan remains appropriate    Co-evaluation              AM-PAC PT "6 Clicks" Mobility   Outcome Measure  Help needed turning from your back to your side while in a flat bed without using bedrails?: A Little Help needed moving from lying on your back to sitting on the side of a flat bed without using bedrails?: A Little Help needed moving to and from a bed to a chair (including a wheelchair)?: A Little Help needed standing up from a chair using your arms (e.g., wheelchair or bedside chair)?: A  Little Help needed to walk in hospital room?: A Little Help needed climbing 3-5 steps with a railing? : A Little 6 Click Score: 18    End of Session Equipment Utilized During Treatment: Gait belt Activity Tolerance: Patient tolerated treatment well;Patient limited by fatigue Patient left: in bed;with call bell/phone within reach;with bed alarm set;with family/visitor present Nurse Communication: Mobility status;Other (comment);Patient requests pain meds (request for abdominal binder) PT Visit Diagnosis: Muscle weakness (generalized) (M62.81);History of falling (Z91.81);Difficulty in walking, not elsewhere classified (R26.2);Dizziness and giddiness (R42)     Time: 5102-5852 PT Time Calculation (min) (ACUTE ONLY): 30 min  Charges:  $Gait Training: 8-22 mins $Therapeutic Activity: 8-22 mins                     Moishe Spice, PT, DPT Acute Rehabilitation Services  Pager: [phone] Office: [place] 11/23/2020, 10:39 AM

## 2020-11-23 NOTE — Plan of Care (Signed)
  Problem: Safety: Goal: Ability to remain free from injury will improve Outcome: Progressing   

## 2020-11-24 ENCOUNTER — Telehealth: Payer: Self-pay

## 2020-11-24 LAB — CULTURE, BLOOD (ROUTINE X 2)
Culture: NO GROWTH
Culture: NO GROWTH
Special Requests: ADEQUATE
Special Requests: ADEQUATE

## 2020-11-24 NOTE — Telephone Encounter (Signed)
Transition Care Management Unsuccessful Follow-up Telephone Call  Date of discharge and from where:  [facility] 11/23/2020  Attempts:  1st Attempt  Reason for unsuccessful TCM follow-up call:  Unable to leave message   Patient voice mail is full unable to leave a message   [facility] follw up with PCP x 1 week  L.Lux Skilton,LPN

## 2020-11-25 ENCOUNTER — Telehealth: Payer: Self-pay

## 2020-11-25 DIAGNOSIS — K219 Gastro-esophageal reflux disease without esophagitis: Secondary | ICD-10-CM | POA: Diagnosis not present

## 2020-11-25 DIAGNOSIS — M4802 Spinal stenosis, cervical region: Secondary | ICD-10-CM | POA: Diagnosis not present

## 2020-11-25 DIAGNOSIS — I5032 Chronic diastolic (congestive) heart failure: Secondary | ICD-10-CM | POA: Diagnosis not present

## 2020-11-25 DIAGNOSIS — I951 Orthostatic hypotension: Secondary | ICD-10-CM | POA: Diagnosis not present

## 2020-11-25 DIAGNOSIS — S42032D Displaced fracture of lateral end of left clavicle, subsequent encounter for fracture with routine healing: Secondary | ICD-10-CM | POA: Diagnosis not present

## 2020-11-25 DIAGNOSIS — K580 Irritable bowel syndrome with diarrhea: Secondary | ICD-10-CM | POA: Diagnosis not present

## 2020-11-25 DIAGNOSIS — E440 Moderate protein-calorie malnutrition: Secondary | ICD-10-CM | POA: Diagnosis not present

## 2020-11-25 DIAGNOSIS — S2243XD Multiple fractures of ribs, bilateral, subsequent encounter for fracture with routine healing: Secondary | ICD-10-CM | POA: Diagnosis not present

## 2020-11-25 DIAGNOSIS — I6789 Other cerebrovascular disease: Secondary | ICD-10-CM | POA: Diagnosis not present

## 2020-11-25 DIAGNOSIS — G319 Degenerative disease of nervous system, unspecified: Secondary | ICD-10-CM | POA: Diagnosis not present

## 2020-11-25 DIAGNOSIS — G4733 Obstructive sleep apnea (adult) (pediatric): Secondary | ICD-10-CM | POA: Diagnosis not present

## 2020-11-25 DIAGNOSIS — Z7982 Long term (current) use of aspirin: Secondary | ICD-10-CM | POA: Diagnosis not present

## 2020-11-25 DIAGNOSIS — R7989 Other specified abnormal findings of blood chemistry: Secondary | ICD-10-CM | POA: Diagnosis not present

## 2020-11-25 DIAGNOSIS — G4709 Other insomnia: Secondary | ICD-10-CM | POA: Diagnosis not present

## 2020-11-25 DIAGNOSIS — K7460 Unspecified cirrhosis of liver: Secondary | ICD-10-CM | POA: Diagnosis not present

## 2020-11-25 DIAGNOSIS — I082 Rheumatic disorders of both aortic and tricuspid valves: Secondary | ICD-10-CM | POA: Diagnosis not present

## 2020-11-25 DIAGNOSIS — S065X0D Traumatic subdural hemorrhage without loss of consciousness, subsequent encounter: Secondary | ICD-10-CM | POA: Diagnosis not present

## 2020-11-25 DIAGNOSIS — E785 Hyperlipidemia, unspecified: Secondary | ICD-10-CM | POA: Diagnosis not present

## 2020-11-25 DIAGNOSIS — I110 Hypertensive heart disease with heart failure: Secondary | ICD-10-CM | POA: Diagnosis not present

## 2020-11-25 DIAGNOSIS — J302 Other seasonal allergic rhinitis: Secondary | ICD-10-CM | POA: Diagnosis not present

## 2020-11-25 DIAGNOSIS — M47812 Spondylosis without myelopathy or radiculopathy, cervical region: Secondary | ICD-10-CM | POA: Diagnosis not present

## 2020-11-25 DIAGNOSIS — S42034D Nondisplaced fracture of lateral end of right clavicle, subsequent encounter for fracture with routine healing: Secondary | ICD-10-CM | POA: Diagnosis not present

## 2020-11-25 DIAGNOSIS — Z9181 History of falling: Secondary | ICD-10-CM | POA: Diagnosis not present

## 2020-11-25 DIAGNOSIS — F32A Depression, unspecified: Secondary | ICD-10-CM | POA: Diagnosis not present

## 2020-11-25 DIAGNOSIS — R6510 Systemic inflammatory response syndrome (SIRS) of non-infectious origin without acute organ dysfunction: Secondary | ICD-10-CM | POA: Diagnosis not present

## 2020-11-25 NOTE — Telephone Encounter (Signed)
Transition Care Management Unsuccessful Follow-up Telephone Call  Date of discharge and from where:  11/23/2020 from [facility]  Attempts:  2nd Attempt  Reason for unsuccessful TCM follow-up call:  Spoke with patient.  She would like to keep her appt with Dr. Sharlet Salina for 12/12/2020 at 3:00 pm.

## 2020-11-25 NOTE — Progress Notes (Incomplete)
Cardiology Office Note   Date:  11/25/2020   ID:  Hailey, [name] [birth year], MRN [id number]  PCP:  Hoyt Koch, MD  Cardiologist:   Madelin Rear   No chief complaint on file.    Patient ID:  Hailey [name] is a 80 y.o. female with hypertension, hyperlipidemia, subarachnoid hemorrhage, and alcohol abuse who presents for follow-up.  [name] was first seen 11/2014 with exertional dyspnea.   She had an echo 12/23/14 that showed grade 1 diastolic dysfunction and mild dilation of the ascending aorta. Sysotic function was normal and there were no wall motion abnormalities. She also had carotid Dopplers that showed 1-39% stenosis bilaterally.  At her last appointment she had experienced two episodes of syncope on 08/2015. The episode occurred upon standing up and walking to the bathroom. She was able to get back in her bed and had a recurrent event a few hours later.  She decided to stop her blood pressure medications and has not had any recurrent symptoms.  This was in the setting of a 20 lb intentional weight loss.   Since her last appointment Hailey [name] has been feeling well.  Her only complaint is intermittent episodes of severe pain in her anterior neck.  This occurs sporadically and typically when she is turning her head.  She notices it most when getting in bed at night.  The episodes last for approximately 30 seconds and are not associated with exertion.  There is no shortness of breath.  It feels like it might be a muscle spasming.  She has had 2 neck surgeries, most recently 04/2013.  She has no chest pain or shortness of breath.  She has not experienced any lower extremity edema, orthopnea, or PND.  She has not been exercising much lately.    Past Medical History:  Diagnosis Date   Allergic rhinitis, cause unspecified    Carotid artery disease ([place]) 12/13/2014   1-39% stenosis of bilateral CCAs.   Repeat ultrasound 11/2016.   Cervical spine fracture ([facility]) 02/01/2014    Chronic insomnia    Chronic interstitial cystitis    Closed fracture of rib(s), unspecified    Depression    Disorder of bone and cartilage, unspecified    Esophageal reflux    Injury to unspecified nerve of pelvic girdle and lower limb(956.9)    Left rotator cuff tear arthropathy 12/10/2017   OSA (obstructive sleep apnea)    Osteoarthrosis, unspecified whether generalized or localized, unspecified site    Other and unspecified hyperlipidemia    Personal history of colonic adenomas 11/28/2012   Personal history of other disorders of nervous system and sense organs    SAH (subarachnoid hemorrhage) ([place]) 02/02/2014   Unspecified closed fracture of pelvis    x 2 2004/2006   Unspecified essential hypertension     Past Surgical History:  Procedure Laterality Date   ANKLE FUSION Left    BACK SURGERY     ; has  had 8 back surgeries   CERVICAL FUSION     C4-7   COLONOSCOPY     CYSTOSCOPY     HERNIA REPAIR     umbilical   LUMBAR FUSION     L2-3   REVERSE SHOULDER ARTHROPLASTY Left 12/10/2017   Procedure: LEFT REVERSE SHOULDER ARTHROPLASTY;  Surgeon: Marchia Bond, MD;  Location: [facility];  Service: Orthopedics;  Laterality: Left;  Interscalene Block   SACRAL FUSION  07/2012   TIBIA FRACTURE SURGERY Right    TONSILLECTOMY  TOTAL HIP ARTHROPLASTY Right 2012   TOTAL HIP ARTHROPLASTY Left 10/18/2020   Procedure: TOTAL HIP ARTHROPLASTY ANTERIOR APPROACH;  Surgeon: Renette Butters, MD;  Location: [facility];  Service: Orthopedics;  Laterality: Left;   TUBAL LIGATION     UPPER GASTROINTESTINAL ENDOSCOPY       Current Outpatient Medications  Medication Sig Dispense Refill   acetaminophen (TYLENOL) 500 MG tablet Take 2 tablets (1,000 mg total) by mouth every 8 (eight) hours as needed for mild pain or moderate pain. 60 tablet 0   aspirin EC 81 MG tablet Take 1 tablet (81 mg total) by mouth 2 (two) times daily. For DVT prophylaxis for 30 days after surgery. 60 tablet 0   atorvastatin  (LIPITOR) 40 MG tablet Take 1 tablet (40 mg total) by mouth daily. 90 tablet 3   denosumab (PROLIA) 60 MG/ML SOLN injection Inject 60 mg into the skin every 6 (six) months. Administer in upper arm, thigh, or abdomen     diclofenac sodium (VOLTAREN) 1 % GEL Apply 2 g topically at bedtime.     escitalopram (LEXAPRO) 20 MG tablet Take 1 tablet (20 mg total) by mouth daily. 90 tablet 3   fluticasone (FLONASE) 50 MCG/ACT nasal spray Use 1 spray each nostril 1-2 times daily as needed (Patient taking differently: Place 1 spray into both nostrils 2 (two) times daily.) 16 g 5   gabapentin (NEURONTIN) 300 MG capsule Take 300 mg by mouth daily.     HYDROcodone-acetaminophen (NORCO) 10-325 MG tablet Take 1 tablet by mouth every 6 (six) hours as needed for severe pain. 28 tablet 0   loperamide (IMODIUM) 2 MG capsule Take 2 mg by mouth as needed for diarrhea or loose stools.     LORazepam (ATIVAN) 1 MG tablet Take 1 tablet (1 mg total) by mouth at bedtime. 90 tablet 0   meloxicam (MOBIC) 15 MG tablet Take 1 tablet (15 mg total) by mouth daily as needed for pain (and inflammation). 30 tablet 0   methocarbamol (ROBAXIN) 500 MG tablet Take 1 tablet (500 mg total) by mouth every 8 (eight) hours as needed for muscle spasms. 20 tablet 0   ondansetron (ZOFRAN ODT) 4 MG disintegrating tablet Take 1 tablet (4 mg total) by mouth 2 (two) times daily as needed for nausea or vomiting. 10 tablet 0   traMADol-acetaminophen (ULTRACET) 37.5-325 MG tablet Take 1 tablet by mouth 2 (two) times daily as needed.     zolpidem (AMBIEN) 5 MG tablet Take 1 tablet (5 mg total) by mouth at bedtime as needed. (Patient taking differently: Take 5 mg by mouth at bedtime.) 90 tablet 1   No current facility-administered medications for this visit.    Allergies:   Resorcinol    Social History:  The patient  reports that she quit smoking about 53 years ago. Her smoking use included cigarettes. She has a 20.00 pack-year smoking history. She has  never used smokeless tobacco. She reports that she does not drink alcohol and does not use drugs.   Family History:  The patient's family history includes Allergic rhinitis in her father; Alzheimer's disease in her maternal aunt; Cancer in her cousin and maternal aunt; Dementia in her mother; Heart attack in her father; Hypertension in her father; Stroke in her paternal uncle.    ROS:  Please see the history of present illness.   Otherwise, review of systems are positive for none.   All other systems are reviewed and negative.    PHYSICAL EXAM: VS:  There were no vitals taken for this visit. , BMI There is no height or weight on file to calculate BMI. GENERAL:  Well appearing no acute distress.   HEENT:  Pupils equal round and reactive, fundi not visualized, oral mucosa unremarkable NECK:  No jugular venous distention, waveform within normal limits, carotid upstroke brisk and symmetric, no bruits no tenderness to palpation. LUNGS:  Clear to auscultation bilaterally.  No crackles, wheezes, or rhonchi. HEART:  RRR.  PMI not displaced or sustained,S1 and S2 within normal limits, no S3, no S4, no clicks, no rubs, no murmurs ABD:  Flat, positive bowel sounds normal in frequency in pitch, no bruits, no rebound, no guarding, no midline pulsatile mass, no hepatomegaly, no splenomegaly EXT:  2 plus pulses throughout, no edema, no cyanosis no clubbing SKIN:  No rashes no nodules NEURO:  Cranial nerves II through XII grossly intact, motor grossly intact throughout PSYCH:  Cognitively intact, oriented to person place and time   EKG:  EKG is ordered today. 11/24/15: Sinus rhythm rate 71 bpm.  Low voltage.   11/26/14:sinus rhythm at 83 BMP. Low voltage limb leads.  Prior inferior infarct.  L axis deviation.   12/13/16: Sinus rhythm.  Rate 80 bpm.  Low voltage limb leads.  Prior inferior infarct.  L axis deviation.    Echo 12/23/14: Study Conclusions   - Left ventricle: The cavity size was normal. Wall  thickness was   normal. Systolic function was normal. The estimated ejection   fraction was in the range of 55% to 60%. Wall motion was normal;   there were no regional wall motion abnormalities. Doppler   parameters are consistent with abnormal left ventricular   relaxation (grade 1 diastolic dysfunction). - Aortic valve: Trileaflet; mildly thickened, mildly calcified   leaflets. - Aorta: The aorta was mildly dilated. Ascending aortic diameter:   38 mm (S). - Mitral valve: Calcified annulus.   Lexiscan Cardiolite 12/07/14: Nuclear stress EF: 67%. The left ventricular ejection fraction is hyperdynamic (>65%). There was no ST segment deviation noted during stress. This is a low risk study.   Technically suboptimal due to subdiaphragmatic activity; low risk stress nuclear study with a medium size, severe intensity, fixed inferior apical defect suggestive of prior infarct; no ischemia; EF 67 with normal wall motion.  Carotid Doppler 12/07/14:   1-39% stenosis of bilateral common carotid arteries.  recommended follow-up 11/2016.  Recent Labs: 11/19/2020: ALT 14; TSH 1.643 11/20/2020: Magnesium 1.8 11/22/2020: Hemoglobin 9.1; Platelets 179 11/23/2020: BUN 11; Creatinine, Ser 0.63; Potassium 4.4; Sodium 132    Lipid Panel    Component Value Date/Time   CHOL 178 09/06/2020 1608   TRIG 92.0 09/06/2020 1608   TRIG 40 12/25/2005 1439   HDL 52.40 09/06/2020 1608   CHOLHDL 3 09/06/2020 1608   VLDL 18.4 09/06/2020 1608   LDLCALC 107 (H) 09/06/2020 1608   LDLDIRECT 170.9 03/20/2010 1356      Wt Readings from Last 3 Encounters:  11/23/20 129 lb (58.5 kg)  10/18/20 129 lb (58.5 kg)  10/05/20 129 lb (58.5 kg)      ASSESSMENT AND PLAN:  # Carotid artery disease: Mild 11/2014.  Repeat carotid Dopplers. Continue atorvastatin.  Consider aspirin.  If they are stable we will not repeat imaging in the future.  # Palpitations: Stable.   # Hyperlipidemia:  Continue atorvastatin.  She wi  return for fasting lipids and a CMP.ll   # Hypertension:  Blood pressure remains well-controlled off all antihypertensives.   #  Neck pain: Recommended that she follow up with her surgeon to ensure that the hardware is OK.    Current medicines are reviewed at length with the patient today.  The patient does not have concerns regarding medicines.  The following changes have been made:  no change  Labs/ tests ordered today include:   No orders of the defined types were placed in this encounter.    Disposition:   FU with [name] C. Oval Linsey, MD in 1 year.   Waynetta Pean  11/25/2020 10:38 AM    [facility]

## 2020-11-28 ENCOUNTER — Telehealth: Payer: Self-pay | Admitting: Cardiovascular Disease

## 2020-11-28 ENCOUNTER — Ambulatory Visit (HOSPITAL_BASED_OUTPATIENT_CLINIC_OR_DEPARTMENT_OTHER): Payer: Medicare Other | Admitting: Cardiovascular Disease

## 2020-11-28 NOTE — Telephone Encounter (Signed)
[name] initially called in to reschedule patient's hospital follow up with Dr. Oval Linsey. She states the patient is still recovering from recently being discharged and pushed it out to 01/19/21. She requested that I send a message to Dr. Ardine Eng to see if there is anywhere she can be worked it. Daughter states that she fears for her mother's life.

## 2020-11-28 NOTE — Telephone Encounter (Signed)
Spoke with patients daughter and offered appointment later this week. Daughter stated that mother had her cancel another appointment later this week so she does not think her mother will come.  Scheduled follow up appointment with Overton Mam NP for 11/9 New address given to daughter  Daughter appreciated call back

## 2020-12-07 ENCOUNTER — Telehealth: Payer: Self-pay | Admitting: Internal Medicine

## 2020-12-07 DIAGNOSIS — M2242 Chondromalacia patellae, left knee: Secondary | ICD-10-CM | POA: Diagnosis not present

## 2020-12-07 NOTE — Telephone Encounter (Signed)
Needs visit for this refill as she is due for visit and did not complete UDS as ordered at last visit.

## 2020-12-09 NOTE — Telephone Encounter (Signed)
Pt made aware. She verbalized understanding. No questions or concerns.

## 2020-12-09 NOTE — Telephone Encounter (Signed)
Patient wants to know if provider is willing to do a short fill for LORazepam (ATIVAN) 1 MG tablet until her visit 10/31  Patient says she was in hospital 6 days this month & wasn't able to keep prev visit & had to reschedule  Pharmacy:   [place], [address]  Phone:  [phone] Fax:  [phone]

## 2020-12-09 NOTE — Telephone Encounter (Signed)
I am sorry but since she did not do UDS at last visit and has not kept our follow up while getting additional controlled substances without notification of [place] I cannot fill without visit. Per her pain contract she agrees to all of these things which are her responsibility to uphold.

## 2020-12-09 NOTE — Telephone Encounter (Signed)
Does she need to complete the drug screening test before a refill can be given? Please advise

## 2020-12-12 ENCOUNTER — Ambulatory Visit: Payer: Medicare Other | Admitting: Internal Medicine

## 2020-12-15 ENCOUNTER — Other Ambulatory Visit: Payer: Self-pay

## 2020-12-15 ENCOUNTER — Ambulatory Visit (INDEPENDENT_AMBULATORY_CARE_PROVIDER_SITE_OTHER): Payer: Medicare Other | Admitting: Internal Medicine

## 2020-12-15 ENCOUNTER — Encounter: Payer: Self-pay | Admitting: Internal Medicine

## 2020-12-15 VITALS — BP 124/80 | HR 93 | Resp 18 | Ht 67.0 in | Wt 123.2 lb

## 2020-12-15 DIAGNOSIS — G4700 Insomnia, unspecified: Secondary | ICD-10-CM

## 2020-12-15 DIAGNOSIS — G894 Chronic pain syndrome: Secondary | ICD-10-CM | POA: Diagnosis not present

## 2020-12-15 MED ORDER — GABAPENTIN 300 MG PO CAPS: 300.0000 mg | ORAL_CAPSULE | Freq: Every day | ORAL | 1 refills | Status: DC

## 2020-12-15 MED ORDER — TRAMADOL-ACETAMINOPHEN 37.5-325 MG PO TABS: 1.0000 | ORAL_TABLET | Freq: Two times a day (BID) | ORAL | 5 refills | Status: DC | PRN

## 2020-12-15 MED ORDER — LORAZEPAM 1 MG PO TABS: 1.0000 mg | ORAL_TABLET | Freq: Every day | ORAL | 1 refills | Status: DC

## 2020-12-15 NOTE — Progress Notes (Signed)
   Subjective:   Patient ID: Hailey [name], female    DOB: 1941/01/08, 80 y.o.   MRN: [id number]  HPI The patient is an 80 YO female coming in for pain management and hospital follow up.   Review of Systems  Constitutional:  Positive for activity change.  HENT: Negative.    Eyes: Negative.   Respiratory:  Negative for cough, chest tightness and shortness of breath.   Cardiovascular:  Negative for chest pain, palpitations and leg swelling.  Gastrointestinal:  Negative for abdominal distention, abdominal pain, constipation, diarrhea, nausea and vomiting.  Musculoskeletal:  Positive for arthralgias, back pain and myalgias.  Skin: Negative.   Neurological: Negative.   Psychiatric/Behavioral: Negative.     Objective:  Physical Exam Constitutional:      Appearance: She is well-developed.  HENT:     Head: Normocephalic and atraumatic.  Cardiovascular:     Rate and Rhythm: Normal rate and regular rhythm.  Pulmonary:     Effort: Pulmonary effort is normal. No respiratory distress.     Breath sounds: Normal breath sounds. No wheezing or rales.  Abdominal:     General: Bowel sounds are normal. There is no distension.     Palpations: Abdomen is soft.     Tenderness: There is no abdominal tenderness. There is no rebound.  Musculoskeletal:        General: Tenderness present.     Cervical back: Normal range of motion.  Skin:    General: Skin is warm and dry.  Neurological:     Mental Status: She is alert and oriented to person, place, and time.     Coordination: Coordination abnormal.    Vitals:   12/15/20 1058  BP: 124/80  Pulse: 93  Resp: 18  SpO2: 98%  Weight: 123 lb 3.2 oz (55.9 kg)  Height: 5\' 7"  (1.702 m)    This visit occurred during the SARS-CoV-2 public health emergency.  Safety protocols were in place, including screening questions prior to the visit, additional usage of staff PPE, and extensive cleaning of exam room while observing appropriate contact time as  indicated for disinfecting solutions.   Assessment & Plan:  Visit time 25 minutes in face to face communication with patient and coordination of care, additional 5 minutes spent in record review, coordination or care, ordering tests, communicating/referring to other healthcare professionals, documenting in medical records all on the same day of the visit for total time 30 minutes spent on the visit.

## 2020-12-16 NOTE — Assessment & Plan Note (Signed)
UDS ordered and updated controlled substance contract. [place] narcotic database reviewed and hydrocodone rx from back surgery. Reminded that she is able to get pain medication for a new or different problem but she needs to notify us to that we are aware. Assuming UDS appropriate can continue to get tramadol #45 per month.

## 2020-12-16 NOTE — Assessment & Plan Note (Signed)
Uses ambien and lorazepam for sleeping and checking UDS. Refilled both today.

## 2020-12-18 LAB — DRUG MONITOR, BENZO,W/CONF, URINE
Alphahydroxyalprazolam: NEGATIVE ng/mL (ref <25)
Alphahydroxymidazolam: NEGATIVE ng/mL (ref <50)
Alphahydroxytriazolam: NEGATIVE ng/mL (ref <50)
Aminoclonazepam: NEGATIVE ng/mL (ref <25)
Benzodiazepines: POSITIVE ng/mL — AB (ref <100)
Hydroxyethylflurazepam: NEGATIVE ng/mL (ref <50)
Lorazepam: 361 ng/mL — ABNORMAL HIGH (ref <50)
Nordiazepam: NEGATIVE ng/mL (ref <50)
Oxazepam: NEGATIVE ng/mL (ref <50)
Temazepam: NEGATIVE ng/mL (ref <50)

## 2020-12-18 LAB — DRUG MONITOR, OXYCODONE,W/CONF, URINE: Oxycodone: NEGATIVE ng/mL (ref <100)

## 2020-12-18 LAB — DRUG MONITOR, COCAINEMETAB, W/CONF, URINE: Cocaine Metabolite: NEGATIVE ng/mL (ref <150)

## 2020-12-18 LAB — DRUG MONITOR, TRAMADOL,QN, URINE
Desmethyltramadol: 1508 ng/mL — ABNORMAL HIGH (ref <100)
Tramadol: 6289 ng/mL — ABNORMAL HIGH (ref <100)

## 2020-12-18 LAB — PRESCRIBED DRUGS,MEDMATCH(R)

## 2020-12-18 LAB — DRUG MONITOR,AMPHETAMINE,W/CONF, URINE: Amphetamines: NEGATIVE ng/mL (ref <500)

## 2020-12-18 LAB — DM TEMPLATE

## 2020-12-18 LAB — DRUG MONITOR,BARBITURATE,W/CONF, URINE: Barbiturates: NEGATIVE ng/mL (ref <300)

## 2020-12-18 LAB — DRUG MONITOR, OPIATES,W/CONF, URINE: Opiates: NEGATIVE ng/mL (ref <100)

## 2020-12-19 ENCOUNTER — Ambulatory Visit (INDEPENDENT_AMBULATORY_CARE_PROVIDER_SITE_OTHER): Payer: Medicare Other | Admitting: Cardiovascular Disease

## 2020-12-19 ENCOUNTER — Other Ambulatory Visit: Payer: Self-pay

## 2020-12-19 ENCOUNTER — Encounter (HOSPITAL_BASED_OUTPATIENT_CLINIC_OR_DEPARTMENT_OTHER): Payer: Self-pay | Admitting: Cardiovascular Disease

## 2020-12-19 VITALS — BP 130/74 | HR 88 | Ht 67.0 in | Wt 128.0 lb

## 2020-12-19 DIAGNOSIS — S065XAA Traumatic subdural hemorrhage with loss of consciousness status unknown, initial encounter: Secondary | ICD-10-CM

## 2020-12-19 DIAGNOSIS — Z5181 Encounter for therapeutic drug level monitoring: Secondary | ICD-10-CM | POA: Diagnosis not present

## 2020-12-19 DIAGNOSIS — R55 Syncope and collapse: Secondary | ICD-10-CM | POA: Diagnosis not present

## 2020-12-19 DIAGNOSIS — E7841 Elevated Lipoprotein(a): Secondary | ICD-10-CM | POA: Diagnosis not present

## 2020-12-19 DIAGNOSIS — I6523 Occlusion and stenosis of bilateral carotid arteries: Secondary | ICD-10-CM

## 2020-12-19 NOTE — Progress Notes (Signed)
Cardiology Office Note   Date:  12/19/2020   ID:  [name], Hailey 19-Oct-1940, MRN [id number]  PCP:  Hoyt Koch, MD  Cardiologist:   Skeet Latch, MD   No chief complaint on file.    Patient ID:  Hailey [name] is a 80 y.o. female with hypertension, hyperlipidemia, subarachnoid hemorrhage, and alcohol abuse who presents for follow-up.  Hailey [name] was first seen 11/2014 with exertional dyspnea.   She had an echo 12/23/14 that showed grade 1 diastolic dysfunction and mild dilation of the ascending aorta. Sysotic function was normal and there were no wall motion abnormalities. She also had carotid Dopplers that showed 1-39% stenosis bilaterally.  At her last appointment she had experienced two episodes of syncope on 08/2015. The episode occurred upon standing up and walking to the bathroom. She was able to get back in her bed and had a recurrent event a few hours later.  She decided to stop her blood pressure medications and has not had any recurrent symptoms.  This was in the setting of a 20 lb intentional weight loss.   Since her last appointment Hailey [name] has been feeling well.  Her only complaint is intermittent episodes of severe pain in her anterior neck.  This occurs sporadically and typically when she is turning her head.  She notices it most when getting in bed at night.  The episodes last for approximately 30 seconds and are not associated with exertion.  There is no shortness of breath.  It feels like it might be a muscle spasming.  She has had 2 neck surgeries, most recently 04/2013.  She has no chest pain or shortness of breath.  She has not experienced any lower extremity edema, orthopnea, or PND.  She has not been exercising much lately.  Hailey [name] was admitted 11/2020 with an episode of syncope.  It was thought to be due to orthostatic hypotension.  Her blood pressures lying down were 308 systolic.  She had a 30-40 point drop with positional change.  She was placed on  compression stockings and an abdominal binder.  Echo that admission revealed LVEF greater than 75% with grade 1 diastolic dysfunction.  Head CT revealed an acute subdural hematoma.  Imaging was stable on MRI so she did not require any intervention.  Since leaving the hospital she has been feeling a little better.  She bought compression socks but hates wearing them.  She has mild lightheadedness when standing.  She is very careful about getting up quickly and marches in place before she falls.  She has been increasing the salt in her diet, which seems to be helping.  She has also been drinking Gatorade.  She has not had any major episodes since discharge.  Not check her blood pressure at home.  She denies any chest pain or shortness of breath.  She has no lower extremity edema, orthopnea, or PND.  The only pain she gets in her legs is from her hip and her knee.  Past Medical History:  Diagnosis Date   Allergic rhinitis, cause unspecified    Carotid artery disease ([place]) 12/13/2014   1-39% stenosis of bilateral CCAs.   Repeat ultrasound 11/2016.   Cervical spine fracture (HCC) 02/01/2014   Chronic insomnia    Chronic interstitial cystitis    Closed fracture of rib(s), unspecified    Depression    Disorder of bone and cartilage, unspecified    Esophageal reflux    Injury to unspecified  nerve of pelvic girdle and lower limb(956.9)    Left rotator cuff tear arthropathy 12/10/2017   OSA (obstructive sleep apnea)    Osteoarthrosis, unspecified whether generalized or localized, unspecified site    Other and unspecified hyperlipidemia    Personal history of colonic adenomas 11/28/2012   Personal history of other disorders of nervous system and sense organs    SAH (subarachnoid hemorrhage) ([place]) 02/02/2014   Unspecified closed fracture of pelvis    x 2 2004/2006   Unspecified essential hypertension     Past Surgical History:  Procedure Laterality Date   ANKLE FUSION Left    BACK SURGERY     ;  has  had 8 back surgeries   CERVICAL FUSION     C4-7   COLONOSCOPY     CYSTOSCOPY     HERNIA REPAIR     umbilical   LUMBAR FUSION     L2-3   REVERSE SHOULDER ARTHROPLASTY Left 12/10/2017   Procedure: LEFT REVERSE SHOULDER ARTHROPLASTY;  Surgeon: Marchia Bond, MD;  Location: [facility];  Service: Orthopedics;  Laterality: Left;  Interscalene Block   SACRAL FUSION  07/2012   TIBIA FRACTURE SURGERY Right    TONSILLECTOMY     TOTAL HIP ARTHROPLASTY Right 2012   TOTAL HIP ARTHROPLASTY Left 10/18/2020   Procedure: TOTAL HIP ARTHROPLASTY ANTERIOR APPROACH;  Surgeon: Renette Butters, MD;  Location: [facility];  Service: Orthopedics;  Laterality: Left;   TUBAL LIGATION     UPPER GASTROINTESTINAL ENDOSCOPY       Current Outpatient Medications  Medication Sig Dispense Refill   acetaminophen (TYLENOL) 500 MG tablet Take 2 tablets (1,000 mg total) by mouth every 8 (eight) hours as needed for mild pain or moderate pain. 60 tablet 0   aspirin EC 81 MG tablet Take 1 tablet (81 mg total) by mouth 2 (two) times daily. For DVT prophylaxis for 30 days after surgery. 60 tablet 0   atorvastatin (LIPITOR) 40 MG tablet Take 1 tablet (40 mg total) by mouth daily. 90 tablet 3   denosumab (PROLIA) 60 MG/ML SOLN injection Inject 60 mg into the skin every 6 (six) months. Administer in upper arm, thigh, or abdomen     diclofenac sodium (VOLTAREN) 1 % GEL Apply 2 g topically at bedtime.     escitalopram (LEXAPRO) 20 MG tablet Take 1 tablet (20 mg total) by mouth daily. 90 tablet 3   fluticasone (FLONASE) 50 MCG/ACT nasal spray Use 1 spray each nostril 1-2 times daily as needed (Patient taking differently: Place 1 spray into both nostrils 2 (two) times daily.) 16 g 5   gabapentin (NEURONTIN) 300 MG capsule Take 1 capsule (300 mg total) by mouth daily. 90 capsule 1   loperamide (IMODIUM) 2 MG capsule Take 2 mg by mouth as needed for diarrhea or loose stools.     LORazepam (ATIVAN) 1 MG tablet Take 1 tablet (1 mg total) by  mouth at bedtime. 90 tablet 1   meloxicam (MOBIC) 15 MG tablet Take 1 tablet (15 mg total) by mouth daily as needed for pain (and inflammation). 30 tablet 0   methocarbamol (ROBAXIN) 500 MG tablet Take 1 tablet (500 mg total) by mouth every 8 (eight) hours as needed for muscle spasms. 20 tablet 0   ondansetron (ZOFRAN ODT) 4 MG disintegrating tablet Take 1 tablet (4 mg total) by mouth 2 (two) times daily as needed for nausea or vomiting. 10 tablet 0   traMADol-acetaminophen (ULTRACET) 37.5-325 MG tablet Take 1 tablet by  mouth 2 (two) times daily as needed. 45 tablet 5   zolpidem (AMBIEN) 5 MG tablet Take 1 tablet (5 mg total) by mouth at bedtime as needed. (Patient taking differently: Take 5 mg by mouth at bedtime.) 90 tablet 1   No current facility-administered medications for this visit.    Allergies:   Resorcinol    Social History:  The patient  reports that she quit smoking about 53 years ago. Her smoking use included cigarettes. She has a 20.00 pack-year smoking history. She has never used smokeless tobacco. She reports that she does not drink alcohol and does not use drugs.   Family History:  The patient's family history includes Allergic rhinitis in her father; Alzheimer's disease in her maternal aunt; Cancer in her cousin and maternal aunt; Dementia in her mother; Heart attack in her father; Hypertension in her father; Stroke in her paternal uncle.    ROS:  Please see the history of present illness.   Otherwise, review of systems are positive for none.   All other systems are reviewed and negative.    PHYSICAL EXAM: VS:  BP 130/74 (BP Location: Right Arm, Patient Position: Sitting, Cuff Size: Normal)   Pulse 88   Ht 5\' 7"  (1.702 m)   Wt 128 lb (58.1 kg)   SpO2 95%   BMI 20.05 kg/m  , BMI Body mass index is 20.05 kg/m. GENERAL:  Well appearing no acute distress.   HEENT:  Pupils equal round and reactive, fundi not visualized, oral mucosa unremarkable NECK:  No jugular venous  distention, waveform within normal limits, carotid upstroke brisk and symmetric, no bruits no tenderness to palpation. LUNGS:  Clear to auscultation bilaterally.  No crackles, wheezes, or rhonchi. HEART:  RRR.  PMI not displaced or sustained,S1 and S2 within normal limits, no S3, no S4, no clicks, no rubs, no murmurs ABD:  Flat, positive bowel sounds normal in frequency in pitch, no bruits, no rebound, no guarding, no midline pulsatile mass, no hepatomegaly, no splenomegaly EXT:  2 plus pulses throughout, no edema, no cyanosis no clubbing SKIN:  No rashes no nodules NEURO:  Cranial nerves II through XII grossly intact, motor grossly intact throughout PSYCH:  Cognitively intact, oriented to person place and time   EKG:  EKG is ordered today. 11/24/15: Sinus rhythm rate 71 bpm.  Low voltage.   11/26/14:sinus rhythm at 83 BMP. Low voltage limb leads.  Prior inferior infarct.  L axis deviation.   12/13/16: Sinus rhythm.  Rate 80 bpm.  Low voltage limb leads.  Prior inferior infarct.  L axis deviation.    Echo 11/2020:  1. Left ventricular ejection fraction, by estimation, is >75%. The left  ventricle has hyperdynamic function. The left ventricle has no regional  wall motion abnormalities. Left ventricular diastolic parameters are  consistent with Grade I diastolic  dysfunction (impaired relaxation).   2. Right ventricular systolic function is normal. The right ventricular  size is normal.   3. The mitral valve is normal in structure. Trivial mitral valve  regurgitation. No evidence of mitral stenosis. Moderate mitral annular  calcification.   4. The aortic valve is normal in structure. Aortic valve regurgitation is  trivial. No aortic stenosis is present.   5. The inferior vena cava is normal in size with greater than 50%  respiratory variability, suggesting right atrial pressure of 3 mmHg.    Lexiscan Cardiolite 12/07/14: Nuclear stress EF: 67%. The left ventricular ejection fraction is  hyperdynamic (>65%). There was no ST  segment deviation noted during stress. This is a low risk study.   Technically suboptimal due to subdiaphragmatic activity; low risk stress nuclear study with a medium size, severe intensity, fixed inferior apical defect suggestive of prior infarct; no ischemia; EF 67 with normal wall motion.  Carotid Doppler 12/07/14:   1-39% stenosis of bilateral common carotid arteries.  recommended follow-up 11/2016.  Recent Labs: 11/19/2020: ALT 14; TSH 1.643 11/20/2020: Magnesium 1.8 11/22/2020: Hemoglobin 9.1; Platelets 179 11/23/2020: BUN 11; Creatinine, Ser 0.63; Potassium 4.4; Sodium 132    Lipid Panel    Component Value Date/Time   CHOL 178 09/06/2020 1608   TRIG 92.0 09/06/2020 1608   TRIG 40 12/25/2005 1439   HDL 52.40 09/06/2020 1608   CHOLHDL 3 09/06/2020 1608   VLDL 18.4 09/06/2020 1608   LDLCALC 107 (H) 09/06/2020 1608   LDLDIRECT 170.9 03/20/2010 1356      Wt Readings from Last 3 Encounters:  12/19/20 128 lb (58.1 kg)  12/15/20 123 lb 3.2 oz (55.9 kg)  11/23/20 129 lb (58.5 kg)      ASSESSMENT AND PLAN:  Carotid artery disease (HCC) Carotid bruit noted on the right.  She has a history of carotid stenosis and her lipids have not been at goal.  Repeat carotid Dopplers.  Continue aspirin and atorvastatin.  She will start back taking the atorvastatin more regularly and we will recheck labs in 3 months.  Subdural hematoma Stable on imaging.  She was evaluated by neurology and is okay to be on aspirin 81 mg.  HLD (hyperlipidemia) Lipids are not at goal.  Continue atorvastatin 40 mg.  She has not been taking it regularly.  She will come back for fasting lipids and a CMP in 3 months.  Her LDL goal is less than 70 given that she has carotid stenosis.  Syncope She had an episode of syncope in the setting of orthostatic hypotension.  She has been working on increasing her fluid intake and food intake.  She is also been increasing the salt in  her diet.  She was unable to wear the abdominal binder because it was too big.  Her compression socks have been too tight.  I recommended that she get low-dose compression socks and try again.  Continue with her current interventions.  She seems to be doing better.  Current medicines are reviewed at length with the patient today.  The patient does not have concerns regarding medicines.  The following changes have been made:  no change  Labs/ tests ordered today include:   Orders Placed This Encounter  Procedures   Lipid panel   Comprehensive metabolic panel   VAS US CAROTID      Disposition:   FU with Harald Quevedo C. Oval Linsey, MD in 3 months.    Signed, Skeet Latch, MD  12/19/2020 5:59 PM    [facility]

## 2020-12-19 NOTE — Assessment & Plan Note (Signed)
Stable on imaging.  She was evaluated by neurology and is okay to be on aspirin 81 mg.

## 2020-12-19 NOTE — Assessment & Plan Note (Signed)
Lipids are not at goal.  Continue atorvastatin 40 mg.  She has not been taking it regularly.  She will come back for fasting lipids and a CMP in 3 months.  Her LDL goal is less than 70 given that she has carotid stenosis.

## 2020-12-19 NOTE — Assessment & Plan Note (Signed)
Carotid bruit noted on the right.  She has a history of carotid stenosis and her lipids have not been at goal.  Repeat carotid Dopplers.  Continue aspirin and atorvastatin.  She will start back taking the atorvastatin more regularly and we will recheck labs in 3 months.

## 2020-12-19 NOTE — Assessment & Plan Note (Signed)
She had an episode of syncope in the setting of orthostatic hypotension.  She has been working on increasing her fluid intake and food intake.  She is also been increasing the salt in her diet.  She was unable to wear the abdominal binder because it was too big.  Her compression socks have been too tight.  I recommended that she get low-dose compression socks and try again.  Continue with her current interventions.  She seems to be doing better.

## 2020-12-19 NOTE — Patient Instructions (Addendum)
Medication Instructions:  TRY TO TAKE YOUR ATORVASTATIN MORE REGULARLY   *If you need a refill on your cardiac medications before your next appointment, please call your pharmacy*  Lab Work: FASTING LP/CMET IN 3 MONTHS PRIOR TO FOLLOW UP   If you have labs (blood work) drawn today and your tests are completely normal, you will receive your results only by: [place] (if you have MyChart) OR A paper copy in the mail If you have any lab test that is abnormal or we need to change your treatment, we will call you to review the results.  Testing/Procedures: Your physician has requested that you have a carotid duplex. This test is an ultrasound of the carotid arteries in your neck. It looks at blood flow through these arteries that supply the brain with blood. Allow one hour for this exam. There are no restrictions or special instructions.  Follow-Up: At [facility], you and your health needs are our priority.  As part of our continuing mission to provide you with exceptional heart care, we have created designated Provider Care Teams.  These Care Teams include your primary Cardiologist (physician) and Advanced Practice Providers (APPs -  Physician Assistants and Nurse Practitioners) who all work together to provide you with the care you need, when you need it.  We recommend signing up for the patient portal called "MyChart".  Sign up information is provided on this After Visit Summary.  MyChart is used to connect with patients for Virtual Visits (Telemedicine).  Patients are able to view lab/test results, encounter notes, upcoming appointments, etc.  Non-urgent messages can be sent to your provider as well.   To learn more about what you can do with MyChart, go to [email or web address].    Your next appointment:   3 month(s) AFTER LABS   The format for your next appointment:   In Person  Provider:   Skeet Latch, MD   Other Instructions  WEAR THE COMPRESSION STOCKINGS,  LIGHTEST COMPRESSION

## 2020-12-21 ENCOUNTER — Ambulatory Visit (HOSPITAL_BASED_OUTPATIENT_CLINIC_OR_DEPARTMENT_OTHER): Payer: Medicare Other | Admitting: Family

## 2020-12-29 ENCOUNTER — Other Ambulatory Visit: Payer: Self-pay

## 2020-12-29 ENCOUNTER — Ambulatory Visit (INDEPENDENT_AMBULATORY_CARE_PROVIDER_SITE_OTHER): Payer: Medicare Other

## 2020-12-29 DIAGNOSIS — I6523 Occlusion and stenosis of bilateral carotid arteries: Secondary | ICD-10-CM

## 2021-01-11 DIAGNOSIS — M1612 Unilateral primary osteoarthritis, left hip: Secondary | ICD-10-CM | POA: Diagnosis not present

## 2021-01-11 DIAGNOSIS — M810 Age-related osteoporosis without current pathological fracture: Secondary | ICD-10-CM | POA: Diagnosis not present

## 2021-01-19 ENCOUNTER — Ambulatory Visit (HOSPITAL_BASED_OUTPATIENT_CLINIC_OR_DEPARTMENT_OTHER): Payer: Medicare Other | Admitting: Cardiovascular Disease

## 2021-02-09 DIAGNOSIS — Z20822 Contact with and (suspected) exposure to covid-19: Secondary | ICD-10-CM | POA: Diagnosis not present

## 2021-02-22 DIAGNOSIS — M25562 Pain in left knee: Secondary | ICD-10-CM | POA: Diagnosis not present

## 2021-03-02 DIAGNOSIS — M25562 Pain in left knee: Secondary | ICD-10-CM | POA: Diagnosis not present

## 2021-03-08 DIAGNOSIS — M25562 Pain in left knee: Secondary | ICD-10-CM | POA: Diagnosis not present

## 2021-03-10 DIAGNOSIS — Z20822 Contact with and (suspected) exposure to covid-19: Secondary | ICD-10-CM | POA: Diagnosis not present

## 2021-03-21 ENCOUNTER — Other Ambulatory Visit: Payer: Self-pay | Admitting: Internal Medicine

## 2021-03-21 DIAGNOSIS — G4701 Insomnia due to medical condition: Secondary | ICD-10-CM

## 2021-03-21 DIAGNOSIS — G8929 Other chronic pain: Secondary | ICD-10-CM

## 2021-03-23 ENCOUNTER — Ambulatory Visit (HOSPITAL_BASED_OUTPATIENT_CLINIC_OR_DEPARTMENT_OTHER): Payer: Medicare Other | Admitting: Cardiovascular Disease

## 2021-03-23 DIAGNOSIS — Z20822 Contact with and (suspected) exposure to covid-19: Secondary | ICD-10-CM | POA: Diagnosis not present

## 2021-03-31 DIAGNOSIS — Z20822 Contact with and (suspected) exposure to covid-19: Secondary | ICD-10-CM | POA: Diagnosis not present

## 2021-05-03 DIAGNOSIS — Z20828 Contact with and (suspected) exposure to other viral communicable diseases: Secondary | ICD-10-CM | POA: Diagnosis not present

## 2021-05-12 DIAGNOSIS — Z20822 Contact with and (suspected) exposure to covid-19: Secondary | ICD-10-CM | POA: Diagnosis not present

## 2021-05-13 DIAGNOSIS — Z20822 Contact with and (suspected) exposure to covid-19: Secondary | ICD-10-CM | POA: Diagnosis not present

## 2021-05-15 ENCOUNTER — Ambulatory Visit (INDEPENDENT_AMBULATORY_CARE_PROVIDER_SITE_OTHER): Payer: Medicare Other

## 2021-05-15 DIAGNOSIS — Z0000 Encounter for general adult medical examination without abnormal findings: Secondary | ICD-10-CM

## 2021-05-15 DIAGNOSIS — Z20822 Contact with and (suspected) exposure to covid-19: Secondary | ICD-10-CM | POA: Diagnosis not present

## 2021-05-15 NOTE — Progress Notes (Signed)
?I connected with Hailey [name] today by telephone and verified that I am speaking with the correct person using two identifiers. ?Location patient: home ?Location provider: work ?Persons participating in the virtual visit: patient, provider. ?  ?I discussed the limitations, risks, security and privacy concerns of performing an evaluation and management service by telephone and the availability of in person appointments. I also discussed with the patient that there may be a patient responsible charge related to this service. The patient expressed understanding and verbally consented to this telephonic visit.  ?  ?Interactive audio and video telecommunications were attempted between this provider and patient, however failed, due to patient having technical difficulties OR patient did not have access to video capability.  We continued and completed visit with audio only. ? ?Some vital signs may be absent or patient reported.  ? ?Time Spent with patient on telephone encounter: 30 minutes ? ?Subjective:  ? Hailey [name] is a 81 y.o. female who presents for Medicare Annual (Subsequent) preventive examination. ? ?Review of Systems    ? ?Cardiac Risk Factors include: advanced age (>59mn, >>52women);dyslipidemia;hypertension;family history of premature cardiovascular disease ? ?   ?Objective:  ?  ?There were no vitals filed for this visit. ?There is no height or weight on file to calculate BMI. ? ? ?  05/15/2021  ?  2:58 PM 11/19/2020  ?  5:00 PM 11/18/2020  ? 12:27 PM 10/18/2020  ?  1:57 PM 10/05/2020  ?  9:40 AM 12/10/2017  ? 11:56 AM 12/03/2017  ?  4:01 PM  ?Advanced Directives  ?Does Patient Have a Medical Advance Directive? Yes No No Yes Yes Yes Yes  ?Type of Advance Directive Living will;Healthcare Power of Attorney   Living will;Healthcare Power of Attorney Living will;Healthcare Power of Attorney Living will Living will  ?Does patient want to make changes to medical advance directive? No - Patient declined   No - Patient  declined  No - Patient declined No - Patient declined  ?Copy of [place]? No - copy requested        ?Would patient like information on creating a medical advance directive?  No - Patient declined       ? ? ?Current Medications (verified) ?Outpatient Encounter Medications as of 05/15/2021  ?Medication Sig  ? acetaminophen (TYLENOL) 500 MG tablet Take 2 tablets (1,000 mg total) by mouth every 8 (eight) hours as needed for mild pain or moderate pain.  ? aspirin EC 81 MG tablet Take 1 tablet (81 mg total) by mouth 2 (two) times daily. For DVT prophylaxis for 30 days after surgery.  ? atorvastatin (LIPITOR) 40 MG tablet Take 1 tablet (40 mg total) by mouth daily.  ? denosumab (PROLIA) 60 MG/ML SOLN injection Inject 60 mg into the skin every 6 (six) months. Administer in upper arm, thigh, or abdomen  ? diclofenac sodium (VOLTAREN) 1 % GEL Apply 2 g topically at bedtime.  ? escitalopram (LEXAPRO) 20 MG tablet Take 1 tablet (20 mg total) by mouth daily.  ? fluticasone (FLONASE) 50 MCG/ACT nasal spray Use 1 spray each nostril 1-2 times daily as needed (Patient taking differently: Place 1 spray into both nostrils 2 (two) times daily.)  ? gabapentin (NEURONTIN) 300 MG capsule Take 1 capsule (300 mg total) by mouth daily.  ? loperamide (IMODIUM) 2 MG capsule Take 2 mg by mouth as needed for diarrhea or loose stools.  ? LORazepam (ATIVAN) 1 MG tablet Take 1 tablet (1 mg total)  by mouth at bedtime.  ? meloxicam (MOBIC) 15 MG tablet Take 1 tablet (15 mg total) by mouth daily as needed for pain (and inflammation).  ? methocarbamol (ROBAXIN) 500 MG tablet Take 1 tablet (500 mg total) by mouth every 8 (eight) hours as needed for muscle spasms.  ? ondansetron (ZOFRAN ODT) 4 MG disintegrating tablet Take 1 tablet (4 mg total) by mouth 2 (two) times daily as needed for nausea or vomiting.  ? traMADol-acetaminophen (ULTRACET) 37.5-325 MG tablet Take 1 tablet by mouth 2 (two) times daily as needed.  ? zolpidem  (AMBIEN) 5 MG tablet Take 1 tablet (5 mg total) by mouth at bedtime as needed.  ? ?No facility-administered encounter medications on file as of 05/15/2021.  ? ? ?Allergies (verified) ?Resorcinol  ? ?History: ?Past Medical History:  ?Diagnosis Date  ? Allergic rhinitis, cause unspecified   ? Carotid artery disease ([place]) 12/13/2014  ? 1-39% stenosis of bilateral CCAs.   Repeat ultrasound 11/2016.  ? Cervical spine fracture ([place]) 02/01/2014  ? Chronic insomnia   ? Chronic interstitial cystitis   ? Closed fracture of rib(s), unspecified   ? Depression   ? Disorder of bone and cartilage, unspecified   ? Esophageal reflux   ? Injury to unspecified nerve of pelvic girdle and lower limb(956.9)   ? Left rotator cuff tear arthropathy 12/10/2017  ? OSA (obstructive sleep apnea)   ? Osteoarthrosis, unspecified whether generalized or localized, unspecified site   ? Other and unspecified hyperlipidemia   ? Personal history of colonic adenomas 11/28/2012  ? Personal history of other disorders of nervous system and sense organs   ? SAH (subarachnoid hemorrhage) ([place]) 02/02/2014  ? Unspecified closed fracture of pelvis   ? x 2 2004/2006  ? Unspecified essential hypertension   ? ?Past Surgical History:  ?Procedure Laterality Date  ? ANKLE FUSION Left   ? BACK SURGERY    ? ; has  had 8 back surgeries  ? CERVICAL FUSION    ? C4-7  ? COLONOSCOPY    ? CYSTOSCOPY    ? HERNIA REPAIR    ? umbilical  ? LUMBAR FUSION    ? L2-3  ? REVERSE SHOULDER ARTHROPLASTY Left 12/10/2017  ? Procedure: LEFT REVERSE SHOULDER ARTHROPLASTY;  Surgeon: Marchia Bond, MD;  Location: [facility];  Service: Orthopedics;  Laterality: Left;  Interscalene Block  ? SACRAL FUSION  07/2012  ? TIBIA FRACTURE SURGERY Right   ? TONSILLECTOMY    ? TOTAL HIP ARTHROPLASTY Right 2012  ? TOTAL HIP ARTHROPLASTY Left 10/18/2020  ? Procedure: TOTAL HIP ARTHROPLASTY ANTERIOR APPROACH;  Surgeon: Renette Butters, MD;  Location: [facility];  Service: Orthopedics;  Laterality: Left;  ? TUBAL  LIGATION    ? UPPER GASTROINTESTINAL ENDOSCOPY    ? ?Family History  ?Problem Relation Age of Onset  ? Dementia Mother   ? Hypertension Father   ? Heart attack Father   ? Allergic rhinitis Father   ? Cancer Maternal Aunt   ?     breast cancer  ? Alzheimer's disease Maternal Aunt   ? Stroke Paternal Uncle   ? Cancer Cousin   ?     breast cancer  ? Colon cancer Neg Hx   ? Angioedema Neg Hx   ? Asthma Neg Hx   ? Eczema Neg Hx   ? ?Social History  ? ?Socioeconomic History  ? Marital status: Widowed  ?  Spouse name: Not on file  ? Number of children: Not on file  ?  Years of education: Not on file  ? Highest education level: Not on file  ?Occupational History  ? Occupation: OWNER  ?  Employer: [employer]  ?  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 Smoking status: Former  ?  Packs/day: 2.00  ?  Years: 10.00  ?  Pack years: 20.00  ?  Types: Cigarettes  ?  Quit date: 02/13/1967  ?  Years since quitting: 54.2  ? Smokeless tobacco: Never  ? Tobacco comments:  ?  smoked 1961-1969 , up to 3 ppd, mainly 2 ppd  ?Vaping Use  ? Vaping Use: Never used  ?Substance and Sexual Activity  ? Alcohol use: No  ? Drug use: No  ? Sexual activity: Never  ?Other Topics Concern  ? Not on file  ?Social History Narrative  ? Not on file  ? ?Social Determinants of Health  ? ?Financial Resource Strain: Low Risk   ? Difficulty of Paying Living Expenses: Not hard at all  ?Food Insecurity: No Food Insecurity  ? Worried About Charity fundraiser in the Last Year: Never true  ? Ran Out of Food in the Last Year: Never true  ?Transportation Needs: No Transportation Needs  ? Lack of Transportation (Medical): No  ? Lack of Transportation (Non-Medical): No  ?Physical Activity: Inactive  ? Days of Exercise per Week: 0 days  ? Minutes of Exercise per Session: 0 min  ?Stress: No Stress Concern Present  ? Feeling of Stress : Not at all  ?Social Connections: Unknown  ? Frequency of Communication with Friends and Family: More than three times a week  ?  Frequency of Social Gatherings with Friends and Family: More than three times a week  ? Attends Religious Services: Patient refused  ? Active Member of Clubs or Organizations: Patient refused  ? Attends Club or Or

## 2021-05-15 NOTE — Patient Instructions (Signed)
Hailey [name] , ?Thank you for taking time to come for your Medicare Wellness Visit. I appreciate your ongoing commitment to your health goals. Please review the following plan we discussed and let me know if I can assist you in the future.  ? ?Screening recommendations/referrals: ?Colonoscopy: Not a candidate for screening due to age ?Mammogram: 01/16/2020; due every 1-2 years ?Bone Density: 12/26/2019; due every 2 years ?Recommended yearly ophthalmology/optometry visit for glaucoma screening and checkup ?Recommended yearly dental visit for hygiene and checkup ? ?Vaccinations: ?Influenza vaccine: 11/23/2020 ?Pneumococcal vaccine: 01/18/2012, 11/07/2014 ?Tdap vaccine: 12/16/2014; due every 10 years ?Shingles vaccine: 03/17/2017, 08/21/2017   ?Covid-19: 02/23/2019, 03/23/2019 ? ?Advanced directives: Please bring a copy of your health care power of attorney and living will to the office at your convenience. ? ?Conditions/risks identified: Yes ? ?Next appointment: Please schedule your next Medicare Wellness Visit with your Nurse Health Advisor in 1 year or 366 days by calling [phone]. ? ? ?Preventive Care 55 Years and Older, Female ?Preventive care refers to lifestyle choices and visits with your health care provider that can promote health and wellness. ?What does preventive care include? ?A yearly physical exam. This is also called an annual well check. ?Dental exams once or twice a year. ?Routine eye exams. Ask your health care provider how often you should have your eyes checked. ?Personal lifestyle choices, including: ?Daily care of your teeth and gums. ?Regular physical activity. ?Eating a healthy diet. ?Avoiding tobacco and drug use. ?Limiting alcohol use. ?Practicing safe sex. ?Taking low-dose aspirin every day. ?Taking vitamin and mineral supplements as recommended by your health care provider. ?What happens during an annual well check? ?The services and screenings done by your health care provider during your annual  well check will depend on your age, overall health, lifestyle risk factors, and family history of disease. ?Counseling  ?Your health care provider may ask you questions about your: ?Alcohol use. ?Tobacco use. ?Drug use. ?Emotional well-being. ?Home and relationship well-being. ?Sexual activity. ?Eating habits. ?History of falls. ?Memory and ability to understand (cognition). ?Work and work Statistician. ?Reproductive health. ?Screening  ?You may have the following tests or measurements: ?Height, weight, and BMI. ?Blood pressure. ?Lipid and cholesterol levels. These may be checked every 5 years, or more frequently if you are over 39 years old. ?Skin check. ?Lung cancer screening. You may have this screening every year starting at age 48 if you have a 30-pack-year history of smoking and currently smoke or have quit within the past 15 years. ?Fecal occult blood test (FOBT) of the stool. You may have this test every year starting at age 5. ?Flexible sigmoidoscopy or colonoscopy. You may have a sigmoidoscopy every 5 years or a colonoscopy every 10 years starting at age 36. ?Hepatitis C blood test. ?Hepatitis B blood test. ?Sexually transmitted disease (STD) testing. ?Diabetes screening. This is done by checking your blood sugar (glucose) after you have not eaten for a while (fasting). You may have this done every 1-3 years. ?Bone density scan. This is done to screen for osteoporosis. You may have this done starting at age 57. ?Mammogram. This may be done every 1-2 years. Talk to your health care provider about how often you should have regular mammograms. ?Talk with your health care provider about your test results, treatment options, and if necessary, the need for more tests. ?Vaccines  ?Your health care provider may recommend certain vaccines, such as: ?Influenza vaccine. This is recommended every year. ?Tetanus, diphtheria, and acellular pertussis (Tdap, Td) vaccine. You  may need a Td booster every 10 years. ?Zoster  vaccine. You may need this after age 6. ?Pneumococcal 13-valent conjugate (PCV13) vaccine. One dose is recommended after age 21. ?Pneumococcal polysaccharide (PPSV23) vaccine. One dose is recommended after age 70. ?Talk to your health care provider about which screenings and vaccines you need and how often you need them. ?This information is not intended to replace advice given to you by your health care provider. Make sure you discuss any questions you have with your health care provider. ?Document Released: 02/25/2015 Document Revised: 10/19/2015 Document Reviewed: 11/30/2014 ?Elsevier Interactive Patient Education ? 2017 [employer]. ? ?Fall Prevention in the Home ?Falls can cause injuries. They can happen to people of all ages. There are many things you can do to make your home safe and to help prevent falls. ?What can I do on the outside of my home? ?Regularly fix the edges of walkways and driveways and fix any cracks. ?Remove anything that might make you trip as you walk through a door, such as a raised step or threshold. ?Trim any bushes or trees on the path to your home. ?Use bright outdoor lighting. ?Clear any walking paths of anything that might make someone trip, such as rocks or tools. ?Regularly check to see if handrails are loose or broken. Make sure that both sides of any steps have handrails. ?Any raised decks and porches should have guardrails on the edges. ?Have any leaves, snow, or ice cleared regularly. ?Use sand or salt on walking paths during winter. ?Clean up any spills in your garage right away. This includes oil or grease spills. ?What can I do in the bathroom? ?Use night lights. ?Install grab bars by the toilet and in the tub and shower. Do not use towel bars as grab bars. ?Use non-skid mats or decals in the tub or shower. ?If you need to sit down in the shower, use a plastic, non-slip stool. ?Keep the floor dry. Clean up any water that spills on the floor as soon as it happens. ?Remove  soap buildup in the tub or shower regularly. ?Attach bath mats securely with double-sided non-slip rug tape. ?Do not have throw rugs and other things on the floor that can make you trip. ?What can I do in the bedroom? ?Use night lights. ?Make sure that you have a light by your bed that is easy to reach. ?Do not use any sheets or blankets that are too big for your bed. They should not hang down onto the floor. ?Have a firm chair that has side arms. You can use this for support while you get dressed. ?Do not have throw rugs and other things on the floor that can make you trip. ?What can I do in the kitchen? ?Clean up any spills right away. ?Avoid walking on wet floors. ?Keep items that you use a lot in easy-to-reach places. ?If you need to reach something above you, use a strong step stool that has a grab bar. ?Keep electrical cords out of the way. ?Do not use floor polish or wax that makes floors slippery. If you must use wax, use non-skid floor wax. ?Do not have throw rugs and other things on the floor that can make you trip. ?What can I do with my stairs? ?Do not leave any items on the stairs. ?Make sure that there are handrails on both sides of the stairs and use them. Fix handrails that are broken or loose. Make sure that handrails are as long as the  stairways. ?Check any carpeting to make sure that it is firmly attached to the stairs. Fix any carpet that is loose or worn. ?Avoid having throw rugs at the top or bottom of the stairs. If you do have throw rugs, attach them to the floor with carpet tape. ?Make sure that you have a light switch at the top of the stairs and the bottom of the stairs. If you do not have them, ask someone to add them for you. ?What else can I do to help prevent falls? ?Wear shoes that: ?Do not have high heels. ?Have rubber bottoms. ?Are comfortable and fit you well. ?Are closed at the toe. Do not wear sandals. ?If you use a stepladder: ?Make sure that it is fully opened. Do not climb a  closed stepladder. ?Make sure that both sides of the stepladder are locked into place. ?Ask someone to hold it for you, if possible. ?Clearly mark and make sure that you can see: ?Any grab bars or handrai

## 2021-05-17 DIAGNOSIS — Z20822 Contact with and (suspected) exposure to covid-19: Secondary | ICD-10-CM | POA: Diagnosis not present

## 2021-05-26 DIAGNOSIS — Z20822 Contact with and (suspected) exposure to covid-19: Secondary | ICD-10-CM | POA: Diagnosis not present

## 2021-05-29 DIAGNOSIS — Z20822 Contact with and (suspected) exposure to covid-19: Secondary | ICD-10-CM | POA: Diagnosis not present

## 2021-05-30 DIAGNOSIS — Z20822 Contact with and (suspected) exposure to covid-19: Secondary | ICD-10-CM | POA: Diagnosis not present

## 2021-06-01 DIAGNOSIS — Z20822 Contact with and (suspected) exposure to covid-19: Secondary | ICD-10-CM | POA: Diagnosis not present

## 2021-06-12 DIAGNOSIS — Z20822 Contact with and (suspected) exposure to covid-19: Secondary | ICD-10-CM | POA: Diagnosis not present

## 2021-06-15 DIAGNOSIS — Z20822 Contact with and (suspected) exposure to covid-19: Secondary | ICD-10-CM | POA: Diagnosis not present

## 2021-06-21 ENCOUNTER — Other Ambulatory Visit: Payer: Self-pay | Admitting: Internal Medicine

## 2021-06-21 DIAGNOSIS — H04123 Dry eye syndrome of bilateral lacrimal glands: Secondary | ICD-10-CM | POA: Diagnosis not present

## 2021-06-21 DIAGNOSIS — Z20822 Contact with and (suspected) exposure to covid-19: Secondary | ICD-10-CM | POA: Diagnosis not present

## 2021-06-21 DIAGNOSIS — H353131 Nonexudative age-related macular degeneration, bilateral, early dry stage: Secondary | ICD-10-CM | POA: Diagnosis not present

## 2021-06-21 DIAGNOSIS — H2513 Age-related nuclear cataract, bilateral: Secondary | ICD-10-CM | POA: Diagnosis not present

## 2021-06-21 NOTE — Telephone Encounter (Signed)
She is due for visit every 6 months and would be due for this refill ?

## 2021-06-21 NOTE — Telephone Encounter (Signed)
Per PMP, last refill was 03/30/21. ?

## 2021-06-22 DIAGNOSIS — Z20822 Contact with and (suspected) exposure to covid-19: Secondary | ICD-10-CM | POA: Diagnosis not present

## 2021-06-27 DIAGNOSIS — M25562 Pain in left knee: Secondary | ICD-10-CM | POA: Diagnosis not present

## 2021-06-27 DIAGNOSIS — G8929 Other chronic pain: Secondary | ICD-10-CM | POA: Diagnosis not present

## 2021-06-27 DIAGNOSIS — M25561 Pain in right knee: Secondary | ICD-10-CM | POA: Diagnosis not present

## 2021-06-28 DIAGNOSIS — Z5181 Encounter for therapeutic drug level monitoring: Secondary | ICD-10-CM | POA: Diagnosis not present

## 2021-06-28 DIAGNOSIS — E7841 Elevated Lipoprotein(a): Secondary | ICD-10-CM | POA: Diagnosis not present

## 2021-06-29 LAB — COMPREHENSIVE METABOLIC PANEL
ALT: 15 IU/L (ref 0–32)
AST: 18 IU/L (ref 0–40)
Albumin/Globulin Ratio: 2.4 — ABNORMAL HIGH (ref 1.2–2.2)
Albumin: 4.7 g/dL (ref 3.7–4.7)
Alkaline Phosphatase: 37 IU/L — ABNORMAL LOW (ref 44–121)
BUN/Creatinine Ratio: 25 (ref 12–28)
BUN: 17 mg/dL (ref 8–27)
Bilirubin Total: 0.3 mg/dL (ref 0.0–1.2)
CO2: 28 mmol/L (ref 20–29)
Calcium: 9.7 mg/dL (ref 8.7–10.3)
Chloride: 98 mmol/L (ref 96–106)
Creatinine, Ser: 0.67 mg/dL (ref 0.57–1.00)
Globulin, Total: 2 g/dL (ref 1.5–4.5)
Glucose: 97 mg/dL (ref 70–99)
Potassium: 4.1 mmol/L (ref 3.5–5.2)
Sodium: 136 mmol/L (ref 134–144)
Total Protein: 6.7 g/dL (ref 6.0–8.5)
eGFR: 88 mL/min/{1.73_m2} (ref >59)

## 2021-06-29 LAB — LIPID PANEL
Chol/HDL Ratio: 2.8 ratio (ref 0.0–4.4)
Cholesterol, Total: 173 mg/dL (ref 100–199)
HDL: 62 mg/dL (ref >39)
LDL Chol Calc (NIH): 96 mg/dL (ref 0–99)
Triglycerides: 81 mg/dL (ref 0–149)
VLDL Cholesterol Cal: 15 mg/dL (ref 5–40)

## 2021-07-03 ENCOUNTER — Telehealth (HOSPITAL_BASED_OUTPATIENT_CLINIC_OR_DEPARTMENT_OTHER): Payer: Self-pay | Admitting: *Deleted

## 2021-07-03 ENCOUNTER — Other Ambulatory Visit: Payer: Self-pay | Admitting: Internal Medicine

## 2021-07-03 ENCOUNTER — Telehealth: Payer: Self-pay | Admitting: Internal Medicine

## 2021-07-03 DIAGNOSIS — E785 Hyperlipidemia, unspecified: Secondary | ICD-10-CM

## 2021-07-03 DIAGNOSIS — Z5181 Encounter for therapeutic drug level monitoring: Secondary | ICD-10-CM

## 2021-07-03 MED ORDER — ATORVASTATIN CALCIUM 80 MG PO TABS: 40.0000 mg | ORAL_TABLET | Freq: Every day | ORAL | 3 refills | Status: DC

## 2021-07-03 NOTE — Telephone Encounter (Signed)
Patient called stating that she is in a lot of pain and needs her tramadol and gabapentin sent to pharmacy. She states that the provider has canceled her last two appointments. Requests refills be sent to [facility] for delivery.

## 2021-07-03 NOTE — Telephone Encounter (Signed)
-----   Message from Skeet Latch, MD sent at 07/02/2021 10:12 PM EDT ----- Normal kidney and liver function.  Cholesterol levels are a little higher than recommended.  Her LDL needs to be <70.  Increase atorvastatin to '80mg'$ .  Repeat lipids/CMP in 2-3 months.

## 2021-07-03 NOTE — Telephone Encounter (Signed)
Tried to call but mailbox is full. Released in mychart with Dr Blenda Mounts comments plus message about where Rx sent and mailed lab orders

## 2021-07-04 ENCOUNTER — Encounter (HOSPITAL_BASED_OUTPATIENT_CLINIC_OR_DEPARTMENT_OTHER): Payer: Self-pay | Admitting: Cardiovascular Disease

## 2021-07-04 ENCOUNTER — Ambulatory Visit (INDEPENDENT_AMBULATORY_CARE_PROVIDER_SITE_OTHER): Payer: Medicare Other | Admitting: Cardiovascular Disease

## 2021-07-04 DIAGNOSIS — I10 Essential (primary) hypertension: Secondary | ICD-10-CM | POA: Diagnosis not present

## 2021-07-04 DIAGNOSIS — E7841 Elevated Lipoprotein(a): Secondary | ICD-10-CM | POA: Diagnosis not present

## 2021-07-04 MED ORDER — ATORVASTATIN CALCIUM 40 MG PO TABS: 40.0000 mg | ORAL_TABLET | Freq: Every day | ORAL | 3 refills | Status: DC

## 2021-07-04 NOTE — Assessment & Plan Note (Signed)
Blood pressures well controlled.  She is going to work on increasing her exercise to at least 150 minutes weekly.

## 2021-07-04 NOTE — Patient Instructions (Signed)
Medication Instructions:  Your physician recommends that you continue on your current medications as directed. Please refer to the Current Medication list given to you today.   *If you need a refill on your cardiac medications before your next appointment, please call your pharmacy*  Lab Work: FASTING LP/CMET IN 3 MONTHS   If you have labs (blood work) drawn today and your tests are completely normal, you will receive your results only by: [place] (if you have MyChart) OR A paper copy in the mail If you have any lab test that is abnormal or we need to change your treatment, we will call you to review the results.  Testing/Procedures: NONE  Follow-Up: At [facility], you and your health needs are our priority.  As part of our continuing mission to provide you with exceptional heart care, we have created designated Provider Care Teams.  These Care Teams include your primary Cardiologist (physician) and Advanced Practice Providers (APPs -  Physician Assistants and Nurse Practitioners) who all work together to provide you with the care you need, when you need it.  We recommend signing up for the patient portal called "MyChart".  Sign up information is provided on this After Visit Summary.  MyChart is used to connect with patients for Virtual Visits (Telemedicine).  Patients are able to view lab/test results, encounter notes, upcoming appointments, etc.  Non-urgent messages can be sent to your provider as well.   To learn more about what you can do with MyChart, go to [email or web address].    Your next appointment:   12 month(s)  The format for your next appointment:   In Person  Provider:   Skeet Latch, MD   Other Instructions  Exercise recommendations: The American Heart Association recommends 150 minutes of moderate intensity exercise weekly. Try 30 minutes of moderate intensity exercise 4-5 times per week. This could include walking, jogging, or  swimming.

## 2021-07-04 NOTE — Assessment & Plan Note (Signed)
LDL goal is less than 70 given that she has some carotid stenosis.  Her lipids were above goal when last checked.  She wants to work on increasing her exercise.  She has a pool and wants to start swimming.  We will repeat lipids and 3 months.  If they remain elevated then we will increase her atorvastatin.  She does report some pain in her left leg that radiates down below her ankle.  I suspect this is more related to sciatica than a statin.

## 2021-07-04 NOTE — Progress Notes (Signed)
Cardiology Office Note   Date:  07/26/2021   ID:  Hailey [name] [birth year], MRN [id number]  PCP:  Hoyt Koch, MD  Cardiologist:   Skeet Latch, MD   No chief complaint on file.    Patient ID:  Hailey [name] is a 81 y.o. female with hypertension, hyperlipidemia, subarachnoid hemorrhage, and alcohol abuse who presents for follow-up.  Hailey [name] was first seen 11/2014 with exertional dyspnea.   She had an echo 12/23/14 that showed grade 1 diastolic dysfunction and mild dilation of the ascending aorta. Sysotic function was normal and there were no wall motion abnormalities. She also had carotid Dopplers that showed 1-39% stenosis bilaterally.  At her last appointment she had experienced two episodes of syncope on 08/2015. The episode occurred upon standing up and walking to the bathroom. She was able to get back in her bed and had a recurrent event a few hours later.  She decided to stop her blood pressure medications and has not had any recurrent symptoms.  This was in the setting of a 20 lb intentional weight loss.   Since her last appointment Hailey [name] has been feeling well.  Her only complaint is intermittent episodes of severe pain in her anterior neck.  This occurs sporadically and typically when she is turning her head.  She notices it most when getting in bed at night.  The episodes last for approximately 30 seconds and are not associated with exertion.  There is no shortness of breath.  It feels like it might be a muscle spasming.  She has had 2 neck surgeries, most recently 04/2013.  She has no chest pain or shortness of breath.  She has not experienced any lower extremity edema, orthopnea, or PND.  She has not been exercising much lately.  Hailey [name] was admitted 11/2020 with an episode of syncope.  It was thought to be due to orthostatic hypotension.  Her blood pressures lying down were 154 systolic.  She had a 30-40 point drop with positional change.  She was placed on  compression stockings and an abdominal binder.  Echo that admission revealed LVEF greater than 75% with grade 1 diastolic dysfunction.  Head CT revealed an acute subdural hematoma.  Imaging was stable on MRI so she did not require any intervention.  She was restarted back on aspirin. We recommended her to start wearing compression stockings and that she start back taking her atorvastatin more regularly.  Today, she reports she is doing well. She complains of chronic knee pain due to osteoarthritis. She was given an injection and the knee is feeling better. She was advised to start doing exercises in the indoor pool she has. Also complains of hip pain that radiates down her legs. From a cardiology standpoint, she is doing good and has no exertional problems while swimming. She has started taking atorvastatin regularly and is managing a healthy diet.   She denies chest pain, shortness of breath, palpitations, lightheadedness, headaches, syncope, LE edema, orthopnea, PND.   Past Medical History:  Diagnosis Date   Allergic rhinitis, cause unspecified    Carotid artery disease ([place]) 12/13/2014   1-39% stenosis of bilateral CCAs.   Repeat ultrasound 11/2016.   Cervical spine fracture ([facility]) 02/01/2014   Chronic insomnia    Chronic interstitial cystitis    Closed fracture of rib(s), unspecified    Depression    Disorder of bone and cartilage, unspecified    Esophageal reflux    Injury to  unspecified nerve of pelvic girdle and lower limb(956.9)    Left rotator cuff tear arthropathy 12/10/2017   OSA (obstructive sleep apnea)    Osteoarthrosis, unspecified whether generalized or localized, unspecified site    Other and unspecified hyperlipidemia    Personal history of colonic adenomas 11/28/2012   Personal history of other disorders of nervous system and sense organs    SAH (subarachnoid hemorrhage) ([place]) 02/02/2014   Unspecified closed fracture of pelvis    x 2 2004/2006   Unspecified essential  hypertension     Past Surgical History:  Procedure Laterality Date   ANKLE FUSION Left    BACK SURGERY     ; has  had 8 back surgeries   CERVICAL FUSION     C4-7   COLONOSCOPY     CYSTOSCOPY     HERNIA REPAIR     umbilical   LUMBAR FUSION     L2-3   REVERSE SHOULDER ARTHROPLASTY Left 12/10/2017   Procedure: LEFT REVERSE SHOULDER ARTHROPLASTY;  Surgeon: Marchia Bond, MD;  Location: [facility];  Service: Orthopedics;  Laterality: Left;  Interscalene Block   SACRAL FUSION  07/2012   TIBIA FRACTURE SURGERY Right    TONSILLECTOMY     TOTAL HIP ARTHROPLASTY Right 2012   TOTAL HIP ARTHROPLASTY Left 10/18/2020   Procedure: TOTAL HIP ARTHROPLASTY ANTERIOR APPROACH;  Surgeon: Renette Butters, MD;  Location: [facility];  Service: Orthopedics;  Laterality: Left;   TUBAL LIGATION     UPPER GASTROINTESTINAL ENDOSCOPY       Current Outpatient Medications  Medication Sig Dispense Refill   acetaminophen (TYLENOL) 500 MG tablet Take 2 tablets (1,000 mg total) by mouth every 8 (eight) hours as needed for mild pain or moderate pain. 60 tablet 0   aspirin EC 81 MG tablet Take 1 tablet (81 mg total) by mouth 2 (two) times daily. For DVT prophylaxis for 30 days after surgery. 60 tablet 0   denosumab (PROLIA) 60 MG/ML SOLN injection Inject 60 mg into the skin every 6 (six) months. Administer in upper arm, thigh, or abdomen     diclofenac sodium (VOLTAREN) 1 % GEL Apply 2 g topically at bedtime.     escitalopram (LEXAPRO) 20 MG tablet Take 1 tablet (20 mg total) by mouth daily. 90 tablet 3   fluticasone (FLONASE) 50 MCG/ACT nasal spray Use 1 spray each nostril 1-2 times daily as needed (Patient taking differently: Place 1 spray into both nostrils 2 (two) times daily.) 16 g 5   loperamide (IMODIUM) 2 MG capsule Take 2 mg by mouth as needed for diarrhea or loose stools.     LORazepam (ATIVAN) 1 MG tablet Take 1 tablet (1 mg total) by mouth at bedtime. 90 tablet 1   meloxicam (MOBIC) 15 MG tablet Take 1 tablet  (15 mg total) by mouth daily as needed for pain (and inflammation). 30 tablet 0   methocarbamol (ROBAXIN) 500 MG tablet Take 1 tablet (500 mg total) by mouth every 8 (eight) hours as needed for muscle spasms. 20 tablet 0   ondansetron (ZOFRAN ODT) 4 MG disintegrating tablet Take 1 tablet (4 mg total) by mouth 2 (two) times daily as needed for nausea or vomiting. 10 tablet 0   traMADol-acetaminophen (ULTRACET) 37.5-325 MG tablet Take 1 tablet by mouth 2 (two) times daily as needed. 45 tablet 5   zolpidem (AMBIEN) 5 MG tablet Take 1 tablet (5 mg total) by mouth at bedtime as needed. 90 tablet 1   atorvastatin (LIPITOR) 40  MG tablet Take 1 tablet (40 mg total) by mouth daily. 90 tablet 3   gabapentin (NEURONTIN) 300 MG capsule Take 1 capsule (300 mg total) by mouth daily. 90 capsule 1   No current facility-administered medications for this visit.    Allergies:   Resorcinol    Social History:  The patient  reports that she quit smoking about 54 years ago. Her smoking use included cigarettes. She has a 20.00 pack-year smoking history. She has never used smokeless tobacco. She reports that she does not drink alcohol and does not use drugs.   Family History:  The patient's family history includes Allergic rhinitis in her father; Alzheimer's disease in her maternal aunt; Cancer in her cousin and maternal aunt; Dementia in her mother; Heart attack in her father; Hypertension in her father; Stroke in her paternal uncle.    ROS:  Please see the history of present illness.   Otherwise, review of systems are positive for none.   All other systems are reviewed and negative.    PHYSICAL EXAM: VS:  BP 126/76 (BP Location: Left Arm, Patient Position: Sitting, Cuff Size: Normal)   Pulse 78   Ht '5\' 7"'$  (1.702 m)   Wt 129 lb 9.6 oz (58.8 kg)   SpO2 97%   BMI 20.30 kg/m  , BMI Body mass index is 20.3 kg/m. GENERAL:  Well appearing no acute distress.   HEENT:  Pupils equal round and reactive, fundi not  visualized, oral mucosa unremarkable NECK:  No jugular venous distention, waveform within normal limits, carotid upstroke brisk and symmetric, no bruits no tenderness to palpation. LUNGS:  Clear to auscultation bilaterally.  No crackles, wheezes, or rhonchi. HEART:  RRR.  PMI not displaced or sustained,S1 and S2 within normal limits, no S3, no S4, no clicks, no rubs, no murmurs ABD:  Flat, positive bowel sounds normal in frequency in pitch, no bruits, no rebound, no guarding, no midline pulsatile mass, no hepatomegaly, no splenomegaly EXT:  2 plus pulses throughout, no edema, no cyanosis no clubbing SKIN:  No rashes no nodules NEURO:  Cranial nerves II through XII grossly intact, motor grossly intact throughout PSYCH:  Cognitively intact, oriented to person place and time   EKG:  EKG is ordered today. 11/24/15: Sinus rhythm rate 71 bpm.  Low voltage.   11/26/14:sinus rhythm at 83 BMP. Low voltage limb leads.  Prior inferior infarct.  L axis deviation.   12/13/16: Sinus rhythm.  Rate 80 bpm.  Low voltage limb leads.  Prior inferior infarct.  L axis deviation.   07/04/21: not ordered today  Vas US Carotid 12/2020:  Right Carotid: The extracranial vessels were near-normal with only minimal wall thickening or plaque.   Left Carotid: The extracranial vessels were near-normal with only minimal wall thickening or plaque.   Vertebrals:  Bilateral vertebral arteries demonstrate antegrade flow.   Subclavians: Normal flow hemodynamics were seen in bilateral subclavian arteries.   Echo 11/2020:  1. Left ventricular ejection fraction, by estimation, is >75%. The left ventricle has hyperdynamic function. The left ventricle has no regional wall motion abnormalities. Left ventricular diastolic parameters are consistent with Grade I diastolic  dysfunction (impaired relaxation).   2. Right ventricular systolic function is normal. The right ventricular size is normal.   3. The mitral valve is normal in  structure. Trivial mitral valve  regurgitation. No evidence of mitral stenosis. Moderate mitral annular calcification.   4. The aortic valve is normal in structure. Aortic valve regurgitation is trivial. No aortic  stenosis is present.   5. The inferior vena cava is normal in size with greater than 50% respiratory variability, suggesting right atrial pressure of 3 mmHg.    Lexiscan Cardiolite 12/07/14: Nuclear stress EF: 67%. The left ventricular ejection fraction is hyperdynamic (>65%). There was no ST segment deviation noted during stress. This is a low risk study.   Technically suboptimal due to subdiaphragmatic activity; low risk stress nuclear study with a medium size, severe intensity, fixed inferior apical defect suggestive of prior infarct; no ischemia; EF 67 with normal wall motion.  Carotid Doppler 12/07/14:   1-39% stenosis of bilateral common carotid arteries.  recommended follow-up 11/2016.  Recent Labs: 11/19/2020: TSH 1.643 11/20/2020: Magnesium 1.8 11/22/2020: Hemoglobin 9.1; Platelets 179 06/28/2021: ALT 15; BUN 17; Creatinine, Ser 0.67; Potassium 4.1; Sodium 136    Lipid Panel    Component Value Date/Time   CHOL 173 06/28/2021 1437   TRIG 81 06/28/2021 1437   TRIG 40 12/25/2005 1439   HDL 62 06/28/2021 1437   CHOLHDL 2.8 06/28/2021 1437   CHOLHDL 3 09/06/2020 1608   VLDL 18.4 09/06/2020 1608   LDLCALC 96 06/28/2021 1437   LDLDIRECT 170.9 03/20/2010 1356      Wt Readings from Last 3 Encounters:  07/04/21 129 lb 9.6 oz (58.8 kg)  12/19/20 128 lb (58.1 kg)  12/15/20 123 lb 3.2 oz (55.9 kg)      ASSESSMENT AND PLAN:  Essential hypertension Blood pressures well controlled.  She is going to work on increasing her exercise to at least 150 minutes weekly.  HLD (hyperlipidemia) LDL goal is less than 70 given that she has some carotid stenosis.  Her lipids were above goal when last checked.  She wants to work on increasing her exercise.  She has a pool and  wants to start swimming.  We will repeat lipids and 3 months.  If they remain elevated then we will increase her atorvastatin.  She does report some pain in her left leg that radiates down below her ankle.  I suspect this is more related to sciatica than a statin.   Current medicines are reviewed at length with the patient today.  The patient does not have concerns regarding medicines.  The following changes have been made:  no change.  Labs/ tests ordered today include: lipids and CMP in 3 months   No orders of the defined types were placed in this encounter.  Disposition:   FU with Shanen Norris C. Oval Linsey, MD in 1 year.   I,Zite Okoli,acting as a Education administrator for [place], MD.,have documented all relevant documentation on the behalf of Skeet Latch, MD,as directed by  Skeet Latch, MD while in the presence of Skeet Latch, MD.   I, [place] Oval Linsey, MD have reviewed all documentation for this visit.  The documentation of the exam, diagnosis, procedures, and orders on 07/26/2021 are all accurate and complete.    Signed, Skeet Latch, MD  07/26/2021 9:00 AM    [place]

## 2021-07-06 NOTE — Telephone Encounter (Signed)
I have an open slot today at 820 or 3/31 if she wants to come sooner. She was informed on 06/21/21 visit was required for refills and plenty of slots were available since that time.

## 2021-07-07 NOTE — Telephone Encounter (Signed)
Called pt. LDVM asking her to give our office a call back. Office number was provided.  

## 2021-07-13 ENCOUNTER — Ambulatory Visit: Payer: Medicare Other | Admitting: Internal Medicine

## 2021-07-18 ENCOUNTER — Ambulatory Visit: Payer: Medicare Other | Admitting: Internal Medicine

## 2021-07-26 ENCOUNTER — Encounter (HOSPITAL_BASED_OUTPATIENT_CLINIC_OR_DEPARTMENT_OTHER): Payer: Self-pay | Admitting: Cardiovascular Disease

## 2021-07-28 ENCOUNTER — Ambulatory Visit (INDEPENDENT_AMBULATORY_CARE_PROVIDER_SITE_OTHER): Payer: Medicare Other | Admitting: Internal Medicine

## 2021-07-28 ENCOUNTER — Encounter: Payer: Self-pay | Admitting: Internal Medicine

## 2021-07-28 DIAGNOSIS — G894 Chronic pain syndrome: Secondary | ICD-10-CM

## 2021-07-28 DIAGNOSIS — G4701 Insomnia due to medical condition: Secondary | ICD-10-CM

## 2021-07-28 DIAGNOSIS — G4700 Insomnia, unspecified: Secondary | ICD-10-CM | POA: Diagnosis not present

## 2021-07-28 DIAGNOSIS — M153 Secondary multiple arthritis: Secondary | ICD-10-CM

## 2021-07-28 DIAGNOSIS — G8929 Other chronic pain: Secondary | ICD-10-CM

## 2021-07-28 MED ORDER — TRAMADOL-ACETAMINOPHEN 37.5-325 MG PO TABS: 1.0000 | ORAL_TABLET | Freq: Two times a day (BID) | ORAL | 5 refills | Status: DC | PRN

## 2021-07-28 MED ORDER — ZOLPIDEM TARTRATE 5 MG PO TABS: 5.0000 mg | ORAL_TABLET | Freq: Every evening | ORAL | 1 refills | Status: DC | PRN

## 2021-07-28 MED ORDER — LORAZEPAM 1 MG PO TABS: 1.0000 mg | ORAL_TABLET | Freq: Every day | ORAL | 1 refills | Status: DC

## 2021-07-28 NOTE — Assessment & Plan Note (Signed)
Reviewed [place] database and no inappropriate fills. Refilled tramadol/apap 37.5/325 mg she uses #45 /month and has contract on file. Last UDS appropriate.

## 2021-07-28 NOTE — Assessment & Plan Note (Signed)
Refilled ambien 5 mg qhs and ativan 1 mg qhs. Counseled that concurrent use of ambien, ativan, and tramadol make her higher risk for accidental overdose. She is aware and is able to sleep much better on this regimen and accepts risk. Will continue and refilled both.

## 2021-07-28 NOTE — Progress Notes (Signed)
   Subjective:   Patient ID: Hailey [name], female    DOB: [birth year], 81 y.o.   MRN: [id number]  HPI The patient is an 81 YO female coming in for follow up back and neck pain as well as knee pain. She has been out of tramadol for some time unclear why pharmacy would not fill review of database indicates she should have had refills left.   Review of Systems  Constitutional:  Positive for activity change. Negative for appetite change, chills, fatigue, fever and unexpected weight change.  Respiratory: Negative.    Cardiovascular: Negative.   Gastrointestinal: Negative.   Musculoskeletal:  Positive for arthralgias, back pain and myalgias. Negative for gait problem and joint swelling.  Skin: Negative.   Neurological: Negative.     Objective:  Physical Exam Constitutional:      Appearance: She is well-developed.  HENT:     Head: Normocephalic and atraumatic.  Cardiovascular:     Rate and Rhythm: Normal rate and regular rhythm.  Pulmonary:     Effort: Pulmonary effort is normal. No respiratory distress.     Breath sounds: Normal breath sounds. No wheezing or rales.  Abdominal:     General: Bowel sounds are normal. There is no distension.     Palpations: Abdomen is soft.     Tenderness: There is no abdominal tenderness. There is no rebound.  Musculoskeletal:        General: Tenderness present.     Cervical back: Normal range of motion.  Skin:    General: Skin is warm and dry.  Neurological:     Mental Status: She is alert and oriented to person, place, and time.     Coordination: Coordination normal.     Vitals:   07/28/21 1552  BP: 110/68  Pulse: 87  Temp: 97.9 F (36.6 C)  TempSrc: Oral  SpO2: 92%  Weight: 128 lb (58.1 kg)  Height: '5\' 7"'$  (1.702 m)    Assessment & Plan:

## 2021-07-28 NOTE — Assessment & Plan Note (Signed)
She does have ongoing pain and uses tramadol with good relief. She is on pain contract and no inappropriate fills. Refilled tramadol for 6 month supply and follow up visit in 6 months.

## 2021-07-28 NOTE — Patient Instructions (Signed)
We have refilled the medicines.

## 2021-08-09 DIAGNOSIS — E559 Vitamin D deficiency, unspecified: Secondary | ICD-10-CM | POA: Diagnosis not present

## 2021-08-09 DIAGNOSIS — M810 Age-related osteoporosis without current pathological fracture: Secondary | ICD-10-CM | POA: Diagnosis not present

## 2021-10-03 DIAGNOSIS — M25561 Pain in right knee: Secondary | ICD-10-CM | POA: Diagnosis not present

## 2021-10-03 DIAGNOSIS — G8929 Other chronic pain: Secondary | ICD-10-CM | POA: Diagnosis not present

## 2021-10-03 DIAGNOSIS — M1712 Unilateral primary osteoarthritis, left knee: Secondary | ICD-10-CM | POA: Diagnosis not present

## 2021-10-27 ENCOUNTER — Other Ambulatory Visit: Payer: Self-pay | Admitting: Orthopedic Surgery

## 2021-10-27 DIAGNOSIS — Z01818 Encounter for other preprocedural examination: Secondary | ICD-10-CM | POA: Diagnosis not present

## 2021-10-27 DIAGNOSIS — M1712 Unilateral primary osteoarthritis, left knee: Secondary | ICD-10-CM | POA: Diagnosis not present

## 2021-10-28 LAB — CBC
HCT: 40.1 % (ref 35.0–45.0)
Hemoglobin: 13.6 g/dL (ref 11.7–15.5)
MCH: 30.6 pg (ref 27.0–33.0)
MCHC: 33.9 g/dL (ref 32.0–36.0)
MCV: 90.3 fL (ref 80.0–100.0)
MPV: 11.4 fL (ref 7.5–12.5)
Platelets: 202 10*3/uL (ref 140–400)
RBC: 4.44 10*6/uL (ref 3.80–5.10)
RDW: 12.5 % (ref 11.0–15.0)
WBC: 5.8 10*3/uL (ref 3.8–10.8)

## 2021-10-28 LAB — COMPLETE METABOLIC PANEL WITH GFR
AG Ratio: 2.1 (calc) (ref 1.0–2.5)
ALT: 11 U/L (ref 6–29)
AST: 19 U/L (ref 10–35)
Albumin: 4.5 g/dL (ref 3.6–5.1)
Alkaline phosphatase (APISO): 38 U/L (ref 37–153)
BUN: 23 mg/dL (ref 7–25)
CO2: 26 mmol/L (ref 20–32)
Calcium: 9.5 mg/dL (ref 8.6–10.4)
Chloride: 102 mmol/L (ref 98–110)
Creat: 0.7 mg/dL (ref 0.60–0.95)
Globulin: 2.1 g/dL (calc) (ref 1.9–3.7)
Glucose, Bld: 129 mg/dL — ABNORMAL HIGH (ref 65–99)
Potassium: 4.1 mmol/L (ref 3.5–5.3)
Sodium: 137 mmol/L (ref 135–146)
Total Bilirubin: 0.4 mg/dL (ref 0.2–1.2)
Total Protein: 6.6 g/dL (ref 6.1–8.1)
eGFR: 87 mL/min/{1.73_m2} (ref >=60)

## 2021-11-06 DIAGNOSIS — G8918 Other acute postprocedural pain: Secondary | ICD-10-CM | POA: Diagnosis not present

## 2021-11-06 DIAGNOSIS — M1712 Unilateral primary osteoarthritis, left knee: Secondary | ICD-10-CM | POA: Diagnosis not present

## 2021-11-14 ENCOUNTER — Other Ambulatory Visit: Payer: Self-pay | Admitting: Internal Medicine

## 2021-11-16 DIAGNOSIS — Z471 Aftercare following joint replacement surgery: Secondary | ICD-10-CM | POA: Diagnosis not present

## 2021-11-16 DIAGNOSIS — M25462 Effusion, left knee: Secondary | ICD-10-CM | POA: Diagnosis not present

## 2021-11-16 DIAGNOSIS — M25662 Stiffness of left knee, not elsewhere classified: Secondary | ICD-10-CM | POA: Diagnosis not present

## 2021-11-16 DIAGNOSIS — Z96652 Presence of left artificial knee joint: Secondary | ICD-10-CM | POA: Diagnosis not present

## 2021-11-16 DIAGNOSIS — Z7409 Other reduced mobility: Secondary | ICD-10-CM | POA: Diagnosis not present

## 2021-11-16 DIAGNOSIS — M25562 Pain in left knee: Secondary | ICD-10-CM | POA: Diagnosis not present

## 2021-11-16 DIAGNOSIS — R29898 Other symptoms and signs involving the musculoskeletal system: Secondary | ICD-10-CM | POA: Diagnosis not present

## 2021-11-16 MED ORDER — ESCITALOPRAM OXALATE 20 MG PO TABS: 20.0000 mg | ORAL_TABLET | Freq: Every day | ORAL | 3 refills | Status: DC

## 2021-11-16 NOTE — Addendum Note (Signed)
Addended by: Rossie Muskrat on: 11/16/2021 09:47 AM   Modules accepted: Orders

## 2021-11-17 DIAGNOSIS — R42 Dizziness and giddiness: Secondary | ICD-10-CM | POA: Diagnosis not present

## 2021-11-17 DIAGNOSIS — R55 Syncope and collapse: Secondary | ICD-10-CM | POA: Diagnosis not present

## 2021-11-23 DIAGNOSIS — Z96652 Presence of left artificial knee joint: Secondary | ICD-10-CM | POA: Diagnosis not present

## 2021-11-23 DIAGNOSIS — Z7409 Other reduced mobility: Secondary | ICD-10-CM | POA: Diagnosis not present

## 2021-11-23 DIAGNOSIS — M25462 Effusion, left knee: Secondary | ICD-10-CM | POA: Diagnosis not present

## 2021-11-23 DIAGNOSIS — R29898 Other symptoms and signs involving the musculoskeletal system: Secondary | ICD-10-CM | POA: Diagnosis not present

## 2021-11-23 DIAGNOSIS — M25662 Stiffness of left knee, not elsewhere classified: Secondary | ICD-10-CM | POA: Diagnosis not present

## 2021-11-23 DIAGNOSIS — M25562 Pain in left knee: Secondary | ICD-10-CM | POA: Diagnosis not present

## 2021-12-04 DIAGNOSIS — M25562 Pain in left knee: Secondary | ICD-10-CM | POA: Diagnosis not present

## 2021-12-04 DIAGNOSIS — M25662 Stiffness of left knee, not elsewhere classified: Secondary | ICD-10-CM | POA: Diagnosis not present

## 2021-12-04 DIAGNOSIS — Z96652 Presence of left artificial knee joint: Secondary | ICD-10-CM | POA: Diagnosis not present

## 2021-12-04 DIAGNOSIS — Z7409 Other reduced mobility: Secondary | ICD-10-CM | POA: Diagnosis not present

## 2021-12-04 DIAGNOSIS — R29898 Other symptoms and signs involving the musculoskeletal system: Secondary | ICD-10-CM | POA: Diagnosis not present

## 2021-12-04 DIAGNOSIS — M25462 Effusion, left knee: Secondary | ICD-10-CM | POA: Diagnosis not present

## 2022-01-29 ENCOUNTER — Ambulatory Visit (INDEPENDENT_AMBULATORY_CARE_PROVIDER_SITE_OTHER): Payer: Medicare Other | Admitting: Internal Medicine

## 2022-01-29 ENCOUNTER — Encounter: Payer: Self-pay | Admitting: Internal Medicine

## 2022-01-29 VITALS — BP 120/80 | HR 78 | Temp 97.9°F | Ht 67.0 in | Wt 133.0 lb

## 2022-01-29 DIAGNOSIS — I5032 Chronic diastolic (congestive) heart failure: Secondary | ICD-10-CM

## 2022-01-29 DIAGNOSIS — G894 Chronic pain syndrome: Secondary | ICD-10-CM

## 2022-01-29 DIAGNOSIS — G4700 Insomnia, unspecified: Secondary | ICD-10-CM

## 2022-01-29 DIAGNOSIS — G8929 Other chronic pain: Secondary | ICD-10-CM

## 2022-01-29 DIAGNOSIS — F3341 Major depressive disorder, recurrent, in partial remission: Secondary | ICD-10-CM | POA: Diagnosis not present

## 2022-01-29 DIAGNOSIS — G4701 Insomnia due to medical condition: Secondary | ICD-10-CM | POA: Diagnosis not present

## 2022-01-29 DIAGNOSIS — Z23 Encounter for immunization: Secondary | ICD-10-CM | POA: Diagnosis not present

## 2022-01-29 DIAGNOSIS — M153 Secondary multiple arthritis: Secondary | ICD-10-CM | POA: Diagnosis not present

## 2022-01-29 MED ORDER — LORAZEPAM 1 MG PO TABS: 1.0000 mg | ORAL_TABLET | Freq: Every day | ORAL | 1 refills | Status: DC

## 2022-01-29 MED ORDER — ZOLPIDEM TARTRATE 5 MG PO TABS: 5.0000 mg | ORAL_TABLET | Freq: Every evening | ORAL | 1 refills | Status: DC | PRN

## 2022-01-29 MED ORDER — TRAMADOL-ACETAMINOPHEN 37.5-325 MG PO TABS: 1.0000 | ORAL_TABLET | Freq: Two times a day (BID) | ORAL | 5 refills | Status: DC | PRN

## 2022-01-29 NOTE — Progress Notes (Unsigned)
   Subjective:   Patient ID: Hailey [name], female    DOB: Nov 07, 1940, 81 y.o.   MRN: [id number]  HPI The patient is a 81 YO female coming in for follow up.   Review of Systems  Constitutional:  Positive for activity change. Negative for appetite change, chills, fatigue, fever and unexpected weight change.  Respiratory: Negative.    Cardiovascular: Negative.   Gastrointestinal: Negative.   Musculoskeletal:  Positive for arthralgias, back pain and myalgias. Negative for gait problem and joint swelling.  Skin: Negative.   Neurological: Negative.     Objective:  Physical Exam Constitutional:      Appearance: She is well-developed. She is ill-appearing.  HENT:     Head: Normocephalic and atraumatic.  Cardiovascular:     Rate and Rhythm: Normal rate and regular rhythm.  Pulmonary:     Effort: Pulmonary effort is normal. No respiratory distress.     Breath sounds: Normal breath sounds. No wheezing or rales.  Abdominal:     General: Bowel sounds are normal. There is no distension.     Palpations: Abdomen is soft.     Tenderness: There is no abdominal tenderness. There is no rebound.  Musculoskeletal:        General: Tenderness present.     Cervical back: Normal range of motion.  Skin:    General: Skin is warm and dry.  Neurological:     Mental Status: She is alert and oriented to person, place, and time.     Coordination: Coordination abnormal.     Vitals:   01/29/22 1557  BP: 120/80  Pulse: 78  Temp: 97.9 F (36.6 C)  TempSrc: Oral  SpO2: 95%  Weight: 133 lb (60.3 kg)  Height: '5\' 7"'$  (1.702 m)    Assessment & Plan:

## 2022-01-29 NOTE — Patient Instructions (Signed)
We have refilled the medicines and given you the flu shot.  Wait on the RSV until summer

## 2022-01-30 ENCOUNTER — Encounter: Payer: Self-pay | Admitting: Internal Medicine

## 2022-01-30 NOTE — Assessment & Plan Note (Signed)
She has had additional narcotics from additional provider since our last visit without notifying us. Will accept as with left knee replacement. She will continue tramadol/apap 50/325 #45 per month which was refilled today. Counseled on benefit and risk.

## 2022-01-30 NOTE — Assessment & Plan Note (Signed)
Using lexapro 20 mg daily and lorazepam 1 mg qhs prn. She is adequately controlled at this time and will keep regimen the same.

## 2022-01-30 NOTE — Assessment & Plan Note (Signed)
She has had ongoing pain since last visit and manages with #45 tramadol/apap monthly. She has had left knee replacement since last visit.

## 2022-01-30 NOTE — Assessment & Plan Note (Addendum)
Still  using ambien 5 mg qhs and refilled today. She is high risk for side effects given concurrent ambien, lorazepam, tramadol. She understands risk and wishes to proceed.

## 2022-01-30 NOTE — Assessment & Plan Note (Signed)
No flare today. This is likely not clinically significant as she has not had any flares. This was detected on echo only.

## 2022-02-10 ENCOUNTER — Other Ambulatory Visit: Payer: Self-pay | Admitting: Internal Medicine

## 2022-02-21 DIAGNOSIS — M810 Age-related osteoporosis without current pathological fracture: Secondary | ICD-10-CM | POA: Diagnosis not present

## 2022-02-21 DIAGNOSIS — E559 Vitamin D deficiency, unspecified: Secondary | ICD-10-CM | POA: Diagnosis not present

## 2022-03-01 ENCOUNTER — Telehealth: Payer: Self-pay | Admitting: Internal Medicine

## 2022-03-01 NOTE — Telephone Encounter (Signed)
Tequila from [employer] called to ask for status of paperwork request for neurological screening. States this was faxed on 02/07/22 and again on 1/11. She needs this faxed back with an approval or denial by January 22nd. Call back number is [phone] and paperwork needs to be faxed to [phone].

## 2022-03-01 NOTE — Telephone Encounter (Signed)
Spoke to a Ms Lacie Scotts and she stated that she will be re-faxing over the form to me for the provider to sign

## 2022-03-12 NOTE — Telephone Encounter (Signed)
Tequila called back and states she will refax the form, would like a callback once form has been received at [phone].

## 2022-03-21 NOTE — Telephone Encounter (Signed)
Have not received proper forms for patient mediation to be signed by the provider.

## 2022-05-09 ENCOUNTER — Telehealth: Payer: Self-pay

## 2022-05-09 NOTE — Telephone Encounter (Signed)
Contacted [name] to schedule their annual wellness visit. Appointment made for 05/17/22.  Norton Blizzard, [place] ([facility])  [facility] [phone]

## 2022-05-17 ENCOUNTER — Ambulatory Visit (INDEPENDENT_AMBULATORY_CARE_PROVIDER_SITE_OTHER): Payer: Medicare Other

## 2022-05-17 VITALS — Ht 67.0 in | Wt 128.0 lb

## 2022-05-17 DIAGNOSIS — Z0000 Encounter for general adult medical examination without abnormal findings: Secondary | ICD-10-CM

## 2022-05-17 NOTE — Patient Instructions (Signed)
[name] , Thank you for taking time to come for your Medicare Wellness Visit. I appreciate your ongoing commitment to your health goals. Please review the following plan we discussed and let me know if I can assist you in the future.   These are the goals we discussed:  Goals      Client understands the importance of follow-up with providers by attending scheduled visits.     Increase my exercise routine and eat healthier.        This is a list of the screening recommended for you and due dates:  Health Maintenance  Topic Date Due   COVID-19 Vaccine (3 - 2023-24 season) 10/13/2021   DEXA scan (bone density measurement)  12/25/2021   Flu Shot  09/13/2022   Medicare Annual Wellness Visit  05/17/2023   DTaP/Tdap/Td vaccine (3 - Td or Tdap) 12/15/2024   Pneumonia Vaccine  Completed   Zoster (Shingles) Vaccine  Completed   HPV Vaccine  Aged Out    Advanced directives: No  Conditions/risks identified: Yes  Next appointment: Follow up in one year for your annual wellness visit.   Preventive Care 57 Years and Older, Female Preventive care refers to lifestyle choices and visits with your health care provider that can promote health and wellness. What does preventive care include? A yearly physical exam. This is also called an annual well check. Dental exams once or twice a year. Routine eye exams. Ask your health care provider how often you should have your eyes checked. Personal lifestyle choices, including: Daily care of your teeth and gums. Regular physical activity. Eating a healthy diet. Avoiding tobacco and drug use. Limiting alcohol use. Practicing safe sex. Taking low-dose aspirin every day. Taking vitamin and mineral supplements as recommended by your health care provider. What happens during an annual well check? The services and screenings done by your health care provider during your annual well check will depend on your age, overall health, lifestyle risk  factors, and family history of disease. Counseling  Your health care provider may ask you questions about your: Alcohol use. Tobacco use. Drug use. Emotional well-being. Home and relationship well-being. Sexual activity. Eating habits. History of falls. Memory and ability to understand (cognition). Work and work Statistician. Reproductive health. Screening  You may have the following tests or measurements: Height, weight, and BMI. Blood pressure. Lipid and cholesterol levels. These may be checked every 5 years, or more frequently if you are over 70 years old. Skin check. Lung cancer screening. You may have this screening every year starting at age 82 if you have a 30-pack-year history of smoking and currently smoke or have quit within the past 15 years. Fecal occult blood test (FOBT) of the stool. You may have this test every year starting at age 82. Flexible sigmoidoscopy or colonoscopy. You may have a sigmoidoscopy every 5 years or a colonoscopy every 10 years starting at age 82. Hepatitis C blood test. Hepatitis B blood test. Sexually transmitted disease (STD) testing. Diabetes screening. This is done by checking your blood sugar (glucose) after you have not eaten for a while (fasting). You may have this done every 1-3 years. Bone density scan. This is done to screen for osteoporosis. You may have this done starting at age 82. Mammogram. This may be done every 1-2 years. Talk to your health care provider about how often you should have regular mammograms. Talk with your health care provider about your test results, treatment options, and if necessary, the need  for more tests. Vaccines  Your health care provider may recommend certain vaccines, such as: Influenza vaccine. This is recommended every year. Tetanus, diphtheria, and acellular pertussis (Tdap, Td) vaccine. You may need a Td booster every 10 years. Zoster vaccine. You may need this after age 75. Pneumococcal 13-valent  conjugate (PCV13) vaccine. One dose is recommended after age 82. Pneumococcal polysaccharide (PPSV23) vaccine. One dose is recommended after age 82. Talk to your health care provider about which screenings and vaccines you need and how often you need them. This information is not intended to replace advice given to you by your health care provider. Make sure you discuss any questions you have with your health care provider. Document Released: 02/25/2015 Document Revised: 10/19/2015 Document Reviewed: 11/30/2014 Elsevier Interactive Patient Education  2017 [place] Prevention in the Home Falls can cause injuries. They can happen to people of all ages. There are many things you can do to make your home safe and to help prevent falls. What can I do on the outside of my home? Regularly fix the edges of walkways and driveways and fix any cracks. Remove anything that might make you trip as you walk through a door, such as a raised step or threshold. Trim any bushes or trees on the path to your home. Use bright outdoor lighting. Clear any walking paths of anything that might make someone trip, such as rocks or tools. Regularly check to see if handrails are loose or broken. Make sure that both sides of any steps have handrails. Any raised decks and porches should have guardrails on the edges. Have any leaves, [place], or ice cleared regularly. Use sand or salt on walking paths during winter. Clean up any spills in your garage right away. This includes oil or grease spills. What can I do in the bathroom? Use night lights. Install grab bars by the toilet and in the tub and shower. Do not use towel bars as grab bars. Use non-skid mats or decals in the tub or shower. If you need to sit down in the shower, use a plastic, non-slip stool. Keep the floor dry. Clean up any water that spills on the floor as soon as it happens. Remove soap buildup in the tub or shower regularly. Attach bath mats  securely with double-sided non-slip rug tape. Do not have throw rugs and other things on the floor that can make you trip. What can I do in the bedroom? Use night lights. Make sure that you have a light by your bed that is easy to reach. Do not use any sheets or blankets that are too big for your bed. They should not hang down onto the floor. Have a firm chair that has side arms. You can use this for support while you get dressed. Do not have throw rugs and other things on the floor that can make you trip. What can I do in the kitchen? Clean up any spills right away. Avoid walking on wet floors. Keep items that you use a lot in easy-to-reach places. If you need to reach something above you, use a strong step stool that has a grab bar. Keep electrical cords out of the way. Do not use floor polish or wax that makes floors slippery. If you must use wax, use non-skid floor wax. Do not have throw rugs and other things on the floor that can make you trip. What can I do with my stairs? Do not leave any items on the stairs.  Make sure that there are handrails on both sides of the stairs and use them. Fix handrails that are broken or loose. Make sure that handrails are as long as the stairways. Check any carpeting to make sure that it is firmly attached to the stairs. Fix any carpet that is loose or worn. Avoid having throw rugs at the top or bottom of the stairs. If you do have throw rugs, attach them to the floor with carpet tape. Make sure that you have a light switch at the top of the stairs and the bottom of the stairs. If you do not have them, ask someone to add them for you. What else can I do to help prevent falls? Wear shoes that: Do not have high heels. Have rubber bottoms. Are comfortable and fit you well. Are closed at the toe. Do not wear sandals. If you use a stepladder: Make sure that it is fully opened. Do not climb a closed stepladder. Make sure that both sides of the stepladder  are locked into place. Ask someone to hold it for you, if possible. Clearly mark and make sure that you can see: Any grab bars or handrails. First and last steps. Where the edge of each step is. Use tools that help you move around (mobility aids) if they are needed. These include: Canes. Walkers. Scooters. Crutches. Turn on the lights when you go into a dark area. Replace any light bulbs as soon as they burn out. Set up your furniture so you have a clear path. Avoid moving your furniture around. If any of your floors are uneven, fix them. If there are any pets around you, be aware of where they are. Review your medicines with your doctor. Some medicines can make you feel dizzy. This can increase your chance of falling. Ask your doctor what other things that you can do to help prevent falls. This information is not intended to replace advice given to you by your health care provider. Make sure you discuss any questions you have with your health care provider. Document Released: 11/25/2008 Document Revised: 07/07/2015 Document Reviewed: 03/05/2014 Elsevier Interactive Patient Education  2017 [employer].

## 2022-05-17 NOTE — Progress Notes (Signed)
I connected with  [name] on 05/17/22 by a audio enabled telemedicine application and verified that I am speaking with the correct person using two identifiers.  Patient Location: Home  Provider Location: Office/Clinic  I discussed the limitations of evaluation and management by telemedicine. The patient expressed understanding and agreed to proceed.  Subjective:   Hailey [name] is a 82 y.o. female who presents for Medicare Annual (Subsequent) preventive examination.  Review of Systems     Cardiac Risk Factors include: advanced age (>70men, >16 women);dyslipidemia;family history of premature cardiovascular disease;hypertension     Objective:    Today's Vitals   05/17/22 1504 05/17/22 1505  Weight: 128 lb (58.1 kg)   Height: 5\' 7"  (1.702 m)   PainSc: 5  5   PainLoc: Generalized    Body mass index is 20.05 kg/m.     05/17/2022    3:06 PM 05/15/2021    2:58 PM 11/19/2020    5:00 PM 11/18/2020   12:27 PM 10/18/2020    1:57 PM 10/05/2020    9:40 AM 12/10/2017   11:56 AM  Advanced Directives  Does Patient Have a Medical Advance Directive? Yes Yes No No Yes Yes Yes  Type of Paramedic of [place];Living will Living will;Healthcare Power of Attorney   Living will;Healthcare Power of Attorney Living will;Healthcare Power of Attorney Living will  Does patient want to make changes to medical advance directive?  No - Patient declined   No - Patient declined  No - Patient declined  Copy of [place] in Chart? No - copy requested No - copy requested       Would patient like information on creating a medical advance directive?   No - Patient declined        Current Medications (verified) Outpatient Encounter Medications as of 05/17/2022  Medication Sig   acetaminophen (TYLENOL) 500 MG tablet Take 2 tablets (1,000 mg total) by mouth every 8 (eight) hours as needed for mild pain or moderate pain.   aspirin EC 81 MG tablet Take 1 tablet (81 mg  total) by mouth 2 (two) times daily. For DVT prophylaxis for 30 days after surgery.   atorvastatin (LIPITOR) 40 MG tablet Take 1 tablet (40 mg total) by mouth daily.   denosumab (PROLIA) 60 MG/ML SOLN injection Inject 60 mg into the skin every 6 (six) months. Administer in upper arm, thigh, or abdomen   diclofenac sodium (VOLTAREN) 1 % GEL Apply 2 g topically at bedtime.   escitalopram (LEXAPRO) 20 MG tablet Take 1 tablet (20 mg total) by mouth daily.   fluticasone (FLONASE) 50 MCG/ACT nasal spray Use 1 spray each nostril 1-2 times daily as needed (Patient taking differently: Place 1 spray into both nostrils 2 (two) times daily.)   gabapentin (NEURONTIN) 300 MG capsule Take 1 capsule (300 mg total) by mouth daily.   loperamide (IMODIUM) 2 MG capsule Take 2 mg by mouth as needed for diarrhea or loose stools.   LORazepam (ATIVAN) 1 MG tablet Take 1 tablet (1 mg total) by mouth at bedtime.   meloxicam (MOBIC) 15 MG tablet Take 1 tablet (15 mg total) by mouth daily as needed for pain (and inflammation).   methocarbamol (ROBAXIN) 500 MG tablet Take 1 tablet (500 mg total) by mouth every 8 (eight) hours as needed for muscle spasms.   ondansetron (ZOFRAN ODT) 4 MG disintegrating tablet Take 1 tablet (4 mg total) by mouth 2 (two) times daily as needed for  nausea or vomiting.   traMADol-acetaminophen (ULTRACET) 37.5-325 MG tablet Take 1 tablet by mouth 2 (two) times daily as needed.   zolpidem (AMBIEN) 5 MG tablet Take 1 tablet (5 mg total) by mouth at bedtime as needed.   No facility-administered encounter medications on file as of 05/17/2022.    Allergies (verified) Resorcinol   History: Past Medical History:  Diagnosis Date   Allergic rhinitis, cause unspecified    Carotid artery disease 12/13/2014   1-39% stenosis of bilateral CCAs.   Repeat ultrasound 11/2016.   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   OSA (obstructive sleep apnea)    Osteoarthrosis, unspecified whether generalized or localized, unspecified site    Other and unspecified hyperlipidemia    Personal history of colonic adenomas 11/28/2012   Personal history of other disorders of nervous system and sense organs    SAH (subarachnoid hemorrhage) 02/02/2014   Unspecified closed fracture of pelvis    x 2 2004/2006   Unspecified essential hypertension    Past Surgical History:  Procedure Laterality Date   ANKLE FUSION Left    BACK SURGERY     ; has  had 8 back surgeries   CERVICAL FUSION     C4-7   COLONOSCOPY     CYSTOSCOPY     HERNIA REPAIR     umbilical   LUMBAR FUSION     L2-3   REVERSE SHOULDER ARTHROPLASTY Left 12/10/2017   Procedure: LEFT REVERSE SHOULDER ARTHROPLASTY;  Surgeon: Marchia Bond, MD;  Location: [facility];  Service: Orthopedics;  Laterality: Left;  Interscalene Block   SACRAL FUSION  07/2012   TIBIA FRACTURE SURGERY Right    TONSILLECTOMY     TOTAL HIP ARTHROPLASTY Right 2012   TOTAL HIP ARTHROPLASTY Left 10/18/2020   Procedure: TOTAL HIP ARTHROPLASTY ANTERIOR APPROACH;  Surgeon: Renette Butters, MD;  Location: [facility];  Service: Orthopedics;  Laterality: Left;   TUBAL LIGATION     UPPER GASTROINTESTINAL ENDOSCOPY     Family History  Problem Relation Age of Onset   Dementia Mother    Hypertension Father    Heart attack Father    Allergic rhinitis Father    Cancer Maternal Aunt        breast cancer   Alzheimer's disease Maternal Aunt    Stroke Paternal Uncle    Cancer Cousin        breast cancer   Colon cancer Neg Hx    Angioedema Neg Hx    Asthma Neg Hx    Eczema Neg Hx    Social History   Socioeconomic History   Marital status: Widowed    Spouse name: Not on file   Number of children:  Not on file   Years of education: Not on file   Highest education level: Not on file  Occupational History   Occupation: OWNER    Employer: [employer]    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  Tobacco Use   Smoking status: Former    Packs/day: 2.00    Years: 10.00    Additional pack years: 0.00    Total pack years: 20.00    Types: Cigarettes    Quit  date: 02/13/1967    Years since quitting: 55.2   Smokeless tobacco: Never   Tobacco comments:    smoked 1961-1969 , up to 3 ppd, mainly 2 ppd  Vaping Use   Vaping Use: Never used  Substance and Sexual Activity   Alcohol use: No   Drug use: No   Sexual activity: Never  Other Topics Concern   Not on file  Social History Narrative   Not on file   Social Determinants of Health   Financial Resource Strain: Low Risk  (05/17/2022)   Overall Financial Resource Strain (CARDIA)    Difficulty of Paying Living Expenses: Not hard at all  Food Insecurity: No Food Insecurity (05/17/2022)   Hunger Vital Sign    Worried About Running Out of Food in the Last Year: Never true    Ran Out of Food in the Last Year: Never true  Transportation Needs: No Transportation Needs (05/17/2022)   PRAPARE - Hydrologist (Medical): No    Lack of Transportation (Non-Medical): No  Physical Activity: Inactive (05/17/2022)   Exercise Vital Sign    Days of Exercise per Week: 0 days    Minutes of Exercise per Session: 0 min  Stress: No Stress Concern Present (05/17/2022)   [place]    Feeling of Stress : Not at all  Social Connections: Unknown (05/17/2022)   Social Connection and Isolation Panel [NHANES]    Frequency of Communication with Friends and Family: More than three times a week    Frequency of Social Gatherings with Friends and Family: More than three times a week    Attends Religious Services: Patient declined    Marine scientist or Organizations: Patient declined     Attends Archivist Meetings: Patient declined    Marital Status: Widowed    Tobacco Counseling Counseling given: Not Answered Tobacco comments: smoked 1961-1969 , up to 3 ppd, mainly 2 ppd   Clinical Intake:  Pre-visit preparation completed: Yes  Pain : 0-10 Pain Score: 5  Pain Type: Chronic pain Pain Location: Generalized     BMI - recorded: 20.05 Nutritional Status: BMI of 19-24  Normal Nutritional Risks: None Diabetes: No  How often do you need to have someone help you when you read instructions, pamphlets, or other written materials from your doctor or pharmacy?: 1 - Never What is the last grade level you completed in school?: HSG  Diabetic? No  Interpreter Needed?: No  Information entered by :: Lisette Abu, LPN.   Activities of Daily Living    05/17/2022    3:15 PM  In your present state of health, do you have any difficulty performing the following activities:  Hearing? 0  Vision? 0  Difficulty concentrating or making decisions? 0  Walking or climbing stairs? 1  Dressing or bathing? 0  Doing errands, shopping? 0  Preparing Food and eating ? N  Using the Toilet? N  In the past six months, have you accidently leaked urine? N  Do you have problems with loss of bowel control? N  Managing your Medications? N  Managing your Finances? N  Housekeeping or managing your Housekeeping? Y    Patient Care Team: Hoyt Koch, MD as PCP - General (Internal Medicine) Skeet Latch, MD as PCP - Cardiology (Cardiology) Deneise Lever, MD as Consulting Physician (Pulmonary Disease) Neldon Mc Donnamarie Poag, MD as Consulting Physician (Allergy and Immunology) Skeet Latch, MD as Attending Physician (  Cardiology) Jenne Campus, MD as Referring Physician (Neurosurgery) Iven Finn, DMD (Dentistry) [facility] as Consulting Physician (Ophthalmology)  Indicate any recent Medical Services you may have received from other than  [facility] providers in the past year (date may be approximate).     Assessment:   This is a routine wellness examination for [place].  Hearing/Vision screen Hearing Screening - Comments:: Denies hearing difficulties   Vision Screening - Comments:: Wears rx glasses - up to date with routine eye exams with [facility]   Dietary issues and exercise activities discussed: Current Exercise Habits: The patient does not participate in regular exercise at present, Exercise limited by: neurologic condition(s);cardiac condition(s);orthopedic condition(s);psychological condition(s)   Goals Addressed             This Visit's Progress    Client understands the importance of follow-up with providers by attending scheduled visits.       Increase my exercise routine and eat healthier.      Depression Screen    05/17/2022    3:08 PM 01/29/2022    4:01 PM 01/29/2022    3:56 PM 05/15/2021    3:02 PM 09/06/2020    3:28 PM 04/17/2019    2:03 PM 03/28/2018    3:18 PM  PHQ 2/9 Scores  PHQ - 2 Score 0 0 0 0 0 0 0  PHQ- 9 Score 0 0 0        Fall Risk    05/17/2022    3:08 PM 01/29/2022    4:01 PM 01/29/2022    3:56 PM 05/15/2021    2:59 PM 09/06/2020    3:29 PM  [place] in the past year? 1 1 0 1 0  Number falls in past yr: 1 0 0 0 0  Injury with Fall? 1 0 0 1 0  Risk for fall due to : History of fall(s);Impaired balance/gait;Orthopedic patient      Follow up Education provided;Falls prevention discussed Falls evaluation completed Falls evaluation completed      FALL RISK PREVENTION PERTAINING TO THE HOME:  Any stairs in or around the home? No  If so, are there any without handrails? No  Home free of loose throw rugs in walkways, pet beds, electrical cords, etc? Yes  Adequate lighting in your home to reduce risk of falls? Yes   ASSISTIVE DEVICES UTILIZED TO PREVENT FALLS:  Life alert? No  Use of a cane, walker or w/c? Yes  Grab bars in the bathroom? Yes  Shower chair or bench in  shower? Yes  Elevated toilet seat or a handicapped toilet? Yes   TIMED UP AND GO:  Was the test performed? No . Telephonic Visit   Cognitive Function:    01/28/2017    4:44 PM  MMSE - Mini Mental State Exam  Orientation to time 5  Orientation to Place 5  Registration 3  Attention/ Calculation 5  Recall 1  Language- name 2 objects 2  Language- repeat 1  Language- follow 3 step command 3  Language- read & follow direction 1  Write a sentence 1  Copy design 1  Total score 28        05/17/2022    3:12 PM 05/15/2021    3:15 PM  6CIT Screen  What Year? 0 points 0 points  What month? 0 points 0 points  What time? 0 points 0 points  Count back from 20 0 points 0 points  Months in reverse 0 points  0 points  Repeat phrase 0 points 0 points  Total Score 0 points 0 points    Immunizations Immunization History  Administered Date(s) Administered   Fluad Quad(high Dose 65+) 11/28/2018, 11/23/2020, 01/29/2022   Influenza Split 10/29/2011, 10/13/2016, 11/09/2017   Influenza Whole 11/26/2006, 11/14/2007, 11/12/2009   Influenza, High Dose Seasonal PF 11/19/2012   Influenza,inj,Quad PF,6+ Mos 11/26/2014, 12/16/2014, 11/07/2015, 10/13/2019   Influenza-Unspecified 10/13/2017   Moderna Sars-Covid-2 Vaccination 02/23/2019, 03/23/2019   Pneumococcal Conjugate-13 11/07/2014   Pneumococcal Polysaccharide-23 01/18/2012   Td 02/25/2004   Tdap 12/16/2014   Zoster Recombinat (Shingrix) 05/15/2017, 08/21/2017   Zoster, Live 10/18/2005    TDAP status: Up to date  Flu Vaccine status: Up to date  Pneumococcal vaccine status: Up to date  Covid-19 vaccine status: Completed vaccines  Qualifies for Shingles Vaccine? Yes   Zostavax completed Yes   Shingrix Completed?: Yes  Screening Tests Health Maintenance  Topic Date Due   COVID-19 Vaccine (3 - 2023-24 season) 10/13/2021   DEXA SCAN  12/25/2021   INFLUENZA VACCINE  09/13/2022   Medicare Annual Wellness (AWV)  05/17/2023    DTaP/Tdap/Td (3 - Td or Tdap) 12/15/2024   Pneumonia Vaccine 40+ Years old  Completed   Zoster Vaccines- Shingrix  Completed   HPV VACCINES  Aged Out    Health Maintenance  Health Maintenance Due  Topic Date Due   COVID-19 Vaccine (3 - 2023-24 season) 10/13/2021   DEXA SCAN  12/25/2021    Colorectal cancer screening: No longer required.   Mammogram status: No longer required due to patient denial.  Bone Density status: Completed 12/26/2019. Results reflect: Bone density results: NORMAL. Repeat every 2-5 years.  Lung Cancer Screening: (Low Dose CT Chest recommended if Age 59-80 years, 30 pack-year currently smoking OR have quit w/in 15years.) does not qualify.   Lung Cancer Screening Referral: no  Additional Screening:  Hepatitis C Screening: does not qualify; Completed: no  Vision Screening: Recommended annual ophthalmology exams for early detection of glaucoma and other disorders of the eye. Is the patient up to date with their annual eye exam?  Yes  Who is the provider or what is the name of the office in which the patient attends annual eye exams? [facility] If pt is not established with a provider, would they like to be referred to a provider to establish care? No .   Dental Screening: Recommended annual dental exams for proper oral hygiene  Community Resource Referral / Chronic Care Management: CRR required this visit?  No   CCM required this visit?  No      Plan:     I have personally reviewed and noted the following in the patient's chart:   Medical and social history Use of alcohol, tobacco or illicit drugs  Current medications and supplements including opioid prescriptions. Patient is not currently taking opioid prescriptions. Functional ability and status Nutritional status Physical activity Advanced directives List of other physicians Hospitalizations, surgeries, and ER visits in previous 12 months Vitals Screenings to include cognitive,  depression, and falls Referrals and appointments  In addition, I have reviewed and discussed with patient certain preventive protocols, quality metrics, and best practice recommendations. A written personalized care plan for preventive services as well as general preventive health recommendations were provided to patient.     Sheral Flow, LPN   [id number]   Nurse Notes:  Normal cognitive status assessed by direct observation by this Nurse Health Advisor by phone. No abnormalities found.

## 2022-06-26 DIAGNOSIS — H2513 Age-related nuclear cataract, bilateral: Secondary | ICD-10-CM | POA: Diagnosis not present

## 2022-06-26 DIAGNOSIS — H353131 Nonexudative age-related macular degeneration, bilateral, early dry stage: Secondary | ICD-10-CM | POA: Diagnosis not present

## 2022-06-26 DIAGNOSIS — H04123 Dry eye syndrome of bilateral lacrimal glands: Secondary | ICD-10-CM | POA: Diagnosis not present

## 2022-08-15 DIAGNOSIS — M810 Age-related osteoporosis without current pathological fracture: Secondary | ICD-10-CM | POA: Diagnosis not present

## 2022-08-15 DIAGNOSIS — R5383 Other fatigue: Secondary | ICD-10-CM | POA: Diagnosis not present

## 2022-08-15 DIAGNOSIS — E559 Vitamin D deficiency, unspecified: Secondary | ICD-10-CM | POA: Diagnosis not present

## 2022-08-24 ENCOUNTER — Other Ambulatory Visit: Payer: Self-pay

## 2022-08-24 ENCOUNTER — Telehealth: Payer: Self-pay | Admitting: Pharmacy Technician

## 2022-08-24 DIAGNOSIS — M810 Age-related osteoporosis without current pathological fracture: Secondary | ICD-10-CM

## 2022-08-24 NOTE — Telephone Encounter (Signed)
Auth Submission: NO AUTH NEEDED Site of care: Site of care: [facility] Payer: MEDICARE A/B & AARP Medication & CPT/J Code(s) submitted: Prolia (Denosumab) [phone] Route of submission (phone, fax, portal):  Phone # Fax # Auth type: Buy/Bill Units/visits requested: 1 Reference number:  Approval from: 08/23/22 to 02/12/23

## 2022-08-29 ENCOUNTER — Encounter: Payer: Self-pay | Admitting: Sports Medicine

## 2022-08-29 ENCOUNTER — Ambulatory Visit (INDEPENDENT_AMBULATORY_CARE_PROVIDER_SITE_OTHER): Payer: Medicare Other

## 2022-08-29 VITALS — BP 109/74 | HR 81 | Temp 97.9°F | Resp 18 | Ht 67.0 in | Wt 128.0 lb

## 2022-08-29 DIAGNOSIS — M810 Age-related osteoporosis without current pathological fracture: Secondary | ICD-10-CM | POA: Diagnosis not present

## 2022-08-29 MED ORDER — DENOSUMAB 60 MG/ML ~~LOC~~ SOSY
60.0000 mg | PREFILLED_SYRINGE | Freq: Once | SUBCUTANEOUS | Status: AC
  Administered 2022-08-29: 60 mg via SUBCUTANEOUS

## 2022-08-29 NOTE — Patient Instructions (Signed)
Denosumab Injection (Osteoporosis) What is this medication? DENOSUMAB (den oh SUE mab) prevents and treats osteoporosis. It works by making your bones stronger and less likely to break (fracture). It is a monoclonal antibody. This medicine may be used for other purposes; ask your health care provider or pharmacist if you have questions. COMMON BRAND NAME(S): Prolia What should I tell my care team before I take this medication? They need to know if you have any of these conditions: Dental or gum disease Had thyroid or parathyroid (glands located in neck) surgery Having dental surgery or a tooth pulled Kidney disease Low levels of calcium in the blood On dialysis Poor nutrition Thyroid disease Trouble absorbing nutrients from your food An unusual or allergic reaction to denosumab, other medications, foods, dyes, or preservatives Pregnant or trying to get pregnant Breastfeeding How should I use this medication? This medication is injected under the skin. It is given by your care team in a hospital or clinic setting. A special MedGuide will be given to you before each treatment. Be sure to read this information carefully each time. Talk to your care team about the use of this medication in children. Special care may be needed. Overdosage: If you think you have taken too much of this medicine contact a poison control center or emergency room at once. NOTE: This medicine is only for you. Do not share this medicine with others. What if I miss a dose? Keep appointments for follow-up doses. It is important not to miss your dose. Call your care team if you are unable to keep an appointment. What may interact with this medication? Do not take this medication with any of the following: Other medications that contain denosumab This medication may also interact with the following: Medications that lower your chance of fighting infection Steroid medications, such as prednisone or cortisone This  list may not describe all possible interactions. Give your health care provider a list of all the medicines, herbs, non-prescription drugs, or dietary supplements you use. Also tell them if you smoke, drink alcohol, or use illegal drugs. Some items may interact with your medicine. What should I watch for while using this medication? Your condition will be monitored carefully while you are receiving this medication. You may need blood work done while taking this medication. This medication may increase your risk of getting an infection. Call your care team for advice if you get a fever, chills, sore throat, or other symptoms of a cold or flu. Do not treat yourself. Try to avoid being around people who are sick. Tell your dentist and dental surgeon that you are taking this medication. You should not have major dental surgery while on this medication. See your dentist to have a dental exam and fix any dental problems before starting this medication. Take good care of your teeth while on this medication. Make sure you see your dentist for regular follow-up appointments. This medication may cause low levels of calcium in your body. The risk of severe side effects is increased in people with kidney disease. Your care team may prescribe calcium and vitamin D to help prevent low calcium levels while you take this medication. It is important to take calcium and vitamin D as directed by your care team. Talk to your care team if you may be pregnant. Serious birth defects may occur if you take this medication during pregnancy and for 5 months after the last dose. You will need a negative pregnancy test before starting this medication. Contraception   is recommended while taking this medication and for 5 months after the last dose. Your care team can help you find the option that works for you. Talk to your care team before breastfeeding. Changes to your treatment plan may be needed. What side effects may I notice from  receiving this medication? Side effects that you should report to your care team as soon as possible: Allergic reactions--skin rash, itching, hives, swelling of the face, lips, tongue, or throat Infection--fever, chills, cough, sore throat, wounds that don't heal, pain or trouble when passing urine, general feeling of discomfort or being unwell Low calcium level--muscle pain or cramps, confusion, tingling, or numbness in the hands or feet Osteonecrosis of the jaw--pain, swelling, or redness in the mouth, numbness of the jaw, poor healing after dental work, unusual discharge from the mouth, visible bones in the mouth Severe bone, joint, or muscle pain Skin infection--skin redness, swelling, warmth, or pain Side effects that usually do not require medical attention (report these to your care team if they continue or are bothersome): Back pain Headache Joint pain Muscle pain Pain in the hands, arms, legs, or feet Runny or stuffy nose Sore throat This list may not describe all possible side effects. Call your doctor for medical advice about side effects. You may report side effects to FDA at 1-800-FDA-1088. Where should I keep my medication? This medication is given in a hospital or clinic. It will not be stored at home. NOTE: This sheet is a summary. It may not cover all possible information. If you have questions about this medicine, talk to your doctor, pharmacist, or health care provider.  2024 Elsevier/Gold Standard (2022-03-06 00:00:00)  

## 2022-08-29 NOTE — Progress Notes (Signed)
Diagnosis: Osteoporosis  Provider:  Mannam, Praveen MD  Procedure: Injection  Prolia (Denosumab), Dose: 60 mg, Site: subcutaneous, Number of injections: 1  Post Care: Patient declined observation  Discharge: Condition: Good, Destination: Home . AVS Provided  Performed by:  Sunita Ranabhat, RN       

## 2022-09-17 ENCOUNTER — Other Ambulatory Visit: Payer: Self-pay | Admitting: Internal Medicine

## 2022-09-17 ENCOUNTER — Other Ambulatory Visit (HOSPITAL_BASED_OUTPATIENT_CLINIC_OR_DEPARTMENT_OTHER): Payer: Self-pay | Admitting: Cardiovascular Disease

## 2022-09-17 NOTE — Telephone Encounter (Signed)
Please call pt to schedule overdue 1 year follow-up appointment with Dr. Duke Salvia or APP for refills. Last OV 06/2021. Thank you!

## 2022-09-17 NOTE — Telephone Encounter (Signed)
Overdue for visit will not fill without visit scheduled then will consider filling until apt date. This is not the first time patient has failed to make/keep requested follow up for her controlled substances.

## 2022-09-18 ENCOUNTER — Other Ambulatory Visit: Payer: Self-pay | Admitting: Internal Medicine

## 2022-09-18 NOTE — Telephone Encounter (Signed)
Rx request sent to pharmacy.  

## 2022-09-18 NOTE — Telephone Encounter (Signed)
Scheduled 10/08/22 with Gillian Shields, NP

## 2022-09-24 ENCOUNTER — Encounter: Payer: Self-pay | Admitting: Sports Medicine

## 2022-10-01 ENCOUNTER — Telehealth: Payer: Self-pay | Admitting: Internal Medicine

## 2022-10-01 DIAGNOSIS — R197 Diarrhea, unspecified: Secondary | ICD-10-CM | POA: Diagnosis not present

## 2022-10-01 DIAGNOSIS — R350 Frequency of micturition: Secondary | ICD-10-CM | POA: Diagnosis not present

## 2022-10-01 DIAGNOSIS — M1990 Unspecified osteoarthritis, unspecified site: Secondary | ICD-10-CM | POA: Diagnosis not present

## 2022-10-01 DIAGNOSIS — R051 Acute cough: Secondary | ICD-10-CM | POA: Diagnosis not present

## 2022-10-01 DIAGNOSIS — R300 Dysuria: Secondary | ICD-10-CM | POA: Diagnosis not present

## 2022-10-01 DIAGNOSIS — R0600 Dyspnea, unspecified: Secondary | ICD-10-CM | POA: Diagnosis not present

## 2022-10-01 NOTE — Telephone Encounter (Signed)
Prescription Request  10/01/2022  LOV: 01/29/2022 Pt is requesting for a temporary refilled What is the name of the medication or equipment?   traMADol-acetaminophen (ULTRACET) 37.5-325 MG tablet  gabapentin (NEURONTIN) 300 MG capsule  LORazepam (ATIVAN) 1 MG tablet      Have you contacted your pharmacy to request a refill? No   Which pharmacy would you like this sent to?  [facility] [place], [place] - [address] [address] [place] [address] Phone: [phone] Fax: [phone]    Patient notified that their request is being sent to the clinical staff for review and that they should receive a response within 2 business days.   Please advise at Mobile There is no such number on file (mobile).

## 2022-10-02 NOTE — Telephone Encounter (Signed)
Ok to refill 

## 2022-10-02 NOTE — Telephone Encounter (Signed)
She has not followed up in the timeline we requested (not for the first time), has received controlled substances that we prescribe per our contract from another provider which violates her contract. Will not be filled outside visit and depending on visit may be unable to prescribe going forward. Per database gabapentin and lorazepam were prescribed by another provider recently.

## 2022-10-04 ENCOUNTER — Encounter: Payer: Self-pay | Admitting: Sports Medicine

## 2022-10-05 NOTE — Telephone Encounter (Signed)
Pt states that urgent care prescribed her enough until her appointment with Dr Okey Dupre

## 2022-10-08 ENCOUNTER — Ambulatory Visit (HOSPITAL_BASED_OUTPATIENT_CLINIC_OR_DEPARTMENT_OTHER): Payer: Medicare Other | Admitting: Family

## 2022-10-08 ENCOUNTER — Encounter (HOSPITAL_BASED_OUTPATIENT_CLINIC_OR_DEPARTMENT_OTHER): Payer: Self-pay | Admitting: Family

## 2022-10-08 VITALS — BP 118/72 | HR 89 | Ht 67.0 in | Wt 128.0 lb

## 2022-10-08 DIAGNOSIS — I10 Essential (primary) hypertension: Secondary | ICD-10-CM

## 2022-10-08 DIAGNOSIS — I6523 Occlusion and stenosis of bilateral carotid arteries: Secondary | ICD-10-CM | POA: Diagnosis not present

## 2022-10-08 DIAGNOSIS — E785 Hyperlipidemia, unspecified: Secondary | ICD-10-CM

## 2022-10-08 NOTE — Patient Instructions (Signed)
Medication Instructions:   Continue your current medications.   *If you need a refill on your cardiac medications before your next appointment, please call your pharmacy*   Lab Work: Your physician recommends that you return for lab work in the next week or so for fasting labs.   Please return for Lab work. You may come to the...   [facility] (3rd floor) [address], [place], [place] [address]  Open: 8am-Noon and 1pm-4:30pm  Please ring the doorbell on the small table when you exit the elevator and the Lab Tech will come get you  [facility] at [facility] [address], [place], [place] [address] Open: 8am-1pm, then 2pm-4:30pm   [facility]- Please see attached locations sheet stapled to your lab work with address and hours.    If you have labs (blood work) drawn today and your tests are completely normal, you will receive your results only by: MyChart Message (if you have MyChart) OR A paper copy in the mail If you have any lab test that is abnormal or we need to change your treatment, we will call you to review the results.   Testing/Procedures: Your EKG today looked great!  Follow-Up: At [facility], you and your health needs are our priority.  As part of our continuing mission to provide you with exceptional heart care, we have created designated Provider Care Teams.  These Care Teams include your primary Cardiologist (physician) and Advanced Practice Providers (APPs -  Physician Assistants and Nurse Practitioners) who all work together to provide you with the care you need, when you need it.  We recommend signing up for the patient portal called "MyChart".  Sign up information is provided on this After Visit Summary.  MyChart is used to connect with patients for Virtual Visits (Telemedicine).  Patients are able to view lab/test results, encounter notes, upcoming appointments, etc.  Non-urgent messages can be sent to  your provider as well.   To learn more about what you can do with MyChart, go to [email or web address].    Your next appointment:   1 year(s)  Provider:   Chilton Si, MD or Gillian Shields, NP    Other Instructions  Heart Healthy Diet Recommendations: A low-salt diet is recommended. Meats should be grilled, baked, or boiled. Avoid fried foods. Focus on lean protein sources like fish or chicken with vegetables and fruits. The American Heart Association is a Chief Technology Officer!  American Heart Association Diet and Lifeystyle Recommendations   Exercise recommendations: The American Heart Association recommends 150 minutes of moderate intensity exercise weekly. Try 30 minutes of moderate intensity exercise 4-5 times per week. This could include walking, jogging, or swimming.  Recommend gradually increasing your exercise as your knee tolerates.   If you want a referral to [facility], please let us know!

## 2022-10-08 NOTE — Progress Notes (Signed)
Cardiology Office Note:  .   Date:  10/08/2022  ID:  Hailey [name], DOB [birth year], MRN [id number] PCP: Myrlene Broker, MD  [facility] Providers Cardiologist:  Chilton Si, MD    History of Present Illness: .   Hailey [name] is a 82 y.o. female with hx of HTN, HLD, subarachnoid hemorrhage, alcohol abuse, carotid stenosis.  Established 11/2014 for exertional dyspnea.  Echo 12/2014 grade 1 diastolic dysfunction, mild dilation of ascending Ander, normal LVEF, no RWMA.  Bilateral 1-39% stenosis on carotid Dopplers.  She was seen 08/2015 after 2 episodes of syncope for which she independently stopped her blood pressure medications with no recurrent symptoms.  This was in the setting of 20 pound weight loss.  Admitted 11/2020 with episode of syncope felt to be orthostatic.  She had 30-40 point drop with positional change.  Echo that admission LVEF greater than 75%, grade 1 diastolic dysfunction.  Head CT reviewed acute subdural hematoma.  Imaging stable on MRI so no intervention required.  She was encouraged to start wearing compression stockings.  Last seen 07/04/2021 doing well from a cardiac perspective.  She was having chronic knee pain which was improved with injections.  She was taking atorvastatin more regularly with plan to increase exercise and repeat lipid panel in 3 months.  Presents today for follow up independently. She has had knee replaced since her last visit. Notes having a cold for the last month with sensation of pain in all of her joints, feeling hot/cold, difficulty sleeping. Did test negative for COVID/RSV. Was given refill of scheduled substance by provider at urgent care ([facility] in [facility]) is working with her PCP on her controlled substance contract. Does have exercise bike which she plans to start using gradually. Eating at home and out but does try to follow a heart healthy diet.   MVH:QIONGE see the history of present illness.    All other systems  reviewed and are negative.   Studies Reviewed: Marland Kitchen   EKG Interpretation Date/Time:  Monday October 08 2022 14:33:17 EDT Ventricular Rate:  89 PR Interval:  170 QRS Duration:  70 QT Interval:  362 QTC Calculation: 440 R Axis:   -2  Text Interpretation: Normal sinus rhythm Low voltage QRS Confirmed by Gillian Shields ([id number]) on 10/08/2022 2:36:27 PM    Cardiac Studies & Procedures     STRESS TESTS  MYOCARDIAL PERFUSION IMAGING 12/07/2014  Narrative  Nuclear stress EF: 67%.  The left ventricular ejection fraction is hyperdynamic (>65%).  There was no ST segment deviation noted during stress.  This is a low risk study.  Technically suboptimal due to subdiaphragmatic activity; low risk stress nuclear study with a medium size, severe intensity, fixed inferior apical defect suggestive of prior infarct; no ischemia; EF 67 with normal wall motion.   ECHOCARDIOGRAM  ECHOCARDIOGRAM LIMITED 11/19/2020  Narrative ECHOCARDIOGRAM REPORT    Patient Name:   Hailey [name] Date of Exam: 11/19/2020 Medical Rec #:  [id number]     Height:       67.0 in Accession #:    [id number]    Weight:       127.0 lb Date of Birth:  February 03, 1941     BSA:          1.667 m Patient Age:    80 years      BP:           115/77 mmHg Patient Gender: F  HR:           80 bpm. Exam Location:  Inpatient  Procedure: Limited Echo, Limited Color Doppler and Cardiac Doppler  Indications:    syncope  History:        Patient has prior history of Echocardiogram examinations, most recent 12/23/2014. Risk Factors:Dyslipidemia.  Sonographer:    Delcie Roch RDCS Referring Phys: [id number] Angie Fava   Sonographer Comments: Technically difficult study due to poor echo windows, Technically challenging study due to limited acoustic windows, no parasternal window and no apical window. Image acquisition challenging due to respiratory motion. IMPRESSIONS   1. Left ventricular ejection fraction, by  estimation, is >75%. The left ventricle has hyperdynamic function. The left ventricle has no regional wall motion abnormalities. Left ventricular diastolic parameters are consistent with Grade I diastolic dysfunction (impaired relaxation). 2. Right ventricular systolic function is normal. The right ventricular size is normal. 3. The mitral valve is normal in structure. Trivial mitral valve regurgitation. No evidence of mitral stenosis. Moderate mitral annular calcification. 4. The aortic valve is normal in structure. Aortic valve regurgitation is trivial. No aortic stenosis is present. 5. The inferior vena cava is normal in size with greater than 50% respiratory variability, suggesting right atrial pressure of 3 mmHg.  FINDINGS Left Ventricle: Left ventricular ejection fraction, by estimation, is >75%. The left ventricle has hyperdynamic function. The left ventricle has no regional wall motion abnormalities. The left ventricular internal cavity size was normal in size. There is no left ventricular hypertrophy. Left ventricular diastolic parameters are consistent with Grade I diastolic dysfunction (impaired relaxation).  Right Ventricle: The right ventricular size is normal. No increase in right ventricular wall thickness. Right ventricular systolic function is normal.  Left Atrium: Left atrial size was normal in size.  Right Atrium: Right atrial size was normal in size.  Pericardium: There is no evidence of pericardial effusion.  Mitral Valve: The mitral valve is normal in structure. Moderate mitral annular calcification. Trivial mitral valve regurgitation. No evidence of mitral valve stenosis.  Tricuspid Valve: The tricuspid valve is normal in structure. Tricuspid valve regurgitation is not demonstrated. No evidence of tricuspid stenosis.  Aortic Valve: The aortic valve is normal in structure. Aortic valve regurgitation is trivial. No aortic stenosis is present.  Pulmonic Valve: The  pulmonic valve was normal in structure. Pulmonic valve regurgitation is not visualized. No evidence of pulmonic stenosis.  Aorta: The aortic root is normal in size and structure.  Venous: The inferior vena cava is normal in size with greater than 50% respiratory variability, suggesting right atrial pressure of 3 mmHg.  IAS/Shunts: No atrial level shunt detected by color flow Doppler.    Diastology LV e' medial:    7.72 cm/s LV E/e' medial:  9.8 LV e' lateral:   8.27 cm/s LV E/e' lateral: 9.2   LEFT ATRIUM           Index        RIGHT ATRIUM           Index LA Vol (A4C): 48.3 ml 28.97 ml/m  RA Area:     11.50 cm RA Volume:   24.10 ml  14.45 ml/m AORTIC VALVE LVOT Vmax:   90.00 cm/s LVOT Vmean:  60.200 cm/s LVOT VTI:    0.187 m  MITRAL VALVE MV Area (PHT): 4.21 cm     SHUNTS MV Decel Time: 180 msec     Systemic VTI: 0.19 m MV E velocity: 75.80 cm/s MV A velocity: 121.00 cm/s  MV E/A ratio:  0.63  Donato Schultz MD Electronically signed by Donato Schultz MD Signature Date/Time: 11/19/2020/1:06:22 PM    Final             Risk Assessment/Calculations:             Physical Exam:   VS:  BP 118/72   Pulse 89   Ht 5\' 7"  (1.702 m)   Wt 128 lb (58.1 kg)   BMI 20.05 kg/m    Wt Readings from Last 3 Encounters:  10/08/22 128 lb (58.1 kg)  08/29/22 128 lb (58.1 kg)  05/17/22 128 lb (58.1 kg)    GEN: Well nourished, well developed in no acute distress NECK: No JVD; No carotid bruits CARDIAC: RRR, no murmurs, rubs, gallops RESPIRATORY:  Clear to auscultation without rales, wheezing or rhonchi  ABDOMEN: Soft, non-tender, non-distended EXTREMITIES:  No edema; No deformity   ASSESSMENT AND PLAN: .    HTN- Presently managed with diet and exercise. No recurrent episodes of hypotension.   Carotid artery disease-12/2020 bilateral minimal wall thickening or plaque.  Continue optimal lipid control, as below.  HLD, LDL goal less than 70- Continue Atorvastatin 40mg  daily.  Updated lipid panel, CMP ordered today for her to collect over the next couple weeks. She cannot collect today as not fasting.        Dispo: follow up in 1 year  Signed, Alver Sorrow, NP

## 2022-10-12 ENCOUNTER — Ambulatory Visit: Payer: Medicare Other | Admitting: Internal Medicine

## 2022-10-12 MED ORDER — TRAMADOL-ACETAMINOPHEN 37.5-325 MG PO TABS: 1.0000 | ORAL_TABLET | Freq: Every day | ORAL | 0 refills | Status: DC

## 2022-10-12 MED ORDER — LORAZEPAM 0.5 MG PO TABS: 0.5000 mg | ORAL_TABLET | Freq: Every day | ORAL | 0 refills | Status: AC

## 2022-10-12 NOTE — Addendum Note (Signed)
Addended by: Hillard Danker A on: 10/12/2022 11:55 AM   Modules accepted: Orders

## 2022-10-12 NOTE — Telephone Encounter (Signed)
Patient did show up to today's apt too late to be seen and had to schedule a new appointment. She is prescribed a 5 day wean so she can safely stop her medication as her next scheduled apt is 10/30/22 and she is out of medication as of today. She is prescribed tramadol/apap 37.5/325 1 pill daily for 5 days then stop as well as lorazepam 0.5 mg daily for 5 days then stop. This was sent to her pharmacy on file. She will not be prescribed any other controlled substances outside of visit with PCP only due to violation of contract and lack of appropriate follow up which is recurrent behavior.

## 2022-10-17 ENCOUNTER — Other Ambulatory Visit: Payer: Self-pay | Admitting: Internal Medicine

## 2022-10-17 DIAGNOSIS — M1990 Unspecified osteoarthritis, unspecified site: Secondary | ICD-10-CM

## 2022-10-30 ENCOUNTER — Encounter: Payer: Self-pay | Admitting: Internal Medicine

## 2022-10-30 ENCOUNTER — Ambulatory Visit (INDEPENDENT_AMBULATORY_CARE_PROVIDER_SITE_OTHER): Payer: Medicare Other | Admitting: Internal Medicine

## 2022-10-30 VITALS — BP 100/80 | HR 77 | Temp 97.6°F | Ht 67.0 in | Wt 129.0 lb

## 2022-10-30 DIAGNOSIS — I6523 Occlusion and stenosis of bilateral carotid arteries: Secondary | ICD-10-CM

## 2022-10-30 DIAGNOSIS — G4701 Insomnia due to medical condition: Secondary | ICD-10-CM

## 2022-10-30 DIAGNOSIS — Z23 Encounter for immunization: Secondary | ICD-10-CM

## 2022-10-30 DIAGNOSIS — M1990 Unspecified osteoarthritis, unspecified site: Secondary | ICD-10-CM | POA: Diagnosis not present

## 2022-10-30 DIAGNOSIS — I5032 Chronic diastolic (congestive) heart failure: Secondary | ICD-10-CM

## 2022-10-30 DIAGNOSIS — G894 Chronic pain syndrome: Secondary | ICD-10-CM | POA: Diagnosis not present

## 2022-10-30 DIAGNOSIS — F3341 Major depressive disorder, recurrent, in partial remission: Secondary | ICD-10-CM

## 2022-10-30 DIAGNOSIS — I10 Essential (primary) hypertension: Secondary | ICD-10-CM | POA: Diagnosis not present

## 2022-10-30 DIAGNOSIS — F1010 Alcohol abuse, uncomplicated: Secondary | ICD-10-CM

## 2022-10-30 DIAGNOSIS — G4700 Insomnia, unspecified: Secondary | ICD-10-CM

## 2022-10-30 DIAGNOSIS — G8929 Other chronic pain: Secondary | ICD-10-CM

## 2022-10-30 MED ORDER — TRAMADOL-ACETAMINOPHEN 37.5-325 MG PO TABS: 1.0000 | ORAL_TABLET | Freq: Every day | ORAL | 5 refills | Status: DC

## 2022-10-30 MED ORDER — LORAZEPAM 1 MG PO TABS: 1.0000 mg | ORAL_TABLET | Freq: Every day | ORAL | 1 refills | Status: DC

## 2022-10-30 MED ORDER — ESCITALOPRAM OXALATE 20 MG PO TABS: 20.0000 mg | ORAL_TABLET | Freq: Every day | ORAL | 3 refills | Status: DC

## 2022-10-30 MED ORDER — ZOLPIDEM TARTRATE 5 MG PO TABS
5.0000 mg | ORAL_TABLET | Freq: Every evening | ORAL | 1 refills | Status: DC | PRN
Start: 2022-10-30 — End: 2023-04-29

## 2022-10-30 MED ORDER — GABAPENTIN 300 MG PO CAPS
300.0000 mg | ORAL_CAPSULE | Freq: Two times a day (BID) | ORAL | 1 refills | Status: DC
Start: 2022-10-30 — End: 2023-04-29

## 2022-10-30 NOTE — Progress Notes (Signed)
Subjective:   Patient ID: Hailey [name], female    DOB: 01-28-1941, 82 y.o.   MRN: [id number]  HPI The patient is an 82 YO female coming in for follow up medications. She did not come as requested for routine follow up of opioids and was stopped on this. She has been off lorazepam and tramadol for a few weeks.She is in increasing pain and is unable to sleep without the lorazepam and wishes to resume. It is hard for her to get to office.  Review of Systems  Constitutional: Negative.   HENT: Negative.    Eyes: Negative.   Respiratory:  Negative for cough, chest tightness and shortness of breath.   Cardiovascular:  Negative for chest pain, palpitations and leg swelling.  Gastrointestinal:  Negative for abdominal distention, abdominal pain, constipation, diarrhea, nausea and vomiting.  Musculoskeletal:  Positive for arthralgias, gait problem and myalgias.  Skin: Negative.   Psychiatric/Behavioral: Negative.      Objective:  Physical Exam Constitutional:      Appearance: She is well-developed.  HENT:     Head: Normocephalic and atraumatic.  Cardiovascular:     Rate and Rhythm: Normal rate and regular rhythm.  Pulmonary:     Effort: Pulmonary effort is normal. No respiratory distress.     Breath sounds: Normal breath sounds. No wheezing or rales.  Abdominal:     General: Bowel sounds are normal. There is no distension.     Palpations: Abdomen is soft.     Tenderness: There is no abdominal tenderness. There is no rebound.  Musculoskeletal:        General: Tenderness present.     Cervical back: Normal range of motion.  Skin:    General: Skin is warm and dry.  Neurological:     Mental Status: She is alert and oriented to person, place, and time.     Coordination: Coordination abnormal.     Vitals:   10/30/22 1417  BP: 100/80  Pulse: 77  Temp: 97.6 F (36.4 C)  TempSrc: Oral  SpO2: 96%  Weight: 129 lb (58.5 kg)  Height: 5\' 7"  (1.702 m)    Assessment & Plan:  Flu shot  given at visit

## 2022-10-30 NOTE — Patient Instructions (Addendum)
We have given you the flu shot.   We have sent in all the refills. We do prolia here if you want to switch to here.

## 2022-11-02 DIAGNOSIS — F3341 Major depressive disorder, recurrent, in partial remission: Secondary | ICD-10-CM | POA: Insufficient documentation

## 2022-11-02 NOTE — Assessment & Plan Note (Signed)
She has been sleeping very poorly off her lorazepam. She has high risk features with ambien and lorazepam for sleep but this combination has worked. She understands safety concerns and wishes to continue. Refilled lorazepam today.

## 2022-11-02 NOTE — Assessment & Plan Note (Signed)
No flare today and is not on medications. BP at goal. Checking lipid panel for risk reduction.

## 2022-11-02 NOTE — Assessment & Plan Note (Signed)
She is aware that alcohol use is not recommended in conjunction with her multiple controlled substances we prescribe.

## 2022-11-02 NOTE — Assessment & Plan Note (Signed)
We discussed that she has signed pain contract and we should exclusively be getting this from [place]. She is not able in future to get from an outside provider if she is not coming for visits. She is due for visit in 6 months. She is taking tramadol/APAP #45/30 days. Refilled today.

## 2022-11-02 NOTE — Assessment & Plan Note (Addendum)
Checking lipid panel and adjust as needed her lipitor 40 mg daily.

## 2022-11-02 NOTE — Assessment & Plan Note (Addendum)
Still taking lexapro 20 mg daily and will continue. Symptoms overall controlled.

## 2022-11-22 DIAGNOSIS — Z96652 Presence of left artificial knee joint: Secondary | ICD-10-CM | POA: Diagnosis not present

## 2022-12-14 DIAGNOSIS — Z23 Encounter for immunization: Secondary | ICD-10-CM | POA: Diagnosis not present

## 2023-01-23 ENCOUNTER — Other Ambulatory Visit (HOSPITAL_BASED_OUTPATIENT_CLINIC_OR_DEPARTMENT_OTHER): Payer: Self-pay | Admitting: Internal Medicine

## 2023-01-23 DIAGNOSIS — E782 Mixed hyperlipidemia: Secondary | ICD-10-CM

## 2023-01-29 NOTE — Progress Notes (Unsigned)
   Acute Office Visit  Subjective:     Patient ID: Hailey [name], female    DOB: [birth year], 82 y.o.   MRN: [id number]  No chief complaint on file.   HPI Patient is in today for evaluation of hematuria for the last ***. Reports Has tried Denies Denies other concerns today. Medical hx as outlined below.  ROS Per HPI      Objective:    There were no vitals taken for this visit.   Physical Exam Vitals and nursing note reviewed.  Constitutional:      Appearance: Normal appearance. She is normal weight.  HENT:     Head: Normocephalic and atraumatic.     Right Ear: Tympanic membrane and ear canal normal.     Left Ear: Tympanic membrane and ear canal normal.     Nose: Nose normal.  Eyes:     Extraocular Movements: Extraocular movements intact.     Pupils: Pupils are equal, round, and reactive to light.  Cardiovascular:     Rate and Rhythm: Normal rate and regular rhythm.     Heart sounds: Normal heart sounds.  Pulmonary:     Effort: Pulmonary effort is normal.     Breath sounds: Normal breath sounds.  Musculoskeletal:        General: Normal range of motion.     Cervical back: Normal range of motion.  Neurological:     General: No focal deficit present.     Mental Status: She is alert and oriented to person, place, and time.  Psychiatric:        Mood and Affect: Mood normal.        Thought Content: Thought content normal.   No results found for any visits on 01/30/23.      Assessment & Plan:  ***  No orders of the defined types were placed in this encounter.   No follow-ups on file.  Moshe Cipro, FNP

## 2023-01-30 ENCOUNTER — Encounter: Payer: Self-pay | Admitting: Family Medicine

## 2023-01-30 ENCOUNTER — Ambulatory Visit (INDEPENDENT_AMBULATORY_CARE_PROVIDER_SITE_OTHER): Payer: Medicare Other | Admitting: Family Medicine

## 2023-01-30 ENCOUNTER — Other Ambulatory Visit: Payer: Self-pay | Admitting: Family Medicine

## 2023-01-30 ENCOUNTER — Other Ambulatory Visit: Payer: Self-pay

## 2023-01-30 VITALS — BP 90/56 | HR 87 | Temp 97.7°F | Ht 67.0 in | Wt 127.6 lb

## 2023-01-30 DIAGNOSIS — R310 Gross hematuria: Secondary | ICD-10-CM | POA: Diagnosis not present

## 2023-01-30 LAB — POCT URINALYSIS DIPSTICK
Bilirubin, UA: NEGATIVE
Blood, UA: NEGATIVE
Glucose, UA: NEGATIVE
Ketones, UA: NEGATIVE
Nitrite, UA: NEGATIVE
Protein, UA: NEGATIVE
Spec Grav, UA: 1.01 (ref 1.010–1.025)
Urobilinogen, UA: NEGATIVE U/dL — AB
pH, UA: 7.5 (ref 5.0–8.0)

## 2023-01-30 MED ORDER — CEPHALEXIN 500 MG PO CAPS: 500.0000 mg | ORAL_CAPSULE | Freq: Two times a day (BID) | ORAL | 0 refills | Status: AC

## 2023-01-30 NOTE — Addendum Note (Signed)
Addended by: Sherald Barge on: 01/30/2023 01:35 PM   Modules accepted: Orders

## 2023-01-30 NOTE — Addendum Note (Signed)
Addended by: Sherald Barge on: 01/30/2023 02:49 PM   Modules accepted: Orders

## 2023-01-30 NOTE — Patient Instructions (Signed)
I have sent in keflex for you to take twice a day for 3 days. Please eat when you take this medication, it can upset your stomach if you do not.   Your urine was concerning for infection in the office today. We have sent it to the lab for culture and confirmation.  Follow-up with me for new or worsening symptoms.

## 2023-02-02 LAB — URINE CULTURE

## 2023-03-01 ENCOUNTER — Telehealth: Payer: Self-pay

## 2023-03-01 NOTE — Telephone Encounter (Signed)
Auth Submission: NO AUTH NEEDED Site of care: Site of care: [facility] Payer: MEDICARE A/B & AARP Medication & CPT/J Code(s) submitted: Prolia (Denosumab) [id number] Route of submission (phone, fax, portal):  Phone # Fax # Auth type: Buy/Bill Units/visits requested: 60mg  x 2 doses Reference number:  Approval from: 08/23/22 to 03/14/24

## 2023-03-04 ENCOUNTER — Ambulatory Visit: Payer: Medicare Other

## 2023-03-04 VITALS — BP 124/84 | HR 79 | Temp 97.5°F | Resp 20 | Ht 67.0 in | Wt 132.4 lb

## 2023-03-04 DIAGNOSIS — M810 Age-related osteoporosis without current pathological fracture: Secondary | ICD-10-CM | POA: Diagnosis not present

## 2023-03-04 MED ORDER — DENOSUMAB 60 MG/ML ~~LOC~~ SOSY
60.0000 mg | PREFILLED_SYRINGE | Freq: Once | SUBCUTANEOUS | Status: AC
  Administered 2023-03-04: 60 mg via SUBCUTANEOUS
  Filled 2023-03-04: qty 1

## 2023-03-04 NOTE — Progress Notes (Signed)
Diagnosis: Osteoporosis  Provider:  Chilton Greathouse MD  Procedure: Injection  Prolia (Denosumab), Dose: 60 mg, Site: subcutaneous, Number of injections: 1  Injection Site(s): Left upper quad. abdomen  Post Care: Patient declined observation  Discharge: Condition: Stable, Destination: Home . AVS Provided  Performed by:  Wyvonne Lenz, RN

## 2023-04-22 ENCOUNTER — Encounter: Payer: Self-pay | Admitting: Sports Medicine

## 2023-04-29 ENCOUNTER — Encounter: Payer: Self-pay | Admitting: Internal Medicine

## 2023-04-29 ENCOUNTER — Ambulatory Visit: Payer: Medicare Other | Admitting: Internal Medicine

## 2023-04-29 VITALS — BP 114/60 | HR 81 | Temp 97.7°F | Ht 67.0 in | Wt 127.0 lb

## 2023-04-29 DIAGNOSIS — I10 Essential (primary) hypertension: Secondary | ICD-10-CM

## 2023-04-29 DIAGNOSIS — G8929 Other chronic pain: Secondary | ICD-10-CM

## 2023-04-29 DIAGNOSIS — M79604 Pain in right leg: Secondary | ICD-10-CM | POA: Diagnosis not present

## 2023-04-29 DIAGNOSIS — G4700 Insomnia, unspecified: Secondary | ICD-10-CM

## 2023-04-29 DIAGNOSIS — F3341 Major depressive disorder, recurrent, in partial remission: Secondary | ICD-10-CM | POA: Diagnosis not present

## 2023-04-29 DIAGNOSIS — R300 Dysuria: Secondary | ICD-10-CM | POA: Diagnosis not present

## 2023-04-29 DIAGNOSIS — I5032 Chronic diastolic (congestive) heart failure: Secondary | ICD-10-CM

## 2023-04-29 DIAGNOSIS — G894 Chronic pain syndrome: Secondary | ICD-10-CM

## 2023-04-29 DIAGNOSIS — M79605 Pain in left leg: Secondary | ICD-10-CM

## 2023-04-29 DIAGNOSIS — I6523 Occlusion and stenosis of bilateral carotid arteries: Secondary | ICD-10-CM | POA: Diagnosis not present

## 2023-04-29 DIAGNOSIS — E7841 Elevated Lipoprotein(a): Secondary | ICD-10-CM | POA: Diagnosis not present

## 2023-04-29 DIAGNOSIS — E673 Hypervitaminosis D: Secondary | ICD-10-CM

## 2023-04-29 DIAGNOSIS — G4701 Insomnia due to medical condition: Secondary | ICD-10-CM | POA: Diagnosis not present

## 2023-04-29 DIAGNOSIS — M1990 Unspecified osteoarthritis, unspecified site: Secondary | ICD-10-CM | POA: Diagnosis not present

## 2023-04-29 MED ORDER — ZOLPIDEM TARTRATE 5 MG PO TABS: 5.0000 mg | ORAL_TABLET | Freq: Every evening | ORAL | 1 refills | Status: AC | PRN

## 2023-04-29 MED ORDER — ESCITALOPRAM OXALATE 20 MG PO TABS: 20.0000 mg | ORAL_TABLET | Freq: Every day | ORAL | 3 refills | Status: DC

## 2023-04-29 MED ORDER — LORAZEPAM 1 MG PO TABS
1.0000 mg | ORAL_TABLET | Freq: Every day | ORAL | 1 refills | Status: AC
Start: 2023-04-29 — End: ?

## 2023-04-29 MED ORDER — GABAPENTIN 300 MG PO CAPS: 300.0000 mg | ORAL_CAPSULE | Freq: Two times a day (BID) | ORAL | 3 refills | Status: AC

## 2023-04-29 MED ORDER — TRAMADOL-ACETAMINOPHEN 37.5-325 MG PO TABS
1.0000 | ORAL_TABLET | Freq: Every day | ORAL | 5 refills | Status: AC
Start: 2023-04-29 — End: ?
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
## 2023-04-29 NOTE — Patient Instructions (Signed)
 Let us know if the legs do not improve.  We are checking the labs today.

## 2023-05-03 DIAGNOSIS — M79606 Pain in leg, unspecified: Secondary | ICD-10-CM | POA: Insufficient documentation

## 2023-05-03 NOTE — Assessment & Plan Note (Signed)
 Given multiple substances am uncomfortable to titrate pain medication and have asked her to return to her back specialist to see if they can improve her chronic pain. Did refill tramadol and checked PDMP and ordered UDS as due.

## 2023-05-03 NOTE — Assessment & Plan Note (Signed)
Checking lipid panel and adjust statin as needed.  

## 2023-05-03 NOTE — Assessment & Plan Note (Signed)
 Uses lorazepam and ambien at night time and had horrible insomnia previous. Understands risk of combined controlled substances and she agrees to continue. Reviewed PDMP and checking UDS today.

## 2023-05-03 NOTE — Assessment & Plan Note (Signed)
 Taking statin and will continue. Checking lipid panel.

## 2023-05-03 NOTE — Assessment & Plan Note (Signed)
 No flare today, checking CBC and CMP. Taking statin only currently.
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
## 2023-05-03 NOTE — Assessment & Plan Note (Signed)
 Taking lexparo 20 mg daily and controlled with mild symptoms remaining. Continue.

## 2023-05-03 NOTE — Assessment & Plan Note (Signed)
 BP at goal off meds. Checking cBC and CMP and adjust as needed.

## 2023-05-09 ENCOUNTER — Other Ambulatory Visit (INDEPENDENT_AMBULATORY_CARE_PROVIDER_SITE_OTHER)

## 2023-05-09 DIAGNOSIS — R300 Dysuria: Secondary | ICD-10-CM | POA: Diagnosis not present

## 2023-05-09 DIAGNOSIS — E673 Hypervitaminosis D: Secondary | ICD-10-CM

## 2023-05-09 DIAGNOSIS — G894 Chronic pain syndrome: Secondary | ICD-10-CM | POA: Diagnosis not present

## 2023-05-09 DIAGNOSIS — I10 Essential (primary) hypertension: Secondary | ICD-10-CM

## 2023-05-09 NOTE — Addendum Note (Signed)
 Addended by: Levonne Lapping on: 05/09/2023 04:22 PM   Modules accepted: Orders

## 2023-05-10 LAB — VITAMIN D 25 HYDROXY (VIT D DEFICIENCY, FRACTURES): VITD: 78.72 ng/mL (ref 30.00–100.00)

## 2023-05-10 LAB — URINALYSIS, ROUTINE W REFLEX MICROSCOPIC
Bilirubin Urine: NEGATIVE
Hgb urine dipstick: NEGATIVE
Ketones, ur: NEGATIVE
Nitrite: POSITIVE — AB
Specific Gravity, Urine: 1.01 (ref 1.000–1.030)
Total Protein, Urine: NEGATIVE
Urine Glucose: NEGATIVE
Urobilinogen, UA: 0.2 (ref 0.0–1.0)
pH: 7 (ref 5.0–8.0)

## 2023-05-10 LAB — LIPID PANEL
Cholesterol: 224 mg/dL — ABNORMAL HIGH (ref 0–200)
HDL: 41.9 mg/dL (ref >39.00)
LDL Cholesterol: 134 mg/dL — ABNORMAL HIGH (ref 0–99)
NonHDL: 182.16
Total CHOL/HDL Ratio: 5
Triglycerides: 243 mg/dL — ABNORMAL HIGH (ref 0.0–149.0)
VLDL: 48.6 mg/dL — ABNORMAL HIGH (ref 0.0–40.0)

## 2023-05-10 LAB — COMPREHENSIVE METABOLIC PANEL WITH GFR
ALT: 12 U/L (ref 0–35)
AST: 20 U/L (ref 0–37)
Albumin: 4.4 g/dL (ref 3.5–5.2)
Alkaline Phosphatase: 42 U/L (ref 39–117)
BUN: 25 mg/dL — ABNORMAL HIGH (ref 6–23)
CO2: 29 meq/L (ref 19–32)
Calcium: 9.8 mg/dL (ref 8.4–10.5)
Chloride: 98 meq/L (ref 96–112)
Creatinine, Ser: 0.79 mg/dL (ref 0.40–1.20)
GFR: 69.58 mL/min (ref >60.00)
Glucose, Bld: 85 mg/dL (ref 70–99)
Potassium: 4.2 meq/L (ref 3.5–5.1)
Sodium: 138 meq/L (ref 135–145)
Total Bilirubin: 0.4 mg/dL (ref 0.2–1.2)
Total Protein: 7.1 g/dL (ref 6.0–8.3)

## 2023-05-10 LAB — CBC
HCT: 36.9 % (ref 36.0–46.0)
Hemoglobin: 12.2 g/dL (ref 12.0–15.0)
MCHC: 33.2 g/dL (ref 30.0–36.0)
MCV: 91.8 fl (ref 78.0–100.0)
Platelets: 200 10*3/uL (ref 150.0–400.0)
RBC: 4.02 Mil/uL (ref 3.87–5.11)
RDW: 14.2 % (ref 11.5–15.5)
WBC: 7 10*3/uL (ref 4.0–10.5)

## 2023-05-11 LAB — DRUG MONITOR,BARBITURATE,W/CONF, URINE: Barbiturates: NEGATIVE ng/mL (ref <300)

## 2023-05-11 LAB — DRUG MONITOR, BENZO,W/CONF, URINE
Alphahydroxyalprazolam: NEGATIVE ng/mL (ref <25)
Alphahydroxymidazolam: NEGATIVE ng/mL (ref <50)
Alphahydroxytriazolam: NEGATIVE ng/mL (ref <50)
Aminoclonazepam: NEGATIVE ng/mL (ref <25)
Benzodiazepines: POSITIVE ng/mL — AB (ref <100)
Hydroxyethylflurazepam: NEGATIVE ng/mL (ref <50)
Lorazepam: 1148 ng/mL — ABNORMAL HIGH (ref <50)
Nordiazepam: NEGATIVE ng/mL (ref <50)
Oxazepam: NEGATIVE ng/mL (ref <50)
Temazepam: NEGATIVE ng/mL (ref <50)

## 2023-05-11 LAB — DM TEMPLATE

## 2023-05-11 LAB — DRUG MONITOR, OPIATES,W/CONF, URINE: Opiates: NEGATIVE ng/mL (ref <100)

## 2023-05-11 LAB — DRUG MONITOR, TRAMADOL,QN, URINE
Desmethyltramadol: 9012 ng/mL — ABNORMAL HIGH (ref <100)
Tramadol: 8468 ng/mL — ABNORMAL HIGH (ref <100)

## 2023-05-11 LAB — DRUG MONITOR, COCAINEMETAB, W/CONF, URINE: Cocaine Metabolite: NEGATIVE ng/mL (ref <150)

## 2023-05-11 LAB — DRUG MONITOR, OXYCODONE,W/CONF, URINE: Oxycodone: NEGATIVE ng/mL (ref <100)

## 2023-05-11 LAB — PRESCRIBED DRUGS,MEDMATCH(R)

## 2023-05-11 LAB — DRUG MONITOR,AMPHETAMINE,W/CONF, URINE: Amphetamines: NEGATIVE ng/mL (ref <500)

## 2023-05-13 ENCOUNTER — Other Ambulatory Visit: Payer: Self-pay | Admitting: Internal Medicine

## 2023-05-13 MED ORDER — NITROFURANTOIN MONOHYD MACRO 100 MG PO CAPS: 100.0000 mg | ORAL_CAPSULE | Freq: Two times a day (BID) | ORAL | 0 refills | Status: AC

## 2023-05-14 ENCOUNTER — Encounter: Payer: Self-pay | Admitting: Internal Medicine

## 2023-05-17 ENCOUNTER — Other Ambulatory Visit (HOSPITAL_BASED_OUTPATIENT_CLINIC_OR_DEPARTMENT_OTHER): Payer: Self-pay | Admitting: Family Medicine

## 2023-05-17 DIAGNOSIS — E782 Mixed hyperlipidemia: Secondary | ICD-10-CM

## 2023-07-26 ENCOUNTER — Ambulatory Visit

## 2023-08-02 ENCOUNTER — Other Ambulatory Visit (HOSPITAL_BASED_OUTPATIENT_CLINIC_OR_DEPARTMENT_OTHER): Payer: Self-pay | Admitting: Internal Medicine

## 2023-08-02 DIAGNOSIS — E782 Mixed hyperlipidemia: Secondary | ICD-10-CM

## 2023-09-02 ENCOUNTER — Ambulatory Visit: Payer: Medicare Other

## 2023-09-02 VITALS — BP 118/74 | HR 85 | Temp 97.9°F | Resp 12 | Ht 67.0 in | Wt 134.4 lb

## 2023-09-02 DIAGNOSIS — M810 Age-related osteoporosis without current pathological fracture: Secondary | ICD-10-CM | POA: Diagnosis not present

## 2023-09-02 MED ORDER — DENOSUMAB 60 MG/ML ~~LOC~~ SOSY
60.0000 mg | PREFILLED_SYRINGE | Freq: Once | SUBCUTANEOUS | Status: AC
  Administered 2023-09-02: 60 mg via SUBCUTANEOUS
  Filled 2023-09-02: qty 1

## 2023-09-02 NOTE — Progress Notes (Signed)
 Diagnosis: Osteoporosis  Provider:  Mannam, Praveen MD  Procedure: Injection  Prolia  (Denosumab ), Dose: 60 mg, Site: subcutaneous, Number of injections: 1  Injection Site(s): Right upper quad. abdomen  Post Care: Patient declined observation  Discharge: Condition: Stable, Destination: Home . AVS Declined  Performed by:  Rocky FORBES Sar, RN

## 2023-09-18 ENCOUNTER — Ambulatory Visit (INDEPENDENT_AMBULATORY_CARE_PROVIDER_SITE_OTHER)

## 2023-09-18 VITALS — Ht 67.0 in | Wt 134.0 lb

## 2023-09-18 DIAGNOSIS — Z0000 Encounter for general adult medical examination without abnormal findings: Secondary | ICD-10-CM

## 2023-09-18 DIAGNOSIS — M8580 Other specified disorders of bone density and structure, unspecified site: Secondary | ICD-10-CM | POA: Diagnosis not present

## 2023-09-18 DIAGNOSIS — Z780 Asymptomatic menopausal state: Secondary | ICD-10-CM | POA: Diagnosis not present

## 2023-09-18 NOTE — Patient Instructions (Signed)
 Hailey [name] , Thank you for taking time out of your busy schedule to complete your Annual Wellness Visit with me. I enjoyed our conversation and look forward to speaking with you again next year. I, as well as your care team,  appreciate your ongoing commitment to your health goals. Please review the following plan we discussed and let me know if I can assist you in the future. Your Game plan/ To Do List    Referrals: If you haven't heard from the office you've been referred to, please reach out to them at the phone provided.  You have an order for:   [x]   Bone Density    Please call for appointment:  [facility] [address] [place], [place] [address] [phone]  Make sure to wear two-piece clothing.  No lotions, powders, or deodorants the day of the appointment. Make sure to bring picture ID and insurance card.  Bring list of medications you are currently taking including any supplements.   Follow up Visits: We will see or speak with you next year for your Next Medicare AWV with our clinical staff Have you seen your provider in the last 6 months (3 months if uncontrolled diabetes)? Yes  Clinician Recommendations:  Aim for 30 minutes of exercise or brisk walking, 6-8 glasses of water , and 5 servings of fruits and vegetables each day.  You are due for a Hep B vaccine, remember to discuss with Dr. Rollene during your next office visit.      This is a list of the screenings recommended for you:  Health Maintenance  Topic Date Due   Hepatitis B Vaccine (1 of 3 - Risk 3-dose series) Never done   DEXA scan (bone density measurement)  12/25/2021   COVID-19 Vaccine (3 - 2024-25 season) 10/14/2022   Medicare Annual Wellness Visit  05/17/2023   Flu Shot  09/13/2023   DTaP/Tdap/Td vaccine (3 - Td or Tdap) 12/15/2024   Pneumococcal Vaccine for age over 10  Completed   Zoster (Shingles) Vaccine  Completed   HPV Vaccine  Aged Out   Meningitis B Vaccine  Aged Out     Advanced directives: (Copy Requested) Please bring a copy of your health care power of attorney and living will to the office to be added to your chart at your convenience. You can mail to [facility] [address] [place], [place] [address] or email to [email or web address] Advance Care Planning is important because it:  [x]  Makes sure you receive the medical care that is consistent with your values, goals, and preferences  [x]  It provides guidance to your family and loved ones and reduces their decisional burden about whether or not they are making the right decisions based on your wishes.  Follow the link provided in your after visit summary or read over the paperwork we have mailed to you to help you started getting your Advance Directives in place. If you need assistance in completing these, please reach out to us  so that we can help you!  See attachments for Preventive Care and Fall Prevention Tips.

## 2023-09-18 NOTE — Progress Notes (Addendum)
 Subjective:   Hailey [name] is a 83 y.o. who presents for a Medicare Wellness preventive visit.  As a reminder, Annual Wellness Visits don't include a physical exam, and some assessments may be limited, especially if this visit is performed virtually. We may recommend an in-person follow-up visit with your provider if needed.  Visit Complete: Virtual I connected with  Rock SHAUNNA Chol on 09/18/23 by a audio enabled telemedicine application and verified that I am speaking with the correct person using two identifiers.  Patient Location: Home  Provider Location: Home Office  I discussed the limitations of evaluation and management by telemedicine. The patient expressed understanding and agreed to proceed.  Vital Signs: Because this visit was a virtual/telehealth visit, some criteria may be missing or patient reported. Any vitals not documented were not able to be obtained and vitals that have been documented are patient reported.  VideoDeclined- This patient declined Librarian, academic. Therefore the visit was completed with audio only.  Persons Participating in Visit: Patient.  AWV Questionnaire: No: Patient Medicare AWV questionnaire was not completed prior to this visit.  Cardiac Risk Factors include: advanced age (>40men, >53 women);hypertension;dyslipidemia;Other (see comment), Risk factor comments: CHF     Objective:    Today's Vitals   09/18/23 1536  Weight: 134 lb (60.8 kg)  Height: 5' 7 (1.702 m)   Body mass index is 20.99 kg/m.     09/18/2023    3:41 PM 03/04/2023    3:15 PM 05/17/2022    3:06 PM 05/15/2021    2:58 PM 11/19/2020    5:00 PM 11/18/2020   12:27 PM 10/18/2020    1:57 PM  Advanced Directives  Does Patient Have a Medical Advance Directive? Yes Yes Yes Yes No No Yes  Type of Estate agent of [place];Living will Healthcare Power of [place];Living will Healthcare Power of [place];Living will Living will;Healthcare  Power of Attorney   Living will;Healthcare Power of Attorney  Does patient want to make changes to medical advance directive?    No - Patient declined   No - Patient declined  Copy of Healthcare Power of Attorney in Chart? No - copy requested  No - copy requested No - copy requested     Would patient like information on creating a medical advance directive?     No - Patient declined      Current Medications (verified) Outpatient Encounter Medications as of 09/18/2023  Medication Sig   acetaminophen  (TYLENOL ) 500 MG tablet Take 2 tablets (1,000 mg total) by mouth every 8 (eight) hours as needed for mild pain or moderate pain.   aspirin  EC 81 MG tablet Take 1 tablet (81 mg total) by mouth 2 (two) times daily. For DVT prophylaxis for 30 days after surgery.   atorvastatin  (LIPITOR) 40 MG tablet TAKE ONE TABLET BY MOUTH ONCE DAILY   denosumab  (PROLIA ) 60 MG/ML SOLN injection Inject 60 mg into the skin every 6 (six) months. Administer in upper arm, thigh, or abdomen   diclofenac sodium (VOLTAREN) 1 % GEL Apply 2 g topically at bedtime.   escitalopram  (LEXAPRO ) 20 MG tablet Take 1 tablet (20 mg total) by mouth daily.   fluticasone  (FLONASE ) 50 MCG/ACT nasal spray Use 1 spray each nostril 1-2 times daily as needed (Patient taking differently: Place 1 spray into both nostrils 2 (two) times daily.)   gabapentin  (NEURONTIN ) 300 MG capsule Take 1 capsule (300 mg total) by mouth 2 (two) times daily.   loperamide (IMODIUM)  2 MG capsule Take 2 mg by mouth as needed for diarrhea or loose stools.   LORazepam  (ATIVAN ) 1 MG tablet Take 1 tablet (1 mg total) by mouth at bedtime.   meloxicam  (MOBIC ) 15 MG tablet Take 1 tablet (15 mg total) by mouth daily as needed for pain (and inflammation).   methocarbamol  (ROBAXIN ) 500 MG tablet Take 1 tablet (500 mg total) by mouth every 8 (eight) hours as needed for muscle spasms.   ondansetron  (ZOFRAN  ODT) 4 MG disintegrating tablet Take 1 tablet (4 mg total) by mouth 2 (two)  times daily as needed for nausea or vomiting.   traMADol -acetaminophen  (ULTRACET ) 37.5-325 MG tablet Take 1-2 tablets by mouth daily. This is a 30 day supply   zolpidem  (AMBIEN ) 5 MG tablet Take 1 tablet (5 mg total) by mouth at bedtime as needed.   No facility-administered encounter medications on file as of 09/18/2023.    Allergies (verified) Resorcinol   History: Past Medical History:  Diagnosis Date   Allergic rhinitis, cause unspecified    Carotid artery disease (HCC) 12/13/2014   1-39% stenosis of bilateral CCAs.   Repeat ultrasound 11/2016.   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   OSA (obstructive sleep apnea)    Osteoarthrosis, unspecified whether generalized or localized, unspecified site    Other and unspecified hyperlipidemia    Personal history of colonic adenomas 11/28/2012   Personal history of other disorders of nervous system and sense organs    SAH (subarachnoid hemorrhage) (HCC) 02/02/2014   Unspecified closed fracture of pelvis    x 2 2004/2006   Unspecified essential hypertension    Past Surgical History:  Procedure Laterality Date   ANKLE FUSION Left    BACK SURGERY     ; has  had 8 back surgeries   CERVICAL FUSION     C4-7   COLONOSCOPY     CYSTOSCOPY     HERNIA REPAIR     umbilical   LUMBAR FUSION     L2-3   REVERSE SHOULDER ARTHROPLASTY Left 12/10/2017   Procedure: LEFT REVERSE SHOULDER ARTHROPLASTY;  Surgeon: Josefina Chew, MD;  Location: [facility];  Service: Orthopedics;  Laterality: Left;  Interscalene Block   SACRAL FUSION  07/2012   TIBIA FRACTURE SURGERY Right    TONSILLECTOMY     TOTAL HIP ARTHROPLASTY Right 2012   TOTAL HIP ARTHROPLASTY Left  10/18/2020   Procedure: TOTAL HIP ARTHROPLASTY ANTERIOR APPROACH;  Surgeon: Beverley Evalene BIRCH, MD;  Location: [facility];  Service: Orthopedics;  Laterality: Left;   TUBAL LIGATION     UPPER GASTROINTESTINAL ENDOSCOPY     Family History  Problem Relation Age of Onset   Dementia Mother    Hypertension Father    Heart attack Father    Allergic rhinitis Father    Cancer Maternal Aunt        breast cancer   Alzheimer's disease Maternal Aunt    Stroke Paternal Uncle    Cancer Cousin        breast cancer   Colon cancer Neg Hx    Angioedema Neg Hx    Asthma Neg Hx    Eczema Neg Hx  Social History   Socioeconomic History   Marital status: Widowed    Spouse name: Not on file   Number of children: Not on file   Years of education: Not on file   Highest education level: Not on file  Occupational History   Occupation: OWNER    Employer: [employer]    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   Occupation: RETIRED  Tobacco Use   Smoking status: Former    Current packs/day: 0.00    Average packs/day: 2.0 packs/day for 10.0 years (20.0 ttl pk-yrs)    Types: Cigarettes    Start date: 02/12/1957    Quit date: 02/13/1967    Years since quitting: 56.6   Smokeless tobacco: Never   Tobacco comments:    smoked 1961-1969 , up to 3 ppd, mainly 2 ppd  Vaping Use   Vaping status: Never Used  Substance and Sexual Activity   Alcohol  use: No   Drug use: No   Sexual activity: Never  Other Topics Concern   Not on file  Social History Narrative   Lives alone/2025   Social Drivers of Health   Financial Resource Strain: Low Risk  (09/18/2023)   Overall Financial Resource Strain (CARDIA)    Difficulty of Paying Living Expenses: Not hard at all  Food Insecurity: No Food Insecurity (09/18/2023)   Hunger Vital Sign    Worried About Running Out of Food in the Last Year: Never true    Ran Out of Food in the Last Year: Never true  Transportation Needs: No Transportation Needs (09/18/2023)   PRAPARE - Therapist, art (Medical): No    Lack of Transportation (Non-Medical): No  Physical Activity: Inactive (09/18/2023)   Exercise Vital Sign    Days of Exercise per Week: 0 days    Minutes of Exercise per Session: 0 min  Stress: No Stress Concern Present (09/18/2023)   [employer] of Occupational Health - Occupational Stress Questionnaire    Feeling of Stress: Only a little  Social Connections: Socially Isolated (09/18/2023)   Social Connection and Isolation Panel    Frequency of Communication with Friends and Family: More than three times a week    Frequency of Social Gatherings with Friends and Family: Once a week    Attends Religious Services: Never    Database administrator or Organizations: No    Attends Banker Meetings: Never    Marital Status: Widowed    Tobacco Counseling Counseling given: Not Answered Tobacco comments: smoked 1961-1969 , up to 3 ppd, mainly 2 ppd    Clinical Intake:  Pre-visit preparation completed: Yes  Pain : No/denies pain     BMI - recorded: 20.99 Nutritional Status: BMI of 19-24  Normal Nutritional Risks: None Diabetes: No  Lab Results  Component Value Date   HGBA1C 6.0 03/01/2014   HGBA1C 5.7 03/20/2010   HGBA1C  12/25/2009    5.6 (NOTE)                                                                       According to the ADA Clinical Practice Recommendations for 2011, when HbA1c is used as a screening test:   >=6.5%   Diagnostic of Diabetes Mellitus           (  if abnormal result  is confirmed)  5.7-6.4%   Increased risk of developing Diabetes Mellitus  References:Diagnosis and Classification of Diabetes Mellitus,Diabetes Care,2011,34(Suppl 1):S62-S69 and Standards of Medical Care in         Diabetes - 2011,Diabetes Care,2011,34  (Suppl 1):S11-S61.     How often do you need to have someone help you when you read instructions, pamphlets, or other written materials from your doctor or pharmacy?: 1 -  Never  Interpreter Needed?: No  Information entered by :: Jeniffer Culliver, RMA   Activities of Daily Living     09/18/2023    3:37 PM  In your present state of health, do you have any difficulty performing the following activities:  Hearing? 0  Vision? 0  Difficulty concentrating or making decisions? 0  Walking or climbing stairs? 0  Dressing or bathing? 0  Doing errands, shopping? 0  Preparing Food and eating ? N  Using the Toilet? N  In the past six months, have you accidently leaked urine? N  Do you have problems with loss of bowel control? N  Managing your Medications? N  Managing your Finances? N  Housekeeping or managing your Housekeeping? N    Patient Care Team: Rollene Almarie LABOR, MD as PCP - General (Internal Medicine) Raford Riggs, MD as PCP - Cardiology (Cardiology) Neysa Reggy BIRCH, MD as Consulting Physician (Pulmonary Disease) Maurilio Camellia PARAS, MD as Consulting Physician (Allergy  and Immunology) Raford Riggs, MD as Attending Physician (Cardiology) Alvan Dunnings, MD as Referring Physician (Neurosurgery) Arlee Ford, DMD (Dentistry) [facility] as Consulting Physician (Ophthalmology)  I have updated your Care Teams any recent Medical Services you may have received from other providers in the past year.     Assessment:   This is a routine wellness examination for [place].  Hearing/Vision screen Hearing Screening - Comments:: Denies hearing difficulties   Vision Screening - Comments:: Wears eyeglasses/ Dr. Octavia   Goals Addressed               This Visit's Progress     Patient Stated (pt-stated)        Would like to walk better/2025       Depression Screen     09/18/2023    3:43 PM 04/29/2023    4:16 PM 03/04/2023    3:21 PM 10/30/2022    2:20 PM 05/17/2022    3:08 PM 01/29/2022    4:01 PM 01/29/2022    3:56 PM  PHQ 2/9 Scores  PHQ - 2 Score 0 0 0 0 0 0 0  PHQ- 9 Score 0 0  0 0 0 0    Fall Risk     09/18/2023     3:41 PM 03/04/2023    3:15 PM 01/30/2023   12:04 PM 10/30/2022    2:19 PM 05/17/2022    3:08 PM  Fall Risk   Falls in the past year? 0 1 1 1 1   Number falls in past yr: 0 1 1 0 1  Injury with Fall? 0 0 1 1 1   Risk for fall due to : Impaired balance/gait Impaired balance/gait;History of fall(s) History of fall(s);Impaired mobility;Impaired balance/gait  History of fall(s);Impaired balance/gait;Orthopedic patient  Risk for fall due to: Comment   PT notes having feeling she had fainted during one of her falls. Also had a fall when getting up from her bed    Follow up Falls evaluation completed;Falls prevention discussed Falls evaluation completed Falls evaluation completed Falls evaluation completed Education provided;Falls  prevention discussed    MEDICARE RISK AT HOME:  Medicare Risk at Home Any stairs in or around the home?: No (1/-2) If so, are there any without handrails?: No Home free of loose throw rugs in walkways, pet beds, electrical cords, etc?: Yes Adequate lighting in your home to reduce risk of falls?: Yes Life alert?: Yes Use of a cane, walker or w/c?: Yes Grab bars in the bathroom?: Yes Shower chair or bench in shower?: Yes Elevated toilet seat or a handicapped toilet?: Yes  TIMED UP AND GO:  Was the test performed?  No  Cognitive Function: Declined/Normal: No cognitive concerns noted by patient or family. Patient alert, oriented, able to answer questions appropriately and recall recent events. No signs of memory loss or confusion.    01/28/2017    4:44 PM  MMSE - Mini Mental State Exam  Orientation to time 5   Orientation to Place 5   Registration 3   Attention/ Calculation 5   Recall 1   Language- name 2 objects 2   Language- repeat 1  Language- follow 3 step command 3   Language- read & follow direction 1   Write a sentence 1   Copy design 1   Total score 28      Data saved with a previous flowsheet row definition        05/17/2022    3:12 PM 05/15/2021     3:15 PM  6CIT Screen  What Year? 0 points 0 points  What month? 0 points 0 points  What time? 0 points 0 points  Count back from 20 0 points 0 points  Months in reverse 0 points 0 points  Repeat phrase 0 points 0 points  Total Score 0 points 0 points    Immunizations Immunization History  Administered Date(s) Administered   Fluad Quad(high Dose 65+) 11/28/2018, 11/23/2020, 01/29/2022   Fluad Trivalent(High Dose 65+) 10/30/2022   Influenza Split 10/29/2011, 10/13/2016, 11/09/2017   Influenza Whole 11/26/2006, 11/14/2007, 11/12/2009   Influenza, High Dose Seasonal PF 11/19/2012   Influenza,inj,Quad PF,6+ Mos 11/26/2014, 12/16/2014, 11/07/2015, 10/13/2019   Influenza-Unspecified 10/13/2017   Moderna Sars-Covid-2 Vaccination 02/23/2019, 03/23/2019   Pneumococcal Conjugate-13 11/07/2014   Pneumococcal Polysaccharide-23 01/18/2012   Td 02/25/2004   Tdap 12/16/2014   Zoster Recombinant(Shingrix) 05/15/2017, 08/21/2017   Zoster, Live 10/18/2005    Screening Tests Health Maintenance  Topic Date Due   Hepatitis B Vaccines (1 of 3 - Risk 3-dose series) Never done   DEXA SCAN  12/25/2021   COVID-19 Vaccine (3 - 2024-25 season) 10/14/2022   Medicare Annual Wellness (AWV)  05/17/2023   INFLUENZA VACCINE  09/13/2023   DTaP/Tdap/Td (3 - Td or Tdap) 12/15/2024   Pneumococcal Vaccine: 50+ Years  Completed   Zoster Vaccines- Shingrix  Completed   HPV VACCINES  Aged Out   Meningococcal B Vaccine  Aged Out    Health Maintenance  Health Maintenance Due  Topic Date Due   Hepatitis B Vaccines (1 of 3 - Risk 3-dose series) Never done   DEXA SCAN  12/25/2021   COVID-19 Vaccine (3 - 2024-25 season) 10/14/2022   Medicare Annual Wellness (AWV)  05/17/2023   INFLUENZA VACCINE  09/13/2023   Health Maintenance Items Addressed: DEXA ordered, See Nurse Notes at the end of this note  Additional Screening:  Vision Screening: Recommended annual ophthalmology exams for early detection of  glaucoma and other disorders of the eye. Would you like a referral to an eye doctor? No  Dental Screening: Recommended annual dental exams for proper oral hygiene  Community Resource Referral / Chronic Care Management: CRR required this visit?  No   CCM required this visit?  No   Plan:    I have personally reviewed and noted the following in the patient's chart:   Medical and social history Use of alcohol , tobacco or illicit drugs  Current medications and supplements including opioid prescriptions. Patient is not currently taking opioid prescriptions. Functional ability and status Nutritional status Physical activity Advanced directives List of other physicians Hospitalizations, surgeries, and ER visits in previous 12 months Vitals Screenings to include cognitive, depression, and falls Referrals and appointments  In addition, I have reviewed and discussed with patient certain preventive protocols, quality metrics, and best practice recommendations. A written personalized care plan for preventive services as well as general preventive health recommendations were provided to patient.   Lexie Koehl L Taijah Macrae, CMA   09/18/2023   After Visit Summary: (MyChart) Due to this being a telephonic visit, the after visit summary with patients personalized plan was offered to patient via MyChart   Notes: Patient is due for a DEXA and order has been placed today.  She is also due for a Hep B vaccine and would like to discuss with Dr. Rollene during her next office visit.  Patient stated that she will call the office to get scheduled.  Medical screening examination/treatment/procedure(s) were performed by non-physician practitioner and as supervising physician I was immediately available for consultation/collaboration.  I agree with above. Karlynn Noel, MD

## 2023-11-21 ENCOUNTER — Other Ambulatory Visit (HOSPITAL_BASED_OUTPATIENT_CLINIC_OR_DEPARTMENT_OTHER): Payer: Self-pay | Admitting: Internal Medicine

## 2023-11-21 DIAGNOSIS — G4701 Insomnia due to medical condition: Secondary | ICD-10-CM

## 2023-11-21 DIAGNOSIS — E782 Mixed hyperlipidemia: Secondary | ICD-10-CM

## 2023-11-25 ENCOUNTER — Ambulatory Visit: Payer: Self-pay

## 2023-11-25 ENCOUNTER — Other Ambulatory Visit: Payer: Self-pay | Admitting: Internal Medicine

## 2023-11-25 DIAGNOSIS — E782 Mixed hyperlipidemia: Secondary | ICD-10-CM

## 2023-11-25 NOTE — Telephone Encounter (Signed)
 FYI Only or Action Required?: FYI only for provider.  Patient was last seen in primary care on 04/29/2023 by Hailey [name] LABOR, MD.  Called Nurse Triage reporting Urinary Tract Infection.  Symptoms began several weeks ago.  Interventions attempted: Nothing.  Symptoms are: unchanged.  Triage Disposition: See Physician Within 24 Hours  Patient/caregiver understands and will follow disposition?: Yes  Copied from CRM [id number]. Topic: Clinical - Red Word Triage >> Nov 25, 2023  3:52 PM Hailey [name] wrote: Red Word that prompted transfer to Nurse Triage: Patient states she has a very painful UTI, and severe pain from neuropathy Reason for Disposition  Urinating more frequently than usual (i.e., frequency) OR new-onset of the feeling of an urgent need to urinate (i.e., urgency)  Answer Assessment - Initial Assessment Questions No available appts today. Advised [name] today and ED if symptoms worsen.  Patient reports going to Hailey [name] now.  1. SYMPTOM: What's the main symptom you're concerned about? (e.g., frequency, incontinence)     Painful, frequency, leakage 2. ONSET: When did the    start?     Couple of weeks ago 3. PAIN: Is there any pain? If Yes, ask: How bad is it? (Scale: 1-10; mild, moderate, severe)     7/10 4. CAUSE: What do you think is causing the symptoms?     uti 5. OTHER SYMPTOMS: Do you have any other symptoms? (e.g., blood in urine, fever, flank pain, pain with urination)     Sometimes lower abd pain, lower back pain, odor urine,  Denies fever, chills, n/v, bleeding  Protocols used: Urinary Symptoms-A-AH

## 2023-11-25 NOTE — Telephone Encounter (Signed)
 Overdue for visit needs for refill of controlled. Ok to refill non-controlled.

## 2023-11-25 NOTE — Telephone Encounter (Signed)
 Controlled last filled:  Ativan  04/29/23, 90 tablet 1 refill Ambien  04/29/23, 90 tablet 1 refill Tramadol -acetaminophen  04/29/23, 45 tablet 5 refill  LOV:04/29/23

## 2023-11-25 NOTE — Telephone Encounter (Unsigned)
 Copied from CRM [id number]. Topic: Clinical - Medication Refill >> Nov 25, 2023  3:49 PM Roselie C wrote: Medication: Atorvastatin  Calcium  40 mg Oral Daily    Escitalopram  Oxalate 20 mg Oral Daily   Patient needs it delivered   Has the patient contacted their pharmacy? Yes (Agent: If no, request that the patient contact the pharmacy for the refill. If patient does not wish to contact the pharmacy document the reason why and proceed with request.) (Agent: If yes, when and what did the pharmacy advise?)  This is the patient's preferred pharmacy:  [facility] [place], [place] - [address] [place] [address] Phone: [phone] Fax: [phone] Patient needs delivered   Is this the correct pharmacy for this prescription? Yes If no, delete pharmacy and type the correct one.   Has the prescription been filled recently? No  Is the patient out of the medication? Yes  Has the patient been seen for an appointment in the last year OR does the patient have an upcoming appointment? Yes  Can we respond through MyChart? Yes  Agent: Please be advised that Rx refills may take up to 3 business days. We ask that you follow-up with your pharmacy.

## 2023-11-26 ENCOUNTER — Other Ambulatory Visit: Payer: Self-pay

## 2023-11-26 DIAGNOSIS — N3000 Acute cystitis without hematuria: Secondary | ICD-10-CM | POA: Diagnosis not present

## 2023-11-26 MED ORDER — ATORVASTATIN CALCIUM 40 MG PO TABS: 40.0000 mg | ORAL_TABLET | Freq: Every day | ORAL | 0 refills | Status: AC

## 2023-11-26 MED ORDER — ESCITALOPRAM OXALATE 20 MG PO TABS: 20.0000 mg | ORAL_TABLET | Freq: Every day | ORAL | 3 refills | Status: AC

## 2023-11-27 ENCOUNTER — Other Ambulatory Visit: Payer: Self-pay | Admitting: Internal Medicine

## 2023-11-27 ENCOUNTER — Inpatient Hospital Stay: Admission: RE | Admit: 2023-11-27 | Source: Ambulatory Visit

## 2023-11-27 DIAGNOSIS — G4701 Insomnia due to medical condition: Secondary | ICD-10-CM

## 2023-11-27 NOTE — Telephone Encounter (Signed)
 Copied from CRM [id number]. Topic: Clinical - Medication Refill >> Nov 27, 2023  9:00 AM [place] M wrote: Medication: LORazepam  (ATIVAN ) 1 MG tablet ; traMADol -acetaminophen  (ULTRACET ) 37.5-325 MG tablet ;  zolpidem  (AMBIEN ) 5 MG tablet  Has the patient contacted their pharmacy? Yes (Agent: If no, request that the patient contact the pharmacy for the refill. If patient does not wish to contact the pharmacy document the reason why and proceed with request.) (Agent: If yes, when and what did the pharmacy advise?)  This is the patient's preferred pharmacy:  [facility] [place], [place] - [address] [facility] [address] [place] [address] Phone: [phone] Fax: [phone]  Is this the correct pharmacy for this prescription? Yes If no, delete pharmacy and type the correct one.   Has the prescription been filled recently? Yes  Is the patient out of the medication? Yes  Has the patient been seen for an appointment in the last year OR does the patient have an upcoming appointment? Yes  Can we respond through MyChart? Yes  Agent: Please be advised that Rx refills may take up to 3 business days. We ask that you follow-up with your pharmacy.

## 2023-11-29 ENCOUNTER — Other Ambulatory Visit: Payer: Self-pay | Admitting: Internal Medicine

## 2023-12-10 ENCOUNTER — Ambulatory Visit: Admitting: Internal Medicine

## 2023-12-12 DIAGNOSIS — N3021 Other chronic cystitis with hematuria: Secondary | ICD-10-CM | POA: Diagnosis not present

## 2023-12-26 ENCOUNTER — Other Ambulatory Visit: Payer: Self-pay | Admitting: Internal Medicine

## 2023-12-30 DIAGNOSIS — N3021 Other chronic cystitis with hematuria: Secondary | ICD-10-CM | POA: Diagnosis not present

## 2023-12-30 DIAGNOSIS — K8689 Other specified diseases of pancreas: Secondary | ICD-10-CM | POA: Diagnosis not present

## 2023-12-30 DIAGNOSIS — K8020 Calculus of gallbladder without cholecystitis without obstruction: Secondary | ICD-10-CM | POA: Diagnosis not present

## 2024-01-01 DIAGNOSIS — N3020 Other chronic cystitis without hematuria: Secondary | ICD-10-CM | POA: Diagnosis not present

## 2024-01-01 DIAGNOSIS — N952 Postmenopausal atrophic vaginitis: Secondary | ICD-10-CM | POA: Diagnosis not present

## 2024-01-02 DIAGNOSIS — N3020 Other chronic cystitis without hematuria: Secondary | ICD-10-CM | POA: Diagnosis not present

## 2024-01-06 DIAGNOSIS — M810 Age-related osteoporosis without current pathological fracture: Secondary | ICD-10-CM | POA: Diagnosis not present

## 2024-01-06 DIAGNOSIS — E782 Mixed hyperlipidemia: Secondary | ICD-10-CM | POA: Diagnosis not present

## 2024-01-06 DIAGNOSIS — G629 Polyneuropathy, unspecified: Secondary | ICD-10-CM | POA: Diagnosis not present

## 2024-01-06 DIAGNOSIS — I5032 Chronic diastolic (congestive) heart failure: Secondary | ICD-10-CM | POA: Diagnosis not present

## 2024-01-08 ENCOUNTER — Other Ambulatory Visit (HOSPITAL_COMMUNITY): Payer: Self-pay | Admitting: Vascular Surgery

## 2024-01-08 ENCOUNTER — Inpatient Hospital Stay
Admission: RE | Admit: 2024-01-08 | Discharge: 2024-01-08 | Disposition: A | Payer: Self-pay | Source: Ambulatory Visit | Attending: Vascular Surgery

## 2024-01-08 DIAGNOSIS — I722 Aneurysm of renal artery: Secondary | ICD-10-CM

## 2024-02-18 ENCOUNTER — Telehealth: Payer: Self-pay

## 2024-02-18 NOTE — Telephone Encounter (Signed)
 Auth Submission: NO AUTH NEEDED Site of care: Site of care: [facility] Payer: Medicare A/B Medication & CPT/J Code(s) submitted: Prolia  (Denosumab ) [id number] Diagnosis Code:  Route of submission (phone, fax, portal):  Phone # Fax # Auth type: Buy/Bill PB Units/visits requested: 60mg  x 2 doses Reference number:  Approval from: 02/18/24 to 03/14/25

## 2024-03-05 ENCOUNTER — Ambulatory Visit

## 2024-03-05 VITALS — BP 112/76 | HR 89 | Temp 97.5°F | Resp 16 | Ht 67.0 in | Wt 125.6 lb

## 2024-03-05 DIAGNOSIS — M810 Age-related osteoporosis without current pathological fracture: Secondary | ICD-10-CM

## 2024-03-05 MED ORDER — DENOSUMAB 60 MG/ML ~~LOC~~ SOSY
60.0000 mg | PREFILLED_SYRINGE | Freq: Once | SUBCUTANEOUS | Status: AC
  Administered 2024-03-05: 60 mg via SUBCUTANEOUS
  Filled 2024-03-05: qty 1

## 2024-03-05 NOTE — Progress Notes (Signed)
 Diagnosis: , Osteoporosis  Provider:  Mannam, Praveen MD  Procedure: Injection  Prolia  (Denosumab ), Dose: 60 mg, Site: subcutaneous, Number of injections: 1  Injection Site(s): Left lower quad. abdomen  Post Care: Injection  Discharge: Condition: Good, Destination: Home . AVS Declined  Performed by:  Donny Childes, RN

## 2024-09-02 ENCOUNTER — Ambulatory Visit

## 2024-09-18 ENCOUNTER — Ambulatory Visit
